# Patient Record
Sex: Male | Born: 1945 | Race: White | Hispanic: No | Marital: Married | Smoking: Never smoker
Health system: Southern US, Community
[De-identification: ages and names within clinical notes are randomized; demographics above are authoritative.]

## PROBLEM LIST (undated history)

## (undated) DIAGNOSIS — R339 Retention of urine, unspecified: Secondary | ICD-10-CM

## (undated) DIAGNOSIS — Z960 Presence of urogenital implants: Secondary | ICD-10-CM

## (undated) DIAGNOSIS — Z978 Presence of other specified devices: Secondary | ICD-10-CM

## (undated) DIAGNOSIS — G20A1 Parkinson's disease without dyskinesia, without mention of fluctuations: Secondary | ICD-10-CM

## (undated) DIAGNOSIS — Z973 Presence of spectacles and contact lenses: Secondary | ICD-10-CM

## (undated) DIAGNOSIS — I639 Cerebral infarction, unspecified: Secondary | ICD-10-CM

## (undated) DIAGNOSIS — N400 Enlarged prostate without lower urinary tract symptoms: Secondary | ICD-10-CM

## (undated) DIAGNOSIS — K219 Gastro-esophageal reflux disease without esophagitis: Secondary | ICD-10-CM

## (undated) DIAGNOSIS — G4762 Sleep related leg cramps: Secondary | ICD-10-CM

## (undated) HISTORY — PX: CATARACT EXTRACTION W/ INTRAOCULAR LENS  IMPLANT, BILATERAL: SHX1307

## (undated) HISTORY — PX: INGUINAL HERNIA REPAIR: SUR1180

## (undated) HISTORY — DX: Parkinson's disease without dyskinesia, without mention of fluctuations: G20.A1

---

## 1999-08-16 HISTORY — PX: OTHER SURGICAL HISTORY: SHX169

## 2000-07-17 ENCOUNTER — Ambulatory Visit (HOSPITAL_COMMUNITY): Admission: RE | Admit: 2000-07-17 | Discharge: 2000-07-17 | Payer: Self-pay | Admitting: General Surgery

## 2003-06-27 ENCOUNTER — Ambulatory Visit (HOSPITAL_COMMUNITY): Admission: RE | Admit: 2003-06-27 | Discharge: 2003-06-27 | Payer: Self-pay | Admitting: Gastroenterology

## 2013-08-29 HISTORY — PX: OTHER SURGICAL HISTORY: SHX169

## 2013-09-17 ENCOUNTER — Other Ambulatory Visit: Payer: Self-pay | Admitting: Urology

## 2013-09-17 ENCOUNTER — Encounter (HOSPITAL_BASED_OUTPATIENT_CLINIC_OR_DEPARTMENT_OTHER): Payer: Self-pay | Admitting: *Deleted

## 2013-09-17 NOTE — Progress Notes (Signed)
NPO AFTER MN. ARRIVE AT 0600. NEEDS HG. PT WITH RECENT LEFT EYE SURGERY WILL WEAR EYE PATCH DURING SURGERY. REVIEWED [facility] GUIDELINES, WILL BRING MEDS.

## 2013-09-22 ENCOUNTER — Encounter (HOSPITAL_BASED_OUTPATIENT_CLINIC_OR_DEPARTMENT_OTHER): Payer: Self-pay | Admitting: Anesthesiology

## 2013-09-22 NOTE — Anesthesia Preprocedure Evaluation (Signed)
Anesthesia Evaluation  Patient identified by MRN, date of birth, ID band Patient awake    Reviewed: Allergy & Precautions, H&P , NPO status , Patient's Chart, lab work & pertinent test results  Airway Mallampati: II TM Distance: >3 FB Neck ROM: Full    Dental no notable dental hx.    Pulmonary neg pulmonary ROS,  breath sounds clear to auscultation  Pulmonary exam normal       Cardiovascular Exercise Tolerance: Good negative cardio ROS  Rhythm:Regular Rate:Normal     Neuro/Psych negative neurological ROS  negative psych ROS   GI/Hepatic negative GI ROS, Neg liver ROS,   Endo/Other  negative endocrine ROS  Renal/GU negative Renal ROS  negative genitourinary   Musculoskeletal negative musculoskeletal ROS (+)   Abdominal   Peds negative pediatric ROS (+)  Hematology negative hematology ROS (+)   Anesthesia Other Findings   Reproductive/Obstetrics negative OB ROS                           Anesthesia Physical Anesthesia Plan  ASA: II  Anesthesia Plan: General   Post-op Pain Management:    Induction: Intravenous  Airway Management Planned: LMA  Additional Equipment:   Intra-op Plan:   Post-operative Plan: Extubation in OR  Informed Consent: I have reviewed the patients History and Physical, chart, labs and discussed the procedure including the risks, benefits and alternatives for the proposed anesthesia with the patient or authorized representative who has indicated his/her understanding and acceptance.   Dental advisory given  Plan Discussed with: CRNA  Anesthesia Plan Comments:         Anesthesia Quick Evaluation

## 2013-09-23 ENCOUNTER — Ambulatory Visit (HOSPITAL_BASED_OUTPATIENT_CLINIC_OR_DEPARTMENT_OTHER): Payer: Medicare Other | Admitting: Anesthesiology

## 2013-09-23 ENCOUNTER — Encounter (HOSPITAL_BASED_OUTPATIENT_CLINIC_OR_DEPARTMENT_OTHER): Payer: Self-pay | Admitting: *Deleted

## 2013-09-23 ENCOUNTER — Ambulatory Visit (HOSPITAL_BASED_OUTPATIENT_CLINIC_OR_DEPARTMENT_OTHER)
Admission: RE | Admit: 2013-09-23 | Discharge: 2013-09-24 | Disposition: A | Discharge status: Home / Self Care | Payer: Medicare Other | Source: Ambulatory Visit | Attending: Urology | Admitting: Urology

## 2013-09-23 ENCOUNTER — Encounter (HOSPITAL_BASED_OUTPATIENT_CLINIC_OR_DEPARTMENT_OTHER): Payer: Medicare Other | Admitting: Anesthesiology

## 2013-09-23 ENCOUNTER — Encounter (HOSPITAL_BASED_OUTPATIENT_CLINIC_OR_DEPARTMENT_OTHER)
Admission: RE | Disposition: A | Discharge status: Home / Self Care | Payer: Self-pay | Source: Ambulatory Visit | Attending: Urology

## 2013-09-23 DIAGNOSIS — Z79899 Other long term (current) drug therapy: Secondary | ICD-10-CM | POA: Insufficient documentation

## 2013-09-23 DIAGNOSIS — R338 Other retention of urine: Secondary | ICD-10-CM

## 2013-09-23 DIAGNOSIS — N138 Other obstructive and reflux uropathy: Secondary | ICD-10-CM | POA: Insufficient documentation

## 2013-09-23 DIAGNOSIS — R339 Retention of urine, unspecified: Secondary | ICD-10-CM | POA: Insufficient documentation

## 2013-09-23 DIAGNOSIS — N401 Enlarged prostate with lower urinary tract symptoms: Secondary | ICD-10-CM | POA: Diagnosis present

## 2013-09-23 DIAGNOSIS — K219 Gastro-esophageal reflux disease without esophagitis: Secondary | ICD-10-CM | POA: Insufficient documentation

## 2013-09-23 DIAGNOSIS — R972 Elevated prostate specific antigen [PSA]: Secondary | ICD-10-CM | POA: Insufficient documentation

## 2013-09-23 DIAGNOSIS — R351 Nocturia: Secondary | ICD-10-CM | POA: Insufficient documentation

## 2013-09-23 HISTORY — DX: Retention of urine, unspecified: R33.9

## 2013-09-23 HISTORY — DX: Presence of other specified devices: Z97.8

## 2013-09-23 HISTORY — DX: Presence of urogenital implants: Z96.0

## 2013-09-23 HISTORY — DX: Benign prostatic hyperplasia without lower urinary tract symptoms: N40.0

## 2013-09-23 HISTORY — DX: Presence of spectacles and contact lenses: Z97.3

## 2013-09-23 HISTORY — DX: Sleep related leg cramps: G47.62

## 2013-09-23 HISTORY — PX: TRANSURETHRAL RESECTION OF PROSTATE: SHX73

## 2013-09-23 LAB — POCT HEMOGLOBIN-HEMACUE: Hemoglobin: 15.1 g/dL (ref 13.0–17.0)

## 2013-09-23 SURGERY — TURP (TRANSURETHRAL RESECTION OF PROSTATE)
Anesthesia: General | Site: Prostate

## 2013-09-23 MED ORDER — FENTANYL CITRATE 0.05 MG/ML IJ SOLN
INTRAMUSCULAR | Status: AC
  Filled 2013-09-23: qty 2

## 2013-09-23 MED ORDER — BELLADONNA ALKALOIDS-OPIUM 16.2-60 MG RE SUPP
1.0000 | Freq: Four times a day (QID) | RECTAL | Status: DC | PRN
  Filled 2013-09-23: qty 1

## 2013-09-23 MED ORDER — SODIUM CHLORIDE 0.9 % IR SOLN
Freq: Once | Status: DC
  Filled 2013-09-23 (×2): qty 3000

## 2013-09-23 MED ORDER — BELLADONNA ALKALOIDS-OPIUM 16.2-60 MG RE SUPP
RECTAL | Status: DC | PRN
  Administered 2013-09-23: 1 via RECTAL

## 2013-09-23 MED ORDER — PROMETHAZINE HCL 25 MG/ML IJ SOLN
6.2500 mg | INTRAMUSCULAR | Status: DC | PRN
  Filled 2013-09-23: qty 1

## 2013-09-23 MED ORDER — ONDANSETRON HCL 4 MG/2ML IJ SOLN
INTRAMUSCULAR | Status: DC | PRN
  Administered 2013-09-23: 4 mg via INTRAVENOUS

## 2013-09-23 MED ORDER — SODIUM CHLORIDE 0.9 % IR SOLN
Status: DC | PRN
  Administered 2013-09-23: 6000 mL

## 2013-09-23 MED ORDER — SODIUM CHLORIDE 0.9 % IR SOLN
Status: DC | PRN
  Administered 2013-09-23: 12000 mL

## 2013-09-23 MED ORDER — SODIUM CHLORIDE 0.9 % IV SOLN
INTRAVENOUS | Status: DC
  Administered 2013-09-23 – 2013-09-24 (×2): via INTRAVENOUS
  Filled 2013-09-23: qty 1000

## 2013-09-23 MED ORDER — STERILE WATER FOR IRRIGATION IR SOLN
Status: DC | PRN
  Administered 2013-09-23: 30 mL

## 2013-09-23 MED ORDER — BELLADONNA ALKALOIDS-OPIUM 16.2-60 MG RE SUPP
RECTAL | Status: AC
  Filled 2013-09-23: qty 1

## 2013-09-23 MED ORDER — PROPOFOL 10 MG/ML IV BOLUS
INTRAVENOUS | Status: DC | PRN
  Administered 2013-09-23: 200 mg via INTRAVENOUS

## 2013-09-23 MED ORDER — HYDROCODONE-ACETAMINOPHEN 5-325 MG PO TABS
1.0000 | ORAL_TABLET | ORAL | Status: DC | PRN
  Filled 2013-09-23: qty 2

## 2013-09-23 MED ORDER — CIPROFLOXACIN IN D5W 400 MG/200ML IV SOLN
400.0000 mg | Freq: Two times a day (BID) | INTRAVENOUS | Status: AC
  Filled 2013-09-23: qty 200

## 2013-09-23 MED ORDER — BACITRACIN-NEOMYCIN-POLYMYXIN 400-5-5000 EX OINT
1.0000 "application " | TOPICAL_OINTMENT | Freq: Three times a day (TID) | CUTANEOUS | Status: DC | PRN
  Filled 2013-09-23: qty 1

## 2013-09-23 MED ORDER — CIPROFLOXACIN IN D5W 400 MG/200ML IV SOLN
400.0000 mg | INTRAVENOUS | Status: AC
Start: 2013-09-23 — End: 2013-09-23
  Administered 2013-09-23: 400 mg via INTRAVENOUS
  Filled 2013-09-23: qty 200

## 2013-09-23 MED ORDER — FENTANYL CITRATE 0.05 MG/ML IJ SOLN
25.0000 ug | INTRAMUSCULAR | Status: DC | PRN
  Filled 2013-09-23: qty 1

## 2013-09-23 MED ORDER — ACETAMINOPHEN 325 MG PO TABS
650.0000 mg | ORAL_TABLET | ORAL | Status: DC | PRN
  Filled 2013-09-23: qty 2

## 2013-09-23 MED ORDER — LIDOCAINE HCL (CARDIAC) 20 MG/ML IV SOLN
INTRAVENOUS | Status: DC | PRN
  Administered 2013-09-23: 80 mg via INTRAVENOUS

## 2013-09-23 MED ORDER — 0.9 % SODIUM CHLORIDE (POUR BTL) OPTIME
TOPICAL | Status: DC | PRN
  Administered 2013-09-23: 2000 mL

## 2013-09-23 MED ORDER — LACTATED RINGERS IV SOLN
INTRAVENOUS | Status: DC
  Administered 2013-09-23 (×2): via INTRAVENOUS
  Filled 2013-09-23: qty 1000

## 2013-09-23 MED ORDER — ACETAMINOPHEN 10 MG/ML IV SOLN
INTRAVENOUS | Status: DC | PRN
  Administered 2013-09-23: 1000 mg via INTRAVENOUS

## 2013-09-23 MED ORDER — DEXAMETHASONE SODIUM PHOSPHATE 4 MG/ML IJ SOLN
INTRAMUSCULAR | Status: DC | PRN
  Administered 2013-09-23: 10 mg via INTRAVENOUS

## 2013-09-23 MED ORDER — ONDANSETRON HCL 4 MG/2ML IJ SOLN
4.0000 mg | INTRAMUSCULAR | Status: DC | PRN
  Filled 2013-09-23: qty 2

## 2013-09-23 MED ORDER — FENTANYL CITRATE 0.05 MG/ML IJ SOLN
INTRAMUSCULAR | Status: DC | PRN
  Administered 2013-09-23: 50 ug via INTRAVENOUS
  Administered 2013-09-23 (×2): 25 ug via INTRAVENOUS

## 2013-09-23 MED ORDER — ZOLPIDEM TARTRATE 5 MG PO TABS
5.0000 mg | ORAL_TABLET | Freq: Every evening | ORAL | Status: DC | PRN
  Filled 2013-09-23: qty 1

## 2013-09-23 SURGICAL SUPPLY — 37 items
BAG DRAIN URO-CYSTO SKYTR STRL (DRAIN) ×2 IMPLANT
BAG URINE DRAINAGE (UROLOGICAL SUPPLIES) IMPLANT
BAG URINE LEG 19OZ MD ST LTX (BAG) IMPLANT
CANISTER SUCT LVC 12 LTR MEDI- (MISCELLANEOUS) ×4 IMPLANT
CATH FOLEY 3WAY 30CC 24FR (CATHETERS) ×1
CATH HEMA 3WAY 30CC 24FR COUDE (CATHETERS) IMPLANT
CATH HEMA 3WAY 30CC 24FR RND (CATHETERS) IMPLANT
CATH URTH STD 24FR FL 3W 2 (CATHETERS) ×1 IMPLANT
CLOTH BEACON ORANGE TIMEOUT ST (SAFETY) ×2 IMPLANT
DRAPE CAMERA CLOSED 9X96 (DRAPES) ×2 IMPLANT
ELECT BUTTON BIOP 24F 90D PLAS (MISCELLANEOUS) IMPLANT
ELECT BUTTON HF 24-28F 2 30DE (ELECTRODE) IMPLANT
ELECT LOOP HF 26F 30D .35MM (CUTTING LOOP) IMPLANT
ELECT LOOP MED HF 24F 12D (CUTTING LOOP) IMPLANT
ELECT LOOP MED HF 24F 12D CBL (CLIP) ×2 IMPLANT
ELECT REM PT RETURN 9FT ADLT (ELECTROSURGICAL) ×2
ELECTRODE REM PT RTRN 9FT ADLT (ELECTROSURGICAL) ×1 IMPLANT
EVACUATOR MICROVAS BLADDER (UROLOGICAL SUPPLIES) ×2 IMPLANT
GLOVE BIO SURGEON STRL SZ8 (GLOVE) ×2 IMPLANT
GLOVE BIOGEL M STER SZ 6 (GLOVE) ×2 IMPLANT
GLOVE BIOGEL PI IND STRL 6.5 (GLOVE) ×1 IMPLANT
GLOVE BIOGEL PI INDICATOR 6.5 (GLOVE) ×1
GOWN PREVENTION PLUS LG XLONG (DISPOSABLE) ×4 IMPLANT
GOWN STRL REIN XL XLG (GOWN DISPOSABLE) IMPLANT
GOWN STRL REUS W/TWL LRG LVL3 (GOWN DISPOSABLE) ×2 IMPLANT
GOWN STRL REUS W/TWL XL LVL3 (GOWN DISPOSABLE) ×2 IMPLANT
HOLDER FOLEY CATH W/STRAP (MISCELLANEOUS) ×2 IMPLANT
IV NS 1000ML (IV SOLUTION) ×4
IV NS 1000ML BAXH (IV SOLUTION) ×4 IMPLANT
IV NS IRRIG 3000ML ARTHROMATIC (IV SOLUTION) ×2 IMPLANT
LOOP CUTTING 24FR OLYMPUS (CUTTING LOOP) IMPLANT
PACK CYSTOSCOPY (CUSTOM PROCEDURE TRAY) ×2 IMPLANT
PLUG CATH AND CAP STER (CATHETERS) IMPLANT
SET ASPIRATION TUBING (TUBING) ×2 IMPLANT
SYR 30ML LL (SYRINGE) ×2 IMPLANT
SYRINGE IRR TOOMEY STRL 70CC (SYRINGE) ×2 IMPLANT
WATER STERILE IRR 3000ML UROMA (IV SOLUTION) ×2 IMPLANT

## 2013-09-23 NOTE — Transfer of Care (Signed)
Immediate Anesthesia Transfer of Care Note  Patient: Joshua Browning  Procedure(s) Performed: Procedure(s): TRANSURETHRAL RESECTION OF THE PROSTATE (TURP) WITH GYRUS (N/A)  Patient Location: PACU  Anesthesia Type:General  Level of Consciousness: sedated and responds to stimulation  Airway & Oxygen Therapy: Patient Spontanous Breathing and Patient connected to nasal cannula oxygen  Post-op Assessment: Report given to PACU RN  Post vital signs: Reviewed and stable  Complications: No apparent anesthesia complications

## 2013-09-23 NOTE — H&P (Signed)
Joshua Browning is a 68 year old male with urinary retention.   History of Present Illness      Gross hematuria: He noted blood spotting in his underwear. He describes the bloody spot as almost a bloody, mucoid type material that is about the size of a nickel. He indicates that he has not had an ejaculation so does not know if he has any blood in his semen. He said it only occurs at night. He's never seen any grossly bloody urine and denies any flank pain, kidney stones or history of UTIs or prostatitis.    History of BPH with outlet obstruction: He started Cardura for this in 1996. He then switched to Flomax and eventually to Rapaflo in 2011. He was voiding without difficulty until approximately 1/13 when he began to have nocturia 2-4 times. When he started the Rapaflo he said he was only getting up once at night and now that has increased.   Cystoscopy 6/13 - No abnormality of the urethra. The prostatic urethra was elongated with trilobar hypertrophy and a generous median lobe component as well. The bladder was highly trabeculated.  Current treatment: Rapaflo 8 mg    LUTS: He began experiencing frequency and urgency in 12/13. At time he was on oxybutynin 5 mg b.i.d. I switched him to Vesicare 10 mg.    Elevated PSA: He had a transient elevation of his PSA which increased from 2.93 in 12/10 to 3.66 in 1/12. He was rechecked in 7/12 and had fallen to 2.24.    Interval history: He has had a Foley catheter in and has undergone urodynamics to evaluate his bladder further.     He was seen on 08/20/13 for routine yearly visit and reported the Vesicare seemed to have lost its effectiveness over the past few months. He had daytime frequency every 1.5-2 hours with nocturia 5-6 times and despite feeling as if he emptied completely his PVR was found to be 952 cc. An ultrasound was obtained which revealed right hydronephrosis. The catheter was placed and 1230 cc drained from the bladder. His Vesicare was  stopped and he was maintained on Rapaflo. A PSA drawn on 08/13/13 was normal at 1.62.       Past Medical History Problems  1. History of Elevated prostate specific antigen (PSA) (790.93) 2. History of esophageal reflux (V12.79)  Surgical History Problems  1. History of Cataract Surgery 2. History of Eye Surgery 3. History of Inguinal Hernia Repair 4. History of Inguinal Hernia Repair  Current Meds 1. Magnesium Oxide 400 MG Oral Tablet;  Therapy: (P2138233) to Recorded 2. Multi-Day TABS;  Therapy: (Recorded:04Jun2013) to Recorded 3. PrednisoLONE Acetate 1 % Ophthalmic Suspension;  Therapy: (P2138233) to Recorded 4. PriLOSEC 20 MG Oral Capsule Delayed Release;  Therapy: (Recorded:04Jun2013) to Recorded 5. Rapaflo 8 MG Oral Capsule;  Therapy: (Recorded:04Jun2013) to Recorded 6. Vitamin D TABS;  Therapy: (Recorded:04Jun2013) to Recorded  Allergies Medication  1. No Known Drug Allergies  Family History Problems  1. Family history of Diabetes Mellitus (V18.0) 2. Family history of Heart Disease (V17.49) 3. No pertinent family history : Mother  Social History Problems  1. Denied: History of Alcohol Use 2. Caffeine Use 3. Marital History - Currently Married 4. Never A Smoker 5. Occupation: Retired  Electronics engineer, constitutional, skin, eye, otolaryngeal, hematologic/lymphatic, cardiovascular, pulmonary, endocrine, musculoskeletal, gastrointestinal, neurological and psychiatric system(s) were reviewed and pertinent findings if present are noted.  Genitourinary: urinary frequency, feelings of urinary urgency, nocturia, difficulty starting the urinary stream, weak urinary  stream and erectile dysfunction.  Gastrointestinal: heartburn.  Musculoskeletal: joint pain.    Vitals Vital Signs Height: 5 ft 11 in Weight: 172 lb  BMI Calculated: 23.99 BSA Calculated: 1.98 Blood Pressure: 133 / 87 Heart Rate: 77  Physical  Exam Constitutional: Well nourished and well developed . No acute distress.  ENT:. The ears and nose are normal in appearance.  Neck: The appearance of the neck is normal and no neck mass is present.  Pulmonary: No respiratory distress and normal respiratory rhythm and effort.  Cardiovascular: Heart rate and rhythm are normal . No peripheral edema.  Abdomen: The abdomen is soft and nontender. No masses are palpated. No CVA tenderness. No hernias are palpable. No hepatosplenomegaly noted.  Rectal: Rectal exam demonstrates normal sphincter tone, no tenderness and no masses. Estimated prostate size is 3+. The prostate has a palpable nodule involving the left of the prostate and is not tender. The left seminal vesicle is nonpalpable. The right seminal vesicle is nonpalpable. The perineum is normal on inspection.  Genitourinary: Examination of the penis demonstrates no discharge with foley catheter indwelling, no masses, no lesions. The penis is circumcised. The scrotum is without lesions. The right epididymis is palpably normal and non-tender. The left epididymis is palpably normal and non-tender. The right testis is non-tender and without masses. The left testis is non-tender and without masses.  Lymphatics: The femoral and inguinal nodes are not enlarged or tender.  Skin: Normal skin turgor, no visible rash and no visible skin lesions.  Neuro/Psych:. Mood and affect are appropriate.   Results/Data Urodynamics 09/10/13: Study was performed to evaluate urinary retention and an elevated bladder capacity.  His full urodynamic study was reviewed in its entirety as noted in the chart.    CMG: In the maximum cystometric capacity of 667 cc with some slight loss of compliance. He had an end filling pressure of 18 cm H2O, a normal first sensation at 291 cc and detrusor instability initially noted at 388 cc with an unstable contraction pressure of 28 cm H2O. No leakage occurred.  Pressure-flow: He was able  to generate a voluntary contraction but was unable to void with a maximum detrusor pressure of 62 cm H2O. His voluntary contractions were not well sustained.  EMG: He did have some increase in EMG activity during his attempt to void.  Fluoroscopy: He had fairly significant trabeculation with cellule formation.    Impression: He has an elevated capacity with some instability and loss of compliance and a poorly sustained detrusor contraction that was up to 62 cm H2O and appeared he was voiding against a closed bladder neck. He has evidence of long-standing outlet obstruction. He very well may respond to outlet resistance surgery although he is at an increased risk for developing urge incontinence.     Assessment Assessed  1. Incomplete bladder emptying (788.21)  We went over the results of the urodynamics study today. I discussed each of the findings of the study and the normal/anticipated results as well as the significance of these results in relation to the subjective symptoms.  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 He did have uninhibited contractions which have been managed with anticholinergic therapy in the past. We discussed the treatment options and he does not want to maintain catheter in the bladder either through the penis or suprapubic. He also does  not want to perform intermittent self-catheterization. I therefore discussed transurethral resection of the prostate with him. I have gone over the procedure with detail and drew him pictures. I have discussed with he and his wife the fact that he has a detrusor contraction and very likely will void spontaneously. There is a small risk that he may not void and required a catheter for some time after the surgery. The greater risk is that of urge incontinence developing after I reduce his outlet  resistance. We discussed using medication to manage this if it occurs although I think the likelihood of that is low as well. I went over the need to stay in the hospital overnight, the probability of success and the anticipated postoperative hospital course. All of his and his wife's questions were answered to their satisfaction and he has elected to proceed with surgery.   Plan 1. A urine specimen was obtained from his catheter on 09/12/13 for culture and was negative.  2. He'll be scheduled for transurethral resection of his prostate.  3. He just underwent left eye surgery and will need a protective eye shield in place at the time of surgery.

## 2013-09-23 NOTE — Anesthesia Procedure Notes (Signed)
Procedure Name: LMA Insertion Date/Time: 09/23/2013 7:28 AM Performed by: Bethena Roys T Pre-anesthesia Checklist: Patient identified, Emergency Drugs available, Suction available and Patient being monitored Patient Re-evaluated:Patient Re-evaluated prior to inductionOxygen Delivery Method: Circle System Utilized Preoxygenation: Pre-oxygenation with 100% oxygen Intubation Type: IV induction Ventilation: Mask ventilation without difficulty LMA: LMA inserted LMA Size: 5.0 Number of attempts: 1 Airway Equipment and Method: bite block Placement Confirmation: positive ETCO2 Dental Injury: Teeth and Oropharynx as per pre-operative assessment

## 2013-09-23 NOTE — Op Note (Signed)
PATIENT:  Joshua Browning  PRE-OPERATIVE DIAGNOSIS: BPH with urinary retention  POST-OPERATIVE DIAGNOSIS: Same  PROCEDURE: TURP  SURGEON:  Claybon Jabs  INDICATION: Joshua Browning is a 68 year old male who developed urinary retention and was found to have 1230 cc in the bladder. A Foley catheter was placed and he was maintained on alpha blockade therapy but continued to be unable to void. He underwent urodynamic evaluation which revealed some detrusor instability but also significant outlet obstruction. We discussed the treatment options and he would like to be catheter free. We therefore discussed transurethral resection of the prostate with the understanding that he may have some bladder overactivity that may require anticholinergic therapy postoperatively.  ANESTHESIA:  General  EBL:  Minimal  DRAINS: 79 [place], three-way Foley catheter  LOCAL MEDICATIONS USED:  None  SPECIMEN:  Prostate chips to pathology  Description of procedure: After informed consent the patient was taken to the operating room and placed on the table in a supine position. General anesthesia was then administered. Once fully anesthetized the patient was moved to the dorsal lithotomy position and the genitalia were sterilely prepped and draped in standard fashion. An official timeout was then performed.  The 100 French resectoscope with visual obturator and 12 lens was then passed under direct vision down the urethra which was noted be normal. The prostatic urethra revealed trilobar hypertrophy. The bladder was entered and fully inspected and noted to be free of any tumors, stones or inflammatory lesions. It did have significant trabeculation with cellule formation. The ureteral orifices were noted to be well away from the bladder neck.  The resectoscope element was then inserted and I began resecting the prostate from the level of the bladder neck back to the veru first at the 6:00 position. I then resected the  right and left lobes inferiorly. The remainder of the left lobe was then resected down to the surgical capsule with electrocautery used to control bleeding points. He had a highly vascular prostate. The right lobe was then resected in a similar fashion and I resected anterior tissue as well as apical tissue with care being taken to remain proximal to the purulent all times. The Microvasive evacuator was then used to remove all prostatic chips from the bladder and the bladder reinspected. All chips had been removed, the bladder was completely intact and ureteral orifices were identified and remained well away from the area of resection. I therefore removed the resectoscope and inserted a 20 French Foley catheter. This was irrigated with mild traction and the irrigant returned light pink. It was placed on traction and maintained on continuous bladder irrigation and the patient received a B&O suppository. He was then awakened and taken to the recovery room in stable and satisfactory condition. He tolerated the procedure well no intraoperative complications.  PLAN OF CARE: Observation overnight with anticipated discharge in the morning.  PATIENT DISPOSITION:  PACU - hemodynamically stable.

## 2013-09-23 NOTE — Anesthesia Postprocedure Evaluation (Signed)
  Anesthesia Post-op Note  Patient: Joshua Browning  Procedure(s) Performed: Procedure(s) (LRB): TRANSURETHRAL RESECTION OF THE PROSTATE (TURP) WITH GYRUS (N/A)  Patient Location: PACU  Anesthesia Type: General  Level of Consciousness: awake and alert   Airway and Oxygen Therapy: Patient Spontanous Breathing  Post-op Pain: mild  Post-op Assessment: Post-op Vital signs reviewed, Patient's Cardiovascular Status Stable, Respiratory Function Stable, Patent Airway and No signs of Nausea or vomiting  Last Vitals:  Filed Vitals:   09/23/13 0951  BP: 112/71  Pulse: 61  Temp: 36.1 C  Resp: 16    Post-op Vital Signs: stable   Complications: No apparent anesthesia complications

## 2013-09-23 NOTE — Progress Notes (Signed)
Received into RCC#1 via stretcher.  Transferred to bed w/ assistance.  Pt a&o. Vss.  Oriented to room and unit. Guidelines reviewed.  Call bell w/in reach.  Orders reviewed w pt.

## 2013-09-23 NOTE — Progress Notes (Signed)
Night of surgery note  Subjective: The patient is doing well.  No complaints. In fact he was quite surprised that he was not having any pain from the surgery.  Objective: Vital signs in last 24 hours: Temp:  [96.8 F (36 C)-97 F (36.1 C)] 97 F (36.1 C) (02/09 1157) Pulse Rate:  [61-74] 74 (02/09 1157) Resp:  [9-21] 14 (02/09 1157) BP: (96-134)/(58-84) 96/58 mmHg (02/09 1157) SpO2:  [96 %-100 %] 97 % (02/09 1157) Weight:  [78.019 kg (172 lb)] 78.019 kg (172 lb) (02/09 0617)    Intake/Output this shift: Total I/O In: 1610 [P.O.:720; I.V.:1300; Other:1400] Out: 2050 [Urine:2050]  Physical Exam:  His abdomen is soft, flat and nontender.  There are no masses. His catheter is on mild traction and draining pink urine without clots.  Lab Results:  Recent Labs  09/23/13 0637  HGB 15.1    Assessment/Plan: 1) Continue to monitor 2) Per orders   Carlean Crowl C. Karsten Ro, MD  Claybon Jabs 09/23/2013, 2:03 PM

## 2013-09-23 NOTE — Discharge Instructions (Signed)

## 2013-09-24 ENCOUNTER — Encounter (HOSPITAL_BASED_OUTPATIENT_CLINIC_OR_DEPARTMENT_OTHER): Payer: Self-pay | Admitting: Urology

## 2013-09-24 LAB — HEMOGLOBIN AND HEMATOCRIT, BLOOD
HCT: 38.4 % — ABNORMAL LOW (ref 39.0–52.0)
Hemoglobin: 13.1 g/dL (ref 13.0–17.0)

## 2013-09-24 MED ORDER — PHENAZOPYRIDINE HCL 200 MG PO TABS: 200.0000 mg | ORAL_TABLET | Freq: Three times a day (TID) | ORAL | Status: DC | PRN

## 2013-09-24 NOTE — Discharge Summary (Signed)
Physician Discharge Summary  Patient ID: Joshua Browning MRN: [id number] DOB/AGE: 68-Oct-1947 68 y.o.  Admit date: 09/23/2013 Discharge date: 09/24/2013  Admission Diagnoses: BPH WITH URINARY RETENTION  Discharge Diagnoses:  Principal Problem:   BPH (benign prostatic hypertrophy) with urinary retention   Discharged Condition: good  Hospital Course: Developing urinary retention and not be able to void despite alpha blockade therapy he underwent urodynamics. This revealed he did have an adequate detrusor contraction with outlet obstruction and therefore he was admitted for elective transurethral resection of his prostate. This was undertaken and tolerated well with no complications. He was maintained on continuous bladder irrigation overnight and his urine cleared. His catheter has been removed. His hemoglobin has decreased just slightly however there is no active bleeding and this is most likely secondary to some intraoperative blood loss as well as dilution with hydration. At this point he is felt to be ready for discharge. He is having no pain.  Consults: None  Significant Diagnostic Studies: No results found.   Discharge Exam: Blood pressure 107/65, pulse 72, temperature 97.3 F (36.3 C), temperature source Oral, resp. rate 18, height 5' 10.25" (1.784 m), weight 78.019 kg (172 lb), SpO2 96.00%. General appearance: alert, cooperative and no distress GI: normal findings: no masses palpable Male genitalia: normal  Disposition: Final discharge disposition not confirmed  Discharge Orders   Future Orders Complete By Expires   Discharge patient  As directed        Medication List    STOP taking these medications       silodosin 4 MG Caps capsule  Commonly known as:  RAPAFLO      TAKE these medications       Magnesium 100 MG Caps  Take 1 capsule by mouth every evening.     omeprazole 20 MG capsule  Commonly known as:  PRILOSEC  Take 20 mg by mouth every evening.     phenazopyridine 200 MG tablet  Commonly known as:  PYRIDIUM  Take 1 tablet (200 mg total) by mouth 3 (three) times daily as needed for pain.     VITABASIC SENIOR PO  Take 1 tablet by mouth.     vitamin B-12 1000 MCG tablet  Commonly known as:  CYANOCOBALAMIN  Take 1,000 mcg by mouth every evening.     Vitamin D3 1000 UNITS Caps  Take 1 capsule by mouth every evening.           Follow-up Information   Follow up with Claybon Jabs, MD On 10/01/2013. (at 9:30)    Specialty:  Urology   Contact information:   [facility]  [place] [address] [phone]       Signed: Claybon Jabs 09/24/2013, 7:14 AM

## 2013-09-24 NOTE — Progress Notes (Signed)
Traction for foley removed at 0500

## 2013-09-24 NOTE — Progress Notes (Signed)
Patient very anxious about removing catheter.  Attempted to help ease that worry.

## 2014-07-16 ENCOUNTER — Encounter (HOSPITAL_BASED_OUTPATIENT_CLINIC_OR_DEPARTMENT_OTHER): Payer: Self-pay | Admitting: Urology

## 2015-11-30 ENCOUNTER — Ambulatory Visit (INDEPENDENT_AMBULATORY_CARE_PROVIDER_SITE_OTHER): Payer: Medicare Other | Admitting: Neurology

## 2015-11-30 ENCOUNTER — Other Ambulatory Visit: Payer: Medicare Other

## 2015-11-30 ENCOUNTER — Encounter: Payer: Self-pay | Admitting: Neurology

## 2015-11-30 VITALS — BP 100/70 | HR 66 | Ht 70.0 in | Wt 176.0 lb

## 2015-11-30 DIAGNOSIS — R251 Tremor, unspecified: Secondary | ICD-10-CM

## 2015-11-30 DIAGNOSIS — R270 Ataxia, unspecified: Secondary | ICD-10-CM

## 2015-11-30 DIAGNOSIS — G609 Hereditary and idiopathic neuropathy, unspecified: Secondary | ICD-10-CM | POA: Diagnosis not present

## 2015-11-30 DIAGNOSIS — R413 Other amnesia: Secondary | ICD-10-CM | POA: Diagnosis not present

## 2015-11-30 LAB — COMPREHENSIVE METABOLIC PANEL
ALK PHOS: 50 U/L (ref 40–115)
ALT: 22 U/L (ref 9–46)
AST: 21 U/L (ref 10–35)
Albumin: 4.5 g/dL (ref 3.6–5.1)
BUN: 18 mg/dL (ref 7–25)
CO2: 25 mmol/L (ref 20–31)
CREATININE: 0.88 mg/dL (ref 0.70–1.25)
Calcium: 9.2 mg/dL (ref 8.6–10.3)
Chloride: 106 mmol/L (ref 98–110)
Glucose, Bld: 97 mg/dL (ref 65–99)
Potassium: 5 mmol/L (ref 3.5–5.3)
SODIUM: 141 mmol/L (ref 135–146)
TOTAL PROTEIN: 6.9 g/dL (ref 6.1–8.1)
Total Bilirubin: 0.8 mg/dL (ref 0.2–1.2)

## 2015-11-30 LAB — TSH: TSH: 1.11 mIU/L (ref 0.40–4.50)

## 2015-11-30 LAB — VITAMIN B12: VITAMIN B 12: 466 pg/mL (ref 200–1100)

## 2015-11-30 LAB — FOLATE: Folate: 18.8 ng/mL (ref >5.4)

## 2015-11-30 NOTE — Patient Instructions (Addendum)
1. Your provider has requested that you have labwork completed today. Please go to [facility] ([id number]) on the second floor of this building before leaving the office today. You do not need to check in. If you are not called within 15 minutes please check with the front desk.  2. We have scheduled you at [place] for your MRI on 12/08/2015 at 12:40 pm. Please arrive 20 minutes prior and go to [place]. If you need to change this appt please call [phone].

## 2015-11-30 NOTE — Progress Notes (Signed)
Joshua Browning was seen today in the movement disorders clinic for neurologic consultation at the request of  Joshua Crutch, MD.  The consultation is for the evaluation of tremor and to r/o PD.  This patient is accompanied in the office by his spouse who supplements the history.  Tremor began about 5 years ago and involves both hands.  Tremor is most evident when he is picking up a fork or pencil or trying to hold a book.  He is R hand dominant but both hands seem to shake equally.  He thinks that it has changed/increased a little over the years.  Tremor: Yes.     Affected by caffeine:  No. (drinks 3 cups coffee/day)  Affected by alcohol:  Doesn't drink  Affected by stress:  No.  Affected by fatigue:  Yes.    Spills soup if on spoon:  No.  Spills glass of liquid if full:  No.  Affects ADL's (tying shoes, brushing teeth, etc):  No.  Family hx of similar:  Yes.    (oldest son, father)  Other Specific Symptoms:  Voice: no change Sleep: sleeps well  Vivid Dreams:  No.  Acting out dreams:  No. Wet Pillows: Yes.   Postural symptoms:  Yes.   (trouble walking in straight line; trouble squatting as falls back if does that)  Falls?  No. Bradykinesia symptoms: difficulty getting out of a chair and bends knees with walking Loss of smell:  No. Loss of taste:  No. Urinary Incontinence:  No. (had issues but had TURP and better) Difficulty Swallowing:  No. Handwriting, micrographia: No. Depression:  No. (but wife states that a little irritable) Memory changes:  Yes.  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 Lightheaded:  No.  Syncope: No. Diplopia:  No. Dyskinesia:  No.  Neuroimaging has previously been performed.  It is not available for my review today.  States that it was done in 2003 for c/o memory change.  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  Saw psychiatry for years  but pt isn't convinced that depression an issue.    ALLERGIES:  No Known Allergies  CURRENT MEDICATIONS:  Outpatient Encounter Prescriptions as of 11/30/2015  Medication Sig  . Cholecalciferol (VITAMIN D3) 1000 UNITS CAPS Take 1 capsule by mouth every evening.  Marland Kitchen FLUoxetine (PROZAC) 20 MG capsule Take 20 mg by mouth daily.  . Glucosamine Sulfate 1000 MG CAPS Take by mouth 2 (two) times daily.  . Magnesium 100 MG CAPS Take 1 capsule by mouth every evening.  . Multiple Vitamins-Minerals (VITABASIC SENIOR PO) Take 1 tablet by mouth.  Marland Kitchen omeprazole (PRILOSEC) 20 MG capsule Take 20 mg by mouth every evening.  . ranitidine (ZANTAC) 150 MG tablet Take 150 mg by mouth 2 (two) times daily.  . vitamin B-12 (CYANOCOBALAMIN) 1000 MCG tablet Take 1,000 mcg by mouth every evening.  . [DISCONTINUED] phenazopyridine (PYRIDIUM) 200 MG tablet Take 1 tablet (200 mg total) by mouth 3 (three) times daily as needed for pain.   No facility-administered encounter medications on file as of 11/30/2015.    PAST MEDICAL HISTORY:   Past Medical History  Diagnosis Date  . BPH (benign prostatic hyperplasia)   . Urinary retention   . Foley catheter in place   . Wears glasses   . Nocturnal leg cramps     PAST SURGICAL HISTORY:  Past Surgical History  Procedure Laterality Date  . Cataract extraction w/ intraocular lens  implant, bilateral Bilateral   . Inguinal hernia repair Bilateral RIGHT 1984/  LEFT 2005  . Removal left eye intraocular lens and replacement   08-29-2013  . Re-do right inguinal hernia repair and umbilical hernia repair  2001  . Transurethral resection of prostate N/A 09/23/2013    Procedure: TRANSURETHRAL RESECTION OF THE PROSTATE (TURP) WITH GYRUS;  Surgeon: Claybon Jabs, MD;  Location: [facility];  Service: Urology;  Laterality: N/A;    SOCIAL HISTORY:   Social History   Social History  . Marital Status: Married    Spouse Name: N/A  . Number of Children: N/A  . Years  of Education: N/A   Occupational History  . Not on file.   Social History Main Topics  . Smoking status: Never Smoker   . Smokeless tobacco: Never Used  . Alcohol Use: No  . Drug Use: No  . Sexual Activity: Not on file   Other Topics Concern  . Not on file   Social History Narrative    FAMILY HISTORY:   Family Status  Relation Status Death Age  . Mother Deceased     CHF  . Father Deceased     DM, heart attack  . Son Alive     healthy  . Son Alive     healthy  . Son Alive     healthy    ROS:  A complete 10 system review of systems was obtained and was unremarkable apart from what is mentioned above.  PHYSICAL EXAMINATION:    VITALS:   Filed Vitals:   11/30/15 0852  BP: 100/70  Pulse: 66  Height: 5\' 10"  (1.778 m)  Weight: 176 lb (79.833 kg)    GEN:  The patient appears stated age and is in NAD. HEENT:  Normocephalic, atraumatic.  The mucous membranes are moist. The superficial temporal arteries are without ropiness or tenderness. CV:  RRR Lungs:  CTAB Neck/HEME:  There are no carotid bruits bilaterally.  Neurological examination:  Orientation: The patient is alert and oriented x3. Fund of knowledge is appropriate.  Recent and remote memory are intact.  Attention and concentration are normal.    Able to name objects and repeat phrases.He does look to his wife for the finer details of the history. Cranial nerves: There is good facial symmetry. There is mild facial hypomimia.  Pupil on the L is slightly irregular from prior surgery.  . Fundoscopic exam reveals clear margins bilaterally. Extraocular muscles are intact. The visual fields are full to confrontational testing. The speech is fluent and clear.  He does have some occasional word finding trouble.  He is mildly hypophonic.   Soft palate rises symmetrically and there is no tongue deviation. Hearing is intact to conversational tone. Sensation: Sensation is intact to light and pinprick throughout (facial, trunk,  extremities). Vibration is decreased at the bilateral big toe. There is no extinction with double simultaneous stimulation. There is no sensory dermatomal level identified. Motor: Strength is 5/5 in the bilateral upper and lower extremities.   Shoulder shrug is equal and symmetric.  There is no pronator drift. Deep tendon reflexes: Deep tendon reflexes are 2+/4 at the bilateral biceps, triceps, brachioradialis, 3- at the bilateral patella and 2/4 at the bilateral achilles. Plantar responses are downgoing bilaterally.  Movement examination: Tone: There is normal tone in the bilateral upper extremities.  The tone in the lower extremities is normal.  Abnormal movements: There is a mild resting tremor of the R>LUE.  He has minimal tremor of the outstretched hands that does increase with intention.  Doesn't spill water when asked to pour it from one glass to another.  Has mild head tremor in "yes" direction. Coordination:  There is no decremation with RAM's, with any form of RAMS, including alternating supination and pronation of the forearm, hand opening and closing, finger taps, heel taps and toe taps. Gait and Station: The patient has minimal difficulty arising out of a deep-seated chair without the use of the hands. The patient's stride length is normal but he has stooped posture and has increase in RUE resting tremor with ambulation.  ASSESSMENT/PLAN:  1.  Tremor  -I had a long discussion with the patient and his wife.  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  He does not yet, however, meet the [place] brain bank criteria for the diagnosis of idiopathic Parkinson's disease.  He may be in the preclinical phases.  He does not have significant bradykinesia.  I discussed this with the patient and his wife.  I discussed the need to follow him closely on a clinical basis.  I would like to see him at least every 6 months, but wanted to see him earlier if things  should change.  He and his wife were agreeable.  -We will proceed with an MRI of the brain.  He has had neuroimaging in the past showing atrophy, and I told him that he likely has more atrophy now and likely has some small vessel disease.  I just want to make sure where not missing anything else. 2.  Gait instability  -I think that this is multifactorial.  He has some evidence of parkinsonism.  He also has evidence of peripheral neuropathy on examination.  We will do some lab work.  He will have TSH, B12, folate, RPR, SPEP/UPEP with immunofixation and a chemistry.  He does take a B12 supplement, so his B12 will likely be on the high end.  We talked about safety. 3.  Memory change  -He has complained about memory loss for a long time and his wife thinks perhaps he had neuropsych testing 10-15 years ago and was told that he had depression (although they did not buy this diagnosis).  I am concerned that perhaps he has some neurodegenerative dementia now.  We will schedule him for neuropsych testing. 4.  I will plan on seeing him in 6 months, sooner should new neurologic issues arise.  Much greater than 50% of this 60 minute visit was spent in counseling and coordinating care.

## 2015-12-01 LAB — RPR

## 2015-12-02 LAB — PROTEIN ELECTROPHORESIS,RANDOM URN
Creatinine, Urine: 80 mg/dL (ref 20–370)
PROTEIN CREATININE RATIO: 75 mg/g{creat} (ref 22–128)
TOTAL PROTEIN, URINE: 6 mg/dL (ref 5–25)

## 2015-12-02 LAB — PROTEIN ELECTROPHORESIS, SERUM
ALBUMIN ELP: 4.4 g/dL (ref 3.8–4.8)
ALPHA-1-GLOBULIN: 0.2 g/dL (ref 0.2–0.3)
Alpha-2-Globulin: 0.7 g/dL (ref 0.5–0.9)
BETA 2: 0.3 g/dL (ref 0.2–0.5)
BETA GLOBULIN: 0.4 g/dL (ref 0.4–0.6)
Gamma Globulin: 0.8 g/dL (ref 0.8–1.7)
Total Protein, Serum Electrophoresis: 6.9 g/dL (ref 6.1–8.1)

## 2015-12-02 LAB — IMMUNOFIXATION ELECTROPHORESIS
IGG (IMMUNOGLOBIN G), SERUM: 1060 mg/dL (ref 650–1600)
IgA: 141 mg/dL (ref 68–379)
IgM, Serum: 57 mg/dL (ref 41–251)

## 2015-12-02 LAB — IMMUNOFIXATION INTE

## 2015-12-03 ENCOUNTER — Telehealth: Payer: Self-pay | Admitting: Neurology

## 2015-12-03 NOTE — Telephone Encounter (Signed)
-----   Message from [place], DO sent at 12/03/2015  7:56 AM EDT ----- Let pt know that labs are good

## 2015-12-03 NOTE — Telephone Encounter (Signed)
Patient made aware.

## 2015-12-08 ENCOUNTER — Ambulatory Visit
Admission: RE | Admit: 2015-12-08 | Discharge: 2015-12-08 | Disposition: A | Discharge status: Home / Self Care | Payer: Medicare Other | Source: Ambulatory Visit | Attending: Neurology | Admitting: Neurology

## 2015-12-08 ENCOUNTER — Telehealth: Payer: Self-pay | Admitting: Neurology

## 2015-12-08 DIAGNOSIS — R251 Tremor, unspecified: Secondary | ICD-10-CM

## 2015-12-08 DIAGNOSIS — R270 Ataxia, unspecified: Secondary | ICD-10-CM

## 2015-12-08 DIAGNOSIS — R413 Other amnesia: Secondary | ICD-10-CM

## 2015-12-08 NOTE — Telephone Encounter (Signed)
-----   Message from [place], DO sent at 12/08/2015  3:22 PM EDT ----- At least moderate small vessel disease.  Luvenia Starch, you can let pt know that nothing acute on the MRI brain and that there is some "white matter disease" (hardening of arteries in brain) but that is all chronic

## 2015-12-08 NOTE — Telephone Encounter (Signed)
Patient made aware of results.  

## 2015-12-17 ENCOUNTER — Telehealth: Payer: Self-pay | Admitting: Neurology

## 2015-12-17 NOTE — Telephone Encounter (Signed)
Pt. Returned call to schedule with Dr. Si Raider.  He said he wanted to wait on testing until he saw Dr. Carles Collet for his follow up.

## 2015-12-22 ENCOUNTER — Other Ambulatory Visit: Payer: Self-pay | Admitting: Internal Medicine

## 2016-05-27 NOTE — Progress Notes (Signed)
Joshua Browning was seen today in the movement disorders clinic for neurologic consultation at the request of  Melinda Crutch, MD.  The consultation is for the evaluation of tremor and to r/o PD.  This patient is accompanied in the office by his spouse who supplements the history.  Tremor began about 5 years ago and involves both hands.  Tremor is most evident when he is picking up a fork or pencil or trying to hold a book.  He is R hand dominant but both hands seem to shake equally.  He thinks that it has changed/increased a little over the years.  05/30/16 update:  Pt f/u today, accompanied by his wife who supplements the history.  He had an MRI of the brain since last visit that I did review. This was done on 12/08/15.  There was at least moderate small vessel disease.  No falls since our last visit.  His balance has been worse.  He feels that he wants to fall backwards, especially on the stairs.  Notes some tremor, worse with anxiety.  Both at rest and with holding something.  Was supposed to have neuropsych testing since last visit but when called to schedule it, he declined it.  States that he was confused as to why we wanted to schedule it.    ALLERGIES:  No Known Allergies  CURRENT MEDICATIONS:  Outpatient Encounter Prescriptions as of 05/31/2016  Medication Sig  . Cholecalciferol (VITAMIN D3) 1000 UNITS CAPS Take 1 capsule by mouth every evening.  Marland Kitchen FLUoxetine (PROZAC) 20 MG capsule Take 20 mg by mouth daily.  . Glucosamine Sulfate 1000 MG CAPS Take by mouth 2 (two) times daily.  . Magnesium 100 MG CAPS Take 1 capsule by mouth every evening.  . Multiple Vitamins-Minerals (VITABASIC SENIOR PO) Take 1 tablet by mouth.  Marland Kitchen omeprazole (PRILOSEC) 20 MG capsule Take 20 mg by mouth every evening.  . ranitidine (ZANTAC) 150 MG tablet Take 150 mg by mouth 2 (two) times daily.  . vitamin B-12 (CYANOCOBALAMIN) 1000 MCG tablet Take 1,000 mcg by mouth every evening.   No facility-administered encounter  medications on file as of 05/31/2016.     PAST MEDICAL HISTORY:   Past Medical History:  Diagnosis Date  . BPH (benign prostatic hyperplasia)   . Foley catheter in place   . Nocturnal leg cramps   . Urinary retention   . Wears glasses     PAST SURGICAL HISTORY:   Past Surgical History:  Procedure Laterality Date  . CATARACT EXTRACTION W/ INTRAOCULAR LENS  IMPLANT, BILATERAL Bilateral   . INGUINAL HERNIA REPAIR Bilateral RIGHT 1984/  LEFT 2005  . RE-DO RIGHT INGUINAL HERNIA REPAIR AND UMBILICAL HERNIA REPAIR  2001  . REMOVAL LEFT EYE INTRAOCULAR LENS AND REPLACEMENT   08-29-2013  . TRANSURETHRAL RESECTION OF PROSTATE N/A 09/23/2013   Procedure: TRANSURETHRAL RESECTION OF THE PROSTATE (TURP) WITH GYRUS;  Surgeon: Claybon Jabs, MD;  Location: [facility];  Service: Urology;  Laterality: N/A;    SOCIAL HISTORY:   Social History   Social History  . Marital status: Married    Spouse name: N/A  . Number of children: N/A  . Years of education: N/A   Occupational History  . 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 Smoking status: Never Smoker  . Smokeless tobacco: Never Used  . Alcohol use No  . Drug use:  No  . Sexual activity: Not on file   Other Topics Concern  . Not on file   Social History Narrative  . No narrative on file    FAMILY HISTORY:   Family Status  Relation Status  . Mother Deceased   CHF  . Father Deceased   DM, heart attack  . Son Alive   healthy  . Son Alive   healthy  . Son Alive   healthy    ROS:  A complete 10 system review of systems was obtained and was unremarkable apart from what is mentioned above.  PHYSICAL EXAMINATION:    VITALS:   There were no vitals filed for this visit.  GEN:  The patient appears stated age and is in NAD. HEENT:  Normocephalic, atraumatic.  The mucous membranes are moist. The superficial temporal arteries are without ropiness or  tenderness. CV:  RRR Lungs:  CTAB Neck/HEME:  There are no carotid bruits bilaterally.  Neurological examination:  Orientation: The patient is alert and oriented x3. Fund of knowledge is appropriate.  Recent and remote memory are intact.  Attention and concentration are normal.    Able to name objects and repeat phrases.He does look to his wife for the finer details of the history. Cranial nerves: There is good facial symmetry. There is mild facial hypomimia.  Pupil on the L is slightly irregular from prior surgery.  . Fundoscopic exam reveals clear margins bilaterally. Extraocular muscles are intact. The visual fields are full to confrontational testing. The speech is fluent and clear.  He does have some occasional word finding trouble.  He is mildly hypophonic.   Soft palate rises symmetrically and there is no tongue deviation. Hearing is intact to conversational tone. Sensation: Sensation is intact to light and pinprick throughout (facial, trunk, extremities). Vibration is decreased at the bilateral big toe. There is no extinction with double simultaneous stimulation. There is no sensory dermatomal level identified. Motor: Strength is 5/5 in the bilateral upper and lower extremities.   Shoulder shrug is equal and symmetric.  There is no pronator drift. Deep tendon reflexes: Deep tendon reflexes are 2+/4 at the bilateral biceps, triceps, brachioradialis, 3- at the bilateral patella and 2/4 at the bilateral achilles. Plantar responses are downgoing bilaterally.  Movement examination: Tone: There is normal tone in the bilateral upper extremities.  The tone in the lower extremities is normal.  Abnormal movements: There is a mild resting tremor of the R>LUE.  He has minimal tremor of the outstretched hands that does increase with intention.  Doesn't spill water when asked to pour it from one glass to another.  Has mild head tremor in "yes" direction. Coordination:  There is no decremation with RAM's,  with any form of RAMS, including alternating supination and pronation of the forearm, hand opening and closing, finger taps, heel taps and toe taps. Gait and Station: The patient has minimal difficulty arising out of a deep-seated chair without the use of the hands. The patient's stride length is normal but he has stooped posture and has increase in RUE resting tremor with ambulation.  Labs:   Lab Results  Component Value Date   TSH 1.11 11/30/2015   Lab Results  Component Value Date   [id number] 11/30/2015   Lab Results  Component Value Date   FOLATE 18.8 11/30/2015     ASSESSMENT/PLAN:  1.  Tremor  -I had a long discussion with the patient and his wife.  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  He really didn't look parkinsonian today until he walked so it may be vascular in nature, and even was mild when he walked.   2.  Gait instability  -I think that this is multifactorial.  He has some evidence of parkinsonism but I think that, if anything, this is vascular (less worried for idiopathic than last visit).  He also has evidence of peripheral neuropathy on examination.  Labs were unremarkable in this regard.  -will send for balance therapy 3.  Cerebral small vessel disease  -if able and no hx of GI bleed, recommend ASA, 81 mg EC daily 4.  Memory change  -He has complained about memory loss for a long time and his wife thinks perhaps he had neuropsych testing 10-15 years ago and was told that he had depression (although they did not buy this diagnosis).  I am concerned that perhaps he has some neurodegenerative dementia now.  We will schedule him for neuropsych testing as they had cx last time but are ready to schedule now. 5.  I will plan on seeing him in 6 months, sooner should new neurologic issues arise.  Much greater than 50% of this 30 minute visit was spent in counseling and coordinating care.

## 2016-05-31 ENCOUNTER — Encounter: Payer: Self-pay | Admitting: Neurology

## 2016-05-31 ENCOUNTER — Ambulatory Visit (INDEPENDENT_AMBULATORY_CARE_PROVIDER_SITE_OTHER): Payer: Medicare Other | Admitting: Neurology

## 2016-05-31 VITALS — BP 140/78 | HR 70 | Ht 70.0 in | Wt 175.0 lb

## 2016-05-31 DIAGNOSIS — G250 Essential tremor: Secondary | ICD-10-CM | POA: Diagnosis not present

## 2016-05-31 DIAGNOSIS — G214 Vascular parkinsonism: Secondary | ICD-10-CM

## 2016-05-31 DIAGNOSIS — G609 Hereditary and idiopathic neuropathy, unspecified: Secondary | ICD-10-CM

## 2016-05-31 NOTE — Patient Instructions (Signed)
1. We will reschedule neurocognitive testing with Dr. Si Raider.   2. You have been referred [facility] for [facility] therapy. They will call you directly to schedule an appointment.  Please call [phone] if you do not hear from them.

## 2016-05-31 NOTE — Addendum Note (Signed)
Addended byAnnamaria Helling on: 05/31/2016 08:56 AM   Modules accepted: Orders

## 2016-06-14 ENCOUNTER — Other Ambulatory Visit: Payer: Self-pay | Admitting: *Deleted

## 2016-06-14 DIAGNOSIS — R251 Tremor, unspecified: Secondary | ICD-10-CM

## 2016-06-14 DIAGNOSIS — R270 Ataxia, unspecified: Secondary | ICD-10-CM

## 2016-06-14 DIAGNOSIS — G214 Vascular parkinsonism: Secondary | ICD-10-CM

## 2016-07-05 ENCOUNTER — Ambulatory Visit (INDEPENDENT_AMBULATORY_CARE_PROVIDER_SITE_OTHER): Payer: Medicare Other | Admitting: Psychology

## 2016-07-05 ENCOUNTER — Encounter: Payer: Self-pay | Admitting: Psychology

## 2016-07-05 ENCOUNTER — Telehealth: Payer: Self-pay | Admitting: Neurology

## 2016-07-05 DIAGNOSIS — R413 Other amnesia: Secondary | ICD-10-CM | POA: Diagnosis not present

## 2016-07-05 DIAGNOSIS — G214 Vascular parkinsonism: Secondary | ICD-10-CM

## 2016-07-05 NOTE — Progress Notes (Signed)
NEUROPSYCHOLOGICAL INTERVIEW (CPT: [id number])  Name: Joshua Browning Date of Birth: 25-Sep-1945 Date of Interview: 07/05/2016  Reason for Referral:  Joshua Browning is a 70 y.o., right-handed male who is referred for neuropsychological evaluation by Dr. Wells Guiles Tat of [facility] due to concerns about cognitive decline. This patient is accompanied in the office by his wife, [name], who supplements the history.  History of Presenting Problem:  [name] saw Dr. Carles Collet for initial neurologic consultation for tremor, balance and gait changes on 11/30/2015. The patient has had tremor in both hands for about five years. Per Dr. 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 He also has peripheral neuropathy. 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   He and his wife also reported memory decline over the last 15 years. He reportedly saw Dr. Leverne Humbles around 2002 for neuropsychological evaluation but it was thought that his cognitive complaints were secondary to depression at that time. The patient does endorse depression around that time as he was in between jobs and having difficulty finding employment. He saw Dr. Leverne Humbles for therapy for a while, which was helpful. He tried Effexor but he had side effects so he did not keep taking it. The patient and his wife think his memory abilities have declined over time, since his last evaluation. The patient reported that he is particularly concerned by trouble retrieving names of people.  Upon direct questioning, the patient and his wife also reported:   Forgetting recent conversations/events: Per wife: sometimes. Per patient: only occasionally at work.   Repeating statements/questions: Yes, per wife Misplacing/losing items: Yes.  Forgetting appointments or other obligations: No, uses a calendar Forgetting to take medications: Doesn't take any  Rx medications, just vitamins  Difficulty concentrating: Sometimes Starting but not finishing tasks: Sometimes because he gets distracted and doesn't remember what he was doing Distracted easily: Sometimes Processing information more slowly: No  Word-finding difficulty: Yes, and he gets very frustrated by it, per his wife. He will start telling a story or news piece but he can't finish because he can't recall or express the information. Word substitutions: Rarely Writing difficulty: No  Spelling difficulty: No Comprehension difficulty: Yes  Getting lost when driving: No Making wrong turns when driving: Yes, per wife  Uncertain about directions when driving or passenger: Denies   Family history is reportedly significant for dementia in his mother (in her 48s it was particularly bad) and tremor in his father and his oldest son. No one in his family has been diagnosed with PD.   Current Functioning: [name] lives with his wife in their own home. He independently manages all instrumental ADLs including driving, medications, finances, and appointments. He has missed a bill payment occasionally but it is not a regular problem. He works four days a week for [employer]. He leads a relatively active lifestyle. His work is somewhat physical, and he walks his pet dog 2-3 times per day. He also enjoys kayaking. He reported increased fatigue lately. He generally sleeps well. Physically, he reports difficulty with walking and balance. He has not had any recent falls. He was supposed to be scheduled for PT but he never received a call.   With regard to mood, he does endorse stress related to his work. He and his wife report that he is more irritable and more easily frustrated. Two members of his extended  family have passed away recently, which was difficult for him.   He has not had any hallucinations. He denied suicidal ideation or intention. No imminent risk of self-harm was  identified.   Social History: Born/Raised: [place]. He was an only child. 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  Marital history: Married x42 years. 3 children (all sons), 2 grandchildren. Alcohol/Tobacco/Substances: No alcohol, never a smoker. No substance abuse.   Medical History: Past Medical History:  Diagnosis Date  . BPH (benign prostatic hyperplasia)   . Foley catheter in place   . Nocturnal leg cramps   . Urinary retention   . Wears glasses     Current Medications:  Outpatient Encounter Prescriptions as of 07/05/2016  Medication Sig  . Cholecalciferol (VITAMIN D3) 1000 UNITS CAPS Take 1 capsule by mouth every evening.  . Glucosamine Sulfate 1000 MG CAPS Take by mouth 2 (two) times daily.  . Magnesium 100 MG CAPS Take 1 capsule by mouth every evening.  . Multiple Vitamins-Minerals (VITABASIC SENIOR PO) Take 1 tablet by mouth.  . ranitidine (ZANTAC) 150 MG tablet Take 150 mg by mouth 2 (two) times daily.  . vitamin B-12 (CYANOCOBALAMIN) 1000 MCG tablet Take 1,000 mcg by mouth every evening.   No facility-administered encounter medications on file as of 07/05/2016.      Behavioral Observations:   Appearance: Neatly and appropriately dressed, appearing somewhat younger than his chronological age. Bilateral hand tremor as well as head tremor are observed. Gait: Ambulated independently, mild gait abnormality Speech: Fluent; slow speech; mild word finding difficulty. Increased response latencies. Somewhat verbose, providing more detail than is needed. Thought process: Generally linear, somewhat circumlocutory at times Affect: Full,  euthymic Interpersonal: Pleasant, appropriate   TESTING: There is medical necessity to proceed with neuropsychological assessment as the results will be used to aid in differential diagnosis and clinical decision-making and to inform specific treatment recommendations. Per the patient, his wife and medical records reviewed, there has been a change in cognitive functioning and a reasonable suspicion of neurocognitive disorder.   PLAN: The patient will return for a full battery of neuropsychological testing with a psychometrician under my supervision. Education regarding testing procedures was provided. Subsequently, the patient will see this provider for a follow-up session at which time his test performances and my impressions and treatment recommendations will be reviewed in detail.   Full neuropsychological evaluation report to follow.

## 2016-07-05 NOTE — Telephone Encounter (Signed)
Gave patient the number to neuro rehab to contact them to schedule.

## 2016-07-05 NOTE — Telephone Encounter (Signed)
Patient needs to talk to some about PT please call [phone]

## 2016-07-15 ENCOUNTER — Ambulatory Visit: Payer: Medicare Other | Attending: Neurology | Admitting: Physical Therapy

## 2016-07-15 DIAGNOSIS — R293 Abnormal posture: Secondary | ICD-10-CM | POA: Diagnosis present

## 2016-07-15 DIAGNOSIS — R2681 Unsteadiness on feet: Secondary | ICD-10-CM | POA: Diagnosis present

## 2016-07-15 DIAGNOSIS — R2689 Other abnormalities of gait and mobility: Secondary | ICD-10-CM | POA: Insufficient documentation

## 2016-07-15 NOTE — Therapy (Signed)
[facility] [address] [place], [place], [address] Phone: [phone]   Fax:  [phone]  Physical Therapy Evaluation  Patient Details  Name: Joshua Browning MRN: [id number] Date of Birth: 09-Jul-1946 Referring Provider: Alonza Bogus, DO  Encounter Date: 07/15/2016      PT End of Session - 07/15/16 1139    Visit Number 1   Number of Visits 17   Date for PT Re-Evaluation 09/14/16   Authorization Type [employer] Medicare   PT Start Time 0803   PT Stop Time 0846   PT Time Calculation (min) 43 min   Activity Tolerance Patient tolerated treatment well   Behavior During Therapy [facility] for tasks assessed/performed      Past Medical History:  Diagnosis Date  . BPH (benign prostatic hyperplasia)   . Foley catheter in place   . Nocturnal leg cramps   . Urinary retention   . Wears glasses     Past Surgical History:  Procedure Laterality Date  . CATARACT EXTRACTION W/ INTRAOCULAR LENS  IMPLANT, BILATERAL Bilateral   . INGUINAL HERNIA REPAIR Bilateral RIGHT 1984/  LEFT 2005  . RE-DO RIGHT INGUINAL HERNIA REPAIR AND UMBILICAL HERNIA REPAIR  2001  . REMOVAL LEFT EYE INTRAOCULAR LENS AND REPLACEMENT   08-29-2013  . TRANSURETHRAL RESECTION OF PROSTATE N/A 09/23/2013   Procedure: TRANSURETHRAL RESECTION OF THE PROSTATE (TURP) WITH GYRUS;  Surgeon: Claybon Jabs, MD;  Location: [facility];  Service: Urology;  Laterality: N/A;    There were no vitals filed for this visit.       Subjective Assessment - 07/15/16 0804    Subjective Pt is a 70 year old male who presents to OP PT with some changes in balance in the past 1-2 years.  "I walk alot with my knees bent."  Reports occasional tremors in RUE>LUE.  No falls reported.  Reports having difficulty going up stairs with feeling like he may lose balance backwards.   Patient Stated Goals Pt's goal for therapy is to improve balance and stair negotiation.   Currently in  Pain? No/denies            [facility] PT Assessment - 07/15/16 [id number]      Assessment   Medical Diagnosis Parkinsonism   Referring Provider Alonza Bogus, DO   Onset Date/Surgical Date --  MD visit October 2017     Precautions   Precautions Fall     Balance Screen   Has the patient fallen in the past 6 months No   Has the patient had a decrease in activity level because of a fear of falling?  No   Is the patient reluctant to leave their home because of a fear of falling?  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[place], lifting battery packs   Leisure  Enjoys walking in neighborhood     Posture/Postural Control   Posture/Postural Control Postural limitations   Postural Limitations Forward head;Rounded Shoulders;Increased thoracic kyphosis     ROM / Strength   AROM / PROM / Strength Strength;AROM     AROM   Overall AROM  --  tightness noted in bilateral hamstrings     Strength   Overall Strength Comments Grossly tested at least 4/5 bilateral lower extremities     Transfers   Transfers Sit to Stand;Stand to Sit   Sit to Stand 5: Supervision;Without upper extremity assist;From chair/3-in-1   Five time sit to stand comments  14.55  slowed on last 3 reps   Stand to Sit 5: Supervision;Without upper extremity assist;To chair/3-in-1   Comments Reports difficulty  with getting up from low transfers/squats     Ambulation/Gait   Ambulation/Gait Yes   Ambulation/Gait Assistance 5: Supervision   Ambulation/Gait Assistance Details Occasional veering R and L with gait activities   Ambulation Distance (Feet) 250 Feet   Assistive device None   Gait Pattern Step-through pattern;Decreased arm swing - right;Decreased step length - right;Narrow base of support  knees flexed with gait   Ambulation Surface Level;Indoor   Gait velocity 10.71 sec = 3.06 ft/sec   Gait Comments With FGA, pt has incidences of veering to R and L with additional tasks with gait     Standardized Balance Assessment   Standardized Balance Assessment Timed Up and Go Test     Timed Up and Go Test   Normal TUG (seconds) 12.08   Manual TUG (seconds) 12.58  increased tremors noted in RUE with carrying task   Cognitive TUG (seconds) 14.29   TUG Comments Scores >13.5-15 seconds indicate increased fall risk >10% difference between TUG and TUG cognitive indicate difficulty with dual tasking     High Level Balance   High Level Balance Comments Posterior push and release:  >3 steps posterior direction, needing therapist assist to regain balance; forward push and release 2 steps forward and independently regains balance; lateral push and release:  pt takes 1-2 steps and independently regains balance     Functional Gait  Assessment   Gait assessed  Yes   Gait Level Surface Walks 20 ft in less than 7 sec but greater than 5.5 sec, uses assistive device, slower speed, mild gait deviations, or deviates 6-10 in outside of the 12 in walkway width.  6.54 sec   Change in Gait Speed Able to change speed, demonstrates mild gait deviations, deviates 6-10 in outside of the 12 in walkway width, or no gait deviations, unable to achieve a major change in velocity, or uses a change in velocity, or uses an assistive device.   Gait with Horizontal Head Turns Performs head turns with moderate changes in gait  velocity, slows down, deviates 10-15 in outside 12 in walkway width but recovers, can continue to walk.   Gait with Vertical Head Turns Performs task with moderate change in gait velocity, slows down, deviates 10-15 in outside 12 in walkway width but recovers, can continue to walk.   Gait and Pivot Turn Pivot turns safely in greater than 3 sec and stops with no loss of balance, or pivot turns safely within 3 sec and stops with mild imbalance, requires small steps to catch balance.   Step Over Obstacle Is able to step over one shoe box (4.5 in total height) but must slow down and adjust steps to clear box safely. May require verbal cueing.  Gait with Narrow Base of Support Ambulates less than 4 steps heel to toe or cannot perform without assistance.   Gait with Eyes Closed Walks 20 ft, slow speed, abnormal gait pattern, evidence for imbalance, deviates 10-15 in outside 12 in walkway width. Requires more than 9 sec to ambulate 20 ft.  10.36 sec   Ambulating Backwards Walks 20 ft, uses assistive device, slower speed, mild gait deviations, deviates 6-10 in outside 12 in walkway width.  18.72 sec   Steps Alternating feet, no rail.   Total Score 15   FGA comment: Scores <22/30 are indicative of increased fall risk                             PT Short Term Goals - 07/15/16 1257      PT SHORT TERM GOAL #1   Title Pt will be independent with HEP for improved balance, posture, functional mobility.  TARGET 08/14/16   Time 4   Period Weeks   Status New     PT SHORT TERM GOAL #2   Title Pt will improve 5x sit<>stand to less than or equal to 12.5 seconds for improved transfer efficiency and safety.   Time 4   Period Weeks   Status New     PT SHORT TERM GOAL #3   Title Pt will improve posterior step and release test to 2 steps or less with independent balance recovery, to demonstrate improved balance recovery in posterior direction.   Time 4   Period Weeks   Status New      PT SHORT TERM GOAL #4   Title Pt will negotiate at least 4 steps with intermittent UE support, while carrying object in one hand; 5 of 5 trials no LOB, for improved stair negotiation.   Time 4   Period Weeks   Status New     PT SHORT TERM GOAL #5   Title Pt will verbalize understanding of local Parkinson's disease related resources.   Time 4   Period Weeks   Status New           PT Long Term Goals - 07/15/16 1300      PT LONG TERM GOAL #1   Title Pt will verbalize understanding of fall prevention in home environment.  TARGET 09/14/16   Time 8   Period Weeks   Status New     PT LONG TERM GOAL #2   Title Pt will perform at least 8 of 10 reps of sit<>stand transfers, from less than 18 inch height, for improved transfer effciency and safety.   Time 4   Period Weeks   Status New     PT LONG TERM GOAL #3   Title Pt will improve Functional Gait Assessment score to at least 19/30 for decreased fall risk.   Time 8   Period Weeks   Status New     PT LONG TERM GOAL #4   Title Pt will verbalize plans for continued community fitness upon D/C from PT.   Time 8   Period Weeks   Status New     PT LONG TERM GOAL #5   Title Pt will report at least 50% improvement in performance of squatting to low surfaces and getting onto/up from floor.   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  Pt presents with decreased functional strength with transfers and low surface squats, postural abnormality, decreased balance, decreased timing and coordination with gait, postural instability.  Pt has not had any falls, but he is at fall risk Functional Gait Assessment score.  Pt will benefit from skilled physical therapy to address the above stated deficits for improved functional  mobility and decreased fall risk.   Rehab Potential Good   PT Frequency 2x / week   PT Duration 8 weeks   PT Treatment/Interventions ADLs/Self Care Home Management;Functional mobility training;Stair training;Gait training;Therapeutic activities;Therapeutic exercise;Neuromuscular re-education;Balance training;Patient/family education   PT Next Visit Plan Transfer training from chair>progressing to lower surfaces, squats; stair negotiation carrying objects; PWR! Moves sitting and standing; step strategies in varied directions   Consulted and Agree with Plan of Care Patient      Patient will benefit from skilled therapeutic intervention in order to improve the following deficits and impairments:  Abnormal gait, Decreased balance, Decreased mobility, Decreased strength, Difficulty walking, Impaired flexibility, Postural dysfunction  Visit Diagnosis: Other abnormalities of gait and mobility  Unsteadiness on feet  Abnormal posture      G-Codes - 07-23-2016 1303    Functional Assessment Tool Used 5x sit<>stand 14.55 sec, TUG 12.08 sec, TUG cog 14.29 sec, Functional Gait Assessment 15/30   Functional Limitation Mobility: Walking and moving around   Mobility: Walking and Moving Around Current Status [phone]) At least 20 percent but less than 40 percent impaired, limited or restricted   Mobility: Walking and Moving Around Goal Status [phone]) At least 1 percent but less than 20 percent impaired, limited or restricted       Problem List Patient Active Problem List   Diagnosis Date Noted  . BPH (benign prostatic hypertrophy) with urinary retention 09/23/2013    [place]o Passage W. 07-23-2016, 1:04 PM  Frazier Butt., PT  [facility] [address] [place], [place], [address] Phone: [phone]   Fax:  [phone]  Name: KYJUAN STOLLEY MRN: [id number] Date of Birth: 1946/04/22

## 2016-07-19 ENCOUNTER — Ambulatory Visit (INDEPENDENT_AMBULATORY_CARE_PROVIDER_SITE_OTHER): Payer: Medicare Other | Admitting: Psychology

## 2016-07-19 DIAGNOSIS — G214 Vascular parkinsonism: Secondary | ICD-10-CM

## 2016-07-19 DIAGNOSIS — R413 Other amnesia: Secondary | ICD-10-CM

## 2016-07-19 NOTE — Progress Notes (Signed)
   Neuropsychology Note  Joshua Browning returned today for 3 hours of neuropsychological testing with technician, Milana Kidney, BS, under the supervision of Dr. Macarthur Critchley. The patient did not appear overtly distressed by the testing session, per behavioral observation or via self-report to the technician. Rest breaks were offered. Joshua Browning will return within 2 weeks for a feedback session with Dr. Si Raider at which time his test performances, clinical impressions and treatment recommendations will be reviewed in detail. The patient understands he can contact our office should he require our assistance before this time.  Full report to follow.

## 2016-07-24 NOTE — Progress Notes (Signed)
NEUROPSYCHOLOGICAL EVALUATION   Name:    Joshua Browning  Date of Birth:   May 02, 1946 Date of Interview:  07/05/2016 Date of Testing:  07/19/2016   Date of Feedback:  07/26/2016       Background Information:  Reason for Referral:  Joshua Browning is a 70 y.o., right-handed male referred by Dr. Wells Guiles Browning to assess his current level of cognitive functioning and assist in differential diagnosis. The current evaluation consisted of a review of available medical records, an interview with the patient and his wife, [employer], and the completion of a neuropsychological testing battery. Informed consent was obtained.  History of Presenting Problem:  Joshua Browning saw Dr. Carles Browning for initial neurologic consultation for tremor, balance and gait changes on 11/30/2015. The patient has had tremor in both hands for about five years. Per Dr. 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 He also has peripheral neuropathy. 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   He and his wife also reported memory decline over the last 15 years. He reportedly saw Dr. Leverne Browning around 2002 for neuropsychological evaluation but it was thought that his cognitive complaints were secondary to depression at that time. The patient does endorse depression around that time as he was in between jobs and having difficulty finding employment. He saw Dr. Leverne Browning for therapy for a while, which was helpful. He tried Effexor but he had side effects so he did not keep taking it. The patient and his wife think his memory abilities have declined over time, since his last evaluation. The patient reported that he is particularly concerned by trouble retrieving names of people.  Upon direct questioning, the patient and his wife also reported:   Forgetting recent conversations/events: Per wife: sometimes. Per patient: only  occasionally at work.   Repeating statements/questions: Yes, per wife Misplacing/losing items: Yes.  Forgetting appointments or other obligations: No, uses a calendar Forgetting to take medications: Doesn't take any Rx medications, just vitamins  Difficulty concentrating: Sometimes Starting but not finishing tasks: Sometimes because he gets distracted and doesn't remember what he was doing Distracted easily: Sometimes Processing information more slowly: No  Word-finding difficulty: Yes, and he gets very frustrated by it, per his wife. He will start telling a story or news piece but he can't finish because he can't recall or express the information. Word substitutions: Rarely Writing difficulty: No  Spelling difficulty: No Comprehension difficulty: Yes  Getting lost when driving: No Making wrong turns when driving: Yes, per wife  Uncertain about directions when driving or passenger: Denies   Family history is reportedly significant for dementia in his mother (in her 63s it was particularly bad) and tremor in his father and his oldest son. No one in his family has been diagnosed with PD.   Current Functioning: Joshua Browning lives with his wife in their own home. He independently manages all instrumental ADLs including driving, medications, finances, and appointments. He has missed a bill payment occasionally but it is not a regular problem. He works four days a week for [employer]. He leads a relatively active lifestyle. His work is somewhat physical, and he walks his pet dog 2-3 times per day. He also enjoys kayaking. He reported increased fatigue lately. He generally sleeps well. Physically, he reports difficulty with walking and balance. He has not had any recent falls. He was supposed to  be scheduled for PT but he never received a call.   With regard to mood, he does endorse stress related to his work. He and his wife report that he is more irritable and more easily  frustrated. Two members of his extended family have passed away recently, which was difficult for him.   He has not had any hallucinations. He denied suicidal ideation or intention. No imminent risk of self-harm was identified.   Social History: Born/Raised: [place]. He was an only child. 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  Marital history: Married x42 years. 3 children (all sons), 2 grandchildren. Alcohol/Tobacco/Substances: No alcohol, never a smoker. No substance abuse.   Medical History:  Past Medical History:  Diagnosis Date  . BPH (benign prostatic hyperplasia)   . Foley catheter in place   . Nocturnal leg cramps   . Urinary retention   . Wears glasses     Current medications:  Outpatient Encounter Prescriptions as of 07/26/2016  Medication Sig  . Cholecalciferol (VITAMIN D3) 1000 UNITS CAPS Take 1 capsule by mouth every evening.  . Glucosamine Sulfate 1000 MG CAPS Take by mouth 2 (two) times daily.  . Magnesium 100 MG CAPS Take 1 capsule by mouth every evening.  . Multiple Vitamins-Minerals (VITABASIC SENIOR PO) Take 1 tablet by mouth.  . ranitidine (ZANTAC) 150 MG tablet Take 150 mg by mouth 2 (two) times daily.  . vitamin B-12 (CYANOCOBALAMIN) 1000 MCG tablet Take 1,000 mcg by mouth every evening.   No facility-administered encounter medications on file as of 07/26/2016.      Current Examination:  Behavioral Observations:   Appearance: Neatly and appropriately dressed, appearing somewhat younger than his chronological age. Bilateral hand tremor as well as head tremor are observed. Gait: Ambulated independently, mild gait  abnormality Speech: Fluent; slow speech; mild word finding difficulty. Increased response latencies. Somewhat verbose, providing more detail than is needed. Thought process: Generally linear, somewhat circumlocutory at times Affect: Full, euthymic Interpersonal: Pleasant, appropriate Orientation: Oriented to person, place and most aspects of time (one day off on the date); accurately named current President and his predecessor.  Tests Administered: . Test of Premorbid Functioning (TOPF) . Wechsler Adult Intelligence Scale-Fourth Edition (WAIS-IV): Similarities, Block Design, Matrix Reasoning, Symbol Search, and Digit Span subtests . Wechsler Memory Scale-Fourth Edition (WMS-IV) Older Adult Version (ages 58-90): Logical Memory I, II and Recognition subtests  . Engelhard Browning Verbal Learning Test - 2nd Edition (CVLT-2) Short Form . Repeatable Battery for the Assessment of Neuropsychological Status (RBANS) Form A:  Figure Copy and Recall subtests and Semantic Fluency subtest . Neuropsychological Assessment Battery (NAB) Language Module, Form 1: Auditory Comprehension and Naming Subtests . Symbol Digit Modalities Test (SDMT) . Controlled Oral Word Association Test (COWAT) . Trail Making Test A and B . Clock drawing test . [employer] [facility]) . Generalized Anxiety Disorder - 7 item screener (GAD-7) . Beck Depression Inventory - Second edition (BDI-II) . Parkinson's Disease Questionnaire (PDQ-39)  Test Results: Note: Standardized scores are presented only for use by appropriately trained professionals and to allow for any future test-retest comparison. These scores should not be interpreted without consideration of all the information  that is contained in the rest of the report. The most recent standardization samples from the test publisher or other sources were used whenever possible to derive standard scores; scores were corrected for age, gender, ethnicity and education when  available.   Test Scores:  Test Name Raw Score Standardized Score Descriptor  TOPF 51/70 SS= 108 Average  WAIS-IV Subtests     Similarities 22/36 ss= 9 Average  Block Design 32/66 ss= 11 Average  Matrix Reasoning 7/26 ss= 7 Low average  Symbol Search 24/60 ss= 10 Average  Digit Span Forward 11/16 ss= 12 High average  Digit Span Backward 8/16 ss= 10 Average  WMS-IV Subtests     LM I 27/53 ss= 8 Low end of average  LM II 9/39 ss= 6 Low average  LM II Recognition 21/23 Cum %: >75   RBANS Subtests     Figure Copy 20/20 Z= 1.2 High average  Figure Recall 14/20 Z= 0.4 Average  Semantic Fluency 13 Z= -1.3 Low average  CVLT-II Scores     Trial 1 5/9 Z= 0 Average  Trial 4 8/9 Z= 0.5 Average  Trials 1-4 total 26/36 T= 56 Average  SD Free Recall 6/9 Z= -0.5 Average  LD Free Recall 7/9 Z= 1 High average  LD Cued Recall 6/9 Z= 0 Average  Recognition Discriminability 7/9 hits, 0 false positives Z= 0.5 Average  Forced Choice Recognition 9/9  WNL  NAB Language Subtests     Auditory Comprehension 88/89 T= 56 Average  Naming 31/31 T= 57 High average  SDMT     Written 34/110 Z= -0.8 Low average  Oral 46/110 Z= -0.5 Average  COWAT-FAS 25 T= 36 Borderline  COWAT-Animals 13 T= 38 Low average  Trail Making Test A 45" 0 errors T= 48 Average  Trail Making Test B  84" 0 errors T= 54 Average  Clock Drawing   WNL   WCST     Total Errors 8 T= 80 Very superior  Perseverative Responses 6 T= >80 Very superior  Perseverative Errors 6 T= 80 Very superior  Conceptual Level Responses 56 T= >80 Very superior  Categories Completed 5 >16%   Trials to Complete 1st Category 11 >16%   Failure to Maintain Set 0    GAD-7 5/21  Mild   BDI-II 7/63  WNL  PDQ-39     Mobility 10%    Activities of Daily Living 4.16%    Emotional Well Being 25%    Stigma 12.5%    Social Support 0    Cognitive Impairment 18.75%    Communication 8.33%    Bodily Discomfort 16.66%        Description of Test  Results:  Premorbid verbal intellectual abilities were estimated to have been within the average range based on a test of word reading. Psychomotor processing speed was low average; when the motor component was removed, he performed in the average range. Auditory attention and working memory were high average to average, respectively. Visual-spatial construction was average to high average. Language abilities were within normal limits. Specifically, confrontation naming was high average, and semantic verbal fluency was low average. Auditory comprehension was average. With regard to verbal memory, encoding and acquisition of non-contextual information (i.e., word list) was average. After a brief distracter task, free recall was average. After a delay, free recall was high average. Performance on a yes/no recognition task was average. On another verbal memory test, encoding and acquisition of contextual auditory information (i.e., short story) was average. After a  delay, free recall was low average. Performance on a yes/no recognition task was above average. With regard to non-verbal memory, delayed free recall of visual information was average. Executive functioning was intact overall. Mental flexibility and set-shifting were average on Trails B. Verbal fluency with phonemic search restrictions was borderline impaired. Verbal abstract reasoning was average. Non-verbal abstract reasoning was low average. Deductive reasoning and problem-solving were very superior. Performance on a clock drawing task was within normal limits. On self-report questionnaires, the patient's responses were not indicative of clinically significant depression, but he did endorse mild anxiety at the present time, characterized by nervousness, inability to control worrying, worrying too much about different things, difficulty relaxing, and irritability. He reported that these symptoms are not chronic but instead related to recent stressors  (e.g., death of first cousin, loss of pet dog). On a self-report measure assessing the impact of tremor/parkinsonism on quality of life, the patient did not endorse any areas of significant concern. He again reported mildly reduced emotional well-being (related to current stressors). He reported mild cognitive impairment and mild bodily discomfort. He reported strong social support.    Clinical Impressions: Mild cognitive impairment, likely due to small vessel disease.  Results of cognitive testing were largely within normal limits. He did demonstrate mildly impaired verbal fluency, but confrontation naming was intact, suggesting disruption of frontal-subcortical networks rather than cortical involvement. Similarly, he demonstrated mildly reduced retrieval of some verbal information, but recognition was intact, and he demonstrated intact retrieval of other verbal information that had been repeated across multiple learning trials. This pattern on memory testing is more reflective of frontal-subcortical dysfunction than hippocampal consolidation dysfunction.  Overall, these test results do not reflect cognitive deficits at the level of dementia at this time. Instead, a diagnosis of mild cognitive impairment is warranted. 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 At the present time, he does NOT demonstrate the executive dysfunction, poor working memory or slowed processing speed that usually accompany cerebral vascular disease.  The patient does have a history of depression, and he recently has been experiencing some increased anxiety/depression related to situational stressors. This also could be contributing to subjective cognitive complaints.    Recommendations/Plan: Based on the findings of the present evaluation, the following recommendations are offered:  1. Strategies to enhance cognitive functioning in daily life were reviewed with the patient, and  written information was provided.  2. The patient should continue to participate in activities that provide mental stimulation, social interaction and safe cardiovascular exercise. 3. Stress management principles were reviewed with the patient. He may also benefit from CBT around this topic. 4. Neuropsychological re-evaluation in one year is recommended in order to monitor cognitive status, track any progression of symptoms and further assist with treatment planning. 5. The patient's wife is curious to know if Adderall could be helpful in enhancing the patient's attention and cognitive functioning in daily life. I encouraged them to address this question with their primary care physician.   Feedback to Patient: Joshua Browning and his wife returned for a feedback appointment on 07/26/2016 to review the results of his neuropsychological evaluation with this provider. 30 minutes face-to-face time was spent reviewing his test results, my impressions and my recommendations as detailed above.    Total time spent on this patient's case: [id number] unit for interview with psychologist; [phone] units of testing by psychometrician under psychologist's supervision; [phone] units for medical record review, scoring of neuropsychological tests, interpretation of test results,  preparation of this report, and review of results to the patient by psychologist.      Thank you for your referral of Arabella Merles. Please feel free to contact me if you have any questions or concerns regarding this report.

## 2016-07-26 ENCOUNTER — Ambulatory Visit (INDEPENDENT_AMBULATORY_CARE_PROVIDER_SITE_OTHER): Payer: Medicare Other | Admitting: Psychology

## 2016-07-26 ENCOUNTER — Ambulatory Visit: Payer: Medicare Other | Admitting: Physical Therapy

## 2016-07-26 ENCOUNTER — Encounter: Payer: Self-pay | Admitting: Psychology

## 2016-07-26 DIAGNOSIS — R2689 Other abnormalities of gait and mobility: Secondary | ICD-10-CM

## 2016-07-26 DIAGNOSIS — G214 Vascular parkinsonism: Secondary | ICD-10-CM

## 2016-07-26 DIAGNOSIS — R2681 Unsteadiness on feet: Secondary | ICD-10-CM

## 2016-07-26 DIAGNOSIS — R293 Abnormal posture: Secondary | ICD-10-CM

## 2016-07-26 DIAGNOSIS — G3184 Mild cognitive impairment, so stated: Secondary | ICD-10-CM

## 2016-07-26 NOTE — Patient Instructions (Addendum)
HIP: Hamstrings - Short Sitting    Rest leg on raised surface. Keep knee straight. The stretch should be gentle.  Lift chest. Hold 30 seconds. Repeat 3 times on each side several times a day.  Copyright  VHI. All rights reserved.    Functional Quadriceps: Sit to Stand    Sit on edge of chair, feet flat on floor. Stand upright, extending knees fully. Repeat 10 times per set. Do 3 sessions per day.  http://orth.exer.us/734   Copyright  VHI. All rights reserved.     Alternating Step Up    Hold railing for support.  Stand facing step. Step up leading with right leg. Step back down leading with same leg. Perform 10 reps.  Repeat 10 times with left leg leading.  Do 2 times on each leg.  Do twice a day.  Copyright  VHI. All rights reserved.

## 2016-07-26 NOTE — Patient Instructions (Signed)
How STRESS and ANXIETY affect your ability to pay attention  . In times of stress, it is common to have difficulty concentrating, paying attention and making decisions . Various anxiety disorders also interfere with attention and concentration . Problems with attention and concentration can disrupt the process of learning and making new memories, which can make it seem like there is a problem with your memory. In your daily life, you may experience this disruption as forgetting names and appointments, misplacing items, and needing to make lists for shopping and errands  . Regardless of the test scores on neuropsychological evaluation, psychosocial stress can interfere with the ability to make use of your cognitive resources and function optimally across settings such as work or school, maintaining the home and responsibilities, and personal relationships  . Assessment and monitoring of symptoms o A thorough psychological evaluation is important in order to assess current symptoms and determine the best treatment o The assessment provides a starting point to monitor symptom improvement  . Treatment o Psychotherapy: Individual and group psychotherapy have been shown to be effective in improving the symptoms of depression and anxiety disorders, and typically cognitive difficulties improve with symptom reduction o Mindfulness based interventions have been shown to be effective in improving stress management o Behavioral strategies for improving attention and concentration in your daily life may be helpful. (See attached information.)  . Pleasurable activities, exercise, daily structure o People who have high stress levels often stop engaging in activities that they enjoy, which can have an additional negative effect on their mood. It is recommended that all patients set aside time in their schedule to engage in pleasurable activities because this has been shown to help alleviate many types of symptoms.  Examples of pleasurable activities include taking a walk/spending time outdoors, reading or seeing a movie, spending time with friends and/or family, listening to music, and volunteering o In addition to the numerous physical health benefits of exercise, there are many psychological and cognitive benefits as well.  o Maintaining a regular schedule is also recommended, such as setting regular sleep/wake times and scheduling activities such as social outings      Tension reduction techniques     . Tension is related to anxiety, which can lead to cognitive difficulties. Use of tension reduction techniques can break this cycle by decreasing tension and anxiety  Abdominal Breathing = breathing from the depths of your abdomen  . Inhale through your nose, slowly, until the breath reaches your abdomen, to a count of 5 . Release, exhaling from your nose or mouth to a count of 10 . You can place your hand on your stomach, and if you're doing this correctly, your hand will actually rise and fall with each breath . Try to concentrate on your breathing, and clear your mind of anything else if you can . Keep this up for 3-5 minutes, and do it as often as you can throughout the day, and in bed, before you go to sleep  Grounding Techniques = directing attention to something in your environment  . Find a quiet room where you can be alone . Observe and describe objects in the room, by their physical properties  . Do not attach your opinions to the objects . You can also incorporate abdominal breathing  Visualization = mental relaxation that generates a peaceful state of being  . Imagine that you are in a different, peaceful place . Enrich the visualization by imagining accompanying sensations (sights, smells, sounds, textures, temperatures)  .  The visualization process consists of 3 steps:  o Preliminary relaxation (abdominal breathing for about 5 minutes) o The visualization exercise (two examples  of scripts are provided below) o Returning to an alert state of mind       Strategies to enhance cognitive functioning Attention, concentration, memory encoding and consolidation    . Make a plan and be prepared o If you find that you are more attentive at certain times of the day (i.e., the morning), plan important activities and discussions at that time o Determine which activities take the most time and which are most important, then prioritize your "to do list" based on this information o Break tasks into simpler parts, understand the steps involved before starting o Rehearse the steps mentally or write them down. If you write them down, you can use this as a checklist to check off as you complete them. o Visualize completing the task  . Use external aids  o Write everything down that you do not need to know or work with right now. Don't store extra information in your brain that you don't need right now.  o Use a calendar or planner to make checklists, due dates and "to do" lists. o Use ONLY ONE calendar or planner and look at it frequently o Set alarms for important deadlines or appointments  . Minimize interruptions and distractions  o Find a good work environment, e.g., quiet room with a desk, close curtains, use earplugs, mask sounds with a fan or white noise machine o Turn off cell phone and/or email alerts during important tasks. In fact, it is helpful to schedule a block of time each day where you limit phone and email interruptions and focus on just the more detailed work you have. o Try to minimize the amount of background noise (i.e., television, music) when engaged in important tasks or conversations with others (note that some individuals find soft background music helpful in minimize distraction, so you may need to experiment with optimal level of noise for specific situations)  . Use active effort = consciously attending to details, closely analyzing o Failures of  encoding may reflect failure to attend to one's own actions o Be prepared to work more slowly than you usually do  o When reading, allow time for re-reading sections  o Check your work for errors  . Avoid multitasking o Do not attempt to complete more than one task at one time. Focus on one task until it is completed and then move on to the next one. o Avoid other activities while engaged in important tasks, such as talking on the phone while balancing the checkbook, making a shopping list during a meeting.   . Use self-talk during tasks o Repeat the steps of the activity to yourself as you complete them o Talk to yourself about your progress o This helps you maintain focus on the task and makes it easier to remember completing the task (Similar to "active effort" above)  . Conserve energy o Conserve energy to avoid fatigue and its effects on cognition - Get enough sleep - Pace yourself  and make sure to take breaks - Be open to receiving help - Exercise for increased energy  . Conversational vigilance = paying attention during a conversation  o Listen actively: focus on the speaker and position yourself so that you can clearly hear the him/her, and have open/relaxed posture  o Eye contact: Maintaining eye contact with the person you are speaking with may increase  the likelihood that important information is properly received  o Ask questions: Ask questions for clarification (e.g., request that the speaker explain something in a different way) or ask for information to be repeated if you become distracted, or if you do not hear or understand something during a conversation  o Paraphrase: Summarize or repeat back important information from a conversation in your own words to facilitate communication and ensure that you have heard correctly and understand

## 2016-07-26 NOTE — Therapy (Signed)
[facility] [address] [place], [place], [address] Phone: [phone]   Fax:  [phone]  Physical Therapy Treatment  Patient Details  Name: Joshua Browning MRN: [id number] Date of Birth: 11/24/1945 Referring Provider: Alonza Bogus, DO  Encounter Date: 07/26/2016      PT End of Session - 07/26/16 1258    Visit Number 2   Number of Visits 17   Date for PT Re-Evaluation 09/14/16   Authorization Type [employer] Medicare   PT Start Time [phone]   PT Stop Time 0930   PT Time Calculation (min) 38 min   Activity Tolerance Patient tolerated treatment well   Behavior During Therapy [facility] for tasks assessed/performed      Past Medical History:  Diagnosis Date  . BPH (benign prostatic hyperplasia)   . Foley catheter in place   . Nocturnal leg cramps   . Urinary retention   . Wears glasses     Past Surgical History:  Procedure Laterality Date  . CATARACT EXTRACTION W/ INTRAOCULAR LENS  IMPLANT, BILATERAL Bilateral   . INGUINAL HERNIA REPAIR Bilateral RIGHT 1984/  LEFT 2005  . RE-DO RIGHT INGUINAL HERNIA REPAIR AND UMBILICAL HERNIA REPAIR  2001  . REMOVAL LEFT EYE INTRAOCULAR LENS AND REPLACEMENT   08-29-2013  . TRANSURETHRAL RESECTION OF PROSTATE N/A 09/23/2013   Procedure: TRANSURETHRAL RESECTION OF THE PROSTATE (TURP) WITH GYRUS;  Surgeon: Claybon Jabs, MD;  Location: [place];  Service: Urology;  Laterality: N/A;    There were no vitals filed for this visit.      Subjective Assessment - 07/26/16 1252    Subjective Denies falls or changes.  States he feels like his legs have gotten a lot weaker in past year.  "My wife says I walk with my knees bent."     Patient Stated Goals Pt's goal for therapy is to improve balance and stair negotiation.   Currently in Pain? No/denies                         [facility] Adult PT Treatment/Exercise - 07/26/16 0001      Transfers   Transfers Sit to  Stand;Stand to Sit   Sit to Stand 5: Supervision;Without upper extremity assist;From chair/3-in-1   Stand to Sit 5: Supervision;Without upper extremity assist;To chair/3-in-1   Number of Reps 10 reps   Comments provided as HEP-cues for forward lean and maintaining weight shift during transfer.  Tends to lean posteriorly against mat.     Ambulation/Gait   Ambulation/Gait Yes   Ambulation/Gait Assistance 5: Supervision   Ambulation Distance (Feet) 110 Feet  >5 times during session   Assistive device None   Gait Pattern Step-through pattern;Decreased arm swing - right;Decreased step length - right;Narrow base of support   Ambulation Surface Level;Indoor   Gait Comments cues for knee extension with gait     Posture/Postural Control   Posture/Postural Control Postural limitations   Postural Limitations Forward head;Rounded Shoulders;Increased thoracic kyphosis     Exercises   Exercises Knee/Hip     Knee/Hip Exercises: Stretches   Active Hamstring Stretch Both;3 reps;30 seconds   Active Hamstring Stretch Limitations cues to maintain hold and proper positioning-provided as HEP     Knee/Hip Exercises: Aerobic   Other Aerobic Scifit level 2.5 all 4 extremities x 7 minutes     Knee/Hip Exercises: Machines for Strengthening   Total Gym Leg Press bil LE's x 60# x 15 x 2;Single  LE's 45# x 15-needs constant cues for control through out movement as well as terminal knee extension     Knee/Hip Exercises: Standing   Forward Step Up Both;15 reps;Hand Hold: 1;Step Height: 6";Other (comment)  provided as HEP                PT Education - 07/26/16 1257    Education provided Yes   Education Details HEP, transfer training initiated as part of HEP   Person(s) Educated Patient   Methods Explanation;Demonstration;Verbal cues;Handout   Comprehension Verbalized understanding;Returned demonstration          PT Short Term Goals - 07/15/16 1257      PT SHORT TERM GOAL #1   Title Pt will  be independent with HEP for improved balance, posture, functional mobility.  TARGET 08/14/16   Time 4   Period Weeks   Status New     PT SHORT TERM GOAL #2   Title Pt will improve 5x sit<>stand to less than or equal to 12.5 seconds for improved transfer efficiency and safety.   Time 4   Period Weeks   Status New     PT SHORT TERM GOAL #3   Title Pt will improve posterior step and release test to 2 steps or less with independent balance recovery, to demonstrate improved balance recovery in posterior direction.   Time 4   Period Weeks   Status New     PT SHORT TERM GOAL #4   Title Pt will negotiate at least 4 steps with intermittent UE support, while carrying object in one hand; 5 of 5 trials no LOB, for improved stair negotiation.   Time 4   Period Weeks   Status New     PT SHORT TERM GOAL #5   Title Pt will verbalize understanding of local Parkinson's disease related resources.   Time 4   Period Weeks   Status New           PT Long Term Goals - 07/15/16 1300      PT LONG TERM GOAL #1   Title Pt will verbalize understanding of fall prevention in home environment.  TARGET 09/14/16   Time 8   Period Weeks   Status New     PT LONG TERM GOAL #2   Title Pt will perform at least 8 of 10 reps of sit<>stand transfers, from less than 18 inch height, for improved transfer effciency and safety.   Time 4   Period Weeks   Status New     PT LONG TERM GOAL #3   Title Pt will improve Functional Gait Assessment score to at least 19/30 for decreased fall risk.   Time 8   Period Weeks   Status New     PT LONG TERM GOAL #4   Title Pt will verbalize plans for continued community fitness upon D/C from PT.   Time 8   Period Weeks   Status New     PT LONG TERM GOAL #5   Title Pt will report at least 50% improvement in performance of squatting to low surfaces and getting onto/up from floor.   Time 8   Period Weeks   Status New               Plan - 07/26/16 1258     Clinical Impression Statement Pt session focused on initiating LE HEP.  Continue PT per POC.   Rehab Potential Good   PT Frequency 2x / week  PT Duration 8 weeks   PT Treatment/Interventions ADLs/Self Care Home Management;Functional mobility training;Stair training;Gait training;Therapeutic activities;Therapeutic exercise;Neuromuscular re-education;Balance training;Patient/family education   PT Next Visit Plan Review HEP.  Transfer training from chair>progressing to lower surfaces, squats; stair negotiation carrying objects; PWR! Moves sitting and standing; step strategies in varied directions   Consulted and Agree with Plan of Care Patient      Patient will benefit from skilled therapeutic intervention in order to improve the following deficits and impairments:  Abnormal gait, Decreased balance, Decreased mobility, Decreased strength, Difficulty walking, Impaired flexibility, Postural dysfunction  Visit Diagnosis: Other abnormalities of gait and mobility  Unsteadiness on feet  Abnormal posture     Problem List Patient Active Problem List   Diagnosis Date Noted  . BPH (benign prostatic hypertrophy) with urinary retention 09/23/2013    Narda Bonds 07/26/2016, 1:00 PM  [facility] [address] [place], [place], [address] Phone: [phone]   Fax:  [phone]  Name: Joshua Browning MRN: [id number] Date of Birth: 10/30/1945   Narda Bonds, [place] 07/26/16 1:00 PM Phone: [phone] Fax: [phone]

## 2016-07-28 ENCOUNTER — Ambulatory Visit: Payer: Medicare Other | Admitting: Physical Therapy

## 2016-07-28 DIAGNOSIS — R2689 Other abnormalities of gait and mobility: Secondary | ICD-10-CM | POA: Diagnosis not present

## 2016-07-28 DIAGNOSIS — R293 Abnormal posture: Secondary | ICD-10-CM

## 2016-07-28 DIAGNOSIS — R2681 Unsteadiness on feet: Secondary | ICD-10-CM

## 2016-07-28 NOTE — Therapy (Signed)
[facility] [address] [place], [place], [address] Phone: [phone]   Fax:  [phone]  Physical Therapy Treatment  Patient Details  Name: Joshua Browning MRN: [id number] Date of Birth: May 01, 1946 Referring Provider: Alonza Bogus, DO  Encounter Date: 07/28/2016      PT End of Session - 07/28/16 1541    Visit Number 3   Number of Visits 17   Date for PT Re-Evaluation 09/14/16   Authorization Type [employer] Medicare   PT Start Time 1448   PT Stop Time 1535   PT Time Calculation (min) 47 min   Activity Tolerance Patient tolerated treatment well   Behavior During Therapy [facility] for tasks assessed/performed      Past Medical History:  Diagnosis Date  . BPH (benign prostatic hyperplasia)   . Foley catheter in place   . Nocturnal leg cramps   . Urinary retention   . Wears glasses     Past Surgical History:  Procedure Laterality Date  . CATARACT EXTRACTION W/ INTRAOCULAR LENS  IMPLANT, BILATERAL Bilateral   . INGUINAL HERNIA REPAIR Bilateral RIGHT 1984/  LEFT 2005  . RE-DO RIGHT INGUINAL HERNIA REPAIR AND UMBILICAL HERNIA REPAIR  2001  . REMOVAL LEFT EYE INTRAOCULAR LENS AND REPLACEMENT   08-29-2013  . TRANSURETHRAL RESECTION OF PROSTATE N/A 09/23/2013   Procedure: TRANSURETHRAL RESECTION OF THE PROSTATE (TURP) WITH GYRUS;  Surgeon: Claybon Jabs, MD;  Location: [facility];  Service: Urology;  Laterality: N/A;    There were no vitals filed for this visit.      Subjective Assessment - 07/28/16 1505    Subjective Denies falls or changes.  Did try the exercises and feels like stretches are hard (hamstring).   Patient Stated Goals Pt's goal for therapy is to improve balance and stair negotiation.   Currently in Pain? No/denies      Supine bil hamstring stretch, contract/relax x 3 minutes total each side. SAQ bil LE x 10 reps x 2 with 3# weight. LAQ bil LE x 10 reps x 2 with 3#  weight.    Sit<>stand x 10 from 20" mat and x 10 from 18" chair for LE strengthening and functional training  PWR! Moves basic 4 in sitting x 20 reps with cues for technique and intensity.  Provided as HEP.       PT Education - 07/28/16 1540    Education provided Yes   Education Details Seated PWR! moves, You tube PWR! moves as reinforcement at home   Person(s) Educated Patient   Methods Explanation;Demonstration;Verbal cues;Handout   Comprehension Verbalized understanding;Need further instruction          PT Short Term Goals - 07/15/16 1257      PT SHORT TERM GOAL #1   Title Pt will be independent with HEP for improved balance, posture, functional mobility.  TARGET 08/14/16   Time 4   Period Weeks   Status New     PT SHORT TERM GOAL #2   Title Pt will improve 5x sit<>stand to less than or equal to 12.5 seconds for improved transfer efficiency and safety.   Time 4   Period Weeks   Status New     PT SHORT TERM GOAL #3   Title Pt will improve posterior step and release test to 2 steps or less with independent balance recovery, to demonstrate improved balance recovery in posterior direction.   Time 4   Period Weeks   Status New  PT SHORT TERM GOAL #4   Title Pt will negotiate at least 4 steps with intermittent UE support, while carrying object in one hand; 5 of 5 trials no LOB, for improved stair negotiation.   Time 4   Period Weeks   Status New     PT SHORT TERM GOAL #5   Title Pt will verbalize understanding of local Parkinson's disease related resources.   Time 4   Period Weeks   Status New           PT Long Term Goals - 07/15/16 1300      PT LONG TERM GOAL #1   Title Pt will verbalize understanding of fall prevention in home environment.  TARGET 09/14/16   Time 8   Period Weeks   Status New     PT LONG TERM GOAL #2   Title Pt will perform at least 8 of 10 reps of sit<>stand transfers, from less than 18 inch height, for improved transfer effciency  and safety.   Time 4   Period Weeks   Status New     PT LONG TERM GOAL #3   Title Pt will improve Functional Gait Assessment score to at least 19/30 for decreased fall risk.   Time 8   Period Weeks   Status New     PT LONG TERM GOAL #4   Title Pt will verbalize plans for continued community fitness upon D/C from PT.   Time 8   Period Weeks   Status New     PT LONG TERM GOAL #5   Title Pt will report at least 50% improvement in performance of squatting to low surfaces and getting onto/up from floor.   Time 8   Period Weeks   Status New               Plan - 07/28/16 1542    Clinical Impression Statement Continue to provide HEP and reinforcement for intensity.  Pt tends to get side tracked at times during session and needs redirection.  Continue PT per POC.   Rehab Potential Good   PT Frequency 2x / week   PT Duration 8 weeks   PT Treatment/Interventions ADLs/Self Care Home Management;Functional mobility training;Stair training;Gait training;Therapeutic activities;Therapeutic exercise;Neuromuscular re-education;Balance training;Patient/family education   PT Next Visit Plan Review seated PWR! Moves.  Initiate standing PWR! moves.  stair negotiation carrying objects; step strategies in varied directions   Consulted and Agree with Plan of Care Patient      Patient will benefit from skilled therapeutic intervention in order to improve the following deficits and impairments:  Abnormal gait, Decreased balance, Decreased mobility, Decreased strength, Difficulty walking, Impaired flexibility, Postural dysfunction  Visit Diagnosis: Other abnormalities of gait and mobility  Unsteadiness on feet  Abnormal posture     Problem List Patient Active Problem List   Diagnosis Date Noted  . BPH (benign prostatic hypertrophy) with urinary retention 09/23/2013    Joshua Browning 07/28/2016, 3:49 PM  [facility] [address] [place], [place], [address] Phone: [phone]   Fax:  [phone]  Name: Joshua Browning MRN: [id number] Date of Birth: Jun 20, 1946  [place], [place] 07/28/16 3:49 PM Phone: [phone] Fax: [phone]

## 2016-07-28 NOTE — Patient Instructions (Signed)
Provided PWR! Seated x 20 reps each basic 4 moves once a day

## 2016-08-03 ENCOUNTER — Ambulatory Visit: Payer: Medicare Other | Admitting: Physical Therapy

## 2016-08-03 ENCOUNTER — Encounter: Payer: Self-pay | Admitting: Physical Therapy

## 2016-08-03 DIAGNOSIS — R293 Abnormal posture: Secondary | ICD-10-CM

## 2016-08-03 DIAGNOSIS — R2681 Unsteadiness on feet: Secondary | ICD-10-CM

## 2016-08-03 DIAGNOSIS — R2689 Other abnormalities of gait and mobility: Secondary | ICD-10-CM | POA: Diagnosis not present

## 2016-08-03 NOTE — Therapy (Signed)
[facility] [address] [place], [place], [address] Phone: [phone]   Fax:  [phone]  Physical Therapy Treatment  Patient Details  Name: Joshua Browning MRN: [id number] Date of Birth: 08-18-1945 Referring Provider: Alonza Bogus, DO  Encounter Date: 08/03/2016      PT End of Session - 08/03/16 1545    Visit Number 4   Number of Visits 17   Date for PT Re-Evaluation 09/14/16   Authorization Type [employer] Medicare   PT Start Time 1450   PT Stop Time 1530   PT Time Calculation (min) 40 min   Activity Tolerance Patient tolerated treatment well   Behavior During Therapy [facility] for tasks assessed/performed      Past Medical History:  Diagnosis Date  . BPH (benign prostatic hyperplasia)   . Foley catheter in place   . Nocturnal leg cramps   . Urinary retention   . Wears glasses     Past Surgical History:  Procedure Laterality Date  . CATARACT EXTRACTION W/ INTRAOCULAR LENS  IMPLANT, BILATERAL Bilateral   . INGUINAL HERNIA REPAIR Bilateral RIGHT 1984/  LEFT 2005  . RE-DO RIGHT INGUINAL HERNIA REPAIR AND UMBILICAL HERNIA REPAIR  2001  . REMOVAL LEFT EYE INTRAOCULAR LENS AND REPLACEMENT   08-29-2013  . TRANSURETHRAL RESECTION OF PROSTATE N/A 09/23/2013   Procedure: TRANSURETHRAL RESECTION OF THE PROSTATE (TURP) WITH GYRUS;  Surgeon: Claybon Jabs, MD;  Location: [facility];  Service: Urology;  Laterality: N/A;    There were no vitals filed for this visit.      Subjective Assessment - 08/03/16 1452    Subjective Came from work at [employer].   Patient Stated Goals Pt's goal for therapy is to improve balance and stair negotiation.   Currently in Pain? No/denies                         [facility] Adult PT Treatment/Exercise - 08/03/16 0001      Ambulation/Gait   Stairs Yes   Stairs Assistance 5: Supervision   Stairs Assistance Details (indicate cue type and reason)  practised carrying #10 weight with both hands, x5 no LOB, cues to clear feet better when turning.   Stair Management Technique No rails   Number of Stairs 4  x5           PWR [facility]) - 08/03/16 1504    PWR! STEP in QUADRUPED 5x2   Comments cues for technquie and posture.     STANDING  PWR! Up   x20   PWR! [employer] Twist x20   PWR! Step x20   Comments cues for posture and technique             PT Education - 08/03/16 1544    Education provided Yes   Education Details Reviewed and performed PWR! Moves in sitting.  Enocuraged pt to use recumbent bike at home for aerobic activity.   Person(s) Educated Patient   Methods Explanation;Demonstration;Verbal cues;Handout   Comprehension Verbalized understanding;Returned demonstration;Verbal cues required          PT Short Term Goals - 07/15/16 1257      PT SHORT TERM GOAL #1   Title Pt will be independent with HEP for improved balance, posture, functional mobility.  TARGET 08/14/16   Time 4   Period Weeks   Status New     PT SHORT TERM GOAL #2   Title Pt  will improve 5x sit<>stand to less than or equal to 12.5 seconds for improved transfer efficiency and safety.   Time 4   Period Weeks   Status New     PT SHORT TERM GOAL #3   Title Pt will improve posterior step and release test to 2 steps or less with independent balance recovery, to demonstrate improved balance recovery in posterior direction.   Time 4   Period Weeks   Status New     PT SHORT TERM GOAL #4   Title Pt will negotiate at least 4 steps with intermittent UE support, while carrying object in one hand; 5 of 5 trials no LOB, for improved stair negotiation.   Time 4   Period Weeks   Status New     PT SHORT TERM GOAL #5   Title Pt will verbalize understanding of local Parkinson's disease related resources.   Time 4   Period Weeks   Status New           PT Long Term Goals - 07/15/16 1300      PT LONG TERM GOAL #1   Title Pt will  verbalize understanding of fall prevention in home environment.  TARGET 09/14/16   Time 8   Period Weeks   Status New     PT LONG TERM GOAL #2   Title Pt will perform at least 8 of 10 reps of sit<>stand transfers, from less than 18 inch height, for improved transfer effciency and safety.   Time 4   Period Weeks   Status New     PT LONG TERM GOAL #3   Title Pt will improve Functional Gait Assessment score to at least 19/30 for decreased fall risk.   Time 8   Period Weeks   Status New     PT LONG TERM GOAL #4   Title Pt will verbalize plans for continued community fitness upon D/C from PT.   Time 8   Period Weeks   Status New     PT LONG TERM GOAL #5   Title Pt will report at least 50% improvement in performance of squatting to low surfaces and getting onto/up from floor.   Time 8   Period Weeks   Status New               Plan - 08/03/16 1546    Clinical Impression Statement Pt hadn't performed PWR! Moves at home yet due to business.  Performed Seated PWR! Moves is sitting with Min-Mod cues.  Progressed with PWR! Step transition in quadruped, pt performed at supervision level but balance, endurance, and flexibility were challenged.                    Rehab Potential Good   PT Frequency 2x / week   PT Duration 8 weeks   PT Treatment/Interventions ADLs/Self Care Home Management;Functional mobility training;Stair training;Gait training;Therapeutic activities;Therapeutic exercise;Neuromuscular re-education;Balance training;Patient/family education   PT Next Visit Plan Review seated PWR! Moves.  Initiate standing PWR! moves.  stair negotiation carrying objects; step strategies in varied directions   Consulted and Agree with Plan of Care Patient      Patient will benefit from skilled therapeutic intervention in order to improve the following deficits and impairments:  Abnormal gait, Decreased balance, Decreased mobility, Decreased strength, Difficulty walking, Impaired  flexibility, Postural dysfunction  Visit Diagnosis: Other abnormalities of gait and mobility  Unsteadiness on feet  Abnormal posture     Problem List Patient Active Problem  List   Diagnosis Date Noted  . BPH (benign prostatic hypertrophy) with urinary retention 09/23/2013    Joshua Browning, PTA  08/03/16, 3:53 PM [facility] [address] [place], [place], [address] Phone: [phone]   Fax:  [phone]  Name: Joshua Browning MRN: [id number] Date of Birth: [birth year]

## 2016-08-04 ENCOUNTER — Ambulatory Visit: Payer: Medicare Other | Admitting: Physical Therapy

## 2016-08-04 ENCOUNTER — Encounter: Payer: Self-pay | Admitting: Physical Therapy

## 2016-08-04 DIAGNOSIS — R2689 Other abnormalities of gait and mobility: Secondary | ICD-10-CM

## 2016-08-04 DIAGNOSIS — R293 Abnormal posture: Secondary | ICD-10-CM

## 2016-08-04 DIAGNOSIS — R2681 Unsteadiness on feet: Secondary | ICD-10-CM

## 2016-08-04 NOTE — Patient Instructions (Addendum)
Heel, Standing    Put ball of foot on 2"block with other foot beside on the floor. Feel rear calf stretch. Hold _30__ seconds. Repeat _2__ times per session. Do _1-2__ sessions per day.  Copyright  VHI. All rights reserved.

## 2016-08-04 NOTE — Therapy (Signed)
[facility] [address] [place], [place], [address] Phone: [phone]   Fax:  [phone]  Physical Therapy Treatment  Patient Details  Name: Joshua Browning MRN: [id number] Date of Birth: [birth year] Referring Provider: Alonza Bogus, DO  Encounter Date: 08/04/2016      PT End of Session - 08/04/16 1539    Visit Number 5   Number of Visits 17   Date for PT Re-Evaluation 09/14/16   Authorization Type [employer] Medicare   PT Start Time 1450   PT Stop Time 1530   PT Time Calculation (min) 40 min   Activity Tolerance Patient tolerated treatment well   Behavior During Therapy [facility] for tasks assessed/performed      Past Medical History:  Diagnosis Date  . BPH (benign prostatic hyperplasia)   . Foley catheter in place   . Nocturnal leg cramps   . Urinary retention   . Wears glasses     Past Surgical History:  Procedure Laterality Date  . CATARACT EXTRACTION W/ INTRAOCULAR LENS  IMPLANT, BILATERAL Bilateral   . INGUINAL HERNIA REPAIR Bilateral RIGHT 1984/  LEFT 2005  . RE-DO RIGHT INGUINAL HERNIA REPAIR AND UMBILICAL HERNIA REPAIR  2001  . REMOVAL LEFT EYE INTRAOCULAR LENS AND REPLACEMENT   08-29-2013  . TRANSURETHRAL RESECTION OF PROSTATE N/A 09/23/2013   Procedure: TRANSURETHRAL RESECTION OF THE PROSTATE (TURP) WITH GYRUS;  Surgeon: Claybon Jabs, MD;  Location: [facility];  Service: Urology;  Laterality: N/A;    There were no vitals filed for this visit.      Subjective Assessment - 08/04/16 1452    Subjective Worked today. Reports memory problems that are frustrating to him at home and at work.   Patient Stated Goals Pt's goal for therapy is to improve balance and stair negotiation.   Currently in Pain? No/denies                         [facility] Adult PT Treatment/Exercise - 08/04/16 0001      Knee/Hip Exercises: Aerobic   Other Aerobic Scifit level 4.0 to 6.0 all 4 extremities x  12 minutes     Knee/Hip Exercises: Standing   Step Down Both;1 set;10 reps;Hand Hold: 0;Step Height: 6"  Min guard   Wall Squat 1 set;10 reps;5 seconds  with red therapy ball behind           PWR [facility]) - 08/04/16 1540 STANDING   PWR! Up x10   PWR! Rock x10   PWR! Twist x10   PWR Step x10   Comments cues for technique and posture, no UE support needed.            PT Short Term Goals - 07/15/16 1257      PT SHORT TERM GOAL #1   Title Pt will be independent with HEP for improved balance, posture, functional mobility.  TARGET 08/14/16   Time 4   Period Weeks   Status New     PT SHORT TERM GOAL #2   Title Pt will improve 5x sit<>stand to less than or equal to 12.5 seconds for improved transfer efficiency and safety.   Time 4   Period Weeks   Status New     PT SHORT TERM GOAL #3   Title Pt will improve posterior step and release test to 2 steps or less with independent balance recovery, to demonstrate improved balance recovery in posterior direction.   Time 4  Period Weeks   Status New     PT SHORT TERM GOAL #4   Title Pt will negotiate at least 4 steps with intermittent UE support, while carrying object in one hand; 5 of 5 trials no LOB, for improved stair negotiation.   Time 4   Period Weeks   Status New     PT SHORT TERM GOAL #5   Title Pt will verbalize understanding of local Parkinson's disease related resources.   Time 4   Period Weeks   Status New           PT Long Term Goals - 07/15/16 1300      PT LONG TERM GOAL #1   Title Pt will verbalize understanding of fall prevention in home environment.  TARGET 09/14/16   Time 8   Period Weeks   Status New     PT LONG TERM GOAL #2   Title Pt will perform at least 8 of 10 reps of sit<>stand transfers, from less than 18 inch height, for improved transfer effciency and safety.   Time 4   Period Weeks   Status New     PT LONG TERM GOAL #3   Title Pt will improve Functional Gait Assessment score to  at least 19/30 for decreased fall risk.   Time 8   Period Weeks   Status New     PT LONG TERM GOAL #4   Title Pt will verbalize plans for continued community fitness upon D/C from PT.   Time 8   Period Weeks   Status New     PT LONG TERM GOAL #5   Title Pt will report at least 50% improvement in performance of squatting to low surfaces and getting onto/up from floor.   Time 8   Period Weeks   Status New               Plan - 08/04/16 1541    Clinical Impression Statement Progressed with standing PWR! Moves; pt required min cues for technique and did not need UE support. Pt demonstrated good balance and strength with step downs from 6" block with no UE support.   Rehab Potential Good   PT Frequency 2x / week   PT Duration 8 weeks   PT Treatment/Interventions ADLs/Self Care Home Management;Functional mobility training;Stair training;Gait training;Therapeutic activities;Therapeutic exercise;Neuromuscular re-education;Balance training;Patient/family education   PT Next Visit Plan Review seated PWR! Moves.  Initiate standing PWR! moves.  stair negotiation carrying objects; step strategies in varied directions   Consulted and Agree with Plan of Care Patient      Patient will benefit from skilled therapeutic intervention in order to improve the following deficits and impairments:  Abnormal gait, Decreased balance, Decreased mobility, Decreased strength, Difficulty walking, Impaired flexibility, Postural dysfunction  Visit Diagnosis: Other abnormalities of gait and mobility  Unsteadiness on feet  Abnormal posture     Problem List Patient Active Problem List   Diagnosis Date Noted  . BPH (benign prostatic hypertrophy) with urinary retention 09/23/2013    Bjorn Loser, PTA  08/04/16, 3:47 PM [facility] [address] [place], [place], [address] Phone: [phone]   Fax:  [phone]  Name: Joshua Browning MRN: [id number] Date of Birth: 1946-05-30

## 2016-08-10 ENCOUNTER — Ambulatory Visit: Payer: Medicare Other | Admitting: Physical Therapy

## 2016-08-10 DIAGNOSIS — R293 Abnormal posture: Secondary | ICD-10-CM

## 2016-08-10 DIAGNOSIS — R2689 Other abnormalities of gait and mobility: Secondary | ICD-10-CM | POA: Diagnosis not present

## 2016-08-10 NOTE — Therapy (Signed)
[facility] [address] [place], [place], [address] Phone: [phone]   Fax:  [phone]  Physical Therapy Treatment  Patient Details  Name: Joshua Browning MRN: [id number] Date of Birth: November 07, 1945 Referring Provider: Alonza Bogus, DO  Encounter Date: 08/10/2016      PT End of Session - 08/10/16 1631    Visit Number 6   Number of Visits 17   Date for PT Re-Evaluation 09/14/16   Authorization Type [employer] Medicare   PT Start Time 1532   PT Stop Time 1615   PT Time Calculation (min) 43 min   Activity Tolerance Patient tolerated treatment well   Behavior During Therapy [facility] for tasks assessed/performed      Past Medical History:  Diagnosis Date  . BPH (benign prostatic hyperplasia)   . Foley catheter in place   . Nocturnal leg cramps   . Urinary retention   . Wears glasses     Past Surgical History:  Procedure Laterality Date  . CATARACT EXTRACTION W/ INTRAOCULAR LENS  IMPLANT, BILATERAL Bilateral   . INGUINAL HERNIA REPAIR Bilateral RIGHT 1984/  LEFT 2005  . RE-DO RIGHT INGUINAL HERNIA REPAIR AND UMBILICAL HERNIA REPAIR  2001  . REMOVAL LEFT EYE INTRAOCULAR LENS AND REPLACEMENT   08-29-2013  . TRANSURETHRAL RESECTION OF PROSTATE N/A 09/23/2013   Procedure: TRANSURETHRAL RESECTION OF THE PROSTATE (TURP) WITH GYRUS;  Surgeon: Claybon Jabs, MD;  Location: [facility];  Service: Urology;  Laterality: N/A;    There were no vitals filed for this visit.      Subjective Assessment - 08/10/16 1534    Subjective Have had a busy time with the holidays.  No falls.  Didn't get to fit in all my exercises due to the holidays and travel.   Patient Stated Goals Pt's goal for therapy is to improve balance and stair negotiation.   Currently in Pain? No/denies                         [facility] Adult PT Treatment/Exercise - 08/10/16 0001      Transfers   Transfers Sit to Stand;Stand to Sit   Sit to Stand 6: Modified independent (Device/Increase time)   Stand to Sit 6: Modified independent (Device/Increase time)   Number of Reps 10 reps     Ambulation/Gait   Ambulation/Gait Yes   Ambulation/Gait Assistance 5: Supervision   Ambulation/Gait Assistance Details Cues for upright posture, for arm swing and step length   Ambulation Distance (Feet) 700 Feet   Assistive device None   Gait Pattern Step-through pattern;Decreased arm swing - right;Decreased step length - right;Narrow base of support  step length, arm swing improves with cues   Ambulation Surface Level;Indoor     Exercises   Exercises Knee/Hip;Other Exercises   Other Exercises  Review of forward step ups and forward step downs x 10 reps with UE support-pt return demo understanding     Knee/Hip Exercises: Stretches   Active Hamstring Stretch Both;2 reps;30 seconds   Active Hamstring Stretch Limitations Reviewed hamstring stretch as part of HEP           PWR [facility]) - 08/10/16 1546    PWR! exercises Moves in sitting;Moves in standing   PWR! Up x 20   PWR! Rock x 10 reps each side   PWR! Twist x 10 reps each side   PWR Step x 10 reps each   Comments In standing:  Verbal  and visual cues for technique and intensity.   PWR! Up x 20 reps   PWR! Rock x 10 reps each side   PWR! Twist x 10 reps each side   PWR! Step x 10 reps each side   Comments In sitting:  Verbal cues for intensity of movement patterns and to incorporate head turns, visual targets     Spent time discussing how each PWR! Move relates to functional mobility and work activities, as well as how to incorporate modified PWR! Moves to offset sedentary, seated activities during work time.        PT Education - 08/10/16 1630    Education provided Yes   Education Details HEP for [employer]! Moves in standing-see instructions   Person(s) Educated Patient   Methods Explanation;Demonstration;Handout   Comprehension Verbalized understanding;Returned  demonstration;Verbal cues required;Need further instruction          PT Short Term Goals - 07/15/16 1257      PT SHORT TERM GOAL #1   Title Pt will be independent with HEP for improved balance, posture, functional mobility.  TARGET 08/14/16   Time 4   Period Weeks   Status New     PT SHORT TERM GOAL #2   Title Pt will improve 5x sit<>stand to less than or equal to 12.5 seconds for improved transfer efficiency and safety.   Time 4   Period Weeks   Status New     PT SHORT TERM GOAL #3   Title Pt will improve posterior step and release test to 2 steps or less with independent balance recovery, to demonstrate improved balance recovery in posterior direction.   Time 4   Period Weeks   Status New     PT SHORT TERM GOAL #4   Title Pt will negotiate at least 4 steps with intermittent UE support, while carrying object in one hand; 5 of 5 trials no LOB, for improved stair negotiation.   Time 4   Period Weeks   Status New     PT SHORT TERM GOAL #5   Title Pt will verbalize understanding of local Parkinson's disease related resources.   Time 4   Period Weeks   Status New           PT Long Term Goals - 07/15/16 1300      PT LONG TERM GOAL #1   Title Pt will verbalize understanding of fall prevention in home environment.  TARGET 09/14/16   Time 8   Period Weeks   Status New     PT LONG TERM GOAL #2   Title Pt will perform at least 8 of 10 reps of sit<>stand transfers, from less than 18 inch height, for improved transfer effciency and safety.   Time 4   Period Weeks   Status New     PT LONG TERM GOAL #3   Title Pt will improve Functional Gait Assessment score to at least 19/30 for decreased fall risk.   Time 8   Period Weeks   Status New     PT LONG TERM GOAL #4   Title Pt will verbalize plans for continued community fitness upon D/C from PT.   Time 8   Period Weeks   Status New     PT LONG TERM GOAL #5   Title Pt will report at least 50% improvement in  performance of squatting to low surfaces and getting onto/up from floor.   Time 8   Period Weeks   Status New  Plan - 08/10/16 1631    Clinical Impression Statement Added standing PWR! Moves to HEP.  Focused time during session today connecting specific exercises to functional activities and to how patient can utilize exercise in work activities.  Pt will continue to benefit from skilled PT to address balance and gait.   Rehab Potential Good   PT Frequency 2x / week   PT Duration 8 weeks   PT Treatment/Interventions ADLs/Self Care Home Management;Functional mobility training;Stair training;Gait training;Therapeutic activities;Therapeutic exercise;Neuromuscular re-education;Balance training;Patient/family education   PT Next Visit Plan Review standing PWR! Moves.  Check STGs (discuss additional appointments); work on step strategies in varied directions and gait with intensity   Consulted and Agree with Plan of Care Patient      Patient will benefit from skilled therapeutic intervention in order to improve the following deficits and impairments:  Abnormal gait, Decreased balance, Decreased mobility, Decreased strength, Difficulty walking, Impaired flexibility, Postural dysfunction  Visit Diagnosis: Other abnormalities of gait and mobility  Abnormal posture     Problem List Patient Active Problem List   Diagnosis Date Noted  . BPH (benign prostatic hypertrophy) with urinary retention 09/23/2013    Kaniya Trueheart W. 08/10/2016, 4:35 PM  Frazier Butt., PT  [facility] [address] [place], [place], [address] Phone: [phone]   Fax:  [phone]  Name: HANY GRANDE MRN: [id number] Date of Birth: 22-Oct-1945

## 2016-08-16 ENCOUNTER — Ambulatory Visit: Payer: Medicare Other | Admitting: Physical Therapy

## 2016-08-17 ENCOUNTER — Ambulatory Visit: Payer: Medicare Other | Attending: Neurology | Admitting: Physical Therapy

## 2016-08-17 DIAGNOSIS — R2681 Unsteadiness on feet: Secondary | ICD-10-CM | POA: Diagnosis present

## 2016-08-17 DIAGNOSIS — R293 Abnormal posture: Secondary | ICD-10-CM | POA: Insufficient documentation

## 2016-08-17 DIAGNOSIS — R2689 Other abnormalities of gait and mobility: Secondary | ICD-10-CM | POA: Diagnosis not present

## 2016-08-17 NOTE — Therapy (Signed)
[facility] [address] [place], [place], [address] Phone: [phone]   Fax:  [phone]  Physical Therapy Treatment  Patient Details  Name: Joshua Browning MRN: [id number] Date of Birth: 1946/02/10 Referring Provider: Alonza Bogus, DO  Encounter Date: 08/17/2016      PT End of Session - 08/17/16 1953    Visit Number 7   Number of Visits 17   Date for PT Re-Evaluation 09/14/16   Authorization Type [employer]HC Medicare   PT Start Time 1533   PT Stop Time 1618   PT Time Calculation (min) 45 min      Past Medical History:  Diagnosis Date  . BPH (benign prostatic hyperplasia)   . Foley catheter in place   . Nocturnal leg cramps   . Urinary retention   . Wears glasses     Past Surgical History:  Procedure Laterality Date  . CATARACT EXTRACTION W/ INTRAOCULAR LENS  IMPLANT, BILATERAL Bilateral   . INGUINAL HERNIA REPAIR Bilateral RIGHT 1984/  LEFT 2005  . RE-DO RIGHT INGUINAL HERNIA REPAIR AND UMBILICAL HERNIA REPAIR  2001  . REMOVAL LEFT EYE INTRAOCULAR LENS AND REPLACEMENT   08-29-2013  . TRANSURETHRAL RESECTION OF PROSTATE N/A 09/23/2013   Procedure: TRANSURETHRAL RESECTION OF THE PROSTATE (TURP) WITH GYRUS;  Surgeon: Claybon Jabs, MD;  Location: [facility];  Service: Urology;  Laterality: N/A;    There were no vitals filed for this visit.      Subjective Assessment - 08/17/16 1939    Subjective Pt reports he has been doing exercises at home during holiday period - has also been riding recumbent bike at home   Patient Stated Goals Pt's goal for therapy is to improve balance and stair negotiation.   Currently in Pain? No/denies                         [facility] Adult PT Treatment/Exercise - 08/17/16 1555      Transfers   Transfers Sit to Stand;Stand to Sit   Sit to Stand 6: Modified independent (Device/Increase time)   Five time sit to stand comments  10.06   Stand to Sit 6:  Modified independent (Device/Increase time)   Number of Reps Other reps (comment)  5 reps     Ambulation/Gait   Ambulation/Gait Yes   Ambulation/Gait Assistance 5: Supervision   Ambulation/Gait Assistance Details pt held blue ball overhead to fascilitate erect posture and to decr. trunk flexion with looking down during gait- 30' x 4 reps;  120' with cues for incr. step length and incr. arm swing   Ambulation Distance (Feet) 120 Feet   Assistive device None   Gait Pattern Step-through pattern   Ambulation Surface Level;Indoor   Stairs Yes   Stairs Assistance 5: Supervision   Stairs Assistance Details (indicate cue type and reason) carried 2 kg ball for object in assessing STG  minimal UE support used   Stair Management Technique Alternating pattern   Number of Stairs 4  5 reps   Height of Stairs 6           PWR [facility]) - 08/17/16 1949    PWR! Up x10   PWR! Twist x10 reps to each side   PWR Step x 10 reps to each side   Basic 4 Flow Pt performed posterior stepping with weight shift wiithout UE support - 10 reps each side     Pt performed Y stretch against wall  to fascilitate upright posture and for streching - 30 sec hold x 2 reps W stretch for pectoral stretching to decr. Rounded shoulders - 30 sec hold x 2 reps        PT Education - 08/17/16 1952    Education provided Yes   Education Details will add posterior step PWR! to HEP; also add Y and W stretch for posture   Person(s) Educated Patient   Methods Explanation;Demonstration   Comprehension Verbalized understanding          PT Short Term Goals - 08/17/16 1941      PT SHORT TERM GOAL #1   Title Pt will be independent with HEP for improved balance, posture, functional mobility.  TARGET 08/14/16   Baseline pt reports he has been doing exercises through the holidays   Time 4   Period Weeks   Status Achieved     PT SHORT TERM GOAL #2   Title Pt will improve 5x sit<>stand to less than or equal to 12.5 seconds  for improved transfer efficiency and safety.   Baseline 10.06 secs - 08-17-16   Time 4   Status Achieved     PT SHORT TERM GOAL #3   Title Pt will improve posterior step and release test to 2 steps or less with independent balance recovery, to demonstrate improved balance recovery in posterior direction.   Baseline approx. 3-4 steps taken for balance recovery - 08-17-16   Time 4   Period Weeks   Status Not Met     PT SHORT TERM GOAL #4   Title Pt will negotiate at least 4 steps with intermittent UE support, while carrying object in one hand; 5 of 5 trials no LOB, for improved stair negotiation.   Baseline carried 2 kg ball up/down steps - 08-17-16   Time 4   Period Weeks   Status Achieved     PT SHORT TERM GOAL #5   Title Pt will verbalize understanding of local Parkinson's disease related resources.   Baseline Pt unsure of available resources - 08-17-16   Time 4   Status On-going           PT Long Term Goals - 07/15/16 1300      PT LONG TERM GOAL #1   Title Pt will verbalize understanding of fall prevention in home environment.  TARGET 09/14/16   Time 8   Period Weeks   Status New     PT LONG TERM GOAL #2   Title Pt will perform at least 8 of 10 reps of sit<>stand transfers, from less than 18 inch height, for improved transfer effciency and safety.   Time 4   Period Weeks   Status New     PT LONG TERM GOAL #3   Title Pt will improve Functional Gait Assessment score to at least 19/30 for decreased fall risk.   Time 8   Period Weeks   Status New     PT LONG TERM GOAL #4   Title Pt will verbalize plans for continued community fitness upon D/C from PT.   Time 8   Period Weeks   Status New     PT LONG TERM GOAL #5   Title Pt will report at least 50% improvement in performance of squatting to low surfaces and getting onto/up from floor.   Time 8   Period Weeks   Status New               Plan - 08/17/16 1953  Clinical Impression Statement Pt met STG's #1,2  and 4:  #3 not met as pt required 3-4 steps posteriorly to recover balance in posterior step and release test; #5 not met as pt unaware of Parkinson's resources - this goal is ongoing   Rehab Potential Good   PT Frequency 2x / week   PT Duration 8 weeks   PT Treatment/Interventions ADLs/Self Care Home Management;Functional mobility training;Stair training;Gait training;Therapeutic activities;Therapeutic exercise;Neuromuscular re-education;Balance training;Patient/family education   PT Next Visit Plan Cont balance and standing PWR! moves; give pics for posterior PWR step and 2 wall stretches   Consulted and Agree with Plan of Care Patient      Patient will benefit from skilled therapeutic intervention in order to improve the following deficits and impairments:  Abnormal gait, Decreased balance, Decreased mobility, Decreased strength, Difficulty walking, Impaired flexibility, Postural dysfunction  Visit Diagnosis: Other abnormalities of gait and mobility     Problem List Patient Active Problem List   Diagnosis Date Noted  . BPH (benign prostatic hypertrophy) with urinary retention 09/23/2013    Alda Lea, PT 08/17/2016, 8:00 PM  [facility] [address] [place], [place], [address] Phone: [phone]   Fax:  [phone]  Name: Joshua Browning MRN: [id number] Date of Birth: [birth year]

## 2016-08-18 ENCOUNTER — Ambulatory Visit: Payer: Medicare Other | Admitting: Physical Therapy

## 2016-08-18 DIAGNOSIS — R293 Abnormal posture: Secondary | ICD-10-CM

## 2016-08-18 DIAGNOSIS — R2689 Other abnormalities of gait and mobility: Secondary | ICD-10-CM

## 2016-08-18 DIAGNOSIS — R2681 Unsteadiness on feet: Secondary | ICD-10-CM

## 2016-08-18 NOTE — Therapy (Signed)
[facility] [address] [place], [place], [address] Phone: [phone]   Fax:  [phone]  Physical Therapy Treatment  Patient Details  Name: Joshua Browning MRN: [id number] Date of Birth: 23-Oct-1945 Referring Provider: Alonza Bogus, DO  Encounter Date: 08/18/2016      PT End of Session - 08/18/16 1532    Visit Number 8   Number of Visits 17   Date for PT Re-Evaluation 09/14/16   Authorization Type [employer] Medicare   PT Start Time 1446   PT Stop Time 1527   PT Time Calculation (min) 41 min   Activity Tolerance Patient tolerated treatment well   Behavior During Therapy [facility] for tasks assessed/performed      Past Medical History:  Diagnosis Date  . BPH (benign prostatic hyperplasia)   . Foley catheter in place   . Nocturnal leg cramps   . Urinary retention   . Wears glasses     Past Surgical History:  Procedure Laterality Date  . CATARACT EXTRACTION W/ INTRAOCULAR LENS  IMPLANT, BILATERAL Bilateral   . INGUINAL HERNIA REPAIR Bilateral RIGHT 1984/  LEFT 2005  . RE-DO RIGHT INGUINAL HERNIA REPAIR AND UMBILICAL HERNIA REPAIR  2001  . REMOVAL LEFT EYE INTRAOCULAR LENS AND REPLACEMENT   08-29-2013  . TRANSURETHRAL RESECTION OF PROSTATE N/A 09/23/2013   Procedure: TRANSURETHRAL RESECTION OF THE PROSTATE (TURP) WITH GYRUS;  Surgeon: Claybon Jabs, MD;  Location: [place];  Service: Urology;  Laterality: N/A;    There were no vitals filed for this visit.      Subjective Assessment - 08/18/16 1450    Subjective Pt reports he continues to do HEP each evening   Patient Stated Goals Pt's goal for therapy is to improve balance and stair negotiation.   Currently in Pain? No/denies                         [facility] Adult PT Treatment/Exercise - 08/18/16 1449      Knee/Hip Exercises: Aerobic   Other Aerobic Scifit level 4.0 to 6.0 all 4 extremities x 8 minutes     Shoulder Exercises:  Stretch   Corner Stretch 3 reps;30 seconds   Wall Stretch - Flexion 3 reps;30 seconds           PWR [facility]) - 08/18/16 1451    PWR! exercises Moves in standing   PWR! Up x20   PWR! Rock x10 reps each   PWR! Twist x10 reps each side   PWR Step x10 reps each side          Balance Exercises - 08/18/16 1523      Balance Exercises: Standing   Stepping Strategy Anterior;Posterior;Lateral  Zoom Ball with stepping all directions x 10 each   Other Standing Exercises Posterior Lean with single large step and intermittent UE support with min A for post stepping strategy; post step weight shift alt x 10 bil with mod cues for technique           PT Education - 08/18/16 1532    Education provided Yes   Education Details reviewed and provided pt with handout of wall stretches   Person(s) Educated Patient   Methods Explanation;Demonstration;Handout   Comprehension Verbalized understanding;Returned demonstration          PT Short Term Goals - 08/17/16 1941      PT SHORT TERM GOAL #1   Title Pt will be independent with HEP for  improved balance, posture, functional mobility.  TARGET 08/14/16   Baseline pt reports he has been doing exercises through the holidays   Time 4   Period Weeks   Status Achieved     PT SHORT TERM GOAL #2   Title Pt will improve 5x sit<>stand to less than or equal to 12.5 seconds for improved transfer efficiency and safety.   Baseline 10.06 secs - 08-17-16   Time 4   Status Achieved     PT SHORT TERM GOAL #3   Title Pt will improve posterior step and release test to 2 steps or less with independent balance recovery, to demonstrate improved balance recovery in posterior direction.   Baseline approx. 3-4 steps taken for balance recovery - 08-17-16   Time 4   Period Weeks   Status Not Met     PT SHORT TERM GOAL #4   Title Pt will negotiate at least 4 steps with intermittent UE support, while carrying object in one hand; 5 of 5 trials no LOB, for improved  stair negotiation.   Baseline carried 2 kg ball up/down steps - 08-17-16   Time 4   Period Weeks   Status Achieved     PT SHORT TERM GOAL #5   Title Pt will verbalize understanding of local Parkinson's disease related resources.   Baseline Pt unsure of available resources - 08-17-16   Time 4   Status On-going           PT Long Term Goals - 07/15/16 1300      PT LONG TERM GOAL #1   Title Pt will verbalize understanding of fall prevention in home environment.  TARGET 09/14/16   Time 8   Period Weeks   Status New     PT LONG TERM GOAL #2   Title Pt will perform at least 8 of 10 reps of sit<>stand transfers, from less than 18 inch height, for improved transfer effciency and safety.   Time 4   Period Weeks   Status New     PT LONG TERM GOAL #3   Title Pt will improve Functional Gait Assessment score to at least 19/30 for decreased fall risk.   Time 8   Period Weeks   Status New     PT LONG TERM GOAL #4   Title Pt will verbalize plans for continued community fitness upon D/C from PT.   Time 8   Period Weeks   Status New     PT LONG TERM GOAL #5   Title Pt will report at least 50% improvement in performance of squatting to low surfaces and getting onto/up from floor.   Time 8   Period Weeks   Status New               Plan - 08/18/16 1534    Clinical Impression Statement Session focused on posterior stepping strategy with single large step to recover instead of multiple small steps; pt demonstrated improvement with repetition.  Continue to observe posterior LOB; would benefit from ongoing PT to address safety and falls prevention   PT Treatment/Interventions ADLs/Self Care Home Management;Functional mobility training;Stair training;Gait training;Therapeutic activities;Therapeutic exercise;Neuromuscular re-education;Balance training;Patient/family education   PT Next Visit Plan Continue posterior stepping strategy training and standing postural awareness   Consulted  and Agree with Plan of Care Patient      Patient will benefit from skilled therapeutic intervention in order to improve the following deficits and impairments:  Abnormal gait, Decreased balance, Decreased mobility, Decreased  strength, Difficulty walking, Impaired flexibility, Postural dysfunction  Visit Diagnosis: Other abnormalities of gait and mobility  Abnormal posture  Unsteadiness on feet     Problem List Patient Active Problem List   Diagnosis Date Noted  . BPH (benign prostatic hypertrophy) with urinary retention 09/23/2013    Laureen Abrahams, PT, DPT 08/18/16 3:42 PM  [facility] [address] [place], [place], [address] Phone: [phone]   Fax:  [phone]  Name: Joshua Browning MRN: [id number] Date of Birth: [birth year]

## 2016-08-18 NOTE — Patient Instructions (Signed)
Issued paper copy of wall stretches for shoulders

## 2016-08-23 ENCOUNTER — Ambulatory Visit: Payer: Medicare Other | Admitting: Physical Therapy

## 2016-08-23 DIAGNOSIS — R2689 Other abnormalities of gait and mobility: Secondary | ICD-10-CM

## 2016-08-23 DIAGNOSIS — R2681 Unsteadiness on feet: Secondary | ICD-10-CM

## 2016-08-23 NOTE — Therapy (Signed)
[facility] [address] [place], [place], [address] Phone: [phone]   Fax:  [phone]  Physical Therapy Treatment  Patient Details  Name: Joshua Browning MRN: [id number] Date of Birth: 11-14-1945 Referring Provider: Alonza Bogus, DO  Encounter Date: 08/23/2016      PT End of Session - 08/23/16 1425    Visit Number 9   Number of Visits 17   Date for PT Re-Evaluation 09/14/16   Authorization Type UHC Medicare   PT Start Time 1106   PT Stop Time 1150   PT Time Calculation (min) 44 min   Activity Tolerance Patient tolerated treatment well   Behavior During Therapy [facility] for tasks assessed/performed      Past Medical History:  Diagnosis Date  . BPH (benign prostatic hyperplasia)   . Foley catheter in place   . Nocturnal leg cramps   . Urinary retention   . Wears glasses     Past Surgical History:  Procedure Laterality Date  . CATARACT EXTRACTION W/ INTRAOCULAR LENS  IMPLANT, BILATERAL Bilateral   . INGUINAL HERNIA REPAIR Bilateral RIGHT 1984/  LEFT 2005  . RE-DO RIGHT INGUINAL HERNIA REPAIR AND UMBILICAL HERNIA REPAIR  2001  . REMOVAL LEFT EYE INTRAOCULAR LENS AND REPLACEMENT   08-29-2013  . TRANSURETHRAL RESECTION OF PROSTATE N/A 09/23/2013   Procedure: TRANSURETHRAL RESECTION OF THE PROSTATE (TURP) WITH GYRUS;  Surgeon: Claybon Jabs, MD;  Location: [place];  Service: Urology;  Laterality: N/A;    There were no vitals filed for this visit.      Subjective Assessment - 08/23/16 1113    Subjective Pt reports he notices more awareness in posture and walking activities.   Patient Stated Goals Pt's goal for therapy is to improve balance and stair negotiation.   Currently in Pain? No/denies            [facility] PT Assessment - 08/23/16 1126      Functional Gait  Assessment   Gait assessed  Yes                     [facility] Adult PT Treatment/Exercise - 08/23/16 1126      Transfers   Comments Practiced floor to stand transfers, low squats and half kneels x 10 reps, with minimal to no UE support, cues for widened BOS upon upright standing     High Level Balance   High Level Balance Activities Turns   High Level Balance Comments Turning practice with cues for correct foot placement and sequence, to avoid cross over steps     Therapeutic Activites    Therapeutic Activities Work Simulation   Work Chemical engineer work tasks, 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  Cues for wide BOS, for large step length, and correct turning sequence, multiple reps, with pt improving throughout sequence.             Balance Exercises - 08/23/16 1331      Balance Exercises: Standing   Stepping Strategy Posterior;UE support;10 reps  Solid surface, cues for deliberate step and recovery balance   Retro Gait Upper extremity support;5 reps  in parallel bars; postural cues/cues to incr. step length   Other Standing Exercises Anterior/posterior step and weigthshift without stopping in the middle,x 10 reps each leg with cues for large, deliberate weightshifting.  Balance perturbations in posterior direction, followed by posterior  push and release test.  Pt able to recover in posterior direction with 1-2 large steps.           PT Education - 08/23/16 1424    Education provided Yes   Education Details large movement patterns and PWR! exercises, weightshifting and stepping in functional household and work activities   Person(s) Educated Patient   Methods Explanation;Demonstration;Verbal cues   Comprehension Verbalized understanding;Returned demonstration;Need further instruction     Self Care:  Brief discussion of patient's goals and benefits of continued therapy to help patient carryover larger amplitude movements patterns into functional and work activities.     PT  Short Term Goals - 08/17/16 1941      PT SHORT TERM GOAL #1   Title Pt will be independent with HEP for improved balance, posture, functional mobility.  TARGET 08/14/16   Baseline pt reports he has been doing exercises through the holidays   Time 4   Period Weeks   Status Achieved     PT SHORT TERM GOAL #2   Title Pt will improve 5x sit<>stand to less than or equal to 12.5 seconds for improved transfer efficiency and safety.   Baseline 10.06 secs - 08-17-16   Time 4   Status Achieved     PT SHORT TERM GOAL #3   Title Pt will improve posterior step and release test to 2 steps or less with independent balance recovery, to demonstrate improved balance recovery in posterior direction.   Baseline approx. 3-4 steps taken for balance recovery - 08-17-16   Time 4   Period Weeks   Status Not Met     PT SHORT TERM GOAL #4   Title Pt will negotiate at least 4 steps with intermittent UE support, while carrying object in one hand; 5 of 5 trials no LOB, for improved stair negotiation.   Baseline carried 2 kg ball up/down steps - 08-17-16   Time 4   Period Weeks   Status Achieved     PT SHORT TERM GOAL #5   Title Pt will verbalize understanding of local Parkinson's disease related resources.   Baseline Pt unsure of available resources - 08-17-16   Time 4   Status On-going           PT Long Term Goals - 07/15/16 1300      PT LONG TERM GOAL #1   Title Pt will verbalize understanding of fall prevention in home environment.  TARGET 09/14/16   Time 8   Period Weeks   Status New     PT LONG TERM GOAL #2   Title Pt will perform at least 8 of 10 reps of sit<>stand transfers, from less than 18 inch height, for improved transfer effciency and safety.   Time 4   Period Weeks   Status New     PT LONG TERM GOAL #3   Title Pt will improve Functional Gait Assessment score to at least 19/30 for decreased fall risk.   Time 8   Period Weeks   Status New     PT LONG TERM GOAL #4   Title Pt will  verbalize plans for continued community fitness upon D/C from PT.   Time 8   Period Weeks   Status New     PT LONG TERM GOAL #5   Title Pt will report at least 50% improvement in performance of squatting to low surfaces and getting onto/up from floor.   Time 8   Period Weeks   Status  New               Plan - 08/23/16 1427    Clinical Impression Statement Focused today's session on posterior stepping strategy, retro gait, and simulation of work activities using large amplitude movement patterns, weightshifting and turning with verbal cues.  Pt demonstrates improved fluidity of movement patterns with activities during session.   Rehab Potential Good   PT Frequency 2x / week   PT Duration 8 weeks   PT Treatment/Interventions ADLs/Self Care Home Management;Functional mobility training;Stair training;Gait training;Therapeutic activities;Therapeutic exercise;Neuromuscular re-education;Balance training;Patient/family education   PT Next Visit Plan Continue posterior stepping strategy (add to HEP), retro gait, and functional/work based activities incorporating large movement patterns, turns, weightshifting  GCODE next visit   Consulted and Agree with Plan of Care Patient      Patient will benefit from skilled therapeutic intervention in order to improve the following deficits and impairments:  Abnormal gait, Decreased balance, Decreased mobility, Decreased strength, Difficulty walking, Impaired flexibility, Postural dysfunction  Visit Diagnosis: Other abnormalities of gait and mobility  Unsteadiness on feet     Problem List Patient Active Problem List   Diagnosis Date Noted  . BPH (benign prostatic hypertrophy) with urinary retention 09/23/2013    Jaxan Michel W. 08/23/2016, 2:35 PM  Frazier Butt., PT  [facility] [address] [place], [place], [address] Phone: [phone]   Fax:  [phone]  Name: SYAIR FRICKER MRN: [id number] Date of Birth: Mar 07, 1946

## 2016-08-25 ENCOUNTER — Ambulatory Visit: Payer: Medicare Other | Admitting: Physical Therapy

## 2016-08-25 DIAGNOSIS — R2681 Unsteadiness on feet: Secondary | ICD-10-CM

## 2016-08-25 DIAGNOSIS — R2689 Other abnormalities of gait and mobility: Secondary | ICD-10-CM | POA: Diagnosis not present

## 2016-08-25 DIAGNOSIS — R293 Abnormal posture: Secondary | ICD-10-CM

## 2016-08-25 NOTE — Therapy (Signed)
[facility] [address] [place], [place], [address] Phone: [phone]   Fax:  [phone]  Physical Therapy Treatment  Patient Details  Name: Joshua Browning MRN: [id number] Date of Birth: 11/29/1945 Referring Provider: Alonza Bogus, DO  Encounter Date: 08/25/2016      PT End of Session - 08/25/16 1537    Visit Number 10   Number of Visits 17   Date for PT Re-Evaluation 09/14/16   Authorization Type UHC Medicare   PT Start Time 2620   PT Stop Time 1528   PT Time Calculation (min) 41 min   Activity Tolerance Patient tolerated treatment well   Behavior During Therapy [facility] for tasks assessed/performed      Past Medical History:  Diagnosis Date  . BPH (benign prostatic hyperplasia)   . Foley catheter in place   . Nocturnal leg cramps   . Urinary retention   . Wears glasses     Past Surgical History:  Procedure Laterality Date  . CATARACT EXTRACTION W/ INTRAOCULAR LENS  IMPLANT, BILATERAL Bilateral   . INGUINAL HERNIA REPAIR Bilateral RIGHT 1984/  LEFT 2005  . RE-DO RIGHT INGUINAL HERNIA REPAIR AND UMBILICAL HERNIA REPAIR  2001  . REMOVAL LEFT EYE INTRAOCULAR LENS AND REPLACEMENT   08-29-2013  . TRANSURETHRAL RESECTION OF PROSTATE N/A 09/23/2013   Procedure: TRANSURETHRAL RESECTION OF THE PROSTATE (TURP) WITH GYRUS;  Surgeon: Claybon Jabs, MD;  Location: [place];  Service: Urology;  Laterality: N/A;    There were no vitals filed for this visit.      Subjective Assessment - 08/25/16 1448    Subjective had to leave work a little early to get here on time, but doing well.     Patient Stated Goals Pt's goal for therapy is to improve balance and stair negotiation.   Currently in Pain? No/denies            [facility] PT Assessment - 08/25/16 1449      Transfers   Five time sit to stand comments  12.56     Timed Up and Go Test   Normal TUG (seconds) 8.97   Manual TUG (seconds) 9.94     Functional Gait  Assessment   Gait assessed  Yes   Gait Level Surface Walks 20 ft in less than 5.5 sec, no assistive devices, good speed, no evidence for imbalance, normal gait pattern, deviates no more than 6 in outside of the 12 in walkway width.   Change in Gait Speed Able to smoothly change walking speed without loss of balance or gait deviation. Deviate no more than 6 in outside of the 12 in walkway width.   Gait with Horizontal Head Turns Performs head turns with moderate changes in gait velocity, slows down, deviates 10-15 in outside 12 in walkway width but recovers, can continue to walk.   Gait with Vertical Head Turns Performs task with slight change in gait velocity (eg, minor disruption to smooth gait path), deviates 6 - 10 in outside 12 in walkway width or uses assistive device   Gait and Pivot Turn Pivot turns safely within 3 sec and stops quickly with no loss of balance.   Step Over Obstacle Is able to step over one shoe box (4.5 in total height) without changing gait speed. No evidence of imbalance.   Gait with Narrow Base of Support Is able to ambulate for 10 steps heel to toe with no staggering.   Gait with Eyes Closed  Walks 20 ft, uses assistive device, slower speed, mild gait deviations, deviates 6-10 in outside 12 in walkway width. Ambulates 20 ft in less than 9 sec but greater than 7 sec.  9 sec   Ambulating Backwards Walks 20 ft, uses assistive device, slower speed, mild gait deviations, deviates 6-10 in outside 12 in walkway width.   Steps Alternating feet, no rail.   Total Score 24                     [facility] Adult PT Treatment/Exercise - 08/25/16 1449      High Level Balance   High Level Balance Activities Turns   High Level Balance Comments Turning practice with cues for correct foot placement and sequence, to avoid cross over steps; performed 90 degree turns carrying 15# to simulate work activities.  Utilized opposite foot step back followed by step turn with  other foot both directions     Knee/Hip Exercises: Aerobic   Other Aerobic SciFit L3.5 x 5 min with RPM goal > 50                  PT Short Term Goals - 08/17/16 1941      PT SHORT TERM GOAL #1   Title Pt will be independent with HEP for improved balance, posture, functional mobility.  TARGET 08/14/16   Baseline pt reports he has been doing exercises through the holidays   Time 4   Period Weeks   Status Achieved     PT SHORT TERM GOAL #2   Title Pt will improve 5x sit<>stand to less than or equal to 12.5 seconds for improved transfer efficiency and safety.   Baseline 10.06 secs - 08-17-16   Time 4   Status Achieved     PT SHORT TERM GOAL #3   Title Pt will improve posterior step and release test to 2 steps or less with independent balance recovery, to demonstrate improved balance recovery in posterior direction.   Baseline approx. 3-4 steps taken for balance recovery - 08-17-16   Time 4   Period Weeks   Status Not Met     PT SHORT TERM GOAL #4   Title Pt will negotiate at least 4 steps with intermittent UE support, while carrying object in one hand; 5 of 5 trials no LOB, for improved stair negotiation.   Baseline carried 2 kg ball up/down steps - 08-17-16   Time 4   Period Weeks   Status Achieved     PT SHORT TERM GOAL #5   Title Pt will verbalize understanding of local Parkinson's disease related resources.   Baseline Pt unsure of available resources - 08-17-16   Time 4   Status On-going           PT Long Term Goals - 08/25/16 1537      PT LONG TERM GOAL #1   Title Pt will verbalize understanding of fall prevention in home environment.  TARGET 09/14/16   Status On-going     PT LONG TERM GOAL #2   Title Pt will perform at least 8 of 10 reps of sit<>stand transfers, from less than 18 inch height, for improved transfer effciency and safety.   Status On-going     PT LONG TERM GOAL #3   Title Pt will improve Functional Gait Assessment score to at least 19/30 for  decreased fall risk.   Status Achieved     PT LONG TERM GOAL #4   Title Pt will  verbalize plans for continued community fitness upon D/C from PT.   Status On-going     PT LONG TERM GOAL #5   Title Pt will report at least 50% improvement in performance of squatting to low surfaces and getting onto/up from floor.   Status On-going               Plan - 07-Sep-2016 1537    Clinical Impression Statement Pt demonstrated significant improvement in FGA scoring 24/30 and improvements in timed up and go normal and cognitive as well.  Pt continues to have concerns about turning while at work holding up to 20# so rest of session focused on functional work tasks.  Will continue to benefit from PT to maximize function.  Anticipate potential d/c next couple weeks.   PT Treatment/Interventions ADLs/Self Care Home Management;Functional mobility training;Stair training;Gait training;Therapeutic activities;Therapeutic exercise;Neuromuscular re-education;Balance training;Patient/family education   PT Next Visit Plan Continue posterior stepping strategy (add to HEP), retro gait, and functional/work based activities incorporating large movement patterns, turns, weightshifting   Consulted and Agree with Plan of Care Patient      Patient will benefit from skilled therapeutic intervention in order to improve the following deficits and impairments:  Abnormal gait, Decreased balance, Decreased mobility, Decreased strength, Difficulty walking, Impaired flexibility, Postural dysfunction  Visit Diagnosis: Other abnormalities of gait and mobility  Unsteadiness on feet  Abnormal posture       G-Codes - 09-07-16 1544    Functional Assessment Tool Used 5x sit<>stand 12.56 sec, TUG 8.97 sec, TUG cog 9.94 sec, Functional Gait Assessment 24/30   Functional Limitation Mobility: Walking and moving around   Mobility: Walking and Moving Around Current Status [id number]) At least 1 percent but less than 20 percent  impaired, limited or restricted   Mobility: Walking and Moving Around Goal Status [phone]) At least 1 percent but less than 20 percent impaired, limited or restricted      Problem List Patient Active Problem List   Diagnosis Date Noted  . BPH (benign prostatic hypertrophy) with urinary retention 09/23/2013     Laureen Abrahams, PT, DPT 2016-09-07 3:51 PM    [facility] [address] [place], [place], [address] Phone: [phone]   Fax:  [phone]  Name: Joshua Browning MRN: [id number] Date of Birth: Jan 04, 1946     Physical Therapy Progress Note  Dates of Reporting Period: 07/15/16 to Sep 07, 2016  Objective Reports of Subjective Statement: see above  Objective Measurements: see above  Goal Update: see above  Plan: see above  Reason Skilled Services are Required: see above    Laureen Abrahams, PT, DPT 09-07-2016 3:52 PM  [facility] [address] [place], [place] [address]  [phone] (office) [phone] (fax)

## 2016-08-30 ENCOUNTER — Ambulatory Visit: Payer: Medicare Other | Admitting: Physical Therapy

## 2016-08-30 DIAGNOSIS — R2689 Other abnormalities of gait and mobility: Secondary | ICD-10-CM | POA: Diagnosis not present

## 2016-08-30 DIAGNOSIS — R2681 Unsteadiness on feet: Secondary | ICD-10-CM

## 2016-08-30 DIAGNOSIS — R293 Abnormal posture: Secondary | ICD-10-CM

## 2016-08-30 NOTE — Patient Instructions (Signed)

## 2016-08-30 NOTE — Therapy (Signed)
[facility] [address] [place], [place], [address] Phone: [phone]   Fax:  [phone]  Physical Therapy Treatment  Patient Details  Name: Joshua Browning MRN: [id number] Date of Birth: 08/28/1945 Referring Provider: Alonza Bogus, DO  Encounter Date: 08/30/2016      PT End of Session - 08/30/16 1159    Visit Number 11   Number of Visits 17   Date for PT Re-Evaluation 09/14/16   Authorization Type [employer] Medicare   PT Start Time 1105   PT Stop Time 1147   PT Time Calculation (min) 42 min   Activity Tolerance Patient tolerated treatment well   Behavior During Therapy [facility] for tasks assessed/performed      Past Medical History:  Diagnosis Date  . BPH (benign prostatic hyperplasia)   . Foley catheter in place   . Nocturnal leg cramps   . Urinary retention   . Wears glasses     Past Surgical History:  Procedure Laterality Date  . CATARACT EXTRACTION W/ INTRAOCULAR LENS  IMPLANT, BILATERAL Bilateral   . INGUINAL HERNIA REPAIR Bilateral RIGHT 1984/  LEFT 2005  . RE-DO RIGHT INGUINAL HERNIA REPAIR AND UMBILICAL HERNIA REPAIR  2001  . REMOVAL LEFT EYE INTRAOCULAR LENS AND REPLACEMENT   08-29-2013  . TRANSURETHRAL RESECTION OF PROSTATE N/A 09/23/2013   Procedure: TRANSURETHRAL RESECTION OF THE PROSTATE (TURP) WITH GYRUS;  Surgeon: Claybon Jabs, MD;  Location: [facility] [facility];  Service: Urology;  Laterality: N/A;    There were no vitals filed for this visit.      Subjective Assessment - 08/30/16 1156    Subjective Denies falls or changes since last visit.  Has been working on exercises but not able to get walking in due to weather.   Patient Stated Goals Pt's goal for therapy is to improve balance and stair negotiation.   Currently in Pain? No/denies       Treatment session focused on forward, backward, side and forward<>backward step weight shifting/stepping along with varied direction stepping  using random order reading from wall chart.  Pt initially having LOB when WB on LLE, especially with forward step.  Worked on increasing BOS and upright posture through out.    Self-care:Discussed optimal community fitness and POP and provided handouts for pt      PT Education - 08/30/16 1157    Education provided Yes   Education Details Optimal community fitness, POP   Person(s) Educated Patient   Methods Explanation;Verbal cues;Handout   Comprehension Verbalized understanding          PT Short Term Goals - 08/17/16 1941      PT SHORT TERM GOAL #1   Title Pt will be independent with HEP for improved balance, posture, functional mobility.  TARGET 08/14/16   Baseline pt reports he has been doing exercises through the holidays   Time 4   Period Weeks   Status Achieved     PT SHORT TERM GOAL #2   Title Pt will improve 5x sit<>stand to less than or equal to 12.5 seconds for improved transfer efficiency and safety.   Baseline 10.06 secs - 08-17-16   Time 4   Status Achieved     PT SHORT TERM GOAL #3   Title Pt will improve posterior step and release test to 2 steps or less with independent balance recovery, to demonstrate improved balance recovery in posterior direction.   Baseline approx. 3-4 steps taken for balance recovery - 08-17-16  Time 4   Period Weeks   Status Not Met     PT SHORT TERM GOAL #4   Title Pt will negotiate at least 4 steps with intermittent UE support, while carrying object in one hand; 5 of 5 trials no LOB, for improved stair negotiation.   Baseline carried 2 kg ball up/down steps - 08-17-16   Time 4   Period Weeks   Status Achieved     PT SHORT TERM GOAL #5   Title Pt will verbalize understanding of local Parkinson's disease related resources.   Baseline Pt unsure of available resources - 08-17-16   Time 4   Status On-going           PT Long Term Goals - 08/25/16 1537      PT LONG TERM GOAL #1   Title Pt will verbalize understanding of fall  prevention in home environment.  TARGET 09/14/16   Status On-going     PT LONG TERM GOAL #2   Title Pt will perform at least 8 of 10 reps of sit<>stand transfers, from less than 18 inch height, for improved transfer effciency and safety.   Status On-going     PT LONG TERM GOAL #3   Title Pt will improve Functional Gait Assessment score to at least 19/30 for decreased fall risk.   Status Achieved     PT LONG TERM GOAL #4   Title Pt will verbalize plans for continued community fitness upon D/C from PT.   Status On-going     PT LONG TERM GOAL #5   Title Pt will report at least 50% improvement in performance of squatting to low surfaces and getting onto/up from floor.   Status On-going               Plan - 08/30/16 1159    Clinical Impression Statement Pt initially having difficulty with forward step weight shifting stepping with RLE and weight bearing on L but improved with practice and cues.  Pt reports improved balance at work while turning and carrying objects.  Continue PT per POC.   Rehab Potential Good   PT Frequency 2x / week   PT Duration 8 weeks   PT Treatment/Interventions ADLs/Self Care Home Management;Functional mobility training;Stair training;Gait training;Therapeutic activities;Therapeutic exercise;Neuromuscular re-education;Balance training;Patient/family education   PT Next Visit Plan Continue working on varied direction stepping with cognitive component and try on compliant surface;posterior stepping strategy (add to HEP), retro gait, and functional/work based activities incorporating large movement patterns, turns   Consulted and Agree with Plan of Care Patient      Patient will benefit from skilled therapeutic intervention in order to improve the following deficits and impairments:  Abnormal gait, Decreased balance, Decreased mobility, Decreased strength, Difficulty walking, Impaired flexibility, Postural dysfunction  Visit Diagnosis: Other abnormalities of  gait and mobility  Unsteadiness on feet  Abnormal posture     Problem List Patient Active Problem List   Diagnosis Date Noted  . BPH (benign prostatic hypertrophy) with urinary retention 09/23/2013    Narda Bonds 08/30/2016, 12:13 PM  [facility] [address] [place], [place], [address] Phone: [phone]   Fax:  [phone]  Name: MEHTAB DOLBERRY MRN: [id number] Date of Birth: [birth year]  [place], [place] 08/30/16 12:13 PM Phone: [phone] Fax: [phone]

## 2016-09-01 ENCOUNTER — Ambulatory Visit: Payer: Medicare Other | Admitting: Physical Therapy

## 2016-09-06 ENCOUNTER — Ambulatory Visit: Payer: Medicare Other | Admitting: Physical Therapy

## 2016-09-06 DIAGNOSIS — R2689 Other abnormalities of gait and mobility: Secondary | ICD-10-CM | POA: Diagnosis not present

## 2016-09-06 DIAGNOSIS — R2681 Unsteadiness on feet: Secondary | ICD-10-CM

## 2016-09-06 DIAGNOSIS — R293 Abnormal posture: Secondary | ICD-10-CM

## 2016-09-06 NOTE — Patient Instructions (Addendum)
Fall Prevention in the Home Falls can cause injuries and can affect people from all age groups. There are many simple things that you can do to make your home safe and to help prevent falls. What can I do on the outside of my home?  Regularly repair the edges of walkways and driveways and fix any cracks.  Remove high doorway thresholds.  Trim any shrubbery on the main path into your home.  Use bright outdoor lighting.  Clear walkways of debris and clutter, including tools and rocks.  Regularly check that handrails are securely fastened and in good repair. Both sides of any steps should have handrails.  Install guardrails along the edges of any raised decks or porches.  Have leaves, snow, and ice cleared regularly.  Use sand or salt on walkways during winter months.  In the garage, clean up any spills right away, including grease or oil spills. What can I do in the bathroom?  Use night lights.  Install grab bars by the toilet and in the tub and shower. Do not use towel bars as grab bars.  Use non-skid mats or decals on the floor of the tub or shower.  If you need to sit down while you are in the shower, use a plastic, non-slip stool.  Keep the floor dry. Immediately clean up any water that spills on the floor.  Remove soap buildup in the tub or shower on a regular basis.  Attach bath mats securely with double-sided non-slip rug tape.  Remove throw rugs and other tripping hazards from the floor. What can I do in the bedroom?  Use night lights.  Make sure that a bedside light is easy to reach.  Do not use oversized bedding that drapes onto the floor.  Have a firm chair that has side arms to use for getting dressed.  Remove throw rugs and other tripping hazards from the floor. What can I do in the kitchen?  Clean up any spills right away.  Avoid walking on wet floors.  Place frequently used items in easy-to-reach places.  If you need to reach for something above  you, use a sturdy step stool that has a grab bar.  Keep electrical cables out of the way.  Do not use floor polish or wax that makes floors slippery. If you have to use wax, make sure that it is non-skid floor wax.  Remove throw rugs and other tripping hazards from the floor. What can I do in the stairways?  Do not leave any items on the stairs.  Make sure that there are handrails on both sides of the stairs. Fix handrails that are broken or loose. Make sure that handrails are as long as the stairways.  Check any carpeting to make sure that it is firmly attached to the stairs. Fix any carpet that is loose or worn.  Avoid having throw rugs at the top or bottom of stairways, or secure the rugs with carpet tape to prevent them from moving.  Make sure that you have a light switch at the top of the stairs and the bottom of the stairs. If you do not have them, have them installed. What are some other fall prevention tips?  Wear closed-toe shoes that fit well and support your feet. Wear shoes that have rubber soles or low heels.  When you use a stepladder, make sure that it is completely opened and that the sides are firmly locked. Have someone hold the ladder while you are using   it. Do not climb a closed stepladder.  Add color or contrast paint or tape to grab bars and handrails in your home. Place contrasting color strips on the first and last steps.  Use mobility aids as needed, such as canes, walkers, scooters, and crutches.  Turn on lights if it is dark. Replace any light bulbs that burn out.  Set up furniture so that there are clear paths. Keep the furniture in the same spot.  Fix any uneven floor surfaces.  Choose a carpet design that does not hide the edge of steps of a stairway.  Be aware of any and all pets.  Review your medicines with your healthcare provider. Some medicines can cause dizziness or changes in blood pressure, which increase your risk of falling. Talk with  your health care provider about other ways that you can decrease your risk of falls. This may include working with a physical therapist or trainer to improve your strength, balance, and endurance. This information is not intended to replace advice given to you by your health care provider. Make sure you discuss any questions you have with your health care provider. Document Released: 07/22/2002 Document Revised: 12/29/2015 Document Reviewed: 09/05/2014 Elsevier Interactive Patient Education  [employer].     It is important to avoid accidents which may result in broken bones.  Here are a few ideas on how to make your home safer so you will be less likely to trip or fall.  1. Use nonskid mats or non slip strips in your shower or tub, on your bathroom floor and around sinks.  If you know that you have spilled water, wipe it up! 2. In the bathroom, it is important to have properly installed grab bars on the walls or on the edge of the tub.  Towel racks are NOT strong enough for you to hold onto or to pull on for support. 3. Stairs and hallways should have enough light.  Add lamps or night lights if you need ore light. 4. It is good to have handrails on both sides of the stairs if possible.  Always fix broken handrails right away. 5. It is important to see the edges of steps.  Paint the edges of outdoor steps white so you can see them better.  Put colored tape on the edge of inside steps. 6. Throw-rugs are dangerous because they can slide.  Removing the rugs is the best idea, but if they must stay, add adhesive carpet tape to prevent slipping. 7. Do not keep things on stairs or in the halls.  Remove small furniture that blocks the halls as it may cause you to trip.  Keep telephone and electrical cords out of the way where you walk. 8. Always were sturdy, rubber-soled shoes for good support.  Never wear just socks, especially on the stairs.  Socks may cause you to slip or fall.  Do not wear  full-length housecoats as you can easily trip on the bottom.  9. Place the things you use the most on the shelves that are the easiest to reach.  If you use a stepstool, make sure it is in good condition.  If you feel unsteady, DO NOT climb, ask for help. 10. If a health professional advises you to use a cane or walker, do not be ashamed.  These items can keep you from falling and breaking your bones.    Gastroc / Heel Cord Stretch - On Step    Stand with heels over edge  of stair. Holding rail, lower heels until stretch is felt in calf of legs.  Hold for 30 seconds. Repeat 2 times. Do 2 times per day.  Can do both legs at same time or one at a time keeping other leg fully on step.  Copyright  VHI. All rights reserved.

## 2016-09-06 NOTE — Therapy (Signed)
[facility] [address] [place], [place], [address] Phone: [phone]   Fax:  [phone]  Physical Therapy Treatment  Patient Details  Name: Joshua Browning MRN: [id number] Date of Birth: 27-Jan-1946 Referring Provider: Alonza Bogus, DO  Encounter Date: 09/06/2016      PT End of Session - 09/06/16 1211    Visit Number 12   Number of Visits 17   Date for PT Re-Evaluation 09/14/16   Authorization Type [employer] Medicare   PT Start Time 1104   PT Stop Time 1147   PT Time Calculation (min) 43 min   Activity Tolerance Patient tolerated treatment well   Behavior During Therapy [facility] for tasks assessed/performed      Past Medical History:  Diagnosis Date  . BPH (benign prostatic hyperplasia)   . Foley catheter in place   . Nocturnal leg cramps   . Urinary retention   . Wears glasses     Past Surgical History:  Procedure Laterality Date  . CATARACT EXTRACTION W/ INTRAOCULAR LENS  IMPLANT, BILATERAL Bilateral   . INGUINAL HERNIA REPAIR Bilateral RIGHT 1984/  LEFT 2005  . RE-DO RIGHT INGUINAL HERNIA REPAIR AND UMBILICAL HERNIA REPAIR  2001  . REMOVAL LEFT EYE INTRAOCULAR LENS AND REPLACEMENT   08-29-2013  . TRANSURETHRAL RESECTION OF PROSTATE N/A 09/23/2013   Procedure: TRANSURETHRAL RESECTION OF THE PROSTATE (TURP) WITH GYRUS;  Surgeon: Claybon Jabs, MD;  Location: [facility];  Service: Urology;  Laterality: N/A;    There were no vitals filed for this visit.      Subjective Assessment - 09/06/16 1107    Subjective Pt reports walking yesterday.  Other than that has not been walking but has been doing PWR! exercises and recumbent bike.   Patient Stated Goals Pt's goal for therapy is to improve balance and stair negotiation.   Currently in Pain? No/denies             [facility] Adult PT Treatment/Exercise - 09/06/16 0001      Transfers   Transfers Sit to Stand;Stand to Sit   Sit to Stand 7:  Independent   Five time sit to stand comments  12.38   Stand to Sit 7: Independent     Ambulation/Gait   Gait velocity 7.85 sec=4.18 ft/sec     Posture/Postural Control   Posture/Postural Control Postural limitations   Postural Limitations Forward head;Rounded Shoulders;Increased thoracic kyphosis     Standardized Balance Assessment   Standardized Balance Assessment Timed Up and Go Test     Timed Up and Go Test   TUG Normal TUG;Manual TUG;Cognitive TUG   Normal TUG (seconds) 7.94   Manual TUG (seconds) 8.75   Cognitive TUG (seconds) 10.28     Self-Care   Self-Care Other Self-Care Comments   Other Self-Care Comments  Educated on fall prevention strategies.  Verbally reviewed HEP of Seated and Standing PWR! moves, seated hamstring stretch, wall and corner stretch for posture, stepping onto/off of step and sit<>stand.  Revised heel cord stretch off edge of step (instead of on block as previously instructed) as pt able to get increased stretch off step.  Educated on purpose of PT,OT,ST screen in 6 months.  Educated on progress toward goals and goals met.  Reviewed optimal community fitness and provided handout on PD related exercise/community and webbased resources.                PT Education - 09/06/16 1209    Education provided Yes  Education Details HEP, optimal community fitness, community and web based resources including exercise programs, fall prevention strategies, screen in 6 months, progress toward goals/goals met,.   Person(s) Educated Patient   Methods Explanation;Demonstration;Verbal cues;Handout   Comprehension Verbalized understanding;Returned demonstration          PT Short Term Goals - 08/17/16 1941      PT SHORT TERM GOAL #1   Title Pt will be independent with HEP for improved balance, posture, functional mobility.  TARGET 08/14/16   Baseline pt reports he has been doing exercises through the holidays   Time 4   Period Weeks   Status Achieved      PT SHORT TERM GOAL #2   Title Pt will improve 5x sit<>stand to less than or equal to 12.5 seconds for improved transfer efficiency and safety.   Baseline 10.06 secs - 08-17-16   Time 4   Status Achieved     PT SHORT TERM GOAL #3   Title Pt will improve posterior step and release test to 2 steps or less with independent balance recovery, to demonstrate improved balance recovery in posterior direction.   Baseline approx. 3-4 steps taken for balance recovery - 08-17-16   Time 4   Period Weeks   Status Not Met     PT SHORT TERM GOAL #4   Title Pt will negotiate at least 4 steps with intermittent UE support, while carrying object in one hand; 5 of 5 trials no LOB, for improved stair negotiation.   Baseline carried 2 kg ball up/down steps - 08-17-16   Time 4   Period Weeks   Status Achieved     PT SHORT TERM GOAL #5   Title Pt will verbalize understanding of local Parkinson's disease related resources.   Baseline Pt unsure of available resources - 08-17-16   Time 4   Status On-going           PT Long Term Goals - 09/06/16 1210      PT LONG TERM GOAL #1   Title Pt will verbalize understanding of fall prevention in home environment.  TARGET 09/14/16   Status Achieved     PT LONG TERM GOAL #2   Title Pt will perform at least 8 of 10 reps of sit<>stand transfers, from less than 18 inch height, for improved transfer effciency and safety.   Status Achieved     PT LONG TERM GOAL #3   Title Pt will improve Functional Gait Assessment score to at least 19/30 for decreased fall risk.   Status Achieved     PT LONG TERM GOAL #4   Title Pt will verbalize plans for continued community fitness upon D/C from PT.   Status Achieved     PT LONG TERM GOAL #5   Title Pt will report at least 50% improvement in performance of squatting to low surfaces and getting onto/up from floor.   Baseline per pt report   Status Achieved               Plan - 09/06/16 1211    Clinical Impression Statement  Pt has met all LTG's.  Education complete and pt states no further questions/concerns at this time.  Feels like he has seen significant improvement in his functional mobility especially related to work.  Discharge from PT and screen in 6 months for all 3 disciplines.     PT Next Visit Plan Discharge from PT and screen in 6 months.   Consulted and Agree  with Plan of Care Patient      Patient will benefit from skilled therapeutic intervention in order to improve the following deficits and impairments:     Visit Diagnosis: Other abnormalities of gait and mobility  Unsteadiness on feet  Abnormal posture       G-Codes - Sep 25, 2016 1214    Functional Assessment Tool Used 5x sit<>stand 12.38 sec, TUG 7.94 sec, TUG cog 10.28 sec, TUG man 8.75 sec, Functional Gait Assessment 24/30      Problem List Patient Active Problem List   Diagnosis Date Noted  . BPH (benign prostatic hypertrophy) with urinary retention 09/23/2013     [facility] [address] [place], [place], [address] Phone: [phone]   Fax:  [phone]  Name: Joshua Browning MRN: [id number] Date of Birth: August 02, 1946  Einar Grad Dassel 25-Sep-2016 12:16 PM Phone: [phone] Fax: [phone]  Addendum: G Codes Functional Assessment Tool: see above Functional Limitation: Mobility: Walking and Moving Around  Goal Status  [id number]): CI Discharge Status:  CI  PHYSICAL THERAPY DISCHARGE SUMMARY  Visits from Start of Care: 12  Current functional level related to goals / functional outcomes: See LTGs   Remaining deficits: Posture, balance, improving   Education / Equipment: Educated in [employer], fall prevention, community fitness  Plan: Patient agrees to discharge.  Patient goals were met. Patient is being discharged due to meeting the stated rehab goals.  ?????   REcommend PT screen in 6 months.   Mady Haagensen, PT 09/07/16 8:01 AM Phone: [phone] Fax: [phone]

## 2016-09-30 NOTE — Progress Notes (Signed)
Joshua Browning was seen today in the movement disorders clinic for neurologic consultation at the request of Melinda Crutch, MD.  The consultation is for the evaluation of tremor and to r/o PD.  This patient is accompanied in the office by his spouse who supplements the history.  Tremor began about 5 years ago and involves both hands.  Tremor is most evident when he is picking up a fork or pencil or trying to hold a book.  He is R hand dominant but both hands seem to shake equally.  He thinks that it has changed/increased a little over the years.  05/30/16 update:  Pt f/u today, accompanied by his wife who supplements the history.  He had an MRI of the brain since last visit that I did review. This was done on 12/08/15.  There was at least moderate small vessel disease.  No falls since our last visit.  His balance has been worse.  He feels that he wants to fall backwards, especially on the stairs.  Notes some tremor, worse with anxiety.  Both at rest and with holding something.  Was supposed to have neuropsych testing since last visit but when called to schedule it, he declined it.  States that he was confused as to why we wanted to schedule it.    10/04/16 update:  Pt f/u today, accompanied by his wife who supplements the history.  He had neuropsych testing with Dr. Si Raider on 07/19/2016.  He has already followed up with her for the results.  Results were largely normal, with some evidence of mild cognitive impairment.  There was not evidence of a neurodegenerative dementia.  Dr. Si Raider stated that cognitive behavioral therapy could be of value.  He has been attending PT lately and I have reviewed those records.   Pt states that it really helped and he learned a lot of techniques for getting up out of cars, etc easily.  He has completed PT now.  He has had no falls.  He has had a few times where he was trying to get up off the couch and he would fall back into it.  His tremor is worse in the R hand than the L.   Not spilling anything.  Morning is worse for tremor than afternoon  ALLERGIES:  No Known Allergies  CURRENT MEDICATIONS:  Outpatient Encounter Prescriptions as of 10/04/2016  Medication Sig  . Cholecalciferol (VITAMIN D3) 1000 UNITS CAPS Take 1 capsule by mouth every evening.  . Glucosamine Sulfate 1000 MG CAPS Take by mouth 2 (two) times daily.  . Magnesium 100 MG CAPS Take 1 capsule by mouth every evening.  . Multiple Vitamins-Minerals (VITABASIC SENIOR PO) Take 1 tablet by mouth.  . ranitidine (ZANTAC) 150 MG tablet Take 150 mg by mouth 2 (two) times daily.  . vitamin B-12 (CYANOCOBALAMIN) 1000 MCG tablet Take 1,000 mcg by mouth every evening.   No facility-administered encounter medications on file as of 10/04/2016.     PAST MEDICAL HISTORY:   Past Medical History:  Diagnosis Date  . BPH (benign prostatic hyperplasia)   . Foley catheter in place   . Nocturnal leg cramps   . Urinary retention   . Wears glasses     PAST SURGICAL HISTORY:   Past Surgical History:  Procedure Laterality Date  . CATARACT EXTRACTION W/ INTRAOCULAR LENS  IMPLANT, BILATERAL Bilateral   . INGUINAL HERNIA REPAIR Bilateral RIGHT 1984/  LEFT 2005  . RE-DO RIGHT INGUINAL HERNIA REPAIR AND UMBILICAL  HERNIA REPAIR  2001  . REMOVAL LEFT EYE INTRAOCULAR LENS AND REPLACEMENT   08-29-2013  . TRANSURETHRAL RESECTION OF PROSTATE N/A 09/23/2013   Procedure: TRANSURETHRAL RESECTION OF THE PROSTATE (TURP) WITH GYRUS;  Surgeon: Claybon Jabs, MD;  Location: [facility];  Service: Urology;  Laterality: N/A;    SOCIAL HISTORY:   Social History   Social History  . Marital status: Married    Spouse name: N/A  . Number of children: N/A  . Years of education: N/A   Occupational History  . 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 Smoking status: Never Smoker  . Smokeless tobacco: Never Used  . Alcohol use No  . Drug use: No  . Sexual  activity: Not on file   Other Topics Concern  . Not on file   Social History Narrative  . No narrative on file    FAMILY HISTORY:   Family Status  Relation Status  . Mother Deceased   CHF  . Father Deceased   DM, heart attack  . Son Alive   healthy  . Son Alive   healthy  . Son Alive   healthy    ROS:  A complete 10 system review of systems was obtained and was unremarkable apart from what is mentioned above.  PHYSICAL EXAMINATION:    VITALS:   Vitals:   10/04/16 0747  BP: 128/80  Pulse: 70  Weight: 180 lb (81.6 kg)  Height: 5\' 10"  (1.778 m)    GEN:  The patient appears stated age and is in NAD. HEENT:  Normocephalic, atraumatic.  The mucous membranes are moist. The superficial temporal arteries are without ropiness or tenderness. CV:  RRR Lungs:  CTAB Neck/HEME:  There are no carotid bruits bilaterally.  Neurological examination:  Orientation: The patient is alert and oriented x3. Fund of knowledge is appropriate.  Recent and remote memory are intact.  Attention and concentration are normal.    Able to name objects and repeat phrases.He does look to his wife for the finer details of the history. Cranial nerves: There is good facial symmetry. There is mild facial hypomimia.  Pupil on the L is slightly irregular from prior surgery.  . Fundoscopic exam reveals clear margins bilaterally. Extraocular muscles are intact. The visual fields are full to confrontational testing. The speech is fluent and clear.  He does have some occasional word finding trouble.  He is mildly hypophonic.   Soft palate rises symmetrically and there is no tongue deviation. Hearing is intact to conversational tone. Sensation: Sensation is intact to light and pinprick throughout (facial, trunk, extremities). Vibration is decreased at the bilateral big toe. There is no extinction with double simultaneous stimulation. There is no sensory dermatomal level identified. Motor: Strength is 5/5 in the  bilateral upper and lower extremities.   Shoulder shrug is equal and symmetric.  There is no pronator drift. Deep tendon reflexes: Deep tendon reflexes are 2+/4 at the bilateral biceps, triceps, brachioradialis, 3- at the bilateral patella and 2/4 at the bilateral achilles. Plantar responses are downgoing bilaterally.  Movement examination: Tone: There is normal tone in the bilateral upper extremities.  The tone in the lower extremities is normal.  Abnormal movements: There is a mild resting tremor of the RUE but mostly with ambulation and minimally with distraction.  He has minimal tremor of the outstretched hands that does increase with intention.  Doesn't  spill water when asked to pour it from one glass to another.  Has mild head tremor in "yes" direction.  His tremor does get worse when given a weight.   Coordination:  There is no decremation with RAM's, with any form of RAMS, including alternating supination and pronation of the forearm, hand opening and closing, finger taps, heel taps and toe taps. Gait and Station: The patient has minimal difficulty arising out of a deep-seated chair without the use of the hands. The patient's stride length is normal but he has stooped posture and has increase in RUE resting tremor with ambulation.  Labs:   Lab Results  Component Value Date   TSH 1.11 11/30/2015   Lab Results  Component Value Date   [id number] 11/30/2015   Lab Results  Component Value Date   FOLATE 18.8 11/30/2015     ASSESSMENT/PLAN:  1.  Tremor  -I have been watching him for development of parkinsonism but it really hasn't progressed much over the years.  I will continue to monitor.  Doesn't want med for tremor 2.  Gait instability  -I think that this is multifactorial.  He has some evidence of parkinsonism but I think that, if anything, this is vascular (less worried for idiopathic than last visit).  He also has evidence of peripheral neuropathy on examination.  He found PT  to be very helpful.  -encouraged exercise safely  3.  Cerebral small vessel disease  -if able and no hx of GI bleed, recommend ASA, 81 mg EC daily 4.  Memory change  -He had neuropsych testing with Dr. Si Raider on 07/19/2016.  Results were largely normal, with some evidence of mild cognitive impairment.  5.  I will plan on seeing him in 6-8 months, sooner should new neurologic issues arise.  Much greater than 50% of this 25 minute visit was spent in counseling and coordinating care.

## 2016-10-04 ENCOUNTER — Encounter: Payer: Self-pay | Admitting: Neurology

## 2016-10-04 ENCOUNTER — Ambulatory Visit (INDEPENDENT_AMBULATORY_CARE_PROVIDER_SITE_OTHER): Payer: Medicare Other | Admitting: Neurology

## 2016-10-04 VITALS — BP 128/80 | HR 70 | Ht 70.0 in | Wt 180.0 lb

## 2016-10-04 DIAGNOSIS — G250 Essential tremor: Secondary | ICD-10-CM | POA: Diagnosis not present

## 2016-10-04 DIAGNOSIS — G3184 Mild cognitive impairment, so stated: Secondary | ICD-10-CM

## 2017-03-07 ENCOUNTER — Ambulatory Visit: Payer: Medicare Other | Admitting: Occupational Therapy

## 2017-03-07 ENCOUNTER — Ambulatory Visit: Payer: Medicare Other | Attending: Neurology | Admitting: Physical Therapy

## 2017-03-07 ENCOUNTER — Ambulatory Visit: Payer: Medicare Other | Admitting: Speech Pathology

## 2017-03-07 DIAGNOSIS — R278 Other lack of coordination: Secondary | ICD-10-CM | POA: Insufficient documentation

## 2017-03-07 DIAGNOSIS — G20 Parkinson's disease: Secondary | ICD-10-CM

## 2017-03-07 DIAGNOSIS — R2689 Other abnormalities of gait and mobility: Secondary | ICD-10-CM | POA: Insufficient documentation

## 2017-03-07 NOTE — Therapy (Signed)
[facility] [address] [place], [place], [address] Phone: [phone]   Fax:  [phone]  Patient Details  Name: Joshua Browning MRN: [id number] Date of Birth: 30-Dec-1945 Referring Provider:  Lawerance Cruel, MD  Encounter Date: 03/07/2017   Physical Therapy Parkinson's Disease Screen   Timed Up and Go test: 10.03 sec (compared from 7.94 sec at d/c)  10 meter walk test:8.96 sec (3.66 ft/sec)-compared to 4.18 ft/sec  5 time sit to stand test:12.03 sec (compared to 12.38 sec)  Patient would benefit from Physical Therapy evaluation due to slightly slowed mobility measures and reports of increased losing balance in posterior direction.  Pt also notes balance changes, particularly in work activities.      MARRIOTT,AMY W. 03/07/2017, 1:18 PM Frazier Butt., PT  [facility] [address] [employer] [place] [employer] [place], [place], [address] Phone: [phone]   Fax:  [phone]

## 2017-03-07 NOTE — Therapy (Signed)
[facility] [address] [place], [place], [address] Phone: [phone]   Fax:  [phone]  Patient Details  Name: Joshua Browning MRN: [id number] Date of Birth: [birth year] Referring Provider:  Lawerance Cruel, MD  Encounter Date: 03/07/2017   Occupational Therapy Parkinson's Disease Screen  Hand dominance:  right   Physical Performance Test item #2 (simulated eating):   15.13 sec (holds spoon at the end).  Physical Performance Test item #4 (donning/doffing jacket): 15.09sec  Fastening/unfastening 3 buttons in:  36.13sec  9-hole peg test:    RUE  33.43 sec        LUE  31.16 sec  Change in ability to perform ADLs/IADLs:  Writing with good legibility and size (difficulty generating writing/appears to demo slower processing with 1 misspelling).  Pt reports that he doesn't write much   Other Comments:  Tremors with movement  Pt would benefit from occupational therapy evaluation due to decreased coordination, would benefit from strategies for ADLs/IADLs.      [facility] 03/07/2017, 1:53 PM  [facility] [address] [place], [place], [address] Phone: [phone]   Fax:  [place], OTR/L [facility] [address] [place], [place]  [address] [phone] phone [phone] 03/07/17 3:47 PM

## 2017-03-07 NOTE — Therapy (Signed)
[facility] [address] [place], [place], [address] Phone: [phone]   Fax:  [phone]  Patient Details  Name: ILAN KAHRS MRN: [id number] Date of Birth: 20-Apr-1946 Referring Provider:  Lawerance Cruel, MD  Encounter Date: 03/07/2017   Speech Therapy Parkinson's Disease Screen   Decibel Level today: 67dB  (WNL=70-72 dB) with sound level meter 30cm away from pt's mouth. Pt's conversational volume has decreased past few months per pt report.He affirms he is having increased difficulty communicating at work and home.  Pt has not experienced difficulty in swallowing warranting objective evaluation  Pt would benefit from speech-language eval for dysarthria eval - please order via EPIC or call [phone] to schedule  Pt also reporting increased difficulty with divided attention at work and home    [place], [place], [place] 03/07/2017, 1:47 PM  [facility] [address] [place], [place], [address] Phone: [phone]   Fax:  [phone]

## 2017-03-13 ENCOUNTER — Telehealth: Payer: Self-pay | Admitting: Physical Therapy

## 2017-03-13 DIAGNOSIS — G214 Vascular parkinsonism: Secondary | ICD-10-CM

## 2017-03-13 NOTE — Telephone Encounter (Signed)
Referral entered  

## 2017-03-13 NOTE — Telephone Encounter (Signed)
Mr. Boorman was seen for our PT, OT, and speech screens on 03/07/17, and he was recommended for PT eval due to slowed mobility from previous bout of therapy (and pt's reports of LOB posteriorly), OT eval due to dificulty with coordination and ADLs, and speech eval for dysarthria.  If you agree, could you please send PT, OT, speech evals via EPIC?  Once received, our office will call patient to schedule.  Thank you.  Mady Haagensen, PT

## 2017-04-23 NOTE — Progress Notes (Signed)
Joshua Browning was seen today in the movement disorders clinic for neurologic consultation at the request of Lawerance Cruel, MD.  The consultation is for the evaluation of tremor and to r/o PD.  This patient is accompanied in the office by his spouse who supplements the history.  Tremor began about 5 years ago and involves both hands.  Tremor is most evident when he is picking up a fork or pencil or trying to hold a book.  He is R hand dominant but both hands seem to shake equally.  He thinks that it has changed/increased a little over the years.  05/30/16 update:  Pt f/u today, accompanied by his wife who supplements the history.  He had an MRI of the brain since last visit that I did review. This was done on 12/08/15.  There was at least moderate small vessel disease.  No falls since our last visit.  His balance has been worse.  He feels that he wants to fall backwards, especially on the stairs.  Notes some tremor, worse with anxiety.  Both at rest and with holding something.  Was supposed to have neuropsych testing since last visit but when called to schedule it, he declined it.  States that he was confused as to why we wanted to schedule it.    10/04/16 update:  Pt f/u today, accompanied by his wife who supplements the history.  He had neuropsych testing with Dr. Si Raider on 07/19/2016.  He has already followed up with her for the results.  Results were largely normal, with some evidence of mild cognitive impairment.  There was not evidence of a neurodegenerative dementia.  Dr. Si Raider stated that cognitive behavioral therapy could be of value.  He has been attending PT lately and I have reviewed those records.   Pt states that it really helped and he learned a lot of techniques for getting up out of cars, etc easily.  He has completed PT now.  He has had no falls.  He has had a few times where he was trying to get up off the couch and he would fall back into it.  His tremor is worse in the R hand than  the L.  Not spilling anything.  Morning is worse for tremor than afternoon  04/25/17 update:  Pt seen in f/u for tremor.  Reports that tremor has been a little worse.  Noting a little occasional jerking/twising of the right arm.  Happens when doing things like holding steering wheel.   Pt denies falls but balance is a little worse and has near falls.  When gets up, he will sometimes fall back into the couch.  Legs feel a little weak.  Pt denies lightheadedness, near syncope.  No hallucinations.  Mood has been good.  Reviewed his ophth notes since last visit.  He will return there for f/u in one year.  Also c/o "dull pain" on L side of neck.  States that he was told by PCP to talk ibuprofen and he has but it is not getting better.  He is now having more sharp, shooting pains on top of that dull pain.  Not worse to move the neck.  Also c/o terrible cramping in the hands and is so bad that has to stop eating and put down the fork.  ALLERGIES:  No Known Allergies  CURRENT MEDICATIONS:  Outpatient Encounter Prescriptions as of 04/25/2017  Medication Sig  . Cholecalciferol (VITAMIN D3) 1000 UNITS CAPS Take 1  capsule by mouth every evening.  . Glucosamine Sulfate 1000 MG CAPS Take by mouth 2 (two) times daily.  . Magnesium 100 MG CAPS Take 1 capsule by mouth every evening.  . Multiple Vitamins-Minerals (VITABASIC SENIOR PO) Take 1 tablet by mouth.  . ranitidine (ZANTAC) 150 MG tablet Take 150 mg by mouth 2 (two) times daily.  . vitamin B-12 (CYANOCOBALAMIN) 1000 MCG tablet Take 1,000 mcg by mouth every evening.   No facility-administered encounter medications on file as of 04/25/2017.     PAST MEDICAL HISTORY:   Past Medical History:  Diagnosis Date  . BPH (benign prostatic hyperplasia)   . Foley catheter in place   . Nocturnal leg cramps   . Urinary retention   . Wears glasses     PAST SURGICAL HISTORY:   Past Surgical History:  Procedure Laterality Date  . CATARACT EXTRACTION W/ INTRAOCULAR  LENS  IMPLANT, BILATERAL Bilateral   . INGUINAL HERNIA REPAIR Bilateral RIGHT 1984/  LEFT 2005  . RE-DO RIGHT INGUINAL HERNIA REPAIR AND UMBILICAL HERNIA REPAIR  2001  . REMOVAL LEFT EYE INTRAOCULAR LENS AND REPLACEMENT   08-29-2013  . TRANSURETHRAL RESECTION OF PROSTATE N/A 09/23/2013   Procedure: TRANSURETHRAL RESECTION OF THE PROSTATE (TURP) WITH GYRUS;  Surgeon: Claybon Jabs, MD;  Location: [facility];  Service: Urology;  Laterality: N/A;    SOCIAL HISTORY:   Social History   Social History  . Marital status: Married    Spouse name: N/A  . Number of children: N/A  . Years of education: N/A   Occupational History  . 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 Smoking status: Never Smoker  . Smokeless tobacco: Never Used  . Alcohol use No  . Drug use: No  . Sexual activity: Not on file   Other Topics Concern  . Not on file   Social History Narrative  . No narrative on file    FAMILY HISTORY:   Family Status  Relation Status  . Mother Deceased       CHF  . Father Deceased       DM, heart attack  . Son Alive       healthy  . Son Alive       healthy  . Son Alive       healthy    ROS:  A complete 10 system review of systems was obtained and was unremarkable apart from what is mentioned above.  PHYSICAL EXAMINATION:    VITALS:   Vitals:   04/25/17 0814  BP: 130/70  Pulse: 78  Weight: 182 lb (82.6 kg)  Height: 5\' 10"  (1.778 m)    GEN:  The patient appears stated age and is in NAD. HEENT:  Normocephalic, atraumatic.  The mucous membranes are moist. The superficial temporal arteries are without ropiness or tenderness. CV:  RRR Lungs:  CTAB Neck/HEME:  There are no carotid bruits bilaterally.  Neurological examination:  Orientation: The patient is A&O x 3 Cranial nerves: There is good facial symmetry. There is mild facial hypomimia.  Pupil on the L is slightly irregular from prior  surgery.  .The speech is fluent and clear.  He does have some occasional word finding trouble.  He is mildly hypophonic.   Soft palate rises symmetrically and there is no tongue deviation. Hearing is intact to conversational tone. Sensation: Sensation is intact to light touch throughout. Motor: Strength  is 5/5 in the bilateral upper and lower extremities.   Shoulder shrug is equal and symmetric.  There is no pronator drift.  Movement examination: Tone: There is mild increased tone in the LUE Abnormal movements: There is a mild resting tremor of the bilateral UE.   Coordination:  There is decremation only with hand opening and closing on the L.   Gait and Station: The patient has some difficulty arising out of a deep-seated chair without the use of the hands. The patient's stride length is normal but he has stooped posture and has R>LUE resting tremor.  He has a positive pull test  Labs:   Lab Results  Component Value Date   TSH 1.11 11/30/2015   Lab Results  Component Value Date   [id number] 177 11/30/2015   Lab Results  Component Value Date   FOLATE 18.8 11/30/2015     ASSESSMENT/PLAN:  1.  Parkinsonism  -He is meeting criteria now for PD, although just barely.  I think that this explains the cramping, tremor, gait change/balance.    -he is getting ready to start PT  -we will start carbidopa/levodopa 25/100 tid.  Risks, benefits, side effects and alternative therapies were discussed.  The opportunity to ask questions was given and they were answered to the best of my ability.  The patient expressed understanding and willingness to follow the outlined treatment protocols. 2.  Gait instability  -I think that this is multifactorial.    -encouraged exercise safely  3.  Cerebral small vessel disease  -if able and no hx of GI bleed, recommend ASA, 81 mg EC daily 4.  Neck pain  -will order MRI cervical spine 4.  Memory change  -He had neuropsych testing with Dr. Si Raider on 07/19/2016.   Results were largely normal, with some evidence of mild cognitive impairment.  5. Much greater than 50% of this visit was spent in counseling and coordinating care.  Total face to face time:  30 min

## 2017-04-25 ENCOUNTER — Other Ambulatory Visit: Payer: Self-pay

## 2017-04-25 ENCOUNTER — Encounter: Payer: Self-pay | Admitting: Neurology

## 2017-04-25 ENCOUNTER — Ambulatory Visit (INDEPENDENT_AMBULATORY_CARE_PROVIDER_SITE_OTHER): Payer: Medicare Other | Admitting: Neurology

## 2017-04-25 VITALS — BP 130/70 | HR 78 | Ht 70.0 in | Wt 182.0 lb

## 2017-04-25 DIAGNOSIS — G20 Parkinson's disease: Secondary | ICD-10-CM | POA: Diagnosis not present

## 2017-04-25 DIAGNOSIS — M542 Cervicalgia: Secondary | ICD-10-CM

## 2017-04-25 MED ORDER — CARBIDOPA-LEVODOPA 25-100 MG PO TABS: 1.0000 | ORAL_TABLET | Freq: Three times a day (TID) | ORAL | 1 refills | Status: DC

## 2017-04-25 NOTE — Patient Instructions (Signed)
Start carbidopa/levodopa 25/100:   1/2 tab three times a day before meals x 1 wk, then 1/2 in am & noon & 1 in evening for a week, then 1/2 in am &1 at noon &one in evening for a week, then 1 tablet three times a day before meals

## 2017-05-12 ENCOUNTER — Other Ambulatory Visit: Payer: Medicare Other

## 2017-05-13 ENCOUNTER — Other Ambulatory Visit: Payer: Self-pay | Admitting: Neurology

## 2017-05-13 ENCOUNTER — Ambulatory Visit
Admission: RE | Admit: 2017-05-13 | Discharge: 2017-05-13 | Disposition: A | Discharge status: Home / Self Care | Payer: Medicare Other | Source: Ambulatory Visit | Attending: Neurosurgery | Admitting: Neurosurgery

## 2017-05-13 DIAGNOSIS — M542 Cervicalgia: Secondary | ICD-10-CM

## 2017-05-15 ENCOUNTER — Telehealth: Payer: Self-pay | Admitting: Neurology

## 2017-05-15 DIAGNOSIS — G589 Mononeuropathy, unspecified: Secondary | ICD-10-CM

## 2017-05-15 DIAGNOSIS — S149XXA Injury of unspecified nerves of neck, initial encounter: Principal | ICD-10-CM

## 2017-05-15 NOTE — Telephone Encounter (Signed)
Left message on machine for patient to call back.

## 2017-05-15 NOTE — Telephone Encounter (Signed)
-----   Message from [place], DO sent at 05/15/2017  7:28 AM EDT ----- Reviewed.  Agree.  Let patient know that there are several areas in neck where nerve roots could be pinched going down toward arm.  Could start with PT if he would like and if not helpful, neurosurgery opinion.  See if patient agreeable and if so, could send to [facility] so doesn't have to wait so long

## 2017-05-16 NOTE — Telephone Encounter (Signed)
Spoke with patient and he is still working so having a hard time fitting in all of these appts.   He will call me back and let me know if he wants me to send referral for PT.

## 2017-05-16 NOTE — Addendum Note (Signed)
Addended byAnnamaria Helling on: 05/16/2017 02:36 PM   Modules accepted: Orders

## 2017-05-16 NOTE — Telephone Encounter (Signed)
Patient called back and requested referral to [facility] outpatient rehab since he has been there in the past. Order sent. He will call to set up.

## 2017-05-23 ENCOUNTER — Ambulatory Visit: Payer: Medicare Other | Attending: Neurology | Admitting: Physical Therapy

## 2017-05-23 ENCOUNTER — Encounter: Payer: Self-pay | Admitting: Physical Therapy

## 2017-05-23 DIAGNOSIS — R278 Other lack of coordination: Secondary | ICD-10-CM | POA: Insufficient documentation

## 2017-05-23 DIAGNOSIS — R2681 Unsteadiness on feet: Secondary | ICD-10-CM | POA: Insufficient documentation

## 2017-05-23 DIAGNOSIS — M542 Cervicalgia: Secondary | ICD-10-CM | POA: Diagnosis not present

## 2017-05-23 DIAGNOSIS — M6249 Contracture of muscle, multiple sites: Secondary | ICD-10-CM | POA: Diagnosis present

## 2017-05-23 DIAGNOSIS — R293 Abnormal posture: Secondary | ICD-10-CM | POA: Diagnosis present

## 2017-05-23 NOTE — Therapy (Signed)
[facility] [address] [place], [place], [address] Phone: [phone]   Fax:  [phone]  Physical Therapy Evaluation  Patient Details  Name: Joshua Browning MRN: [id number] Date of Birth: 06-06-1946 Referring Provider: Alonza Bogus, MD  Encounter Date: 05/23/2017      PT End of Session - 05/23/17 1441    Visit Number 1   Number of Visits 9   Date for PT Re-Evaluation 07/04/17   Authorization Type UHC Medicare G-code & progress note every 10 visits.    PT Start Time 0800   PT Stop Time 0840   PT Time Calculation (min) 40 min   Activity Tolerance Patient tolerated treatment well;Patient limited by pain   Behavior During Therapy [facility] for tasks assessed/performed      Past Medical History:  Diagnosis Date  . BPH (benign prostatic hyperplasia)   . Foley catheter in place   . Nocturnal leg cramps   . Urinary retention   . Wears glasses     Past Surgical History:  Procedure Laterality Date  . CATARACT EXTRACTION W/ INTRAOCULAR LENS  IMPLANT, BILATERAL Bilateral   . INGUINAL HERNIA REPAIR Bilateral RIGHT 1984/  LEFT 2005  . RE-DO RIGHT INGUINAL HERNIA REPAIR AND UMBILICAL HERNIA REPAIR  2001  . REMOVAL LEFT EYE INTRAOCULAR LENS AND REPLACEMENT   08-29-2013  . TRANSURETHRAL RESECTION OF PROSTATE N/A 09/23/2013   Procedure: TRANSURETHRAL RESECTION OF THE PROSTATE (TURP) WITH GYRUS;  Surgeon: Claybon Jabs, MD;  Location: [facility];  Service: Urology;  Laterality: N/A;    There were no vitals filed for this visit.       Subjective Assessment - 05/23/17 0802    Subjective Dr. Wells Guiles Tat 05/16/17 referred to PT for pinched nerve in neck. 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 He reports ~6 months ago as headache with increasing pain. Parkinsonism with hand tremors & balance issues.    Pertinent History Parkinsonism   Patient Stated Goals To  get rid of neck pain or improve level of neck pain.    Currently in Pain? Yes   Pain Score 2   In last week, worst 7/10, best 0/10   Pain Location Neck   Pain Orientation Left   Pain Descriptors / Indicators Sharp   Pain Type Chronic pain   Pain Onset More than a month ago   Pain Frequency Intermittent   Aggravating Factors  Sitting or walking without head support   Pain Relieving Factors time, it just goes away   Effect of Pain on Daily Activities limits activity until it goes away   Multiple Pain Sites No            [facility] PT Assessment - 05/23/17 0800      Assessment   Medical Diagnosis Neck pain, pinched nerve   Referring Provider Alonza Bogus, MD   Onset Date/Surgical Date 05/16/17  MD appt   Hand Dominance Right   Prior Therapy Parkinsonism PT, OT, speech     Precautions   Precautions Fall     Balance Screen   Has the patient fallen in the past 6 months No   Has the patient had a decrease in activity level because of a fear of falling?  No  cautious   Is the patient reluctant to leave their home because of a fear of falling?  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[employer], lifting battery packs   Leisure Enjoys walking in neighborhood     Observation/Other Assessments   Focus on Therapeutic Outcomes (FOTO)  54.32 Functional Status   Neck Disability Index  6.0%     Posture/Postural Control   Posture/Postural Control Postural  limitations   Postural Limitations Rounded Shoulders;Forward head;Increased thoracic kyphosis     ROM / Strength   AROM / PROM / Strength AROM;Strength     AROM   Overall AROM  Deficits   AROM Assessment Site Cervical   Cervical Flexion 41   Cervical Extension 42   Cervical - Right Side Bend 31   Cervical - Left Side Bend 23   Cervical - Right Rotation 31   Cervical - Left Rotation 41     Strength   Overall Strength Deficits   Strength Assessment Site Cervical   Cervical Flexion 2/5  flexion endurance test 2 sec of 38.5sec normal   Cervical Extension 2/5     Flexibility   Soft Tissue Assessment /Muscle Length --     Palpation   Palpation comment Left cervical & upper thoracic to T6 level tightness paraspinals, upper trapezuis, scalenes, sternocleidomastus, splenuis capitus, longissimus capitus and semispinalis capitus. ,      Special Tests    Special Tests Cervical   Cervical Tests Spurling's;Dictraction;Vertebral Artery Test     Spurling's   Findings Negative   Side Left     Distraction Test   Findngs Negative   side Left     Vertebral Artery Test    Findings Negative   Side Left     Standardized Balance Assessment   Standardized Balance Assessment Timed Up and Go Test;10 meter walk test;Five Times Sit to Stand   Five times sit to stand comments  14.67 sec  7/24 was 12.03 sec   10 Meter Walk 9.26sec for 3.54 ft/sec  7/24 was 8.96 sec for 3.66 ft/sec.      Timed Up and Go Test   Normal TUG (seconds) 9.07  7/24 was 10.03            Objective measurements completed on examination: See above findings.                    PT Short Term Goals - 08/17/16 1941      PT SHORT TERM GOAL #1   Title Pt will be independent with HEP for improved balance, posture, functional mobility.  TARGET 08/14/16   Baseline pt reports he has been doing exercises through the holidays   Time 4   Period Weeks   Status Achieved     PT SHORT TERM GOAL #2    Title Pt will improve 5x sit<>stand to less than or equal to 12.5 seconds for improved transfer efficiency and safety.   Baseline 10.06 secs - 08-17-16   Time 4   Status Achieved     PT SHORT TERM GOAL #3   Title  Pt will improve posterior step and release test to 2 steps or less with independent balance recovery, to demonstrate improved balance recovery in posterior direction.   Baseline approx. 3-4 steps taken for balance recovery - 08-17-16   Time 4   Period Weeks   Status Not Met     PT SHORT TERM GOAL #4   Title Pt will negotiate at least 4 steps with intermittent UE support, while carrying object in one hand; 5 of 5 trials no LOB, for improved stair negotiation.   Baseline carried 2 kg ball up/down steps - 08-17-16   Time 4   Period Weeks   Status Achieved     PT SHORT TERM GOAL #5   Title Pt will verbalize understanding of local Parkinson's disease related resources.   Baseline Pt unsure of available resources - 08-17-16   Time 4   Status On-going           PT Long Term Goals - 2017-06-18 1739      PT LONG TERM GOAL #1   Title Patient verbalizes and demonstrates understanding of HEP to address cervical & posture issues. (All LTGs Target Date: 07/04/17)   Time 4   Period Weeks   Status New   Target Date 07/04/17     PT LONG TERM GOAL #2   Title Patient reports cervical pain </= 2/10.    Time 4   Period Weeks   Status New   Target Date 07/04/17     PT LONG TERM GOAL #3   Title Patient's cervical range improves to flexion 50*, extension 60*, lateral flexion 40* & rotation 60*   Time 4   Period Weeks   Status New   Target Date 07/04/17     PT LONG TERM GOAL #4   Title Patient performs 5 times sit to/from stand <12 seconds.   Time 4   Period Weeks   Status New   Target Date 07/04/17                Plan - 06-18-17 1442    Clinical Impression Statement Patient presents to PT with chief compliant of intermittent cervical pain with intermittent "catching" He  has impaired posture increased cervical strain along with impaired range, strength & muscle endurance. Patient has history of Parkinsonism which complicates movement patterns.    History and Personal Factors relevant to plan of care: Parkinsonism, Prostate Hypertrophey with TURP   Clinical Presentation Stable   Clinical Decision Making Low   Rehab Potential Good   PT Frequency 2x / week   PT Duration 4 weeks   PT Treatment/Interventions Electrical Stimulation;Cryotherapy;Moist Heat;Traction;Ultrasound;DME Instruction;Gait training;Stair training;Functional mobility training;Therapeutic exercise;Therapeutic activities;Neuromuscular re-education;Balance training;Patient/family education   Consulted and Agree with Plan of Care Patient      Patient will benefit from skilled therapeutic intervention in order to improve the following deficits and impairments:  Decreased activity tolerance, Decreased balance, Decreased endurance, Decreased range of motion, Decreased strength, Postural dysfunction, Pain  Visit Diagnosis: Cervicalgia  Contracture of muscle, multiple sites  Unsteadiness on feet  Other lack of coordination  Abnormal posture      G-Codes - 06-18-2017 1748    Functional Assessment Tool Used (Outpatient Only) Neck pain 0-7/10 with limited activities. Abnormal posture with head forward & rounded shoulders.    Functional Limitation Other PT primary   Mobility: Walking and Moving Around Current Status (E0100) At least 40 percent but less than 60 percent impaired, limited or restricted   Mobility: Walking and  Moving Around Goal Status [phone]) At least 1 percent but less than 20 percent impaired, limited or restricted       Problem List Patient Active Problem List   Diagnosis Date Noted  . BPH (benign prostatic hypertrophy) with urinary retention 09/23/2013    Jamey Reas PT, DPT 05/23/2017, 5:50 PM  [facility] [address] [place], [place], [address] Phone: [phone]   Fax:  [phone]  Name: Joshua Browning MRN: [id number] Date of Birth: 1945/08/24

## 2017-05-30 ENCOUNTER — Ambulatory Visit: Payer: Medicare Other | Admitting: Physical Therapy

## 2017-05-30 ENCOUNTER — Encounter: Payer: Self-pay | Admitting: Physical Therapy

## 2017-05-30 DIAGNOSIS — R293 Abnormal posture: Secondary | ICD-10-CM

## 2017-05-30 DIAGNOSIS — M6249 Contracture of muscle, multiple sites: Secondary | ICD-10-CM

## 2017-05-30 DIAGNOSIS — M542 Cervicalgia: Secondary | ICD-10-CM

## 2017-05-30 NOTE — Patient Instructions (Addendum)
Posture Awareness    Stand and check posture: Jut chin, pull back to comfortable position. Tilt pelvis forward, back; be sure back is not swayed. Roll from heels to balls of feet, then distribute your weight evenly. Picture a line through spine pulling you erect. Focus on breathing. Good Posture = Better Breathing. Check several times per day.  http://gt2.exer.us/873   Copyright  VHI. All rights reserved.  Axial Extension (Chin Tuck)    Pull chin in and lengthen back of neck. Hold __3-5__ seconds while counting out loud. Repeat _5-10___ times. Do _3-5___ sessions per day.  http://gt2.exer.us/449   Copyright  VHI. All rights reserved.  Chin Tuck and Chest Lift Against [place] with back against wall. Pull shoulders back and toward wall while pulling head straight back. Keep jaw and eyes level. Hold _3-5__ seconds. Perform _5-10__ reps.  Copyright  VHI. All rights reserved.  SCAPULA: Retraction    Pinch shoulder blades together. Do not shrug shoulders.Push elbows down.  Hold __3-5_ seconds.  _5-10 reps per set, __3-5_ sets per day, _7__ days per week  Copyright  VHI. All rights reserved.  Shoulder Pinch     Depression (Active)    Start with erect posture. Lower shoulders. Hold __3-5__ seconds. Repeat __5-10__ times. Do __3-5__ sessions per day.  Copyright  VHI. All rights reserved.

## 2017-05-30 NOTE — Therapy (Signed)
[facility] [address] [place], [place], [address] Phone: [phone]   Fax:  [phone]  Physical Therapy Treatment  Patient Details  Name: Joshua Browning MRN: [id number] Date of Birth: Mar 19, 1946 Referring Provider: Alonza Bogus, MD  Encounter Date: 05/30/2017      PT End of Session - 05/30/17 1636    Visit Number 2   Number of Visits 9   Date for PT Re-Evaluation 07/04/17   Authorization Type U[employer] Medicare G-code & progress note every 10 visits.    PT Start Time 1530   PT Stop Time 1615   PT Time Calculation (min) 45 min   Activity Tolerance Patient tolerated treatment well   Behavior During Therapy WFL for tasks assessed/performed      Past Medical History:  Diagnosis Date  . BPH (benign prostatic hyperplasia)   . Foley catheter in place   . Nocturnal leg cramps   . Urinary retention   . Wears glasses     Past Surgical History:  Procedure Laterality Date  . CATARACT EXTRACTION W/ INTRAOCULAR LENS  IMPLANT, BILATERAL Bilateral   . INGUINAL HERNIA REPAIR Bilateral RIGHT 1984/  LEFT 2005  . RE-DO RIGHT INGUINAL HERNIA REPAIR AND UMBILICAL HERNIA REPAIR  2001  . REMOVAL LEFT EYE INTRAOCULAR LENS AND REPLACEMENT   08-29-2013  . TRANSURETHRAL RESECTION OF PROSTATE N/A 09/23/2013   Procedure: TRANSURETHRAL RESECTION OF THE PROSTATE (TURP) WITH GYRUS;  Surgeon: Claybon Jabs, MD;  Location: [facility];  Service: Urology;  Laterality: N/A;    There were no vitals filed for this visit.      Subjective Assessment - 05/30/17 1535    Subjective During church he had that spasm in left neck. He was looking up slightly to pulpit. So a little amount of cervical extension.    Pertinent History Parkinsonism   Patient Stated Goals To get rid of neck pain or improve level of neck pain.    Currently in Pain? Yes   Pain Score 2   in last few days, worst 7/10, best 0/10   Pain Location Neck   Pain  Orientation Left   Pain Descriptors / Indicators Sharp   Pain Type Chronic pain   Pain Onset More than a month ago   Pain Frequency Intermittent   Aggravating Factors  sitting or walking without head support   Pain Relieving Factors time, it just goes away                         [facility] Adult PT Treatment/Exercise - 05/30/17 1530      Self-Care   Self-Care ADL's   ADL's positioning in bed with sidelying: neutral cervical spine without lateral bend form pillows, using pillow between UEs & LEs to minimize rotation of spine with upper limbs seeking support. Pt verbalized understanding.      Exercises   Exercises Neck     Neck Exercises: Seated   Neck Retraction 10 reps;5 secs  closed chain with pillow behind   Neck Retraction Limitations mirror & demo, verbal for cervical retraction without extension   Other Seated Exercise scapular retraction + shoulder depression: 5 sec hold 10 reps.      Neck Exercises: Supine   Neck Retraction 10 reps;5 secs   Neck Retraction Limitations verbal cues on retraction without extension     Manual Therapy   Manual Therapy Soft tissue mobilization;Manual Traction   Manual therapy comments Manual therapy  to open left cervical area.    Soft tissue mobilization massage & manual stretch to Sternocliedomastoid and upper Trapezuis   Manual Traction manual traction with left shoulder depression, progressed to right side bend to stretch left upper trapezuis and lateral neck muscles.                 PT Education - 05/30/17 1624    Education provided Yes   Education Details posture, HEP for cervical retraction, scapular retraction, shoulder depression;  positioning with sidelying using pillows.    Person(s) Educated Patient   Methods Explanation;Demonstration;Tactile cues;Verbal cues;Handout   Comprehension Verbalized understanding;Returned demonstration;Verbal cues required;Tactile cues required;Need further instruction           PT Short Term Goals - 05/30/17 1637      Additional Short Term Goals   Additional Short Term Goals Yes           PT Long Term Goals - 05/30/17 1637      PT LONG TERM GOAL #1   Title Patient verbalizes and demonstrates understanding of HEP to address cervical & posture issues. (All LTGs Target Date: 07/04/17)   Time 4   Period Weeks   Status On-going   Target Date 07/04/17     PT LONG TERM GOAL #2   Title Patient reports cervical pain </= 2/10.    Time 4   Period Weeks   Status On-going   Target Date 07/04/17     PT LONG TERM GOAL #3   Title Patient's cervical range improves to flexion 50*, extension 60*, lateral flexion 40* & rotation 60*   Time 4   Period Weeks   Status On-going   Target Date 07/04/17     PT LONG TERM GOAL #4   Title Patient performs 5 times sit to/from stand <12 seconds.   Time 4   Period Weeks   Status On-going   Target Date 07/04/17               Plan - 05/30/17 1638    Clinical Impression Statement Patient appears to have basic understanding of initial exercises to work on cervical & shoulder posture. Patient verbalizes "catch" mainly with cervical extension and shoulder elevation.    Rehab Potential Good   PT Frequency 2x / week   PT Duration 4 weeks   PT Treatment/Interventions Electrical Stimulation;Cryotherapy;Moist Heat;Traction;Ultrasound;DME Instruction;Gait training;Stair training;Functional mobility training;Therapeutic exercise;Therapeutic activities;Neuromuscular re-education;Balance training;Patient/family education   PT Next Visit Plan review initial HEP & add rotation & sidebending, manual therapy & modalities for cervical muscles.    Consulted and Agree with Plan of Care Patient      Patient will benefit from skilled therapeutic intervention in order to improve the following deficits and impairments:  Decreased activity tolerance, Decreased balance, Decreased endurance, Decreased range of motion, Decreased strength,  Postural dysfunction, Pain  Visit Diagnosis: Cervicalgia  Contracture of muscle, multiple sites  Abnormal posture     Problem List Patient Active Problem List   Diagnosis Date Noted  . BPH (benign prostatic hypertrophy) with urinary retention 09/23/2013    Macklyn Glandon PT, DPT 05/30/2017, 4:41 PM  [facility] [address] [place], [place], [address] Phone: [phone]   Fax:  [phone]  Name: Joshua Browning MRN: [id number] Date of Birth: 1946-06-12

## 2017-06-01 ENCOUNTER — Encounter: Payer: Self-pay | Admitting: Physical Therapy

## 2017-06-01 ENCOUNTER — Ambulatory Visit: Payer: Medicare Other | Admitting: Physical Therapy

## 2017-06-01 DIAGNOSIS — M542 Cervicalgia: Secondary | ICD-10-CM

## 2017-06-01 DIAGNOSIS — R293 Abnormal posture: Secondary | ICD-10-CM

## 2017-06-01 DIAGNOSIS — M6249 Contracture of muscle, multiple sites: Secondary | ICD-10-CM

## 2017-06-01 NOTE — Patient Instructions (Addendum)
Pectoralis Stretch: Supine (Towel Roll)    Lie with rolled towel under spine, letting shoulders relax toward floor with arms out in a "T" formation. Lie __1-2_ minutes. Do __1-2_ times per day.  http://ss.exer.us/354   Copyright  VHI. All rights reserved.  Pectoralis Stretch With Scapula Adduction: Supine (Towel Roll)    Lie with rolled towel under spine vertically. Gently squeeze shoulder blades together. Hold for 5 seconds. Do _10_ times, 1-2 times per day.  http://ss.exer.us/356   Copyright  VHI. All rights reserved.   Roll    With tall posture, roll shoulders in backward direction. Perform this through out the day, in [place] while standing, to promote good posture and stretching out neck muscles. Repeat 10_ times.  Copyright  VHI. All rights reserved.    Upper Cervical With Lower Cervical Flexion Lock, Sitting    Sit and place one hand under hip (this stabilizes your shoulder).  Tilt head toward other shoulder while tucking chin into neck (looking toward arm-pit). Hold __30_ seconds. Repeat to other side. Repeat _3_ times per session. Do _1-2_ sessions per day.  Copyright  VHI. All rights reserved.

## 2017-06-04 NOTE — Therapy (Signed)
[facility] [address] [place], [place], [address] Phone: [phone]   Fax:  [phone]  Physical Therapy Treatment  Patient Details  Name: Joshua Browning MRN: [id number] Date of Birth: 08/20/1945 Referring Provider: Alonza Bogus, MD  Encounter Date: 06/01/2017     06/01/17 1541  PT Visits / Re-Eval  Visit Number 3  Number of Visits 9  Date for PT Re-Evaluation 07/04/17  Authorization  Authorization Type [employer] Medicare G-code & progress note every 10 visits.   PT Time Calculation  PT Start Time 1535  PT Stop Time 1615  PT Time Calculation (min) 40 min  PT - End of Session  Activity Tolerance Patient tolerated treatment well  Behavior During Therapy WFL for tasks assessed/performed    Past Medical History:  Diagnosis Date  . BPH (benign prostatic hyperplasia)   . Foley catheter in place   . Nocturnal leg cramps   . Urinary retention   . Wears glasses     Past Surgical History:  Procedure Laterality Date  . CATARACT EXTRACTION W/ INTRAOCULAR LENS  IMPLANT, BILATERAL Bilateral   . INGUINAL HERNIA REPAIR Bilateral RIGHT 1984/  LEFT 2005  . RE-DO RIGHT INGUINAL HERNIA REPAIR AND UMBILICAL HERNIA REPAIR  2001  . REMOVAL LEFT EYE INTRAOCULAR LENS AND REPLACEMENT   08-29-2013  . TRANSURETHRAL RESECTION OF PROSTATE N/A 09/23/2013   Procedure: TRANSURETHRAL RESECTION OF THE PROSTATE (TURP) WITH GYRUS;  Surgeon: Claybon Jabs, MD;  Location: [facility];  Service: Urology;  Laterality: N/A;    There were no vitals filed for this visit.     06/01/17 1537  Symptoms/Limitations  Subjective Having issues with finding a chair or wall to do the HEP at. Working on getting pillows for between knees and for arm positioning. Had some pain today, took ibuprofen and feels better.   Pertinent History Parkinsonism  Patient Stated Goals To get rid of neck pain or improve level of neck pain.   Pain Assessment    Currently in Pain? No/denies  Pain Score 0 (5/10 at worst in past 24 hours)  Pain Location Neck  Pain Descriptors / Indicators Headache;Sharp  Pain Type Chronic pain  Pain Onset More than a month ago  Pain Frequency Intermittent  Aggravating Factors  poor posturing, increased activity  Pain Relieving Factors ibuprofen, rest, time "it just goes away"      06/01/17 1844  Manual Therapy  Manual Therapy Soft tissue mobilization;Manual Traction;Myofascial release;Muscle Energy Technique  Manual therapy comments all manual therapy performed for decreased muscular tightness, postural correction and decreased pain   Soft tissue mobilization to upper traps and scalenes  Myofascial Release upper traps, scalenes and STM  Manual Traction gentle cervical distraction for decreased muscle tension. had pt lie with towel roll along spine with arms at sides: gentle cervical distraction, progressing to gentle cervical traction concurrent with scapular depression, then with scapular depression/retraction.                     Muscle Energy Technique see above with traction  Neck Exercises: Stretches  Upper Trapezius Stretch 3 reps;30 seconds;Limitations  Other Neck Stretches seated at edge of mat: posterior shoulder rolls x 10 reps,     issued the following to HEP today:   Pectoralis Stretch: Supine (Towel Roll)    Lie with rolled towel under spine, letting shoulders relax toward floor with arms out in a "T" formation. Lie __1-2_ minutes. Do __1-2_ times per day.  http://ss.exer.us/354   Copyright  VHI. All rights reserved.  Pectoralis Stretch With Scapula Adduction: Supine (Towel Roll)    Lie with rolled towel under spine vertically. Gently squeeze shoulder blades together. Hold for 5 seconds. Do _10_ times, 1-2 times per day.  http://ss.exer.us/356   Copyright  VHI. All rights reserved.   Roll    With tall posture, roll shoulders in backward direction. Perform this through out the  day, in [place] while standing, to promote good posture and stretching out neck muscles. Repeat 10_ times.  Copyright  VHI. All rights reserved.    Upper Cervical With Lower Cervical Flexion Lock, Sitting    Sit and place one hand under hip (this stabilizes your shoulder).  Tilt head toward other shoulder while tucking chin into neck (looking toward arm-pit). Hold __30_ seconds. Repeat to other side. Repeat _3_ times per session. Do _1-2_ sessions per day.  Copyright  VHI. All rights reserved.       06/01/17 1611  PT Education  Education provided Yes  Education Details added to HEP for stretching  Person(s) Educated Patient  Methods Explanation;Demonstration;Verbal cues;Handout  Comprehension Verbalized understanding;Returned demonstration;Verbal cues required;Need further instruction         PT Short Term Goals - 05/30/17 1637      Additional Short Term Goals   Additional Short Term Goals Yes           PT Long Term Goals - 05/30/17 1637      PT LONG TERM GOAL #1   Title Patient verbalizes and demonstrates understanding of HEP to address cervical & posture issues. (All LTGs Target Date: 07/04/17)   Time 4   Period Weeks   Status On-going   Target Date 07/04/17     PT LONG TERM GOAL #2   Title Patient reports cervical pain </= 2/10.    Time 4   Period Weeks   Status On-going   Target Date 07/04/17     PT LONG TERM GOAL #3   Title Patient's cervical range improves to flexion 50*, extension 60*, lateral flexion 40* & rotation 60*   Time 4   Period Weeks   Status On-going   Target Date 07/04/17     PT LONG TERM GOAL #4   Title Patient performs 5 times sit to/from stand <12 seconds.   Time 4   Period Weeks   Status On-going   Target Date 07/04/17        06/01/17 1542  Plan  Clinical Impression Statement Today's skilled session focused on decreased pain, postural re-education and muscular stretching.  Pt reported decreased tightness after session  today. Pt is progressing toward goals and should benefit from continued PT to progress toward unmet goals.   Pt will benefit from skilled therapeutic intervention in order to improve on the following deficits Decreased activity tolerance;Decreased balance;Decreased endurance;Decreased range of motion;Decreased strength;Postural dysfunction;Pain  Rehab Potential Good  PT Frequency 2x / week  PT Duration 4 weeks  PT Treatment/Interventions Electrical Stimulation;Cryotherapy;Moist Heat;Traction;Ultrasound;DME Instruction;Gait training;Stair training;Functional mobility training;Therapeutic exercise;Therapeutic activities;Neuromuscular re-education;Balance training;Patient/family education  PT Next Visit Plan add rotation & sidebending to HEP, manual therapy & modalities for cervical muscles as needed, continued to work on cervical/postural strengthening   Consulted and Agree with Plan of Care Patient       Patient will benefit from skilled therapeutic intervention in order to improve the following deficits and impairments:  Decreased activity tolerance, Decreased balance, Decreased endurance, Decreased range of motion, Decreased strength, Postural dysfunction, Pain  Visit Diagnosis: Cervicalgia  Contracture of muscle, multiple sites  Abnormal posture     Problem List Patient Active Problem List   Diagnosis Date Noted  . BPH (benign prostatic hypertrophy) with urinary retention 09/23/2013    Willow Ora, PTA, [facility] [address], [place], [place] [address] [phone] 06/04/17, 6:43 PM   Name: HILLMAN ATTIG MRN: [id number] Date of Birth: March 09, 1946

## 2017-06-06 ENCOUNTER — Encounter: Payer: Self-pay | Admitting: Physical Therapy

## 2017-06-06 ENCOUNTER — Ambulatory Visit: Payer: Medicare Other | Admitting: Physical Therapy

## 2017-06-06 DIAGNOSIS — M542 Cervicalgia: Secondary | ICD-10-CM

## 2017-06-06 DIAGNOSIS — R293 Abnormal posture: Secondary | ICD-10-CM

## 2017-06-06 DIAGNOSIS — M6249 Contracture of muscle, multiple sites: Secondary | ICD-10-CM

## 2017-06-06 NOTE — Therapy (Signed)
[facility] [address] [place], [place], [address] Phone: [phone]   Fax:  [phone]  Physical Therapy Treatment  Patient Details  Name: EARNIE BECHARD MRN: [id number] Date of Birth: 1945-11-23 Referring Provider: Alonza Bogus, MD  Encounter Date: 06/06/2017      PT End of Session - 06/06/17 1536    Visit Number 4   Number of Visits 9   Date for PT Re-Evaluation 07/04/17   Authorization Type UHC Medicare G-code & progress note every 10 visits.    PT Start Time 1533   PT Stop Time 1615   PT Time Calculation (min) 42 min   Activity Tolerance Patient tolerated treatment well   Behavior During Therapy WFL for tasks assessed/performed      Past Medical History:  Diagnosis Date  . BPH (benign prostatic hyperplasia)   . Foley catheter in place   . Nocturnal leg cramps   . Urinary retention   . Wears glasses     Past Surgical History:  Procedure Laterality Date  . CATARACT EXTRACTION W/ INTRAOCULAR LENS  IMPLANT, BILATERAL Bilateral   . INGUINAL HERNIA REPAIR Bilateral RIGHT 1984/  LEFT 2005  . RE-DO RIGHT INGUINAL HERNIA REPAIR AND UMBILICAL HERNIA REPAIR  2001  . REMOVAL LEFT EYE INTRAOCULAR LENS AND REPLACEMENT   08-29-2013  . TRANSURETHRAL RESECTION OF PROSTATE N/A 09/23/2013   Procedure: TRANSURETHRAL RESECTION OF THE PROSTATE (TURP) WITH GYRUS;  Surgeon: Claybon Jabs, MD;  Location: [facility];  Service: Urology;  Laterality: N/A;    There were no vitals filed for this visit.      Subjective Assessment - 06/06/17 1535    Subjective No new complaints. No pain currently, some muscle soreness from the exercises. Worst pain in last 24 hours 4/10.     Pertinent History Parkinsonism   Patient Stated Goals To get rid of neck pain or improve level of neck pain.    Currently in Pain? No/denies   Pain Score 0-No pain                         [facility] Adult PT  Treatment/Exercise - 06/06/17 1538      Neck Exercises: Standing   Upper Extremity Flexion with Stabilization 10 reps;Limitations   UE Flexion with Stabilization Limitations rolling ball up wall with flexion stretch, 5 second holds x 10 reps       Neck Exercises: Seated   Other Seated Exercise seated at edge of mat with red band: shoulder horizontal abduction and alternating diagonals x 10 reps with cues on posture and ex form                               Neck Exercises: Supine   Neck Retraction 10 reps;5 secs;Limitations   Neck Retraction Limitations with towel roll along spine and arms at sides, cues on form and technique     Manual Therapy   Manual Therapy Soft tissue mobilization;Muscle Energy Technique;Manual Traction   Manual therapy comments all manual therapy performed for decreased muscular tightness, postural correction and decreased pain    Soft tissue mobilization to upper traps and scalenes   Myofascial Release upper traps, scalenes and STM   Manual Traction gentle cervical distraction for decreased muscle tension. had pt lie with towel roll along spine with arms at sides: gentle cervical distraction, progressing to gentle cervical traction concurrent with  scapular depression, then with scapular depression/retraction.                      Muscle Energy Technique see above with traction     Neck Exercises: Stretches   Upper Trapezius Stretch 2 reps;30 seconds;Limitations   Other Neck Stretches seated at edge of mat: posterior shoulder rolls x 10 reps,                   PT Short Term Goals - 05/30/17 1637      Additional Short Term Goals   Additional Short Term Goals Yes           PT Long Term Goals - 05/30/17 1637      PT LONG TERM GOAL #1   Title Patient verbalizes and demonstrates understanding of HEP to address cervical & posture issues. (All LTGs Target Date: 07/04/17)   Time 4   Period Weeks   Status On-going   Target Date 07/04/17     PT LONG  TERM GOAL #2   Title Patient reports cervical pain </= 2/10.    Time 4   Period Weeks   Status On-going   Target Date 07/04/17     PT LONG TERM GOAL #3   Title Patient's cervical range improves to flexion 50*, extension 60*, lateral flexion 40* & rotation 60*   Time 4   Period Weeks   Status On-going   Target Date 07/04/17     PT LONG TERM GOAL #4   Title Patient performs 5 times sit to/from stand <12 seconds.   Time 4   Period Weeks   Status On-going   Target Date 07/04/17               Plan - 06/06/17 1537    Clinical Impression Statement Today's skilled session continued to focus on cervical strenthening and decreased pain. No issues reported with session today. Pt is progressing toward goals and should benefit from continued PT to progress toward unmet goals.                           Rehab Potential Good   PT Frequency 2x / week   PT Duration 4 weeks   PT Treatment/Interventions Electrical Stimulation;Cryotherapy;Moist Heat;Traction;Ultrasound;DME Instruction;Gait training;Stair training;Functional mobility training;Therapeutic exercise;Therapeutic activities;Neuromuscular re-education;Balance training;Patient/family education   PT Next Visit Plan add rotation & sidebending to HEP, manual therapy & modalities for cervical muscles as needed, continued to work on cervical/postural strengthening    Consulted and Agree with Plan of Care Patient      Patient will benefit from skilled therapeutic intervention in order to improve the following deficits and impairments:  Decreased activity tolerance, Decreased balance, Decreased endurance, Decreased range of motion, Decreased strength, Postural dysfunction, Pain  Visit Diagnosis: Cervicalgia  Abnormal posture  Contracture of muscle, multiple sites     Problem List Patient Active Problem List   Diagnosis Date Noted  . BPH (benign prostatic hypertrophy) with urinary retention 09/23/2013    Willow Ora 06/06/2017, 11:13 PM  [facility] [address] [place], [place], [address] Phone: [phone]   Fax:  [phone]  Name: MORELL MEARS MRN: [id number] Date of Birth: 1946/06/05

## 2017-06-07 ENCOUNTER — Encounter: Payer: Self-pay | Admitting: Physical Therapy

## 2017-06-07 ENCOUNTER — Ambulatory Visit: Payer: Medicare Other | Admitting: Physical Therapy

## 2017-06-07 DIAGNOSIS — M542 Cervicalgia: Secondary | ICD-10-CM

## 2017-06-07 DIAGNOSIS — R293 Abnormal posture: Secondary | ICD-10-CM

## 2017-06-07 NOTE — Therapy (Signed)
[facility] [address] [place], [place], [address] Phone: [phone]   Fax:  [phone]  Physical Therapy Treatment  Patient Details  Name: Joshua Browning MRN: [id number] Date of Birth: [birth year] Referring Provider: Alonza Bogus, MD  Encounter Date: 06/07/2017      PT End of Session - 06/07/17 1626    Visit Number 5   Number of Visits 9   Date for PT Re-Evaluation 07/04/17   Authorization Type UHC Medicare G-code & progress note every 10 visits.    PT Start Time 1619   PT Stop Time 1659   PT Time Calculation (min) 40 min   Activity Tolerance Patient tolerated treatment well   Behavior During Therapy WFL for tasks assessed/performed      Past Medical History:  Diagnosis Date  . BPH (benign prostatic hyperplasia)   . Foley catheter in place   . Nocturnal leg cramps   . Urinary retention   . Wears glasses     Past Surgical History:  Procedure Laterality Date  . CATARACT EXTRACTION W/ INTRAOCULAR LENS  IMPLANT, BILATERAL Bilateral   . INGUINAL HERNIA REPAIR Bilateral RIGHT 1984/  LEFT 2005  . RE-DO RIGHT INGUINAL HERNIA REPAIR AND UMBILICAL HERNIA REPAIR  2001  . REMOVAL LEFT EYE INTRAOCULAR LENS AND REPLACEMENT   08-29-2013  . TRANSURETHRAL RESECTION OF PROSTATE N/A 09/23/2013   Procedure: TRANSURETHRAL RESECTION OF THE PROSTATE (TURP) WITH GYRUS;  Surgeon: Claybon Jabs, MD;  Location: [facility];  Service: Urology;  Laterality: N/A;    There were no vitals filed for this visit.      Subjective Assessment - 06/07/17 1620    Subjective No new complaints. No falls. Some soreness in right neck from yesterday's exercises, Has not done any exercises today due to just here yesterday and worked all day today. Did do some of the stretches while driving around today.    Pertinent History Parkinsonism   Patient Stated Goals To get rid of neck pain or improve level of neck pain.    Currently in  Pain? Yes   Pain Score 2    Pain Location Neck   Pain Orientation Left   Pain Descriptors / Indicators Aching;Sore   Pain Type Chronic pain   Pain Onset More than a month ago   Pain Frequency Intermittent   Aggravating Factors  poor posture, increased activity   Pain Relieving Factors ibuprofen, rest, and time "it just goes away"             [facility] Adult PT Treatment/Exercise - 06/07/17 1624      Neck Exercises: Theraband   Shoulder Extension 10 reps;Red   Shoulder Extension Limitations cues on posture and ex form needed   Other Theraband Exercises bil ER with elbows at sides using red band x 10 reps with emphasis on posture and equal UE movments.      Neck Exercises: Standing   Upper Extremity Flexion with Stabilization 10 reps;Limitations   UE Flexion with Stabilization Limitations rolling ball up wall with flexion stretch, 5 second holds x 10 reps     Other Standing Exercises with red pball: small circles both ways with emphasis on scapular stability, then using smaller red ball had pt perform single UE ball circles with emphasis on scapular stability.     Neck Exercises: Seated   Other Seated Exercise seated at edge of mat with red band: alternating diagonals x 10 reps,  Manual Therapy   Manual Therapy Muscle Energy Technique;Manual Traction   Manual therapy comments all manual therapy performed for decreased muscular tightness, postural correction and decreased pain    Manual Traction gentle cervical distraction for decreased muscle tension. had pt lie with towel roll along spine with arms at sides: gentle cervical distraction, progressing to gentle cervical traction concurrent with scapular depression, then with scapular depression/retraction.                      Muscle Energy Technique see above with traction     Neck Exercises: Stretches   Chest Stretch 3 reps;30 seconds;Limitations   Chest Stretch Limitations single arm at a time: on wall  in partial "W" with elbow flexion to 90 and arm held up at shoulder level. pt stepped forward and rotated partially for stretch. cues needed on correct ex form/technique.    Other Neck Stretches lateral flexion stretch- see pt instructions for details            PT Education - 06/07/17 1653    Education provided Yes   Education Details added lateral flexion stretch to HEP today   Person(s) Educated Patient   Methods Explanation;Demonstration;Verbal cues;Handout   Comprehension Verbalized understanding;Returned demonstration;Verbal cues required          PT Short Term Goals - 05/30/17 1637      Additional Short Term Goals   Additional Short Term Goals Yes           PT Long Term Goals - 05/30/17 1637      PT LONG TERM GOAL #1   Title Patient verbalizes and demonstrates understanding of HEP to address cervical & posture issues. (All LTGs Target Date: 07/04/17)   Time 4   Period Weeks   Status On-going   Target Date 07/04/17     PT LONG TERM GOAL #2   Title Patient reports cervical pain </= 2/10.    Time 4   Period Weeks   Status On-going   Target Date 07/04/17     PT LONG TERM GOAL #3   Title Patient's cervical range improves to flexion 50*, extension 60*, lateral flexion 40* & rotation 60*   Time 4   Period Weeks   Status On-going   Target Date 07/04/17     PT LONG TERM GOAL #4   Title Patient performs 5 times sit to/from stand <12 seconds.   Time 4   Period Weeks   Status On-going   Target Date 07/04/17               Plan - 06/07/17 1626    Clinical Impression Statement Today's skilled session focused on decreasing pain, increasing strength and promoting improved posture. Added new cervical stretch for lateral flexion today with no issues reported in session. Pt is making steady progress toward goals and should benefit from continued PT to progress toward unmet goals.    Rehab Potential Good   PT Frequency 2x / week   PT Duration 4 weeks   PT  Treatment/Interventions Electrical Stimulation;Cryotherapy;Moist Heat;Traction;Ultrasound;DME Instruction;Gait training;Stair training;Functional mobility training;Therapeutic exercise;Therapeutic activities;Neuromuscular re-education;Balance training;Patient/family education   PT Next Visit Plan continue to work on cervical/postural strengthening, manual therapy and modalities as needed for pain   Consulted and Agree with Plan of Care Patient      Patient will benefit from skilled therapeutic intervention in order to improve the following deficits and impairments:  Decreased activity tolerance, Decreased balance, Decreased endurance, Decreased range of  motion, Decreased strength, Postural dysfunction, Pain  Visit Diagnosis: Cervicalgia  Abnormal posture     Problem List Patient Active Problem List   Diagnosis Date Noted  . BPH (benign prostatic hypertrophy) with urinary retention 09/23/2013    Willow Ora, PTA, [facility] [address] [place], [place] [address] [phone] 06/07/17, 5:03 PM   Name: HARDIN HARDENBROOK MRN: [id number] Date of Birth: May 23, 1946

## 2017-06-07 NOTE — Patient Instructions (Addendum)
Flexibility: Upper Trapezius Stretch    Gently grasp right side of head while reaching behind back with other hand or sit on that hand palm up. Tilt head away until a gentle stretch is felt. Hold _30_ seconds. Repeat _3_ times per set. Do _1_ sets per session. Do _1-2_ sessions per day.  http://orth.exer.us/340   Copyright  VHI. All rights reserved.

## 2017-06-13 ENCOUNTER — Encounter: Payer: Self-pay | Admitting: Physical Therapy

## 2017-06-13 ENCOUNTER — Ambulatory Visit: Payer: Medicare Other | Admitting: Physical Therapy

## 2017-06-13 DIAGNOSIS — M542 Cervicalgia: Secondary | ICD-10-CM | POA: Diagnosis not present

## 2017-06-13 DIAGNOSIS — R293 Abnormal posture: Secondary | ICD-10-CM

## 2017-06-13 NOTE — Therapy (Signed)
[facility] [address] [place], [place], [address] Phone: [phone]   Fax:  [phone]  Physical Therapy Treatment  Patient Details  Name: Joshua Browning MRN: [id number] Date of Birth: 03-13-1946 Referring Provider: Alonza Bogus, MD  Encounter Date: 06/13/2017      PT End of Session - 06/13/17 1538    Visit Number 6   Number of Visits 9   Date for PT Re-Evaluation 07/04/17   Authorization Type [employer] Medicare G-code & progress note every 10 visits.    PT Start Time 1532   PT Stop Time 1612   PT Time Calculation (min) 40 min   Activity Tolerance Patient tolerated treatment well   Behavior During Therapy WFL for tasks assessed/performed      Past Medical History:  Diagnosis Date  . BPH (benign prostatic hyperplasia)   . Foley catheter in place   . Nocturnal leg cramps   . Urinary retention   . Wears glasses     Past Surgical History:  Procedure Laterality Date  . CATARACT EXTRACTION W/ INTRAOCULAR LENS  IMPLANT, BILATERAL Bilateral   . INGUINAL HERNIA REPAIR Bilateral RIGHT 1984/  LEFT 2005  . RE-DO RIGHT INGUINAL HERNIA REPAIR AND UMBILICAL HERNIA REPAIR  2001  . REMOVAL LEFT EYE INTRAOCULAR LENS AND REPLACEMENT   08-29-2013  . TRANSURETHRAL RESECTION OF PROSTATE N/A 09/23/2013   Procedure: TRANSURETHRAL RESECTION OF THE PROSTATE (TURP) WITH GYRUS;  Surgeon: Claybon Jabs, MD;  Location: [facility];  Service: Urology;  Laterality: N/A;    There were no vitals filed for this visit.      Subjective Assessment - 06/13/17 1534    Subjective No new complaints. Having a some pain the past few days, "like a dull headache in the neck". 'Okay" at the moment. Reports he had been trying to monitor his posture more, especially with driving.    Pertinent History Parkinsonism   Patient Stated Goals To get rid of neck pain or improve level of neck pain.    Currently in Pain? Yes   Pain Score 1    Pain Location Neck   Pain Descriptors / Indicators Sore;Dull;Aching   Pain Type Chronic pain   Pain Onset More than a month ago   Pain Frequency Intermittent   Aggravating Factors  poor posture, increased activity   Pain Relieving Factors ibuprofen, rest            [facility] Adult PT Treatment/Exercise - 06/13/17 1539      Posture/Postural Control   Posture/Postural Control Postural limitations   Postural Limitations Rounded Shoulders;Forward head;Increased thoracic kyphosis     Neck Exercises: Machines for Strengthening   UBE (Upper Arm Bike) level 1.5 x 2 minutes each fwd/bwd with cues/emphasis on tall posture with scapular retraction     Neck Exercises: Theraband   Shoulder Extension 10 reps;Red   Shoulder Extension Limitations in standing cues on posture and ex form needed   Rows 10 reps;Red;Limitations   Rows Limitations in standing, cues for hold time, posture and ex form/technique   Horizontal ABduction 10 reps;Red;Limitations   Horizontal ABduction Limitations with back to door frame to ensure good posture, cues on hold time and ex form/technique   Other Theraband Exercises with back to door frame to ensure good posture: alternating diagonal pulls x 10 each way with cues on form and technique      Neck Exercises: Standing   Upper Extremity Flexion with Stabilization 10 reps;Limitations   UE  Flexion with Stabilization Limitations rolling ball up wall with flexion stretch, 5 second holds x 10 reps     Other Standing Exercises with red pball: small circles both ways with emphasis on scapular stability, then using smaller red ball had pt perform single UE ball circles with emphasis on scapular stability.   Other Standing Exercises with red pball at wall: push up with emphasis on posture adn scapular movements while maintaining scapular depression      Neck Exercises: Seated   X to V 10 reps;Limitations   X to V Weights (lbs) seated at edge of mat with cues on ex form and  technique,cues to maintain tall posture    Shoulder Rolls Backwards;10 reps;Limitations   Shoulder Rolls Limitations cues for backward rolls and to slow down for improved muscular activation/movements     Manual Therapy   Manual Therapy Manual Traction;Soft tissue mobilization;Other (comment)   Manual therapy comments all manual therapy performed for decreased muscular tightness, postural correction and decreased pain    Soft tissue mobilization to upper traps and scalenes   Manual Traction for 4-50 sec holds x 5 reps for decreased muscular tightness   Other Manual Therapy passive stretching on bil upper traps, scalenes and STCM muscles for 30 sec's x 3-4 reps each              PT Short Term Goals - 05/30/17 1637      Additional Short Term Goals   Additional Short Term Goals Yes           PT Long Term Goals - 05/30/17 1637      PT LONG TERM GOAL #1   Title Patient verbalizes and demonstrates understanding of HEP to address cervical & posture issues. (All LTGs Target Date: 07/04/17)   Time 4   Period Weeks   Status On-going   Target Date 07/04/17     PT LONG TERM GOAL #2   Title Patient reports cervical pain </= 2/10.    Time 4   Period Weeks   Status On-going   Target Date 07/04/17     PT LONG TERM GOAL #3   Title Patient's cervical range improves to flexion 50*, extension 60*, lateral flexion 40* & rotation 60*   Time 4   Period Weeks   Status On-going   Target Date 07/04/17     PT LONG TERM GOAL #4   Title Patient performs 5 times sit to/from stand <12 seconds.   Time 4   Period Weeks   Status On-going   Target Date 07/04/17            Plan - 06/13/17 1538    Clinical Impression Statement Today's skilled session focused on decreased pain/tightness, strengthening and postural re-education. No issues reported with session today. Pt is making steady progress toward goals and should benefit from continued PT to progress toward unmet goals.    Rehab  Potential Good   PT Frequency 2x / week   PT Duration 4 weeks   PT Treatment/Interventions Electrical Stimulation;Cryotherapy;Moist Heat;Traction;Ultrasound;DME Instruction;Gait training;Stair training;Functional mobility training;Therapeutic exercise;Therapeutic activities;Neuromuscular re-education;Balance training;Patient/family education   PT Next Visit Plan continue to work on cervical/postural strengthening, manual therapy and modalities as needed for pain   Consulted and Agree with Plan of Care Patient      Patient will benefit from skilled therapeutic intervention in order to improve the following deficits and impairments:  Decreased activity tolerance, Decreased balance, Decreased endurance, Decreased range of motion, Decreased strength, Postural dysfunction, Pain  Visit Diagnosis: Cervicalgia  Abnormal posture     Problem List Patient Active Problem List   Diagnosis Date Noted  . BPH (benign prostatic hypertrophy) with urinary retention 09/23/2013    Willow Ora, PTA, [facility] [address] [place], [place] [address] [phone] 06/13/17, 4:18 PM   Name: Joshua Browning MRN: [id number] Date of Birth: [birth year]

## 2017-06-16 ENCOUNTER — Encounter: Payer: Self-pay | Admitting: Physical Therapy

## 2017-06-16 ENCOUNTER — Ambulatory Visit: Payer: Medicare Other | Attending: Neurology | Admitting: Physical Therapy

## 2017-06-16 DIAGNOSIS — M542 Cervicalgia: Secondary | ICD-10-CM | POA: Diagnosis not present

## 2017-06-16 DIAGNOSIS — R293 Abnormal posture: Secondary | ICD-10-CM | POA: Insufficient documentation

## 2017-06-16 NOTE — Therapy (Signed)
[facility] [address] [place], [place], [address] Phone: [phone]   Fax:  [phone]  Physical Therapy Treatment  Patient Details  Name: Joshua Browning MRN: [id number] Date of Birth: March 20, 1946 Referring Provider: Alonza Bogus, MD  Encounter Date: 06/16/2017      PT End of Session - 06/16/17 1539    Visit Number 7   Number of Visits 9   Date for PT Re-Evaluation 07/04/17   Authorization Type [employer] Medicare G-code & progress note every 10 visits.    PT Start Time 1533   PT Stop Time 1618   PT Time Calculation (min) 45 min   Activity Tolerance Patient tolerated treatment well   Behavior During Therapy WFL for tasks assessed/performed      Past Medical History:  Diagnosis Date  . BPH (benign prostatic hyperplasia)   . Foley catheter in place   . Nocturnal leg cramps   . Urinary retention   . Wears glasses     Past Surgical History:  Procedure Laterality Date  . CATARACT EXTRACTION W/ INTRAOCULAR LENS  IMPLANT, BILATERAL Bilateral   . INGUINAL HERNIA REPAIR Bilateral RIGHT 1984/  LEFT 2005  . RE-DO RIGHT INGUINAL HERNIA REPAIR AND UMBILICAL HERNIA REPAIR  2001  . REMOVAL LEFT EYE INTRAOCULAR LENS AND REPLACEMENT   08-29-2013  . TRANSURETHRAL RESECTION OF PROSTATE N/A 09/23/2013   Procedure: TRANSURETHRAL RESECTION OF THE PROSTATE (TURP) WITH GYRUS;  Surgeon: Claybon Jabs, MD;  Location: [facility];  Service: Urology;  Laterality: N/A;    There were no vitals filed for this visit.      Subjective Assessment - 06/16/17 1537    Subjective No new complaints, no falls, no pain.    Pertinent History Parkinsonism   Patient Stated Goals To get rid of neck pain or improve level of neck pain.    Currently in Pain? No/denies            [facility] Adult PT Treatment/Exercise - 06/16/17 0001      Neuro Re-ed    Neuro Re-ed Details  Manual cervical traction with pt in supine with towel roll  under spine and head rested on pillow, therapist performing while pt retracting and depressing scapulas, x 10 reps/2 sets. Pt tolerated this well with cues for technique and to breathe.    Pt seated on mat with therapist behind pulling shoulders into scapular retraction to stretch pectoral muscles while pt relaxes and depresses the shoulders. Pt tolerated this well with cues for technique and to breathe.      Exercises   Exercises Neck;Shoulder     Neck Exercises: Machines for Strengthening   UBE (Upper Arm Bike) Level 1.5x 2 mins fwds/2 mins bkwds with cues on posture     Neck Exercises: Theraband   Horizontal ABduction Red;10 reps;Limitations   Horizontal ABduction Limitations Pt grabbing RTB by both ends and pulling against resistance with elbows straight/arms at 90, pt performed 10 reps/2 sets. cues on posture and shoulder retraction     Shoulder Exercises: Seated   Other Seated Exercises Pt performing shoulder circles in bkwds direction with arms by side, x10 reps/ 2 sets, pt required cues for posture.      Shoulder Exercises: Prone   Other Prone Exercises Prone on mat with forehead resting on towel roll, pt lifting arms up and in to perform scapular retraction, x10 reps/10 sec hold. Pt tolerated this well with only initial cue for instruction and technique.  Shoulder Exercises: Standing   Extension AROM;Strengthening;Both;10 reps;Theraband;Limitations;Other (comment)  2 sets, 3 sec hold   Theraband Level (Shoulder Extension) Level 2 (Red)   Extension Limitations RTB tied and put in door frame at chest level, pt pulling straight down into extension with elbows straight, against resistance x10 reps/2 sets, pt tolerated this well with cue for posture   Retraction AROM;Strengthening;Both;10 reps;Theraband;Limitations;Other (comment)  2 sets, 3 sec hold   Theraband Level (Shoulder Retraction) Level 2 (Red)   Retraction Limitations RTB tied and placed in door frame at chest level pt  with arms by side and elbows at 90 degs, pt pulling straight back keeping elbows bent for scapular retraction against resistance. Pt tolerated with well with cue for posture.      Shoulder Exercises: Therapy Ball   Flexion 10 reps;Other (comment);Limitations  3 sets   Flexion Limitations Arms at 180 deg with elbows straight holding yellow physioball, then beinding at the elbows slowly and then straightening elbows again. Pt tolerated this task well with only cues on initial instruction and to bring arms up higher into flexion.              PT Long Term Goals - 06/16/17 1539      PT LONG TERM GOAL #1   Title Patient verbalizes and demonstrates understanding of HEP to address cervical & posture issues. (All LTGs Target Date: 07/04/17)   Time 4   Period Weeks   Status On-going     PT LONG TERM GOAL #2   Title Patient reports cervical pain </= 2/10.    Time 4   Period Weeks   Status On-going     PT LONG TERM GOAL #3   Title Patient's cervical range improves to flexion 50*, extension 60*, lateral flexion 40* & rotation 60*   Time 4   Period Weeks   Status On-going     PT LONG TERM GOAL #4   Title Patient performs 5 times sit to/from stand <12 seconds.   Time 4   Period Weeks   Status On-going               Plan - 06/16/17 1656    Clinical Impression Statement Pt tolerated treatment well with no limitations due to pain or fatigue. Todays session focused on decreasing tightness in chest, strengthening scapular muscles and focusing on posture. Pt is progressing and should benefit from continued PT.    Rehab Potential Good   PT Frequency 2x / week   PT Duration 4 weeks   PT Treatment/Interventions Electrical Stimulation;Cryotherapy;Moist Heat;Traction;Ultrasound;DME Instruction;Gait training;Stair training;Functional mobility training;Therapeutic exercise;Therapeutic activities;Neuromuscular re-education;Balance training;Patient/family education   PT Next Visit Plan  continue to work on cervical/postural strengthening, manual therapy and modalities as needed for pain   Consulted and Agree with Plan of Care Patient      Patient will benefit from skilled therapeutic intervention in order to improve the following deficits and impairments:  Decreased activity tolerance, Decreased balance, Decreased endurance, Decreased range of motion, Decreased strength, Postural dysfunction, Pain  Visit Diagnosis: Cervicalgia  Abnormal posture     Problem List Patient Active Problem List   Diagnosis Date Noted  . BPH (benign prostatic hypertrophy) with urinary retention 09/23/2013   Thatcher Doberstein, SPTA  [place] Con[place] 06/16/2017, 5:00 PM  [facility] [address] [place], [place], [address] Phone: [phone]   Fax:  [phone]  Name: Joshua Browning MRN: [id number] Date of Birth: [birth year]

## 2017-06-20 ENCOUNTER — Encounter: Payer: Self-pay | Admitting: Physical Therapy

## 2017-06-20 ENCOUNTER — Ambulatory Visit: Payer: Medicare Other | Admitting: Physical Therapy

## 2017-06-20 ENCOUNTER — Telehealth: Payer: Self-pay | Admitting: Neurology

## 2017-06-20 DIAGNOSIS — M542 Cervicalgia: Secondary | ICD-10-CM | POA: Diagnosis not present

## 2017-06-20 DIAGNOSIS — R293 Abnormal posture: Secondary | ICD-10-CM

## 2017-06-20 NOTE — Telephone Encounter (Signed)
Would like tot alk to someone about his medication carbidopa levopda making his feel sick on his stomach in the morning please call

## 2017-06-20 NOTE — Telephone Encounter (Signed)
Patient states he is doing well since starting Carbidopa Levodopa 25/100 IR - one three times daily. It is helping a lot and tremors are much better. His only complaint is morning nausea. He doesn't get nausea with other two doses. He had experimented with different ways to take it. He will get up and eat some crackers and take the medication, then eat something light like a banana 30 minutes later. This was helping for a few weeks, but now he has a lot of nausea the last two weeks. He had one episode of vomiting this morning. The last two weeks he is only eating crackers in the morning. I advised he may just not have enough on his stomach and suggested he try eating more prior to taking medication. He works and doesn't have a lot of time to eat breakfast, but will try to add a granola bar or oatmeal.   Dr. Carles Collet any other suggestions?

## 2017-06-20 NOTE — Telephone Encounter (Signed)
Can give him zofran to try for the next few few weeks to just try with the AM dose if he would like.  I would only use the 4 mg dosage.  No more than 2 weeks.

## 2017-06-20 NOTE — Telephone Encounter (Signed)
Left message on machine for patient to call back.

## 2017-06-20 NOTE — Therapy (Signed)
[facility] [address] [place], [place], [address] Phone: [phone]   Fax:  [phone]  Physical Therapy Treatment  Patient Details  Name: Joshua Browning MRN: [id number] Date of Birth: July 01, 1946 Referring Provider: Alonza Bogus, MD   Encounter Date: 06/20/2017  PT End of Session - 06/20/17 1534    Visit Number  8    Number of Visits  9    Date for PT Re-Evaluation  07/04/17    Authorization Type  [employer] Medicare G-code & progress note every 10 visits.     PT Start Time  1532    PT Stop Time  1617    PT Time Calculation (min)  45 min    Activity Tolerance  Patient tolerated treatment well    Behavior During Therapy  WFL for tasks assessed/performed       Past Medical History:  Diagnosis Date  . BPH (benign prostatic hyperplasia)   . Foley catheter in place   . Nocturnal leg cramps   . Urinary retention   . Wears glasses     Past Surgical History:  Procedure Laterality Date  . CATARACT EXTRACTION W/ INTRAOCULAR LENS  IMPLANT, BILATERAL Bilateral   . INGUINAL HERNIA REPAIR Bilateral RIGHT 1984/  LEFT 2005  . RE-DO RIGHT INGUINAL HERNIA REPAIR AND UMBILICAL HERNIA REPAIR  2001  . REMOVAL LEFT EYE INTRAOCULAR LENS AND REPLACEMENT   08-29-2013    There were no vitals filed for this visit.  Subjective Assessment - 06/20/17 1535    Subjective  No new complaints, no falls, some pain possibly related to stress.      Pertinent History  Parkinsonism    Patient Stated Goals  To get rid of neck pain or improve level of neck pain.     Currently in Pain?  Yes    Pain Score  1  yesterday a 2/10    yesterday a 2/10    Pain Location  Neck    Pain Orientation  Upper    Pain Descriptors / Indicators  Dull;Aching;Sore    Pain Type  Chronic pain    Pain Onset  More than a month ago        [facility] Adult PT Treatment/Exercise - 06/20/17 0001      Neck Exercises: Theraband   Other Theraband Exercises  Pt lying supine  on mat while therapist applies manual traction with one hand and providing upper trap area stretch with other hand. x5 mins. Pt required cues to breathe and relax.       Neck Exercises: Seated   W Back  10 reps;Limitations;Other (comment);Weight 2 sets   2 sets   W Back Weights (lbs)  2    W Back Limitations  Pt standing with back to wall with 2# weights x10 reps/2 sets       Shoulder Exercises: Standing   Other Standing Exercises  Standing with back against wall using RTB to perform horizontal abduction/diagonals both ways and flexion/extension holding both ends of band x10 reps each/2 sets. Cues for posture       Shoulder Exercises: ROM/Strengthening   UBE (Upper Arm Bike)  x3 mins each way resistance set at 3, cues for posture           PT Long Term Goals - 06/20/17 1534      PT LONG TERM GOAL #1   Title  Patient verbalizes and demonstrates understanding of HEP to address cervical & posture issues. (All LTGs Target Date:  07/04/17)    Time  4    Period  Weeks    Status  On-going      PT LONG TERM GOAL #2   Title  Patient reports cervical pain </= 2/10.     Time  4    Period  Weeks    Status  On-going      PT LONG TERM GOAL #3   Title  Patient's cervical range improves to flexion 50*, extension 60*, lateral flexion 40* & rotation 60*    Time  4    Period  Weeks    Status  On-going      PT LONG TERM GOAL #4   Title  Patient performs 5 times sit to/from stand <12 seconds.    Time  4    Period  Weeks    Status  On-going        Plan - 06/20/17 1621    Clinical Impression Statement  Pt tolerated treatment well with no limitations due to pain or fatigue. Todays session focused on upper body strengthening/stretching with emphasis on posture. Pt is progressing towards goals and should benefit from continued PT.     Rehab Potential  Good    PT Frequency  2x / week    PT Duration  4 weeks    PT Treatment/Interventions  Electrical Stimulation;Cryotherapy;Moist  Heat;Traction;Ultrasound;DME Instruction;Gait training;Stair training;Functional mobility training;Therapeutic exercise;Therapeutic activities;Neuromuscular re-education;Balance training;Patient/family education    PT Next Visit Plan  continue to work on cervical/postural strengthening, manual therapy and modalities as needed for pain    Consulted and Agree with Plan of Care  Patient       Patient will benefit from skilled therapeutic intervention in order to improve the following deficits and impairments:  Decreased activity tolerance, Decreased balance, Decreased endurance, Decreased range of motion, Decreased strength, Postural dysfunction, Pain  Visit Diagnosis: Cervicalgia  Abnormal posture     Problem List Patient Active Problem List   Diagnosis Date Noted  . BPH (benign prostatic hypertrophy) with urinary retention 09/23/2013   Joshua Browning, SPTA  Joshua Browning 06/20/2017, 4:22 PM  [facility] [address] [place], [place], [address] Phone: [phone]   Fax:  [phone]  Name: Joshua Browning MRN: [id number] Date of Birth: July 16, 1946

## 2017-06-21 NOTE — Telephone Encounter (Signed)
Pt left a voicemail message saying he missed your call

## 2017-06-21 NOTE — Telephone Encounter (Signed)
Left message on machine for patient to call back.

## 2017-06-21 NOTE — Telephone Encounter (Signed)
Spoke with patient. He ate more this morning and did well with medication. He denied nausea meds at this time.

## 2017-06-22 ENCOUNTER — Encounter: Payer: Self-pay | Admitting: Physical Therapy

## 2017-06-22 ENCOUNTER — Ambulatory Visit: Payer: Medicare Other | Admitting: Physical Therapy

## 2017-06-22 DIAGNOSIS — M542 Cervicalgia: Secondary | ICD-10-CM

## 2017-06-22 DIAGNOSIS — R293 Abnormal posture: Secondary | ICD-10-CM

## 2017-06-22 NOTE — Therapy (Addendum)
[facility] [address] [place], [place], [address] Phone: [phone]   Fax:  [phone]  Physical Therapy Treatment  Patient Details  Name: Joshua Browning MRN: [id number] Date of Birth: 1945-08-21 Referring Provider: Alonza Bogus, MD   Encounter Date: 06/22/2017  PT End of Session - 06/22/17 1535    Visit Number  9    Number of Visits  9    Date for PT Re-Evaluation  07/04/17    Authorization Type  [employer] Medicare G-code & progress note every 10 visits.     PT Start Time  1530    PT Stop Time  1620    PT Time Calculation (min)  50 min    Activity Tolerance  Patient tolerated treatment well    Behavior During Therapy  WFL for tasks assessed/performed       Past Medical History:  Diagnosis Date  . BPH (benign prostatic hyperplasia)   . Foley catheter in place   . Nocturnal leg cramps   . Urinary retention   . Wears glasses     Past Surgical History:  Procedure Laterality Date  . CATARACT EXTRACTION W/ INTRAOCULAR LENS  IMPLANT, BILATERAL Bilateral   . INGUINAL HERNIA REPAIR Bilateral RIGHT 1984/  LEFT 2005  . RE-DO RIGHT INGUINAL HERNIA REPAIR AND UMBILICAL HERNIA REPAIR  2001  . REMOVAL LEFT EYE INTRAOCULAR LENS AND REPLACEMENT   08-29-2013    There were no vitals filed for this visit.  Subjective Assessment - 06/22/17 1540    Subjective  No new complaints, no falls or pain to report. Pt reports no pain now but pain this morning 2/10 (every thursday) due to more strenuous activity on Thursdays at work.     Pertinent History  Parkinsonism    Patient Stated Goals  To get rid of neck pain or improve level of neck pain.     Currently in Pain?  No/denies         [facility] PT Assessment - 06/22/17 0001      AROM   AROM Assessment Site  Cervical    Cervical Flexion  40    Cervical Extension  60    Cervical - Right Side Bend  40    Cervical - Left Side Bend  35    Cervical - Right Rotation  65    Cervical  - Left Rotation  50      [facility] Adult PT Treatment/Exercise - 06/22/17 1700      Self-Care   Other Self-Care Comments   reviewed all current HEP issued to date for cervical pain and strengthening. Pt reported no issues with current HEP. Refer to pt edcuation for further self care.       PT Education - 06/22/17 1539    Education provided  Yes    Education Details  Pt educated on stretching and if needed, allowed use of tylenol on Thursdays when is pain in worse due to increased work activity on these days. Discussed possible ways to modify activity/work load to decrease tension/strain on upper body/neck and pt verbalized understanding. Pt inquired about attending "[place]" boxing exericise class. Adivsed against this as it most likely will aggrivate his neck/arms. Discussed the referrals he has for OT/PT to address Parkinson's related issues. Pt was unsure about seeing both due to work schedule and wanting to try the exercise class first. Advised against this due to potential of this class to increase neck and shoulder pain. Discussed that PT/OT could  address safe exercise options for him in regards to Parkinsons that will not further irriate his neck pain. Pt wished to discuss all of this in person with his MD (Dr. Carles Collet) before proceeding with anything. Pt advised that his current referrals for OT/PT/ST to address Parkinson's are still current. Kathrine Rine, OT was over to discuss OT screen results with pt during this time as well.                                           Person(s) Educated  Patient    Methods  Explanation    Comprehension  Verbalized understanding;Need further instruction       PT Short Term Goals - 06/22/17 1535      PT SHORT TERM GOAL #1   Title  Pt will be independent with HEP for improved balance, posture, functional mobility.  TARGET 08/14/16    Baseline  pt reports he has been doing exercises through the holidays    Time  4    Period  Weeks    Status  Achieved       PT SHORT TERM GOAL #2   Title  Pt will improve 5x sit<>stand to less than or equal to 12.5 seconds for improved transfer efficiency and safety.    Baseline  10.06 secs - 08-17-16    Time  4    Status  Achieved      PT SHORT TERM GOAL #3   Title  Pt will improve posterior step and release test to 2 steps or less with independent balance recovery, to demonstrate improved balance recovery in posterior direction.    Baseline  approx. 3-4 steps taken for balance recovery - 08-17-16    Time  4    Period  Weeks    Status  Not Met      PT SHORT TERM GOAL #4   Title  Pt will negotiate at least 4 steps with intermittent UE support, while carrying object in one hand; 5 of 5 trials no LOB, for improved stair negotiation.    Baseline  carried 2 kg ball up/down steps - 08-17-16    Time  4    Period  Weeks    Status  Achieved      PT SHORT TERM GOAL #5   Title  Pt will verbalize understanding of local Parkinson's disease related resources.    Baseline  Pt unsure of available resources - 08-17-16    Time  4    Status  On-going        PT Long Term Goals - 06/22/17 1535      PT LONG TERM GOAL #1   Title  Patient verbalizes and demonstrates understanding of HEP to address cervical & posture issues. (All LTGs Target Date: 07/04/17)    Baseline  11/8: Pt has no issues performing HEP but doesnt perform everyday.     Time  4    Period  Weeks    Status  Achieved      PT LONG TERM GOAL #2   Title  Patient reports cervical pain </= 2/10.     Baseline  11/8: Pt reports no pain at the time of this session.     Time  4    Period  Weeks    Status  Achieved      PT LONG TERM GOAL #3   Title  Patient's cervical range improves to flexion 50*, extension 60*, lateral flexion 40* & rotation 60*    Baseline  2023/07/01: Ext: 65, Flexion: 40, L Lateral flex: 35,  R Lateral flex: 40, L Rotation:50 R lateral rotation: 65    Time  4    Period  Weeks    Status  Achieved      PT LONG TERM GOAL #4   Title  Patient  performs 5 times sit to/from stand <12 seconds.    Baseline  07-01-23: 11.59 secs/no UE support    Time  4    Period  Weeks    Status  Achieved            Plan - 30-Jun-2017 1643    Clinical Impression Statement  Todays session focused on long term goals. Pt achieved all LTG's and is set to discharge. Pt has a referral from his PCP for PT/OT regarding his Parkinsons diagnosis. Pt requests to speak with his doctor prior to scheduling an eval. Pt wanting to attend "rock steady" but was advised that because of his cervical issues the boxing would not be the best idea.     Rehab Potential  Good    PT Frequency  2x / week    PT Duration  4 weeks    PT Treatment/Interventions  Electrical Stimulation;Cryotherapy;Moist Heat;Traction;Ultrasound;DME Instruction;Gait training;Stair training;Functional mobility training;Therapeutic exercise;Therapeutic activities;Neuromuscular re-education;Balance training;Patient/family education    PT Next Visit Plan  Pt set to discharge this visit.     Consulted and Agree with Plan of Care  Patient      G-Codes - June 30, 2017 1700    Functional Assessment Tool Used (Outpatient Only)  Neck pain 0/10 currently, 2/10 at worst every Thursday morning. continues with postural deficits, however can verbalize ex's/stretches for home to address this.    Functional Limitation  Other PT primary      Patient will benefit from skilled therapeutic intervention in order to improve the following deficits and impairments:  Decreased activity tolerance, Decreased balance, Decreased endurance, Decreased range of motion, Decreased strength, Postural dysfunction, Pain  Visit Diagnosis: Cervicalgia  Abnormal posture     Problem List Patient Active Problem List   Diagnosis Date Noted  . BPH (benign prostatic hypertrophy) with urinary retention 09/23/2013   Alohilani Levenhagen, SPTA  Sharonna Vinje 2017/06/30, 4:48 PM  [facility] [address] [place], [place], [address]   Phone: [phone]   Fax:  [phone]  Name: Joshua Browning MRN: [id number] Date of Birth: 03/21/1946  This note has been reviewed and edited by supervising CI. Added additional details on treatment section, education provided and added in G-code section for PT to complete with discharge.  Willow Ora, PTA, [facility] [address], [place], [place] [address] [phone] 06/23/17, 8:55 AM     2017-06-30 1700  PT G-Codes  Functional Assessment Tool Used (Outpatient Only) Neck pain 0/10 currently, 2/10 at worst every Thursday morning. continues with postural deficits, however can verbalize ex's/stretches for home to address this.  Functional Limitation Other PT primary  Mobility: Walking and Moving Around Goal Status [phone]) CI  Mobility: Walking and Moving Around Discharge Status [phone]) CI    PHYSICAL THERAPY DISCHARGE SUMMARY  Visits from Start of Care: 9  Current functional level related to goals / functional outcomes: See above   Remaining deficits: See above   Education / Equipment: HEP  Plan: Patient agrees to discharge.  Patient goals were met.  Patient is being discharged due to meeting the stated rehab goals.  ?????          Jamey Reas, PT, DPT PT Specializing in Prosthetics & Orthotics 07/13/17 8:12 PM Phone:  [phone]  Fax:  [phone] [facility] [address] [place], [place] [address]

## 2017-07-03 ENCOUNTER — Ambulatory Visit: Payer: Medicare Other | Admitting: Physical Therapy

## 2017-07-04 ENCOUNTER — Ambulatory Visit: Payer: Medicare Other | Admitting: Physical Therapy

## 2017-07-25 ENCOUNTER — Ambulatory Visit: Payer: Medicare Other | Admitting: Neurology

## 2017-08-21 NOTE — Progress Notes (Signed)
Joshua Browning was seen today in the movement disorders clinic for neurologic consultation at the request of Lawerance Cruel, MD.  The consultation is for the evaluation of tremor and to r/o PD.  This patient is accompanied in the office by his spouse who supplements the history.  Tremor began about 5 years ago and involves both hands.  Tremor is most evident when he is picking up a fork or pencil or trying to hold a book.  He is R hand dominant but both hands seem to shake equally.  He thinks that it has changed/increased a little over the years.  05/30/16 update:  Pt f/u today, accompanied by his wife who supplements the history.  He had an MRI of the brain since last visit that I did review. This was done on 12/08/15.  There was at least moderate small vessel disease.  No falls since our last visit.  His balance has been worse.  He feels that he wants to fall backwards, especially on the stairs.  Notes some tremor, worse with anxiety.  Both at rest and with holding something.  Was supposed to have neuropsych testing since last visit but when called to schedule it, he declined it.  States that he was confused as to why we wanted to schedule it.    10/04/16 update:  Pt f/u today, accompanied by his wife who supplements the history.  He had neuropsych testing with Dr. Si Raider on 07/19/2016.  He has already followed up with her for the results.  Results were largely normal, with some evidence of mild cognitive impairment.  There was not evidence of a neurodegenerative dementia.  Dr. Si Raider stated that cognitive behavioral therapy could be of value.  He has been attending PT lately and I have reviewed those records.   Pt states that it really helped and he learned a lot of techniques for getting up out of cars, etc easily.  He has completed PT now.  He has had no falls.  He has had a few times where he was trying to get up off the couch and he would fall back into it.  His tremor is worse in the R hand than  the L.  Not spilling anything.  Morning is worse for tremor than afternoon  04/25/17 update:  Pt seen in f/u for tremor.  Reports that tremor has been a little worse.  Noting a little occasional jerking/twising of the right arm.  Happens when doing things like holding steering wheel.   Pt denies falls but balance is a little worse and has near falls.  When gets up, he will sometimes fall back into the couch.  Legs feel a little weak.  Pt denies lightheadedness, near syncope.  No hallucinations.  Mood has been good.  Reviewed his ophth notes since last visit.  He will return there for f/u in one year.  Also c/o "dull pain" on L side of neck.  States that he was told by PCP to talk ibuprofen and he has but it is not getting better.  He is now having more sharp, shooting pains on top of that dull pain.  Not worse to move the neck.  Also c/o terrible cramping in the hands and is so bad that has to stop eating and put down the fork.  08/22/17 update: Patient is seen today in follow-up for early Parkinson's disease.  He was started on levodopa last visit.  He was having some nausea with medication  despite eating with it and we gave him a small prescription for Zofran.  He states today that he never got that but he is taking it with bread and a slice of jelly and he is doing better.  He definitely notices that levodopa helps with the tremor.  He notices that when it wears off, he will start to feel wiggly, which is independent of tremor.  He notices that when he takes the levodopa, that will go away.  He can tell when the medication is wearing off.  He is still having hand cramping but those are mostly related to starting cars and squeezing the breaks.  He doesn't have those when he doesn't work on the weekends.  He does have nighttime leg cramps. He was complaining about neck pain last visit.  An MRI of the cervical spine was completed.  This demonstrated C5-C6 degenerative changes with facet edema.  There was also  multilevel neuroforaminal stenosis and moderate to severe left C3-C4, moderate left C4-C5 and moderate left C5-C6 neural foraminal stenosis.  He is finding that he is a bit wiggly in the mid afternoon.   We offered physical therapy and if that was not helpful to see a neurosurgeon.  He initially did not want anything, but ultimately called back for physical therapy.  He states that his neck is doing better.  All of the therapy is a little overwhelming with his job but he tries to do his job at home.  Wife states that he is depressed about this but patient denies this.  Wife states that falling on sleep a lot.  However, used to walk the dog a lot and enjoyed that but dog died.  Found that he is more deconditioned now that he is no longer exercising.  He is going to [facility].     ALLERGIES:  No Known Allergies  CURRENT MEDICATIONS:  Outpatient Encounter Medications as of 08/22/2017  Medication Sig  . b complex vitamins tablet Take 1 tablet by mouth daily.  . carbidopa-levodopa (SINEMET IR) 25-100 MG tablet Take 1 tablet by mouth 3 (three) times daily.  . Cholecalciferol (VITAMIN D3) 1000 UNITS CAPS Take 1 capsule by mouth every evening.  . Glucosamine Sulfate 1000 MG CAPS Take by mouth 2 (two) times daily.  . Magnesium 100 MG CAPS Take 1 capsule by mouth every evening.  . Multiple Vitamins-Minerals (VITABASIC SENIOR PO) Take 1 tablet by mouth.  . ranitidine (ZANTAC) 150 MG tablet Take 150 mg by mouth 2 (two) times daily.  . [DISCONTINUED] fluorouracil (EFUDEX) 5 % cream Apply topically 2 (two) times daily.  . [DISCONTINUED] vitamin B-12 (CYANOCOBALAMIN) 1000 MCG tablet Take 1,000 mcg by mouth every evening.   No facility-administered encounter medications on file as of 08/22/2017.     PAST MEDICAL HISTORY:   Past Medical History:  Diagnosis Date  . BPH (benign prostatic hyperplasia)   . Foley catheter in place   . Nocturnal leg cramps   . Urinary retention   . Wears glasses     PAST SURGICAL  HISTORY:   Past Surgical History:  Procedure Laterality Date  . CATARACT EXTRACTION W/ INTRAOCULAR LENS  IMPLANT, BILATERAL Bilateral   . INGUINAL HERNIA REPAIR Bilateral RIGHT 1984/  LEFT 2005  . RE-DO RIGHT INGUINAL HERNIA REPAIR AND UMBILICAL HERNIA REPAIR  2001  . REMOVAL LEFT EYE INTRAOCULAR LENS AND REPLACEMENT   08-29-2013  . TRANSURETHRAL RESECTION OF PROSTATE N/A 09/23/2013   Procedure: TRANSURETHRAL RESECTION OF THE PROSTATE (TURP) WITH GYRUS;  Surgeon: Claybon Jabs, MD;  Location: [facility];  Service: Urology;  Laterality: N/A;    SOCIAL HISTORY:   Social History   Socioeconomic History  . Marital status: Married    Spouse name: Not on file  . Number of children: Not on file  . Years of education: Not on file  . Highest education level: Not on file  Social Needs  . Financial resource strain: Not on file  . Food insecurity - worry: Not on file  . Food insecurity - inability: Not on file  . Transportation needs - medical: Not on file  . Transportation needs - non-medical: Not on file  Occupational History  . Occupation: Chief Financial Officer    Comment: did Chief Financial Officer and IT work; now works at [employer]  . Smoking status: Never Smoker  . Smokeless tobacco: Never Used  Substance and Sexual Activity  . Alcohol use: No    Alcohol/week: 0.0 oz  . Drug use: No  . Sexual activity: Not on file  Other Topics Concern  . Not on file  Social History Narrative  . Not on file    FAMILY HISTORY:   Family Status  Relation Name Status  . Mother  Deceased       CHF  . Father  Deceased       DM, heart attack  . Son  Alive       healthy  . Son  Alive       healthy  . Son  Alive       healthy    ROS:  A complete 10 system review of systems was obtained and was unremarkable apart from what is mentioned above.  PHYSICAL EXAMINATION:    VITALS:   Vitals:   08/22/17 1357  BP: 128/72  Pulse: 88  SpO2: 98%  Weight: 183 lb (83 kg)  Height: _0   (1.778 m)    GEN:  The patient appears stated age and is in NAD. HEENT:  Normocephalic, atraumatic.  The mucous membranes are moist. The superficial temporal arteries are without ropiness or tenderness. CV:  RRR Lungs:  CTAB Neck/HEME:  There are no carotid bruits bilaterally.  Neurological examination:  Orientation: The patient is A&O x 3 Cranial nerves: There is good facial symmetry. There is mild facial hypomimia.  Pupil on the L is slightly irregular from prior surgery.  .The speech is fluent and clear.  He does have some occasional word finding trouble.  He is mildly hypophonic.   Soft palate rises symmetrically and there is no tongue deviation. Hearing is intact to conversational tone. Sensation: Sensation is intact to light touch throughout. Motor: Strength is 5/5 in the bilateral upper and lower extremities.   Shoulder shrug is equal and symmetric.  There is no pronator drift.  Movement examination: Tone: There is normal tone in the upper and lower extremities. Abnormal movements: There is no resting tremor noted today.  I could feel a slight resting tremor, but cannot see it, in the right upper extremity. Coordination:  There is decremation only with hand opening and closing on the L.   Gait and Station: The patient has no difficulty arising out of the chair today.  He walks well down the hall.  Labs:   Lab Results  Component Value Date   TSH 1.11 11/30/2015   Lab Results  Component Value Date   [id number] 944 11/30/2015   Lab Results  Component Value Date   FOLATE 18.8  11/30/2015     ASSESSMENT/PLAN:  1.  Idiopathic Parkinson's disease  -He will continue carbidopa/levodopa 25/100, 1 tablet 3 times per day.  -discussed addition of carbidopa/levodopa 50/200 to help cramping at night.  He would like to hold on that.  -It sounds like he is having off dyskinesia.  He feels wiggly when medication wearing off and better once he takes the medication.  I told him he could  take an extra half to 1 tablet as needed throughout the day. 2.  Gait instability  -I think that this is multifactorial.    -encouraged exercise safely.  He has gotten out of the habit of exercise and really needs to return.  He states that he is planning on going to rock steady boxing. 3.  Cerebral small vessel disease  -if able and no hx of GI bleed, recommend ASA, 81 mg EC daily 4.  Neck pain  -Cervical spine MRI demonstrated evidence of neural foraminal stenosis.  Physical therapy has helped. 5.  Memory change  -He had neuropsych testing with Dr. Si Raider on 07/19/2016.  Results were largely normal, with some evidence of mild cognitive impairment.  6. Mild depression  -I think that this may be adjustment to the dx of PD and the fact that he really is not exercising, due to work and due to the fact that his dog died and he is no longer having to walk it.  Pt is not excited to take medication for mood but was agreeable at his wifes suggestion.    -will try remeron, 15 mg.  Risks, benefits, side effects and alternative therapies were discussed.  The opportunity to ask questions was given and they were answered to the best of my ability.  The patient expressed understanding and willingness to follow the outlined treatment protocols.  -met with our PD social worker today 7.  Follow up is anticipated in the next few months, sooner should new neurologic issues arise.  Much greater than 50% of this visit was spent in counseling and coordinating care.  Total face to face time:  40 min

## 2017-08-22 ENCOUNTER — Encounter: Payer: Self-pay | Admitting: Neurology

## 2017-08-22 ENCOUNTER — Ambulatory Visit: Payer: Medicare Other | Admitting: Neurology

## 2017-08-22 VITALS — BP 128/72 | HR 88 | Ht 70.0 in | Wt 183.0 lb

## 2017-08-22 DIAGNOSIS — G20 Parkinson's disease: Secondary | ICD-10-CM

## 2017-08-22 DIAGNOSIS — F329 Major depressive disorder, single episode, unspecified: Secondary | ICD-10-CM | POA: Diagnosis not present

## 2017-08-22 DIAGNOSIS — G249 Dystonia, unspecified: Secondary | ICD-10-CM

## 2017-08-22 MED ORDER — MIRTAZAPINE 15 MG PO TABS: 15.0000 mg | ORAL_TABLET | Freq: Every day | ORAL | 1 refills | Status: DC

## 2017-10-20 ENCOUNTER — Other Ambulatory Visit: Payer: Self-pay | Admitting: Neurology

## 2017-10-20 MED ORDER — CARBIDOPA-LEVODOPA 25-100 MG PO TABS: 1.0000 | ORAL_TABLET | Freq: Three times a day (TID) | ORAL | 1 refills | Status: DC

## 2017-11-06 ENCOUNTER — Other Ambulatory Visit (HOSPITAL_BASED_OUTPATIENT_CLINIC_OR_DEPARTMENT_OTHER): Payer: Self-pay | Admitting: Family Medicine

## 2017-11-06 ENCOUNTER — Ambulatory Visit (HOSPITAL_BASED_OUTPATIENT_CLINIC_OR_DEPARTMENT_OTHER)
Admission: RE | Admit: 2017-11-06 | Discharge: 2017-11-06 | Disposition: A | Discharge status: Home / Self Care | Payer: Medicare Other | Source: Ambulatory Visit | Attending: Family Medicine | Admitting: Family Medicine

## 2017-11-06 DIAGNOSIS — M7989 Other specified soft tissue disorders: Principal | ICD-10-CM

## 2017-11-06 DIAGNOSIS — R590 Localized enlarged lymph nodes: Secondary | ICD-10-CM | POA: Insufficient documentation

## 2017-11-06 DIAGNOSIS — M79601 Pain in right arm: Secondary | ICD-10-CM

## 2017-11-24 NOTE — Progress Notes (Signed)
Joshua Browning was seen today in the movement disorders clinic for neurologic consultation at the request of Lawerance Cruel, MD.  The consultation is for the evaluation of tremor and to r/o PD.  This patient is accompanied in the office by his spouse who supplements the history.  Tremor began about 5 years ago and involves both hands.  Tremor is most evident when he is picking up a fork or pencil or trying to hold a book.  He is R hand dominant but both hands seem to shake equally.  He thinks that it has changed/increased a little over the years.  05/30/16 update:  Pt f/u today, accompanied by his wife who supplements the history.  He had an MRI of the brain since last visit that I did review. This was done on 12/08/15.  There was at least moderate small vessel disease.  No falls since our last visit.  His balance has been worse.  He feels that he wants to fall backwards, especially on the stairs.  Notes some tremor, worse with anxiety.  Both at rest and with holding something.  Was supposed to have neuropsych testing since last visit but when called to schedule it, he declined it.  States that he was confused as to why we wanted to schedule it.    10/04/16 update:  Pt f/u today, accompanied by his wife who supplements the history.  He had neuropsych testing with Dr. Si Raider on 07/19/2016.  He has already followed up with her for the results.  Results were largely normal, with some evidence of mild cognitive impairment.  There was not evidence of a neurodegenerative dementia.  Dr. Si Raider stated that cognitive behavioral therapy could be of value.  He has been attending PT lately and I have reviewed those records.   Pt states that it really helped and he learned a lot of techniques for getting up out of cars, etc easily.  He has completed PT now.  He has had no falls.  He has had a few times where he was trying to get up off the couch and he would fall back into it.  His tremor is worse in the R hand than  the L.  Not spilling anything.  Morning is worse for tremor than afternoon  04/25/17 update:  Pt seen in f/u for tremor.  Reports that tremor has been a little worse.  Noting a little occasional jerking/twising of the right arm.  Happens when doing things like holding steering wheel.   Pt denies falls but balance is a little worse and has near falls.  When gets up, he will sometimes fall back into the couch.  Legs feel a little weak.  Pt denies lightheadedness, near syncope.  No hallucinations.  Mood has been good.  Reviewed his ophth notes since last visit.  He will return there for f/u in one year.  Also c/o "dull pain" on L side of neck.  States that he was told by PCP to talk ibuprofen and he has but it is not getting better.  He is now having more sharp, shooting pains on top of that dull pain.  Not worse to move the neck.  Also c/o terrible cramping in the hands and is so bad that has to stop eating and put down the fork.  08/22/17 update: Patient is seen today in follow-up for early Parkinson's disease.  He was started on levodopa last visit.  He was having some nausea with medication  despite eating with it and we gave him a small prescription for Zofran.  He states today that he never got that but he is taking it with bread and a slice of jelly and he is doing better.  He definitely notices that levodopa helps with the tremor.  He notices that when it wears off, he will start to feel wiggly, which is independent of tremor.  He notices that when he takes the levodopa, that will go away.  He can tell when the medication is wearing off.  He is still having hand cramping but those are mostly related to starting cars and squeezing the breaks.  He doesn't have those when he doesn't work on the weekends.  He does have nighttime leg cramps. He was complaining about neck pain last visit.  An MRI of the cervical spine was completed.  This demonstrated C5-C6 degenerative changes with facet edema.  There was also  multilevel neuroforaminal stenosis and moderate to severe left C3-C4, moderate left C4-C5 and moderate left C5-C6 neural foraminal stenosis.  He is finding that he is a bit wiggly in the mid afternoon.   We offered physical therapy and if that was not helpful to see a neurosurgeon.  He initially did not want anything, but ultimately called back for physical therapy.  He states that his neck is doing better.  All of the therapy is a little overwhelming with his job but he tries to do his job at home.  Wife states that he is depressed about this but patient denies this.  Wife states that falling on sleep a lot.  However, used to walk the dog a lot and enjoyed that but dog died.  Found that he is more deconditioned now that he is no longer exercising.  He is going to [employer].    11/28/17 update: Patient is seen today in follow-up for Parkinson's disease. This patient is accompanied in the office by his spouse who supplements the history. He is on carbidopa/levodopa 25/100, 1 tablet 3 times per day.  Mirtazapine was started last visit for mild depression.  Reports today that he is sleeping better.  Mood has been "some better" but wife isn't convinced.  Mood depends mostly on the job.  Pt denies falls.  Pt denies lightheadedness, near syncope.  No hallucinations.   Did go to PT for neck pain.  He is doing one on one training at [employer].  He just started that and has only been there 2 times.  He would eventually like to get back to [facility].     ALLERGIES:  No Known Allergies  CURRENT MEDICATIONS:  Outpatient Encounter Medications as of 11/28/2017  Medication Sig  . b complex vitamins tablet Take 1 tablet by mouth daily.  . carbidopa-levodopa (SINEMET IR) 25-100 MG tablet Take 1 tablet by mouth 3 (three) times daily.  . Cholecalciferol (VITAMIN D3) 1000 UNITS CAPS Take 1 capsule by mouth every evening.  . Glucosamine Sulfate 1000 MG CAPS Take by mouth 2 (two) times daily.  . Magnesium 100 MG CAPS Take 1 capsule  by mouth every evening.  . mirtazapine (REMERON) 15 MG tablet Take 1 tablet (15 mg total) by mouth at bedtime.  . Multiple Vitamins-Minerals (VITABASIC SENIOR PO) Take 1 tablet by mouth.  . ranitidine (ZANTAC) 150 MG tablet Take 150 mg by mouth 2 (two) times daily.   No facility-administered encounter medications on file as of 11/28/2017.     PAST MEDICAL HISTORY:   Past Medical History:  Diagnosis Date  . BPH (benign prostatic hyperplasia)   . Foley catheter in place   . Nocturnal leg cramps   . Urinary retention   . Wears glasses     PAST SURGICAL HISTORY:   Past Surgical History:  Procedure Laterality Date  . CATARACT EXTRACTION W/ INTRAOCULAR LENS  IMPLANT, BILATERAL Bilateral   . INGUINAL HERNIA REPAIR Bilateral RIGHT 1984/  LEFT 2005  . RE-DO RIGHT INGUINAL HERNIA REPAIR AND UMBILICAL HERNIA REPAIR  2001  . REMOVAL LEFT EYE INTRAOCULAR LENS AND REPLACEMENT   08-29-2013  . TRANSURETHRAL RESECTION OF PROSTATE N/A 09/23/2013   Procedure: TRANSURETHRAL RESECTION OF THE PROSTATE (TURP) WITH GYRUS;  Surgeon: Claybon Jabs, MD;  Location: [facility];  Service: Urology;  Laterality: N/A;    SOCIAL HISTORY:   Social History   Socioeconomic History  . Marital status: Married    Spouse name: Not on file  . Number of children: Not on file  . Years of education: Not on file  . Highest education level: Not on file  Occupational History  . Occupation: Chief Financial Officer    Comment: did Chief Financial Officer and IT work; now works at [employer]  . Financial resource strain: Not on file  . Food insecurity:    Worry: Not on file    Inability: Not on file  . Transportation needs:    Medical: Not on file    Non-medical: Not on file  Tobacco Use  . Smoking status: Never Smoker  . Smokeless tobacco: Never Used  Substance and Sexual Activity  . Alcohol use: No    Alcohol/week: 0.0 oz  . Drug use: No  . Sexual activity: Not on file  Lifestyle  . Physical activity:     Days per week: Not on file    Minutes per session: Not on file  . Stress: Not on file  Relationships  . Social connections:    Talks on phone: Not on file    Gets together: Not on file    Attends religious service: Not on file    Active member of club or organization: Not on file    Attends meetings of clubs or organizations: Not on file    Relationship status: Not on file  . Intimate partner violence:    Fear of current or ex partner: Not on file    Emotionally abused: Not on file    Physically abused: Not on file    Forced sexual activity: Not on file  Other Topics Concern  . Not on file  Social History Narrative  . Not on file    FAMILY HISTORY:   Family Status  Relation Name Status  . Mother  Deceased       CHF  . Father  Deceased       DM, heart attack  . Son  Alive       healthy  . Son  Alive       healthy  . Son  Alive       healthy    ROS:  Review of Systems  Constitutional: Negative.   HENT: Negative.   Eyes: Negative.   Respiratory: Negative.   Cardiovascular: Negative.   Genitourinary: Negative.   Musculoskeletal: Negative.   Skin: Negative.   Neurological: Negative.   Endo/Heme/Allergies: Negative.   Psychiatric/Behavioral: Negative.     PHYSICAL EXAMINATION:    VITALS:   Vitals:   11/28/17 0800  BP: 130/88  Pulse: 64  SpO2: 95%  Weight: 183 lb (83 kg)  Height: 5\' 10"  (1.778 m)    GEN:  The patient appears stated age and is in NAD. HEENT:  Normocephalic, atraumatic.  The mucous membranes are moist. The superficial temporal arteries are without ropiness or tenderness. CV:  RRR Lungs:  CTAB Neck/HEME:  There are no bruits  Neurological examination:  Orientation: The patient is A&O x 3 Cranial nerves: There is good facial symmetry. There is mild facial hypomimia.  Pupil on the L is slightly irregular from prior surgery.  .The speech is fluent and clear.  He does have some occasional word finding trouble.  He is mildly hypophonic.   Soft  palate rises symmetrically and there is no tongue deviation. Hearing is intact to conversational tone. Sensation: Sensation is intact to light touch throughout Motor: Strength is 5/5 in the UE/LE  Movement examination: Tone: There is normal tone in the upper and lower extremities. Abnormal movements: There is mild RUE resting tremor that is slightly worse with ambulation. Coordination:  There is no decremation with any form of RAMS, including alternating supination and pronation of the forearm, hand opening and closing, finger taps, heel taps and toe taps.  Gait and Station: The patient has no difficulty arising out of the chair today.  He walks well down the hall.  He is stooped at the waist.  He has a positive pull test.  Labs:   Lab Results  Component Value Date   TSH 1.11 11/30/2015   Lab Results  Component Value Date   VITAMINB12 466 11/30/2015   Lab Results  Component Value Date   FOLATE 18.8 11/30/2015     ASSESSMENT/PLAN:  1.  Idiopathic Parkinson's disease  -He will continue carbidopa/levodopa 25/100 three times per day  -discussed addition of carbidopa/levodopa 50/200 to help cramping at night.  He states doing better in that regard since sleeping better  -he is going to see dermatology at [facility] dermatology.  He is going for full body scan.  Discussed relationship of PD to melanoma 2.  Gait instability  -I think that this is multifactorial.    -encouraged exercise safely.  He has gotten out of the habit of exercise and really needs to return.  He states that he is planning on going to rock steady boxing. 3.  Cerebral small vessel disease  -if able and no hx of GI bleed, recommend ASA, 81 mg EC daily 4.  Neck pain  -Cervical spine MRI demonstrated evidence of neural foraminal stenosis.  Physical therapy has helped. 5.  Memory change  -He had neuropsych testing with Dr. Si Raider on 07/19/2016.  Results were largely normal, with some evidence of mild cognitive impairment.    6. Mild depression  -I think that this may be adjustment to the dx of PD and the fact that he really is not exercising, due to work and due to the fact that his dog died and he is no longer having to walk it.  Pt is not excited to take medication for mood but was agreeable at his wifes suggestion.    -he is on remeron, 15 mg.  He thinks that he is doing better but wife isn't sure.  Most seems work related.   7.  Follow up is anticipated in the next few months, sooner should new neurologic issues arise.  Much greater than 50% of this visit was spent in counseling and coordinating care.  Total face to face time:  25 min

## 2017-11-28 ENCOUNTER — Encounter: Payer: Self-pay | Admitting: Neurology

## 2017-11-28 ENCOUNTER — Ambulatory Visit: Payer: Medicare Other | Admitting: Neurology

## 2017-11-28 VITALS — BP 130/88 | HR 64 | Ht 70.0 in | Wt 183.0 lb

## 2017-11-28 DIAGNOSIS — G20 Parkinson's disease: Secondary | ICD-10-CM

## 2017-11-28 DIAGNOSIS — F330 Major depressive disorder, recurrent, mild: Secondary | ICD-10-CM | POA: Diagnosis not present

## 2017-11-28 NOTE — Patient Instructions (Signed)
  [facility]  The [facility] team know that living well with a movement disorder extends far beyond our clinic walls. We are together with you. Our team is passionate about providing resources to you and your loved ones who are living with Parkinson's disease and movement disorders. Participate in these programs and join our community. These resources are free or low cost!   [facility] is adding:   Innovative educational programs for patients and caregivers.   Support groups for patients and caregivers living with Parkinson's disease.   Parkinson's specific exercise programs.   Custom tailored therapeutic programs that will benefit patient's living with Parkinson's disease.   We are in this together. You can help and contribute to grow these programs and resources in our community. 100% of the funds donated to the Movement Disorders Fund stays right here in our community to support patients and their caregivers.  To make a tax deductible contribution:  -ask for a [facility]'s envelope in the office today.  - call the Office of Institutional Advancement at [phone].         

## 2018-02-12 ENCOUNTER — Other Ambulatory Visit: Payer: Self-pay | Admitting: Neurology

## 2018-04-06 NOTE — Progress Notes (Signed)
Joshua Browning was seen today in the movement disorders clinic for neurologic consultation at the request of Lawerance Cruel, MD.  The consultation is for the evaluation of tremor and to r/o PD.  This patient is accompanied in the office by his spouse who supplements the history.  Tremor began about 5 years ago and involves both hands.  Tremor is most evident when he is picking up a fork or pencil or trying to hold a book.  He is R hand dominant but both hands seem to shake equally.  He thinks that it has changed/increased a little over the years.  05/30/16 update:  Pt f/u today, accompanied by his wife who supplements the history.  He had an MRI of the brain since last visit that I did review. This was done on 12/08/15.  There was at least moderate small vessel disease.  No falls since our last visit.  His balance has been worse.  He feels that he wants to fall backwards, especially on the stairs.  Notes some tremor, worse with anxiety.  Both at rest and with holding something.  Was supposed to have neuropsych testing since last visit but when called to schedule it, he declined it.  States that he was confused as to why we wanted to schedule it.    10/04/16 update:  Pt f/u today, accompanied by his wife who supplements the history.  He had neuropsych testing with Dr. Si Raider on 07/19/2016.  He has already followed up with her for the results.  Results were largely normal, with some evidence of mild cognitive impairment.  There was not evidence of a neurodegenerative dementia.  Dr. Si Raider stated that cognitive behavioral therapy could be of value.  He has been attending PT lately and I have reviewed those records.   Pt states that it really helped and he learned a lot of techniques for getting up out of cars, etc easily.  He has completed PT now.  He has had no falls.  He has had a few times where he was trying to get up off the couch and he would fall back into it.  His tremor is worse in the R hand than  the L.  Not spilling anything.  Morning is worse for tremor than afternoon  04/25/17 update:  Pt seen in f/u for tremor.  Reports that tremor has been a little worse.  Noting a little occasional jerking/twising of the right arm.  Happens when doing things like holding steering wheel.   Pt denies falls but balance is a little worse and has near falls.  When gets up, he will sometimes fall back into the couch.  Legs feel a little weak.  Pt denies lightheadedness, near syncope.  No hallucinations.  Mood has been good.  Reviewed his ophth notes since last visit.  He will return there for f/u in one year.  Also c/o "dull pain" on L side of neck.  States that he was told by PCP to talk ibuprofen and he has but it is not getting better.  He is now having more sharp, shooting pains on top of that dull pain.  Not worse to move the neck.  Also c/o terrible cramping in the hands and is so bad that has to stop eating and put down the fork.  08/22/17 update: Patient is seen today in follow-up for early Parkinson's disease.  He was started on levodopa last visit.  He was having some nausea with medication  despite eating with it and we gave him a small prescription for Zofran.  He states today that he never got that but he is taking it with bread and a slice of jelly and he is doing better.  He definitely notices that levodopa helps with the tremor.  He notices that when it wears off, he will start to feel wiggly, which is independent of tremor.  He notices that when he takes the levodopa, that will go away.  He can tell when the medication is wearing off.  He is still having hand cramping but those are mostly related to starting cars and squeezing the breaks.  He doesn't have those when he doesn't work on the weekends.  He does have nighttime leg cramps. He was complaining about neck pain last visit.  An MRI of the cervical spine was completed.  This demonstrated C5-C6 degenerative changes with facet edema.  There was also  multilevel neuroforaminal stenosis and moderate to severe left C3-C4, moderate left C4-C5 and moderate left C5-C6 neural foraminal stenosis.  He is finding that he is a bit wiggly in the mid afternoon.   We offered physical therapy and if that was not helpful to see a neurosurgeon.  He initially did not want anything, but ultimately called back for physical therapy.  He states that his neck is doing better.  All of the therapy is a little overwhelming with his job but he tries to do his job at home.  Wife states that he is depressed about this but patient denies this.  Wife states that falling on sleep a lot.  However, used to walk the dog a lot and enjoyed that but dog died.  Found that he is more deconditioned now that he is no longer exercising.  He is going to [employer].    11/28/17 update: Patient is seen today in follow-up for Parkinson's disease. This patient is accompanied in the office by his spouse who supplements the history. He is on carbidopa/levodopa 25/100, 1 tablet 3 times per day.  Mirtazapine was started last visit for mild depression.  Reports today that he is sleeping better.  Mood has been "some better" but wife isn't convinced.  Mood depends mostly on the job.  Pt denies falls.  Pt denies lightheadedness, near syncope.  No hallucinations.   Did go to PT for neck pain.  He is doing one on one training at [employer].  He just started that and has only been there 2 times.  He would eventually like to get back to [employer].    04/10/18 update: Patient is seen today in follow-up for Parkinson's disease.   Patient is on carbidopa/levodopa 25/100, 1 tablet 3 times per day.  His biggest issues he has is in the morning with tremulousness and stiffness.  Having cramping at night and RLS symptoms.  Overall, though, feels well.  He is still working at the [employer].   He enjoys that.  No problems with that.  No falls.  No lightheadedness or near syncope.  No hallucinations.  He is on mirtazapine for mood.  Saw  dermatology at [facility] dermatology since our last visit.  Had biopsy and was not melanoma.  Has another appt later this week.  Doing [employer] one day per week and doing personal trainer there one day a week.  Loves that.  ALLERGIES:  No Known Allergies  CURRENT MEDICATIONS:  Outpatient Encounter Medications as of 04/10/2018  Medication Sig  . b complex vitamins tablet Take  1 tablet by mouth daily.  . carbidopa-levodopa (SINEMET IR) 25-100 MG tablet Take 1 tablet by mouth 3 (three) times daily.  . Cholecalciferol (VITAMIN D3) 1000 UNITS CAPS Take 1 capsule by mouth every evening.  . Glucosamine Sulfate 1000 MG CAPS Take by mouth 2 (two) times daily.  . Magnesium 100 MG CAPS Take 1 capsule by mouth every evening.  . mirtazapine (REMERON) 15 MG tablet TAKE 1 TABLET BY MOUTH EVERYDAY AT BEDTIME  . Multiple Vitamins-Minerals (VITABASIC SENIOR PO) Take 1 tablet by mouth.  . ranitidine (ZANTAC) 150 MG tablet Take 150 mg by mouth 2 (two) times daily.  . [DISCONTINUED] carbidopa-levodopa (SINEMET IR) 25-100 MG tablet Take 1 tablet by mouth 3 (three) times daily.  . carbidopa-levodopa (SINEMET CR) 50-200 MG tablet Take 1 tablet by mouth at bedtime.   No facility-administered encounter medications on file as of 04/10/2018.     PAST MEDICAL HISTORY:   Past Medical History:  Diagnosis Date  . BPH (benign prostatic hyperplasia)   . Foley catheter in place   . Nocturnal leg cramps   . Urinary retention   . Wears glasses     PAST SURGICAL HISTORY:   Past Surgical History:  Procedure Laterality Date  . CATARACT EXTRACTION W/ INTRAOCULAR LENS  IMPLANT, BILATERAL Bilateral   . INGUINAL HERNIA REPAIR Bilateral RIGHT 1984/  LEFT 2005  . RE-DO RIGHT INGUINAL HERNIA REPAIR AND UMBILICAL HERNIA REPAIR  2001  . REMOVAL LEFT EYE INTRAOCULAR LENS AND REPLACEMENT   08-29-2013  . TRANSURETHRAL RESECTION OF PROSTATE N/A 09/23/2013   Procedure: TRANSURETHRAL RESECTION OF THE PROSTATE (TURP) WITH GYRUS;  Surgeon: Claybon Jabs, MD;  Location: [facility];  Service: Urology;  Laterality: N/A;    SOCIAL HISTORY:   Social History   Socioeconomic History  . Marital status: Married    Spouse name: Not on file  . Number of children: Not on file  . Years of education: Not on file  . Highest education level: Not on file  Occupational History  . Occupation: Chief Financial Officer    Comment: did Chief Financial Officer and IT work; now works at [employer]  . Financial resource strain: Not on file  . Food insecurity:    Worry: Not on file    Inability: Not on file  . Transportation needs:    Medical: Not on file    Non-medical: Not on file  Tobacco Use  . Smoking status: Never Smoker  . Smokeless tobacco: Never Used  Substance and Sexual Activity  . Alcohol use: No    Alcohol/week: 0.0 standard drinks  . Drug use: No  . Sexual activity: Not on file  Lifestyle  . Physical activity:    Days per week: Not on file    Minutes per session: Not on file  . Stress: Not on file  Relationships  . Social connections:    Talks on phone: Not on file    Gets together: Not on file    Attends religious service: Not on file    Active member of club or organization: Not on file    Attends meetings of clubs or organizations: Not on file    Relationship status: Not on file  . Intimate partner violence:    Fear of current or ex partner: Not on file    Emotionally abused: Not on file    Physically abused: Not on file    Forced sexual activity: Not on file  Other Topics Concern  .  Not on file  Social History Narrative  . Not on file    FAMILY HISTORY:   Family Status  Relation Name Status  . Mother  Deceased       CHF  . Father  Deceased       DM, heart attack  . Son  Alive       healthy  . Son  Alive       healthy  . Son  Alive       healthy    ROS:  Review of Systems  Constitutional: Negative.   HENT: Negative.   Eyes: Negative.   Cardiovascular: Negative.   Gastrointestinal: Negative.    Genitourinary: Negative.   Skin: Negative.   Neurological: Negative.   Endo/Heme/Allergies: Negative.     PHYSICAL EXAMINATION:    VITALS:   Vitals:   04/10/18 0801  BP: 138/74  Pulse: 78  SpO2: 98%  Weight: 180 lb 2 oz (81.7 kg)  Height: 5\' 10"  (1.778 m)    GEN:  The patient appears stated age and is in NAD. HEENT:  Normocephalic, atraumatic.  The mucous membranes are moist. The superficial temporal arteries are without ropiness or tenderness. CV:  RRR Lungs:  CTAB Neck/HEME:  There are no carotid bruits bilaterally.  Neurological examination:  Orientation: The patient is alert and oriented x3. Cranial nerves: There is good facial symmetry. The speech is fluent and clear. Soft palate rises symmetrically and there is no tongue deviation. Hearing is intact to conversational tone. Sensation: Sensation is intact to light touch throughout Motor: Strength is 5/5 in the bilateral upper and lower extremities.   Shoulder shrug is equal and symmetric.  There is no pronator drift.  Movement examination: Tone: There is normal tone in the upper and lower extremities. Abnormal movements: There is rare and mild RUE resting tremor Coordination:  There is mild decremation with finger taps on the right.  All other RAMs are normal Gait and Station: The patient arises out of the chair without hands on 2nd attempt.  He walks well down the hall.  He is stooped at the waist.  He has a positive pull test.  Labs:   Lab Results  Component Value Date   TSH 1.11 11/30/2015   Lab Results  Component Value Date   VITAMINB12 466 11/30/2015   Lab Results  Component Value Date   FOLATE 18.8 11/30/2015     ASSESSMENT/PLAN:  1.  Idiopathic Parkinson's disease  -He will continue carbidopa/levodopa 25/100 three times per day  -having some trouble with first morning on.  Having cramping in hands and feet at night as well as RLS.  Will try carbidopa/levodopa 50/200 at bedtime   -he is following  with dermatology  -invited to PARTS program 2.  Gait instability  -congratulated him on going back to [employer] 3.  Cerebral small vessel disease  -if able and no hx of GI bleed, recommend ASA, 81 mg EC daily 4.  Neck pain  -Cervical spine MRI demonstrated evidence of neural foraminal stenosis.  Physical therapy has helped. 5.  Memory change  -He had neuropsych testing with Dr. Si Raider on 07/19/2016.  Results were largely normal, with some evidence of mild cognitive impairment. Having more word finding issues and may need to repeat in the future.  Decided to hold off for now as wife not here today to help with hx 6. Mild depression  -better with remeron 15 mg q hs.  Risks, benefits, side effects and alternative therapies were  discussed.  The opportunity to ask questions was given and they were answered to the best of my ability.  The patient expressed understanding and willingness to follow the outlined treatment protocols. 7.  Follow up is anticipated in the next few months, sooner should new neurologic issues arise.   Much greater than 50% of this visit was spent in counseling and coordinating care.  Total face to face time:  25 min

## 2018-04-10 ENCOUNTER — Ambulatory Visit: Payer: Medicare Other | Admitting: Neurology

## 2018-04-10 ENCOUNTER — Encounter: Payer: Self-pay | Admitting: Neurology

## 2018-04-10 VITALS — BP 138/74 | HR 78 | Ht 70.0 in | Wt 180.1 lb

## 2018-04-10 DIAGNOSIS — G2581 Restless legs syndrome: Secondary | ICD-10-CM

## 2018-04-10 DIAGNOSIS — G20 Parkinson's disease: Secondary | ICD-10-CM

## 2018-04-10 DIAGNOSIS — F330 Major depressive disorder, recurrent, mild: Secondary | ICD-10-CM | POA: Diagnosis not present

## 2018-04-10 MED ORDER — CARBIDOPA-LEVODOPA 25-100 MG PO TABS: 1.0000 | ORAL_TABLET | Freq: Three times a day (TID) | ORAL | 1 refills | Status: DC

## 2018-04-10 MED ORDER — CARBIDOPA-LEVODOPA ER 50-200 MG PO TBCR: 1.0000 | EXTENDED_RELEASE_TABLET | Freq: Every day | ORAL | 1 refills | Status: DC

## 2018-04-10 NOTE — Patient Instructions (Addendum)
Continue carbidopa/levodopa 25/100, one tablet at 6am/10-11am/4pm  Start carbidopa/levodopa 50/200 at bedtime

## 2018-06-15 ENCOUNTER — Other Ambulatory Visit: Payer: Self-pay | Admitting: Urology

## 2018-06-15 DIAGNOSIS — I861 Scrotal varices: Secondary | ICD-10-CM

## 2018-06-26 ENCOUNTER — Other Ambulatory Visit: Payer: Self-pay

## 2018-06-26 ENCOUNTER — Encounter (HOSPITAL_BASED_OUTPATIENT_CLINIC_OR_DEPARTMENT_OTHER): Payer: Self-pay | Admitting: *Deleted

## 2018-06-26 NOTE — Progress Notes (Addendum)
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

## 2018-06-30 NOTE — H&P (Signed)
HPI: Joshua Browning is a 72 year-old male patient with a symptomatic Lt. variocele.  He first noticed his varicocele 8 weeks ago. His varicocele is on the left side.   His varicocele does resolve when he is lying down. He does have testicular pain on the side of his varicocele. He does have pain on the same side as his varicocele when he lifts, strains, or coughs.   He has not had a prior varicocele repair surgery on the same side as his current varicocele. His varicocele does bother him enough to consider surgical repair.   06/07/18: He was recently found on exam on 05/17/18 to have a possible left varicocele versus other scrotal swelling. He was sent for further evaluation.  He reports to me that for 6-8 weeks he has had pain in the left hemiscrotum. He works at the [employer] and also does callus that aches as well as box thing to keep him mobile because of his Parkinson's disease. He has had bilateral inguinal hernias in the past. He said he feels the swelling in his scrotum that he describes as a "bundle of wires"and says it seems to be worse in the daytime and then worsened when he is getting in and out of cars at his job. It goes away at night. Fertility is obviously not an issue.     ALLERGIES: No Allergies    MEDICATIONS: B Complex  Glucosamine Sulfate 1,000 mg capsule  Magnesium Oxide 400 mg tablet Oral  Multi-Day TABS Oral  PriLOSEC 20 MG Oral Capsule Delayed Release Oral  Remeron 15 mg tablet  Sinemet Cr 50 mg-200 mg tablet, extended release  Vitabascic Senior  Vitamin D TABS Oral  Zantac 150 mg tablet     GU PSH: Cystoscopy TURP - 2015      PSH Notes: Transurethral Resection Of Prostate (TURP), Eye Surgery, Cataract Surgery, Inguinal Hernia Repair, Inguinal Hernia Repair   NON-GU PSH: Hernia Repair, Bilateral Laparoscopy, Surgical; Repair Umbilical Hernia    GU PMH: Renal cyst, Parapelvic renal cyst - 2015, Renal cyst, left, - 2015 Elevated PSA, Elevated prostate  specific antigen (PSA) - 2015 Hydronephrosis Unspec, Hydronephrosis, right - 65 Male ED, unspecified, Erectile dysfunction - 2015 Prostate nodule w/o LUTS, Nodular prostate without lower urinary tract symptoms - 2014    NON-GU PMH: Encounter for general adult medical examination without abnormal findings, Encounter for preventive health examination - 2015 Personal history of other diseases of the digestive system, History of esophageal reflux - 2014    FAMILY HISTORY: 3 Son's - Son Diabetes - Runs In Family Heart Disease - Runs In Family No pertinent family history - Runs In Family   SOCIAL HISTORY: Marital Status: Married Preferred Language: English; Ethnicity: Not Hispanic Or Latino; Race: White Current Smoking Status: Patient has never smoked.   Tobacco Use Assessment Completed: Used Tobacco in last 30 days? Has never drank.  Drinks 3 caffeinated drinks per day.     Notes: Never A Smoker, Marital History - Currently Married, Caffeine Use, Alcohol Use, Occupation: Retired   REVIEW OF SYSTEMS:    GU Review Male:   Patient reports get up at night to urinate. Patient denies frequent urination, hard to postpone urination, burning/ pain with urination, leakage of urine, stream starts and stops, trouble starting your stream, have to strain to urinate , erection problems, and penile pain.  Gastrointestinal (Upper):   Patient denies nausea, vomiting, and indigestion/ heartburn.  Gastrointestinal (Lower):   Patient denies diarrhea and constipation.  Constitutional:  Patient denies fever, night sweats, weight loss, and fatigue.  Skin:   Patient denies skin rash/ lesion and itching.  Eyes:   Patient denies blurred vision and double vision.  Ears/ Nose/ Throat:   Patient denies sore throat and sinus problems.  Hematologic/Lymphatic:   Patient denies swollen glands and easy bruising.  Cardiovascular:   Patient denies leg swelling and chest pains.  Respiratory:   Patient denies cough and  shortness of breath.  Endocrine:   Patient denies excessive thirst.  Musculoskeletal:   Patient denies back pain and joint pain.  Neurological:   Patient denies headaches and dizziness.  Psychologic:   Patient denies depression and anxiety.   VITAL SIGNS:   Weight 175 lb / 79.38 kg  Height 70 in / 177.8 cm  BP 131/84 mmHg  Pulse 77 /min  BMI 25.1 kg/m   GU PHYSICAL EXAMINATION:    Anus and Perineum: No hemorrhoids. No anal stenosis. No rectal fissure, no anal fissure. No edema, no dimple, no perineal tenderness, no anal tenderness.  Scrotum: No lesions. No edema. No cysts. No warts.  Epididymides: Right: no spermatocele, no masses, no cysts, no tenderness, no induration, no enlargement. Left: no spermatocele, no masses, no cysts, no tenderness, no induration, no enlargement.  Testes: No tenderness, no swelling, no enlargement left testes. No tenderness, no swelling, no enlargement right testes. Normal location left testes. Normal location right testes. No mass, no cyst, no varicocele, no hydrocele left testes. No mass, no cyst, no varicocele, no hydrocele right testes.  Urethral Meatus: Normal size. No lesion, no wart, no discharge, no polyp. Normal location.  Penis: Circumcised, no warts, no cracks. No dorsal Peyronie's plaques, no left corporal Peyronie's plaques, no right corporal Peyronie's plaques, no scarring, no warts. No balanitis, no meatal stenosis.  Prostate: 40 gram or 2+ size. Left lobe normal consistency, right lobe normal consistency. Symmetrical lobes. No prostate nodule. Left lobe no tenderness, right lobe no tenderness.  Seminal Vesicles: Nonpalpable.  Sphincter Tone: Normal sphincter. No rectal tenderness. No rectal mass.    MULTI-SYSTEM PHYSICAL EXAMINATION:    Constitutional: Well-nourished. No physical deformities. Normally developed. Good grooming.  Neck: Neck symmetrical, not swollen. Normal tracheal position.  Respiratory: No labored breathing, no use of accessory  muscles.   Cardiovascular: Normal temperature, normal extremity pulses, no swelling, no varicosities.  Lymphatic: No enlargement of neck, axillae, groin.  Skin: No paleness, no jaundice, no cyanosis. No lesion, no ulcer, no rash.  Neurologic / Psychiatric: Oriented to time, oriented to place, oriented to person. No depression, no anxiety, no agitation.  Gastrointestinal: No mass, no tenderness, no rigidity, non obese abdomen.  Eyes: Normal conjunctivae. Normal eyelids.  Ears, Nose, Mouth, and Throat: Left ear no scars, no lesions, no masses. Right ear no scars, no lesions, no masses. Nose no scars, no lesions, no masses. Normal hearing. Normal lips.  Musculoskeletal: Normal gait and station of head and neck.     PAST DATA REVIEWED:  Source Of History:  Patient  Lab Test Review:   PSA, BUN/Creatinine  Records Review:   Previous Doctor Records   12/19/17 11/15/16 10/27/15 08/13/13 08/06/12 01/17/12  PSA  Total PSA 2.75 ng/dl 2.52 ng/dl 2.14 ng/dl 1.62  2.55  2.35    Notes:                     His creatinine was 0.94 in 7/18.   PROCEDURES: None   ASSESSMENT/PLAN:      ICD-10 Details  1  GU:   Varicocele - I86.1 Acute - I have given him information on the treatment options and he is going to consider this and discuss it with his wife and then contact me with his decision.          Notes:   He clearly has a large left varicocele that is symptomatic. He does not have any testicular atrophy. Because it is causing him a great deal of discomfort at work and is preventing him from engaging in his exercising activities he would like to consider having something done.  I discussed the 2 procedures that can be used to treat his varicocele. We 1st discussed an open surgical procedure going over the incision used, the risks and complications, the outpatient nature of the procedure as well as the probability of success and the anticipated postoperative course. The other option would be to have the he will  treated by an interventional radiologist with embolization. I did discuss this with him as well and he was not sure how he would like to proceed with treatment. He does want to get back to doing exercises as soon as possible.

## 2018-07-02 ENCOUNTER — Encounter (HOSPITAL_BASED_OUTPATIENT_CLINIC_OR_DEPARTMENT_OTHER)
Admission: RE | Disposition: A | Discharge status: Home / Self Care | Payer: Self-pay | Source: Ambulatory Visit | Attending: Urology

## 2018-07-02 ENCOUNTER — Encounter (HOSPITAL_BASED_OUTPATIENT_CLINIC_OR_DEPARTMENT_OTHER): Payer: Self-pay | Admitting: Anesthesiology

## 2018-07-02 ENCOUNTER — Ambulatory Visit (HOSPITAL_BASED_OUTPATIENT_CLINIC_OR_DEPARTMENT_OTHER): Payer: Medicare Other | Admitting: Anesthesiology

## 2018-07-02 ENCOUNTER — Other Ambulatory Visit: Payer: Self-pay

## 2018-07-02 ENCOUNTER — Ambulatory Visit (HOSPITAL_BASED_OUTPATIENT_CLINIC_OR_DEPARTMENT_OTHER)
Admission: RE | Admit: 2018-07-02 | Discharge: 2018-07-02 | Disposition: A | Discharge status: Home / Self Care | Payer: Medicare Other | Source: Ambulatory Visit | Attending: Urology | Admitting: Urology

## 2018-07-02 DIAGNOSIS — I861 Scrotal varices: Secondary | ICD-10-CM | POA: Diagnosis present

## 2018-07-02 DIAGNOSIS — Z79899 Other long term (current) drug therapy: Secondary | ICD-10-CM | POA: Diagnosis not present

## 2018-07-02 DIAGNOSIS — G20 Parkinson's disease: Secondary | ICD-10-CM | POA: Insufficient documentation

## 2018-07-02 HISTORY — PX: VARICOCELECTOMY: SHX1084

## 2018-07-02 HISTORY — DX: Gastro-esophageal reflux disease without esophagitis: K21.9

## 2018-07-02 SURGERY — EXCISION, VARICOCELE
Anesthesia: General | Laterality: Left

## 2018-07-02 MED ORDER — CEFAZOLIN SODIUM-DEXTROSE 2-3 GM-%(50ML) IV SOLR
INTRAVENOUS | Status: DC | PRN
  Administered 2018-07-02: 2 g via INTRAVENOUS

## 2018-07-02 MED ORDER — CEFAZOLIN SODIUM 1 G IJ SOLR
INTRAMUSCULAR | Status: AC
  Filled 2018-07-02: qty 20

## 2018-07-02 MED ORDER — EPHEDRINE SULFATE-NACL 50-0.9 MG/10ML-% IV SOSY
PREFILLED_SYRINGE | INTRAVENOUS | Status: DC | PRN
  Administered 2018-07-02 (×4): 10 mg via INTRAVENOUS

## 2018-07-02 MED ORDER — LACTATED RINGERS IV SOLN
INTRAVENOUS | Status: DC
  Administered 2018-07-02 (×2): via INTRAVENOUS
  Filled 2018-07-02: qty 1000

## 2018-07-02 MED ORDER — PROPOFOL 10 MG/ML IV BOLUS
INTRAVENOUS | Status: DC | PRN
  Administered 2018-07-02: 150 mg via INTRAVENOUS

## 2018-07-02 MED ORDER — BUPIVACAINE HCL (PF) 0.25 % IJ SOLN
INTRAMUSCULAR | Status: DC | PRN
  Administered 2018-07-02: 10 mL

## 2018-07-02 MED ORDER — FENTANYL CITRATE (PF) 100 MCG/2ML IJ SOLN
25.0000 ug | INTRAMUSCULAR | Status: DC | PRN
  Filled 2018-07-02: qty 1

## 2018-07-02 MED ORDER — FENTANYL CITRATE (PF) 100 MCG/2ML IJ SOLN
INTRAMUSCULAR | Status: AC
  Filled 2018-07-02: qty 2

## 2018-07-02 MED ORDER — LIDOCAINE 2% (20 MG/ML) 5 ML SYRINGE
INTRAMUSCULAR | Status: AC
  Filled 2018-07-02: qty 5

## 2018-07-02 MED ORDER — LIDOCAINE 2% (20 MG/ML) 5 ML SYRINGE
INTRAMUSCULAR | Status: DC | PRN
  Administered 2018-07-02: 60 mg via INTRAVENOUS

## 2018-07-02 MED ORDER — FENTANYL CITRATE (PF) 100 MCG/2ML IJ SOLN
INTRAMUSCULAR | Status: DC | PRN
  Administered 2018-07-02 (×2): 50 ug via INTRAVENOUS

## 2018-07-02 MED ORDER — TRAMADOL HCL 50 MG PO TABS: 16.0000 mg | ORAL_TABLET | Freq: Four times a day (QID) | ORAL | 0 refills | Status: DC | PRN

## 2018-07-02 MED ORDER — PAPAVERINE HCL 30 MG/ML IJ SOLN
60.0000 mg | Freq: Once | INTRAMUSCULAR | Status: DC
  Filled 2018-07-02 (×2): qty 2

## 2018-07-02 MED ORDER — EPHEDRINE 5 MG/ML INJ
INTRAVENOUS | Status: AC
  Filled 2018-07-02: qty 10

## 2018-07-02 MED ORDER — ONDANSETRON HCL 4 MG/2ML IJ SOLN
4.0000 mg | Freq: Once | INTRAMUSCULAR | Status: DC | PRN
  Filled 2018-07-02: qty 2

## 2018-07-02 SURGICAL SUPPLY — 54 items
4-0 SILK TIE 30IN ×2 IMPLANT
APPLICATOR COTTON TIP 6IN STRL (MISCELLANEOUS) IMPLANT
BENZOIN TINCTURE PRP APPL 2/3 (GAUZE/BANDAGES/DRESSINGS) IMPLANT
BLADE CLIPPER SURG (BLADE) ×2 IMPLANT
BLADE SURG 10 STRL SS (BLADE) ×2 IMPLANT
BLADE SURG 15 STRL LF DISP TIS (BLADE) ×1 IMPLANT
BLADE SURG 15 STRL SS (BLADE) ×1
CANISTER SUCT 3000ML PPV (MISCELLANEOUS) ×2 IMPLANT
CANISTER SUCTION 1200CC (MISCELLANEOUS) IMPLANT
CLOTH BEACON ORANGE TIMEOUT ST (SAFETY) ×2 IMPLANT
COVER BACK TABLE 60X90IN (DRAPES) ×2 IMPLANT
COVER MAYO STAND STRL (DRAPES) ×2 IMPLANT
COVER WAND RF STERILE (DRAPES) ×2 IMPLANT
DERMABOND ADVANCED (GAUZE/BANDAGES/DRESSINGS) ×1
DERMABOND ADVANCED .7 DNX12 (GAUZE/BANDAGES/DRESSINGS) ×1 IMPLANT
DRAIN PENROSE 18X1/2 LTX STRL (DRAIN) ×2 IMPLANT
DRAIN PENROSE 18X1/4 LTX STRL (WOUND CARE) IMPLANT
DRAPE LAPAROTOMY 100X72 PEDS (DRAPES) ×2 IMPLANT
DRSG TEGADERM 4X4.75 (GAUZE/BANDAGES/DRESSINGS) ×2 IMPLANT
ELECT REM PT RETURN 9FT ADLT (ELECTROSURGICAL) ×2
ELECTRODE REM PT RTRN 9FT ADLT (ELECTROSURGICAL) ×1 IMPLANT
GLOVE BIO SURGEON STRL SZ8 (GLOVE) ×2 IMPLANT
GOWN W/COTTON TOWEL STD LRG (GOWNS) ×4 IMPLANT
GOWN XL W/COTTON TOWEL STD (GOWNS) ×10 IMPLANT
IV CATH 18G SAFETY (IV SOLUTION) ×2 IMPLANT
IV NS IRRIG 3000ML ARTHROMATIC (IV SOLUTION) IMPLANT
KIT TURNOVER CYSTO (KITS) ×2 IMPLANT
LOOP VESSEL MAXI BLUE (MISCELLANEOUS) ×2 IMPLANT
MANIFOLD NEPTUNE II (INSTRUMENTS) IMPLANT
NEEDLE HYPO 25X1 1.5 SAFETY (NEEDLE) ×2 IMPLANT
NS IRRIG 500ML POUR BTL (IV SOLUTION) ×2 IMPLANT
PACK BASIN DAY SURGERY FS (CUSTOM PROCEDURE TRAY) ×2 IMPLANT
PENCIL BUTTON HOLSTER BLD 10FT (ELECTRODE) ×2 IMPLANT
SPONGE INTESTINAL PEANUT (DISPOSABLE) ×2 IMPLANT
STRIP CLOSURE SKIN 1/2X4 (GAUZE/BANDAGES/DRESSINGS) IMPLANT
STRIP CLOSURE SKIN 1/4X4 (GAUZE/BANDAGES/DRESSINGS) IMPLANT
SUPPORT SCROTAL LG STRP (MISCELLANEOUS) IMPLANT
SUT CHROMIC 3 0 SH 27 (SUTURE) ×2 IMPLANT
SUT MNCRL AB 4-0 PS2 18 (SUTURE) ×2 IMPLANT
SUT SILK 3 0 TIES 17X18 (SUTURE) ×1
SUT SILK 3-0 18XBRD TIE BLK (SUTURE) ×1 IMPLANT
SUT SILK 4 0 TIES 17X18 (SUTURE) ×2 IMPLANT
SUT VIC AB 3-0 SH 27 (SUTURE) ×1
SUT VIC AB 3-0 SH 27X BRD (SUTURE) ×1 IMPLANT
SUT VIC AB 4-0 RB1 27 (SUTURE)
SUT VIC AB 4-0 RB1 27X BRD (SUTURE) IMPLANT
SUT VICRYL 4-0 PS2 18IN ABS (SUTURE) IMPLANT
SUT VICRYL 6 0 RB 1 (SUTURE) IMPLANT
SYR CONTROL 10ML LL (SYRINGE) ×2 IMPLANT
TOWEL OR 17X24 6PK STRL BLUE (TOWEL DISPOSABLE) ×4 IMPLANT
TRAY DSU PREP LF (CUSTOM PROCEDURE TRAY) ×2 IMPLANT
TUBE CONNECTING 12X1/4 (SUCTIONS) ×2 IMPLANT
WATER STERILE IRR 3000ML UROMA (IV SOLUTION) IMPLANT
WATER STERILE IRR 500ML POUR (IV SOLUTION) ×2 IMPLANT

## 2018-07-02 NOTE — Anesthesia Postprocedure Evaluation (Signed)
Anesthesia Post Note  Patient: PRESTIN MUNCH  Procedure(s) Performed: VARICOCELECTOMY (Left )     Patient location during evaluation: PACU Anesthesia Type: General Level of consciousness: awake and alert Pain management: pain level controlled Vital Signs Assessment: post-procedure vital signs reviewed and stable Respiratory status: spontaneous breathing, nonlabored ventilation, respiratory function stable and patient connected to nasal cannula oxygen Cardiovascular status: blood pressure returned to baseline and stable Postop Assessment: no apparent nausea or vomiting Anesthetic complications: no    Last Vitals:  Vitals:   07/02/18 1315 07/02/18 1406  BP: 123/70 124/81  Pulse: 88 86  Resp: 15 16  Temp:  36.7 C  SpO2: 97% 97%    Last Pain:  Vitals:   07/02/18 1406  TempSrc: Oral  PainSc:                  Lilliann Rossetti P Kacyn Souder

## 2018-07-02 NOTE — Anesthesia Procedure Notes (Signed)
Procedure Name: LMA Insertion Date/Time: 07/02/2018 10:49 AM Performed by: Lieutenant Diego, CRNA Pre-anesthesia Checklist: Patient identified, Emergency Drugs available, Suction available and Patient being monitored Patient Re-evaluated:Patient Re-evaluated prior to induction Oxygen Delivery Method: Circle system utilized Preoxygenation: Pre-oxygenation with 100% oxygen Induction Type: IV induction Ventilation: Mask ventilation without difficulty LMA: LMA inserted LMA Size: 4.0 Number of attempts: 1 Placement Confirmation: positive ETCO2 and breath sounds checked- equal and bilateral Tube secured with: Tape Dental Injury: Teeth and Oropharynx as per pre-operative assessment

## 2018-07-02 NOTE — Op Note (Addendum)
Preoperative Diagnosis: Left symptomatic varicocele  Postoperative Diagnosis: Same  Procedure(s) Performed:  VARICOCELECTOMY, LEFT  Surgeon: Kathie Rhodes, MD  Resident Surgeon: Dorothey Baseman, MD  Assistant(s): None  Anesthesia:  General  Fluids:  See anesthesia record  Estimated blood loss:  Minimal  Specimens: None  Drains: None  Complications: None  Indications:  This is a 72 y.o. patient with a history of left varicocele with pain associated with the varicocele limiting activities. He was previously evaluated and management options discussed. After discussion of the risks & benefits and alternatives to surgical approach, the patient wishes to proceed with left varicocelectomy. Risks & benefits of the procedure discussed with the patient, who wishes to proceed.   Findings:  - Numerous tortuous veins identified within the spermatic cord and ligated.  - Testicular artery identified and preserved. Noted to be intact with doppler at the completion of the procedure.  - Vas deferens identified, preserved, and noted to be intact at the completion of the procedure.  Procedure:  Prior to procedure consent was obtained. Patient was brought to the operating room and a brief timeout was done to ensure correct patient, correct procedure, correct site.  General anesthesia was administered and patient was placed in supine position. Appropriate peri-operative antibiotics were administered. His genitalia and abdomen was then prepped and draped in usual sterile fashion.   A 3 cm incision was made in the subinguinal region. We dissected down through the subcutaneous tissue until we reached the spermatic cord. A vein within the subcutaneous tissue was ligated with 4-0 silk tie. The spermatic cord was identified and a Penrose drain was placed under the cord. We then opened the cord with sharp dissection. We proceeded to identify the testicular artery with the doppler and it was noted to be deep  within the cord. Superficial, tortuous veins were identified and ligated with 4-0 silk ties, confirming each time prior to ligation that the testicular artery remained deep to these veins using doppler. The vas deferens was identified and a loop was placed around it and it was excluded from the remainder of the spermatic cord. We then continued to ligate the veins in the cord with 4-0 silk ties, preserving lymphatics. Once the artery was identified and loop was placed around it and it was excluded from the remainder of the spermatic cord. Once all the veins were ligated we once again check for flow in the testicular artery and we noted good flow. We then inspected the operative field and no residual bleeding was noted. We then closed the subcutaneous tissues with 3-0 Vicryl in a running fashion.  We then closed the skin with 4-0 Monocryl in a running fashion.  Skin glue was then placed over the incision. This then concluded the procedure which was well tolerated by the patient.  Plan: Discharge home when meeting PACU discharge criteria and follow-up as previously scheduled.

## 2018-07-02 NOTE — Transfer of Care (Signed)
Immediate Anesthesia Transfer of Care Note  Patient: Joshua Browning  Procedure(s) Performed: VARICOCELECTOMY (Left )  Patient Location: PACU  Anesthesia Type:General  Level of Consciousness: awake  Airway & Oxygen Therapy: Patient Spontanous Breathing and Patient connected to nasal cannula oxygen  Post-op Assessment: Report given to RN and Post -op Vital signs reviewed and stable  Post vital signs: Reviewed and stable  Last Vitals:  Vitals Value Taken Time  BP 130/72 07/02/2018 12:23 PM  Temp    Pulse 85 07/02/2018 12:25 PM  Resp 17 07/02/2018 12:25 PM  SpO2 97 % 07/02/2018 12:25 PM  Vitals shown include unvalidated device data.  Last Pain:  Vitals:   07/02/18 0900  TempSrc:   PainSc: 0-No pain      Patients Stated Pain Goal: 5 (61/47/[id number])  Complications: No apparent anesthesia complications

## 2018-07-02 NOTE — Anesthesia Preprocedure Evaluation (Addendum)
Anesthesia Evaluation  Patient identified by MRN, date of birth, ID band Patient awake    Reviewed: Allergy & Precautions, NPO status , Patient's Chart, lab work & pertinent test results  Airway Mallampati: I  TM Distance: >3 FB Neck ROM: Full    Dental  (+) Missing,    Pulmonary neg pulmonary ROS,    Pulmonary exam normal breath sounds clear to auscultation       Cardiovascular negative cardio ROS Normal cardiovascular exam Rhythm:Regular Rate:Normal     Neuro/Psych Depression Idiopathic Parkinson's disease Memory change  Neuromuscular disease    GI/Hepatic Neg liver ROS, GERD  Medicated and Controlled,  Endo/Other  negative endocrine ROS  Renal/GU negative Renal ROS     Musculoskeletal negative musculoskeletal ROS (+)   Abdominal   Peds  Hematology negative hematology ROS (+)   Anesthesia Other Findings LEFT VARICOCELE  Reproductive/Obstetrics                            Anesthesia Physical Anesthesia Plan  ASA: II  Anesthesia Plan: General   Post-op Pain Management:    Induction: Intravenous  PONV Risk Score and Plan: 2 and Ondansetron, Dexamethasone and Treatment may vary due to age or medical condition  Airway Management Planned: LMA  Additional Equipment:   Intra-op Plan:   Post-operative Plan: Extubation in OR  Informed Consent: I have reviewed the patients History and Physical, chart, labs and discussed the procedure including the risks, benefits and alternatives for the proposed anesthesia with the patient or authorized representative who has indicated his/her understanding and acceptance.   Dental advisory given  Plan Discussed with: CRNA  Anesthesia Plan Comments:         Anesthesia Quick Evaluation

## 2018-07-02 NOTE — Discharge Instructions (Signed)
Scrotal surgery postoperative instructions ° °Wound: ° °In most cases your incision will have absorbable sutures that will dissolve within the first 10-20 days. Some will fall out even earlier. Expect some redness as the sutures dissolved but this should occur only around the sutures. If there is generalized redness, especially with increasing pain or swelling, let us know. The scrotum will very likely get "black and blue" as the blood in the tissues spread. Sometimes the whole scrotum will turn colors. The black and blue is followed by a yellow and brown color. In time, all the discoloration will go away. In some cases some firm swelling in the area of the testicle may persist for up to 4-6 weeks after the surgery and is considered normal in most cases. ° °Diet: ° °You may return to your normal diet within 24 hours following your surgery. You may note some mild nausea and possibly vomiting the first 6-8 hours following surgery. This is usually due to the side effects of anesthesia, and will disappear quite soon. I would suggest clear liquids and a very light meal the first evening following your surgery. ° °Activity: ° °Your physical activity should be restricted the first 48 hours. During that time you should remain relatively inactive, moving about only when necessary. During the first 7-10 days following surgery he should avoid lifting any heavy objects (anything greater than 15 pounds), and avoid strenuous exercise. If you work, ask us specifically about your restrictions, both for work and home. We will write a note to your employer if needed. ° °You should plan to wear a tight pair of jockey shorts or an athletic supporter for the first 4-5 days, even to sleep. This will keep the scrotum immobilized to some degree and keep the swelling down. ° °Ice packs should be placed on and off over the scrotum for the first 48 hours. Frozen peas or corn in a ZipLock bag can be frozen, used and re-frozen. Fifteen minutes  on and 15 minutes off is a reasonable schedule. The ice is a good pain reliever and keeps the swelling down. ° °Hygiene: ° °You may shower 48 hours after your surgery. Tub bathing should be restricted until the seventh day. ° °Medication: ° °You will be sent home with some type of pain medication. In many cases you will be sent home with a narcotic pain pill (hydrococone or oxycodone). If the pain is not too bad, you may take either Tylenol (acetaminophen) or Advil (ibuprofen) which contain no narcotic agents, and might be tolerated a little better, with fewer side effects. If the pain medication you are sent home with does not control the pain, you will have to let us know. Some narcotic pain medications cannot be given or refilled by a phone call to a pharmacy. ° °Problems you should report to us: ° °· Fever of 101.0 degrees Fahrenheit or greater. °· Moderate or severe swelling under the skin incision or involving the scrotum. °· Drug reaction such as hives, a rash, nausea or vomiting. ° ° °Post Anesthesia Home Care Instructions ° °Activity: °Get plenty of rest for the remainder of the day. A responsible individual must stay with you for 24 hours following the procedure.  °For the next 24 hours, DO NOT: °-Drive a car °-Operate machinery °-Drink alcoholic beverages °-Take any medication unless instructed by your physician °-Make any legal decisions or sign important papers. ° °Meals: °Start with liquid foods such as gelatin or soup. Progress to regular foods as tolerated. Avoid   greasy, spicy, heavy foods. If nausea and/or vomiting occur, drink only clear liquids until the nausea and/or vomiting subsides. Call your physician if vomiting continues. ° °Special Instructions/Symptoms: °Your throat may feel dry or sore from the anesthesia or the breathing tube placed in your throat during surgery. If this causes discomfort, gargle with warm salt water. The discomfort should disappear within 24 hours. ° °If you had a  scopolamine patch placed behind your ear for the management of post- operative nausea and/or vomiting: ° °1. The medication in the patch is effective for 72 hours, after which it should be removed.  Wrap patch in a tissue and discard in the trash. Wash hands thoroughly with soap and water. °2. You may remove the patch earlier than 72 hours if you experience unpleasant side effects which may include dry mouth, dizziness or visual disturbances. °3. Avoid touching the patch. Wash your hands with soap and water after contact with the patch. °  °

## 2018-07-03 ENCOUNTER — Encounter (HOSPITAL_BASED_OUTPATIENT_CLINIC_OR_DEPARTMENT_OTHER): Payer: Self-pay | Admitting: Urology

## 2018-07-24 ENCOUNTER — Other Ambulatory Visit: Payer: Self-pay | Admitting: Neurology

## 2018-08-17 NOTE — Progress Notes (Signed)
Joshua Browning was seen today in the movement disorders clinic for neurologic consultation at the request of Lawerance Cruel, MD.  The consultation is for the evaluation of tremor and to r/o PD.  This patient is accompanied in the office by his spouse who supplements the history.  Tremor began about 5 years ago and involves both hands.  Tremor is most evident when he is picking up a fork or pencil or trying to hold a book.  He is R hand dominant but both hands seem to shake equally.  He thinks that it has changed/increased a little over the years.  05/30/16 update:  Pt f/u today, accompanied by his wife who supplements the history.  He had an MRI of the brain since last visit that I did review. This was done on 12/08/15.  There was at least moderate small vessel disease.  No falls since our last visit.  His balance has been worse.  He feels that he wants to fall backwards, especially on the stairs.  Notes some tremor, worse with anxiety.  Both at rest and with holding something.  Was supposed to have neuropsych testing since last visit but when called to schedule it, he declined it.  States that he was confused as to why we wanted to schedule it.    10/04/16 update:  Pt f/u today, accompanied by his wife who supplements the history.  He had neuropsych testing with Dr. Si Raider on 07/19/2016.  He has already followed up with her for the results.  Results were largely normal, with some evidence of mild cognitive impairment.  There was not evidence of a neurodegenerative dementia.  Dr. Si Raider stated that cognitive behavioral therapy could be of value.  He has been attending PT lately and I have reviewed those records.   Pt states that it really helped and he learned a lot of techniques for getting up out of cars, etc easily.  He has completed PT now.  He has had no falls.  He has had a few times where he was trying to get up off the couch and he would fall back into it.  His tremor is worse in the R hand than  the L.  Not spilling anything.  Morning is worse for tremor than afternoon  04/25/17 update:  Pt seen in f/u for tremor.  Reports that tremor has been a little worse.  Noting a little occasional jerking/twising of the right arm.  Happens when doing things like holding steering wheel.   Pt denies falls but balance is a little worse and has near falls.  When gets up, he will sometimes fall back into the couch.  Legs feel a little weak.  Pt denies lightheadedness, near syncope.  No hallucinations.  Mood has been good.  Reviewed his ophth notes since last visit.  He will return there for f/u in one year.  Also c/o "dull pain" on L side of neck.  States that he was told by PCP to talk ibuprofen and he has but it is not getting better.  He is now having more sharp, shooting pains on top of that dull pain.  Not worse to move the neck.  Also c/o terrible cramping in the hands and is so bad that has to stop eating and put down the fork.  08/22/17 update: Patient is seen today in follow-up for early Parkinson's disease.  He was started on levodopa last visit.  He was having some nausea with medication  despite eating with it and we gave him a small prescription for Zofran.  He states today that he never got that but he is taking it with bread and a slice of jelly and he is doing better.  He definitely notices that levodopa helps with the tremor.  He notices that when it wears off, he will start to feel wiggly, which is independent of tremor.  He notices that when he takes the levodopa, that will go away.  He can tell when the medication is wearing off.  He is still having hand cramping but those are mostly related to starting cars and squeezing the breaks.  He doesn't have those when he doesn't work on the weekends.  He does have nighttime leg cramps. He was complaining about neck pain last visit.  An MRI of the cervical spine was completed.  This demonstrated C5-C6 degenerative changes with facet edema.  There was also  multilevel neuroforaminal stenosis and moderate to severe left C3-C4, moderate left C4-C5 and moderate left C5-C6 neural foraminal stenosis.  He is finding that he is a bit wiggly in the mid afternoon.   We offered physical therapy and if that was not helpful to see a neurosurgeon.  He initially did not want anything, but ultimately called back for physical therapy.  He states that his neck is doing better.  All of the therapy is a little overwhelming with his job but he tries to do his job at home.  Wife states that he is depressed about this but patient denies this.  Wife states that falling on sleep a lot.  However, used to walk the dog a lot and enjoyed that but dog died.  Found that he is more deconditioned now that he is no longer exercising.  He is going to [employer].    11/28/17 update: Patient is seen today in follow-up for Parkinson's disease. This patient is accompanied in the office by his spouse who supplements the history. He is on carbidopa/levodopa 25/100, 1 tablet 3 times per day.  Mirtazapine was started last visit for mild depression.  Reports today that he is sleeping better.  Mood has been "some better" but wife isn't convinced.  Mood depends mostly on the job.  Pt denies falls.  Pt denies lightheadedness, near syncope.  No hallucinations.   Did go to PT for neck pain.  He is doing one on one training at [employer].  He just started that and has only been there 2 times.  He would eventually like to get back to [employer].    04/10/18 update: Patient is seen today in follow-up for Parkinson's disease.   Patient is on carbidopa/levodopa 25/100, 1 tablet 3 times per day.  His biggest issues he has is in the morning with tremulousness and stiffness.  Having cramping at night and RLS symptoms.  Overall, though, feels well.  He is still working at the [employer].   He enjoys that.  No problems with that.  No falls.  No lightheadedness or near syncope.  No hallucinations.  He is on mirtazapine for mood.  Saw  dermatology at [facility] dermatology since our last visit.  Had biopsy and was not melanoma.  Has another appt later this week.  Doing [employer] one day per week and doing personal trainer there one day a week.  Loves that.  08/21/2018 update: Patient follows up today for Parkinson's disease.  He is on carbidopa/levodopa 25/100, 1 tablet 3 times per day.  I added carbidopa/levodopa  50/200 at bedtime last visit for cramping at night and to help with first morning on.  He states today that he is doing much better and feels that the medication is working.  He has had no falls.  No lightheadedness or near syncope.  No hallucinations.  On mirtazapine for sleep and states that he is sleeping well.  States that mood has been good - "I enjoyed the holidays."  In regards to memory, states that he is having trouble with peoples names.  He has no trouble with getting lost or remembering to take meds (usually).  He carries a pill box with him to work and takes medication there without problem.  He does not yet wish to repeat neurocognitive testing, last done in 2017.  Records are reviewed since last visit.  He was in the hospital in November for a surgery on a left symptomatic varicocele.  He does think that this set him back a bit physically because today is first day back to gym and only one day has been back to [employer].    ALLERGIES:  No Known Allergies  CURRENT MEDICATIONS:  Outpatient Encounter Medications as of 08/21/2018  Medication Sig  . b complex vitamins tablet Take 1 tablet by mouth daily.  . carbidopa-levodopa (SINEMET CR) 50-200 MG tablet Take 1 tablet by mouth at bedtime.  . carbidopa-levodopa (SINEMET IR) 25-100 MG tablet Take 1 tablet by mouth 3 (three) times daily.  . Cholecalciferol (VITAMIN D3) 1000 UNITS CAPS Take 1 capsule by mouth every evening.  . famotidine (PEPCID) 20 MG tablet Take 20 mg by mouth at bedtime. Takes pepcid ac  . Glucosamine Sulfate 1000 MG CAPS Take by mouth 2 (two) times daily.  . Magnesium  100 MG CAPS Take 1 capsule by mouth every evening.  . mirtazapine (REMERON) 15 MG tablet TAKE 1 TABLET BY MOUTH EVERYDAY AT BEDTIME  . Multiple Vitamins-Minerals (VITABASIC SENIOR PO) Take 1 tablet by mouth.  . [DISCONTINUED] traMADol (ULTRAM) 50 MG tablet Take 0.5 tablets (25 mg total) by mouth every 6 (six) hours as needed.   No facility-administered encounter medications on file as of 08/21/2018.     PAST MEDICAL HISTORY:   Past Medical History:  Diagnosis Date  . BPH (benign prostatic hyperplasia)   . Foley catheter in place    removed after turp  . GERD (gastroesophageal reflux disease)   . Nocturnal leg cramps    occ  . Urinary retention   . Wears glasses     PAST SURGICAL HISTORY:   Past Surgical History:  Procedure Laterality Date  . CATARACT EXTRACTION W/ INTRAOCULAR LENS  IMPLANT, BILATERAL Bilateral   . INGUINAL HERNIA REPAIR Bilateral RIGHT x 2 2229 umbilical done with last right   left last one done 10 yrs ago  . RE-DO RIGHT INGUINAL HERNIA REPAIR AND UMBILICAL HERNIA REPAIR  2001  . REMOVAL LEFT EYE INTRAOCULAR LENS AND REPLACEMENT   08-29-2013  . TRANSURETHRAL RESECTION OF PROSTATE N/A 09/23/2013   Procedure: TRANSURETHRAL RESECTION OF THE PROSTATE (TURP) WITH GYRUS;  Surgeon: Claybon Jabs, MD;  Location: [facility];  Service: Urology;  Laterality: N/A;  . VARICOCELECTOMY Left 07/02/2018   Procedure: VARICOCELECTOMY;  Surgeon: Kathie Rhodes, MD;  Location: [facility];  Service: Urology;  Laterality: Left;    SOCIAL HISTORY:   Social History   Socioeconomic History  . Marital status: Married    Spouse name: Not on file  . Number of children: Not on file  .  Years of education: Not on file  . Highest education level: Not on file  Occupational History  . Occupation: Chief Financial Officer    Comment: did Chief Financial Officer and IT work; now works at [employer]  . Financial resource strain: Not on file  . Food insecurity:    Worry: Not  on file    Inability: Not on file  . Transportation needs:    Medical: Not on file    Non-medical: Not on file  Tobacco Use  . Smoking status: Never Smoker  . Smokeless tobacco: Never Used  Substance and Sexual Activity  . Alcohol use: No    Alcohol/week: 0.0 standard drinks  . Drug use: No  . Sexual activity: Not on file  Lifestyle  . Physical activity:    Days per week: Not on file    Minutes per session: Not on file  . Stress: Not on file  Relationships  . Social connections:    Talks on phone: Not on file    Gets together: Not on file    Attends religious service: Not on file    Active member of club or organization: Not on file    Attends meetings of clubs or organizations: Not on file    Relationship status: Not on file  . Intimate partner violence:    Fear of current or ex partner: Not on file    Emotionally abused: Not on file    Physically abused: Not on file    Forced sexual activity: Not on file  Other Topics Concern  . Not on file  Social History Narrative  . Not on file    FAMILY HISTORY:   Family Status  Relation Name Status  . Mother  Deceased       CHF  . Father  Deceased       DM, heart attack  . Son  Alive       healthy  . Son  Alive       healthy  . Son  Alive       healthy    ROS:  Review of Systems  Constitutional: Negative.   HENT: Negative.   Eyes: Negative.   Cardiovascular: Negative.   Gastrointestinal: Negative.   Genitourinary: Negative.   Skin: Negative.   Endo/Heme/Allergies: Negative.     PHYSICAL EXAMINATION:    VITALS:   Vitals:   08/21/18 0817  BP: 136/76  Pulse: 88  SpO2: 96%  Weight: 180 lb (81.6 kg)  Height: 5\' 10"  (1.778 m)    GEN:  The patient appears stated age and is in NAD. HEENT:  Normocephalic, atraumatic.  The mucous membranes are moist. The superficial temporal arteries are without ropiness or tenderness. CV:  RRR Lungs:  CTAB Neck/HEME:  There are no carotid bruits bilaterally.  Neurological  examination:  Orientation: The patient is alert and oriented x3. Cranial nerves: There is good facial symmetry. The speech is fluent and clear. Soft palate rises symmetrically and there is no tongue deviation. Hearing is intact to conversational tone. Sensation: Sensation is intact to light touch throughout Motor: Strength is 5/5 in the bilateral upper and lower extremities.   Shoulder shrug is equal and symmetric.  There is no pronator drift.  Movement examination: Tone: There is mild increased tone in the RUE Abnormal movements: There is RUE tremor only with ambulation and very mild Coordination:  There is no decremation, with any form of RAMS, including alternating supination and pronation of the forearm, hand opening  and closing, finger taps, heel taps and toe taps bilaterally Gait and Station: The patient arises out of the chair without hands on 2nd attempt.  He walks well down the hall.  He is stooped at the waist with mild RUE tremor with ambulation.    Labs:   Lab Results  Component Value Date   TSH 1.11 11/30/2015   Lab Results  Component Value Date   [id number] 977 11/30/2015   Lab Results  Component Value Date   FOLATE 18.8 11/30/2015     ASSESSMENT/PLAN:  1.  Idiopathic Parkinson's disease  -He will continue carbidopa/levodopa 25/100 three times per day  -continue Carbidopa/levodopa 50/200 at bedtime.  This has helped with first morning on and nighttime cramps.  -he is following with dermatology.  He is not sure when next appt is but thinks that it is soon 2.  Gait instability  -Patient feels a little bit more unstable since his recent surgery, but is looking forward to getting back to exercise with his personal trainer and rock steady boxing.  He thinks once he does that, he will do better and I agree with that. 3.  Cerebral small vessel disease  -if able and no hx of GI bleed, recommend ASA, 81 mg EC daily.  States that he is taking this three times per week.   4.   Neck pain  -Cervical spine MRI demonstrated evidence of neural foraminal stenosis.  Physical therapy has helped. 5.  Memory change  -He had neuropsych testing with Dr. Si Raider on 07/19/2016.  Results were largely normal, with some evidence of mild cognitive impairment. Having more word finding issues and may need to repeat in the future.  We have been holding off on repeating as wife has not been here the last few visits to discuss her perception of memory.  We talked extensively today about repeating that testing.  He is going to talk to his wife about this.  I don't think that he has dementia.   6. Mild depression  -better with remeron 15 mg q hs.  Risks, benefits, side effects and alternative therapies were discussed.  The opportunity to ask questions was given and they were answered to the best of my ability.  The patient expressed understanding and willingness to follow the outlined treatment protocols. 7.  Follow up is anticipated in the next few months, sooner should new neurologic issues arise.  Much greater than 50% of this visit was spent in counseling and coordinating care.  Total face to face time:  25 min

## 2018-08-21 ENCOUNTER — Encounter: Payer: Self-pay | Admitting: Neurology

## 2018-08-21 ENCOUNTER — Ambulatory Visit: Payer: Medicare Other | Admitting: Neurology

## 2018-08-21 VITALS — BP 136/76 | HR 88 | Ht 70.0 in | Wt 180.0 lb

## 2018-08-21 DIAGNOSIS — G20 Parkinson's disease: Secondary | ICD-10-CM

## 2018-08-21 DIAGNOSIS — R413 Other amnesia: Secondary | ICD-10-CM

## 2018-08-21 NOTE — Patient Instructions (Signed)
Let us know if you want to re-do the neurocognitive testing.  At this point, you would need to do in in [place] but we are looking to hire someone in our office.  Let us know when you need refills of your medication.  Get back to boxing and your trainer.  Hpapy new year!

## 2018-10-02 ENCOUNTER — Other Ambulatory Visit: Payer: Self-pay | Admitting: Neurology

## 2018-10-19 ENCOUNTER — Other Ambulatory Visit: Payer: Self-pay | Admitting: Neurology

## 2019-01-01 ENCOUNTER — Ambulatory Visit: Payer: Medicare Other | Admitting: Neurology

## 2019-01-15 ENCOUNTER — Encounter: Payer: Self-pay | Admitting: Neurology

## 2019-01-15 NOTE — Progress Notes (Signed)
Joshua Browning was seen today in the movement disorders clinic for neurologic consultation at the request of Lawerance Cruel, MD.  The consultation is for the evaluation of tremor and to r/o PD.  This patient is accompanied in the office by his spouse who supplements the history.  Tremor began about 5 years ago and involves both hands.  Tremor is most evident when he is picking up a fork or pencil or trying to hold a book.  He is R hand dominant but both hands seem to shake equally.  He thinks that it has changed/increased a little over the years.  05/30/16 update:  Pt f/u today, accompanied by his wife who supplements the history.  He had an MRI of the brain since last visit that I did review. This was done on 12/08/15.  There was at least moderate small vessel disease.  No falls since our last visit.  His balance has been worse.  He feels that he wants to fall backwards, especially on the stairs.  Notes some tremor, worse with anxiety.  Both at rest and with holding something.  Was supposed to have neuropsych testing since last visit but when called to schedule it, he declined it.  States that he was confused as to why we wanted to schedule it.    10/04/16 update:  Pt f/u today, accompanied by his wife who supplements the history.  He had neuropsych testing with Dr. Si Raider on 07/19/2016.  He has already followed up with her for the results.  Results were largely normal, with some evidence of mild cognitive impairment.  There was not evidence of a neurodegenerative dementia.  Dr. Si Raider stated that cognitive behavioral therapy could be of value.  He has been attending PT lately and I have reviewed those records.   Pt states that it really helped and he learned a lot of techniques for getting up out of cars, etc easily.  He has completed PT now.  He has had no falls.  He has had a few times where he was trying to get up off the couch and he would fall back into it.  His tremor is worse in the R hand than  the L.  Not spilling anything.  Morning is worse for tremor than afternoon  04/25/17 update:  Pt seen in f/u for tremor.  Reports that tremor has been a little worse.  Noting a little occasional jerking/twising of the right arm.  Happens when doing things like holding steering wheel.   Pt denies falls but balance is a little worse and has near falls.  When gets up, he will sometimes fall back into the couch.  Legs feel a little weak.  Pt denies lightheadedness, near syncope.  No hallucinations.  Mood has been good.  Reviewed his ophth notes since last visit.  He will return there for f/u in one year.  Also c/o "dull pain" on L side of neck.  States that he was told by PCP to talk ibuprofen and he has but it is not getting better.  He is now having more sharp, shooting pains on top of that dull pain.  Not worse to move the neck.  Also c/o terrible cramping in the hands and is so bad that has to stop eating and put down the fork.  08/22/17 update: Patient is seen today in follow-up for early Parkinson's disease.  He was started on levodopa last visit.  He was having some nausea with medication  despite eating with it and we gave him a small prescription for Zofran.  He states today that he never got that but he is taking it with bread and a slice of jelly and he is doing better.  He definitely notices that levodopa helps with the tremor.  He notices that when it wears off, he will start to feel wiggly, which is independent of tremor.  He notices that when he takes the levodopa, that will go away.  He can tell when the medication is wearing off.  He is still having hand cramping but those are mostly related to starting cars and squeezing the breaks.  He doesn't have those when he doesn't work on the weekends.  He does have nighttime leg cramps. He was complaining about neck pain last visit.  An MRI of the cervical spine was completed.  This demonstrated C5-C6 degenerative changes with facet edema.  There was also  multilevel neuroforaminal stenosis and moderate to severe left C3-C4, moderate left C4-C5 and moderate left C5-C6 neural foraminal stenosis.  He is finding that he is a bit wiggly in the mid afternoon.   We offered physical therapy and if that was not helpful to see a neurosurgeon.  He initially did not want anything, but ultimately called back for physical therapy.  He states that his neck is doing better.  All of the therapy is a little overwhelming with his job but he tries to do his job at home.  Wife states that he is depressed about this but patient denies this.  Wife states that falling on sleep a lot.  However, used to walk the dog a lot and enjoyed that but dog died.  Found that he is more deconditioned now that he is no longer exercising.  He is going to [employer].    11/28/17 update: Patient is seen today in follow-up for Parkinson's disease. This patient is accompanied in the office by his spouse who supplements the history. He is on carbidopa/levodopa 25/100, 1 tablet 3 times per day.  Mirtazapine was started last visit for mild depression.  Reports today that he is sleeping better.  Mood has been "some better" but wife isn't convinced.  Mood depends mostly on the job.  Pt denies falls.  Pt denies lightheadedness, near syncope.  No hallucinations.   Did go to PT for neck pain.  He is doing one on one training at [employer].  He just started that and has only been there 2 times.  He would eventually like to get back to [employer].    04/10/18 update: Patient is seen today in follow-up for Parkinson's disease.   Patient is on carbidopa/levodopa 25/100, 1 tablet 3 times per day.  His biggest issues he has is in the morning with tremulousness and stiffness.  Having cramping at night and RLS symptoms.  Overall, though, feels well.  He is still working at the [employer].   He enjoys that.  No problems with that.  No falls.  No lightheadedness or near syncope.  No hallucinations.  He is on mirtazapine for mood.  Saw  dermatology at [facility] since our last visit.  Had biopsy and was not melanoma.  Has another appt later this week.  Doing [employer] one day per week and doing personal trainer there one day a week.  Loves that.  08/21/2018 update: Patient follows up today for Parkinson's disease.  He is on carbidopa/levodopa 25/100, 1 tablet 3 times per day.  I added carbidopa/levodopa  50/200 at bedtime last visit for cramping at night and to help with first morning on.  He states today that he is doing much better and feels that the medication is working.  He has had no falls.  No lightheadedness or near syncope.  No hallucinations.  On mirtazapine for sleep and states that he is sleeping well.  States that mood has been good - "I enjoyed the holidays."  In regards to memory, states that he is having trouble with peoples names.  He has no trouble with getting lost or remembering to take meds (usually).  He carries a pill box with him to work and takes medication there without problem.  He does not yet wish to repeat neurocognitive testing, last done in 2017.  Records are reviewed since last visit.  He was in the hospital in November for a surgery on a left symptomatic varicocele.  He does think that this set him back a bit physically because today is first day back to gym and only one day has been back to [employer].    01/17/19 update: Patient seen today in follow-up.  He is on carbidopa/levodopa 25/100, 1 tablet 3 times per day and carbidopa/levodopa 50/200 at bedtime.  Pt denies falls.  Pt denies lightheadedness, near syncope.  No hallucinations.  Mood has been good with mirtazapine, 15 mg at bedtime.  He just started back to work.  Riding stationary bike for exercise.  Has some weights at home as well.  Last derm appointment was since the pandemic - "I had to wait in the car."  Asks me about a sensation when he sits down in the evening and legs feel "heavy" and "tight" and gone if he got up and moved around.  Didn't notice it really  until he quit working during [place] and was sitting around more in the evening.  ALLERGIES:  No Known Allergies  CURRENT MEDICATIONS:  Outpatient Encounter Medications as of 01/17/2019  Medication Sig   b complex vitamins tablet Take 1 tablet by mouth daily.   carbidopa-levodopa (SINEMET CR) 50-200 MG tablet TAKE 1 TABLET BY MOUTH EVERYDAY AT BEDTIME   carbidopa-levodopa (SINEMET IR) 25-100 MG tablet TAKE 1 TABLET BY MOUTH THREE TIMES A DAY   Cholecalciferol (VITAMIN D3) 1000 UNITS CAPS Take 1 capsule by mouth every evening.   famotidine (PEPCID) 20 MG tablet Take 20 mg by mouth at bedtime. Takes pepcid ac   Glucosamine Sulfate 1000 MG CAPS Take by mouth 2 (two) times daily.   Magnesium 100 MG CAPS Take 1 capsule by mouth every evening.   mirtazapine (REMERON) 15 MG tablet Take 1 tablet (15 mg total) by mouth at bedtime.   Multiple Vitamins-Minerals (VITABASIC SENIOR PO) Take 1 tablet by mouth.   Potassium 99 MG TABS Take by mouth.   [DISCONTINUED] mirtazapine (REMERON) 15 MG tablet TAKE 1 TABLET BY MOUTH EVERYDAY AT BEDTIME   No facility-administered encounter medications on file as of 01/17/2019.     PAST MEDICAL HISTORY:   Past Medical History:  Diagnosis Date   BPH (benign prostatic hyperplasia)    Foley catheter in place    removed after turp   GERD (gastroesophageal reflux disease)    Nocturnal leg cramps    occ   Urinary retention    Wears glasses     PAST SURGICAL HISTORY:   Past Surgical History:  Procedure Laterality Date   CATARACT EXTRACTION W/ INTRAOCULAR LENS  IMPLANT, BILATERAL Bilateral    INGUINAL HERNIA REPAIR Bilateral RIGHT  x 2 5852 umbilical done with last right   left last one done 10 yrs ago   RE-DO RIGHT INGUINAL HERNIA REPAIR AND UMBILICAL HERNIA REPAIR  2001   REMOVAL LEFT EYE INTRAOCULAR LENS AND REPLACEMENT   08-29-2013   TRANSURETHRAL RESECTION OF PROSTATE N/A 09/23/2013   Procedure: TRANSURETHRAL RESECTION OF THE PROSTATE (TURP)  WITH GYRUS;  Surgeon: Claybon Jabs, MD;  Location: [place];  Service: Urology;  Laterality: N/A;   VARICOCELECTOMY Left 07/02/2018   Procedure: VARICOCELECTOMY;  Surgeon: Kathie Rhodes, MD;  Location: [facility];  Service: Urology;  Laterality: Left;    SOCIAL HISTORY:   Social History   Socioeconomic History   Marital status: Married    Spouse name: Not on file   Number of children: Not on file   Years of education: Not on file   Highest education level: Not on file  Occupational History   Occupation: Chief Financial Officer    Comment: did Chief Financial Officer and IT work; now works at [employer]: Not on file   Food insecurity:    Worry: Not on file    Inability: Not on Lexicographer needs:    Medical: Not on file    Non-medical: Not on file  Tobacco Use   Smoking status: Never Smoker   Smokeless tobacco: Never Used  Substance and Sexual Activity   Alcohol use: No    Alcohol/week: 0.0 standard drinks   Drug use: No   Sexual activity: Not on file  Lifestyle   Physical activity:    Days per week: Not on file    Minutes per session: Not on file   Stress: Not on file  Relationships   Social connections:    Talks on phone: Not on file    Gets together: Not on file    Attends religious service: Not on file    Active member of club or organization: Not on file    Attends meetings of clubs or organizations: Not on file    Relationship status: Not on file   Intimate partner violence:    Fear of current or ex partner: Not on file    Emotionally abused: Not on file    Physically abused: Not on file    Forced sexual activity: Not on file  Other Topics Concern   Not on file  Social History Narrative   Not on file    FAMILY HISTORY:   Family Status  Relation Name Status   Mother  Deceased       CHF   Father  Deceased       DM, heart attack   Son  Alive       healthy   Son  Alive        healthy   Son  Alive       healthy    ROS:  Review of Systems  Constitutional: Negative.   HENT: Negative.   Eyes: Negative.   Respiratory: Negative.   Cardiovascular: Negative.   Musculoskeletal: Negative.   Skin: Negative.     PHYSICAL EXAMINATION:    VITALS:   Vitals:   01/17/19 1046  BP: 132/83  Pulse: 77  Temp: 98.5 F (36.9 C)  SpO2: 97%  Weight: 170 lb (77.1 kg)  Height: 5\' 10"  (1.778 m)    GEN:  The patient appears stated age and is in NAD. HEENT:  Normocephalic, atraumatic.  The mucous membranes are  moist. The superficial temporal arteries are without ropiness or tenderness. CV:  RRR Lungs:  CTAB Neck/HEME:  There are no carotid bruits bilaterally.  Neurological examination:  Orientation: The patient is alert and oriented x3. Cranial nerves: There is good facial symmetry. The speech is fluent and clear. Soft palate rises symmetrically and there is no tongue deviation. Hearing is intact to conversational tone. Sensation: Sensation is intact to light touch throughout.  Vibration is decreased distally Motor: Strength is 5/5 in the bilateral upper and lower extremities.   Shoulder shrug is equal and symmetric.  There is no pronator drift.  Movement examination: Tone: There is normal tone in the upper and lower extremities. Abnormal movements: There is intermittent right upper extremity resting tremor Coordination:  There is no decremation with rapid alternating movements. Gait and Station: The patient easily arises out of the chair without the use of his hands.  He walks well in the hall.  He does have a positive pull test.  Labs:   Lab Results  Component Value Date   TSH 1.11 11/30/2015   Lab Results  Component Value Date   VITAMINB12 466 11/30/2015   Lab Results  Component Value Date   FOLATE 18.8 11/30/2015     ASSESSMENT/PLAN:  1.  Idiopathic Parkinson's disease  -He will continue carbidopa/levodopa 25/100 three times per  day  -continue Carbidopa/levodopa 50/200 at bedtime.  This has helped with first morning on and nighttime cramps.  -he is following with dermatology.  Last appt was march or April, 2020.  -sensation in legs may be PN and may be RLS.  Decided not to change his medication today, as he only noticed it when he stopped working when the pandemic hip.  He is back to work.  He will keep an eye on this and let me know if it becomes bothersome. 2.  Gait instability  -Patient feels a little bit more unstable since his recent surgery, but is looking forward to getting back to exercise with his personal trainer and rock steady boxing.  He thinks once he does that, he will do better and I agree with that. 3.  Cerebral small vessel disease  -if able and no hx of GI bleed, recommend ASA, 81 mg EC daily.  States that he is taking this three times per week.   4.  Neck pain  -Cervical spine MRI demonstrated evidence of neural foraminal stenosis.  Physical therapy has helped. 5.  Memory change  -He had neuropsych testing with Dr. Si Raider on 07/19/2016.  Results were largely normal, with some evidence of mild cognitive impairment. Having more word finding issues and may need to repeat in the future.  We have been holding off on repeating as wife has not been here the last few visits to discuss her perception of memory.  We talked extensively today about repeating that testing.  He is going to talk to his wife about this.  I don't think that he has dementia.   6. Mild depression  -Doing well with mirtazapine, 15 mg at night.  This was refilled today.   7.  Follow-up in 5 months, sooner should new neurologic issues arise.  Greater than 50% of the 25-minute visit was spent in counseling.

## 2019-01-17 ENCOUNTER — Other Ambulatory Visit: Payer: Self-pay

## 2019-01-17 ENCOUNTER — Ambulatory Visit: Payer: Medicare Other | Admitting: Neurology

## 2019-01-17 ENCOUNTER — Encounter: Payer: Self-pay | Admitting: Neurology

## 2019-01-17 VITALS — BP 132/83 | HR 77 | Temp 98.5°F | Ht 70.0 in | Wt 170.0 lb

## 2019-01-17 DIAGNOSIS — G2581 Restless legs syndrome: Secondary | ICD-10-CM

## 2019-01-17 DIAGNOSIS — G20 Parkinson's disease: Secondary | ICD-10-CM

## 2019-01-17 MED ORDER — MIRTAZAPINE 15 MG PO TABS: 15.0000 mg | ORAL_TABLET | Freq: Every day | ORAL | 1 refills | Status: DC

## 2019-01-17 NOTE — Patient Instructions (Signed)
Good to see you today!  Keep an eye on those legs and let me know if the sensation is still like that when you get back to work.  It is either a bit of restless leg or neuropathy.  The physicians and staff at [facility] are committed to providing excellent care. You may receive a survey requesting feedback about your experience at our office. We strive to receive "very good" responses to the survey questions. If you feel that your experience would prevent you from giving the office a "very good " response, please contact our office to try to remedy the situation. We may be reached at [phone]. Thank you for taking the time out of your busy day to complete the survey.

## 2019-03-29 ENCOUNTER — Other Ambulatory Visit: Payer: Self-pay | Admitting: Neurology

## 2019-03-29 NOTE — Telephone Encounter (Signed)
Requested Prescriptions   Pending Prescriptions Disp Refills  . carbidopa-levodopa (SINEMET CR) 50-200 MG tablet [Pharmacy Med Name: CARBIDOPA-LEVO ER 50-200 TAB] 90 tablet 1    Sig: TAKE 1 TABLET BY MOUTH EVERYDAY AT BEDTIME   Rx last filled: 10/02/18 #90 1 refills  Pt last seen: 01/17/19   Follow up appt scheduled: 07/02/19

## 2019-06-24 NOTE — Progress Notes (Signed)
Joshua Browning was seen today in follow up for Parkinsons disease.   Pt denies lightheadedness, near syncope.  No hallucinations.  Mood has been fair.  He is on mirtazapine for sleep and doing well with that.   He is noting that leg cramping is disturbing his sleep.  He fell out of the bed last night but was awake and was trying to get out of the bed because the leg was cramping.   His last dermatology appointment was yesterday.  He follows yearly.  He is back at work starting at the beginning of may.  They are wearing masks.  Having trouble with exercise because gyms were closed and personal trainer had stopped training at that gym.  Noting hoarseness in the voice at the end of the day and possibly SOB at the end of the work day.  He isn't sure if it is SOB.  Still with abnormal sensation in the lower legs where socks are - only when rests at night and not when at work in the day.  Concerned about weight loss.  Was during the pandemic but doesn't think ate differently.  Did work out a bit more.    Current prescribed movement disorder medications: Carbidopa/levodopa 25/100, 1 tablet 3 times per day Carbidopa/levodopa 50/200 at bedtime Mirtazapine, 15 mg at bedtime  ALLERGIES:  No Known Allergies  CURRENT MEDICATIONS:  Outpatient Encounter Medications as of 06/25/2019  Medication Sig   b complex vitamins tablet Take 1 tablet by mouth daily.   carbidopa-levodopa (SINEMET CR) 50-200 MG tablet TAKE 1 TABLET BY MOUTH EVERYDAY AT BEDTIME   carbidopa-levodopa (SINEMET IR) 25-100 MG tablet TAKE 1 TABLET BY MOUTH THREE TIMES A DAY   Cholecalciferol (VITAMIN D3) 1000 UNITS CAPS Take 1 capsule by mouth every evening.   famotidine (PEPCID) 20 MG tablet Take 20 mg by mouth at bedtime. Takes pepcid ac   Glucosamine Sulfate 1000 MG CAPS Take by mouth 2 (two) times daily.   Magnesium 100 MG CAPS Take 1 capsule by mouth every evening.   mirtazapine (REMERON) 15 MG tablet Take 1 tablet (15 mg total)  by mouth at bedtime.   Multiple Vitamins-Minerals (VITABASIC SENIOR PO) Take 1 tablet by mouth.   Potassium 99 MG TABS Take by mouth.   No facility-administered encounter medications on file as of 06/25/2019.     PAST MEDICAL HISTORY:   Past Medical History:  Diagnosis Date   BPH (benign prostatic hyperplasia)    Foley catheter in place    removed after turp   GERD (gastroesophageal reflux disease)    Nocturnal leg cramps    occ   Urinary retention    Wears glasses     PAST SURGICAL HISTORY:   Past Surgical History:  Procedure Laterality Date   CATARACT EXTRACTION W/ INTRAOCULAR LENS  IMPLANT, BILATERAL Bilateral    INGUINAL HERNIA REPAIR Bilateral RIGHT x 2 Q000111Q umbilical done with last right   left last one done 10 yrs ago   RE-DO RIGHT INGUINAL HERNIA REPAIR AND UMBILICAL HERNIA REPAIR  2001   REMOVAL LEFT EYE INTRAOCULAR LENS AND REPLACEMENT   08-29-2013   TRANSURETHRAL RESECTION OF PROSTATE N/A 09/23/2013   Procedure: TRANSURETHRAL RESECTION OF THE PROSTATE (TURP) WITH GYRUS;  Surgeon: Claybon Jabs, MD;  Location: [facility];  Service: Urology;  Laterality: N/A;   VARICOCELECTOMY Left 07/02/2018   Procedure: VARICOCELECTOMY;  Surgeon: Kathie Rhodes, MD;  Location: [facility];  Service: Urology;  Laterality:  Left;    SOCIAL HISTORY:   Social History   Socioeconomic History   Marital status: Married    Spouse name: Not on file   Number of children: Not on file   Years of education: Not on file   Highest education level: Not on file  Occupational History   Occupation: Chief Financial Officer    Comment: did Chief Financial Officer and IT work; now works at [employer]: Not on file   Food insecurity    Worry: Not on file    Inability: Not on Lexicographer needs    Medical: Not on file    Non-medical: Not on file  Tobacco Use   Smoking status: Never Smoker   Smokeless tobacco: Never  Used  Substance and Sexual Activity   Alcohol use: No    Alcohol/week: 0.0 standard drinks   Drug use: No   Sexual activity: Not on file  Lifestyle   Physical activity    Days per week: Not on file    Minutes per session: Not on file   Stress: Not on file  Relationships   Social connections    Talks on phone: Not on file    Gets together: Not on file    Attends religious service: Not on file    Active member of club or organization: Not on file    Attends meetings of clubs or organizations: Not on file    Relationship status: Not on file   Intimate partner violence    Fear of current or ex partner: Not on file    Emotionally abused: Not on file    Physically abused: Not on file    Forced sexual activity: Not on file  Other Topics Concern   Not on file  Social History Narrative   Not on file    FAMILY HISTORY:   Family Status  Relation Name Status   Mother  Deceased       CHF   Father  Deceased       DM, heart attack   Son  Alive       healthy   Son  Alive       healthy   Son  Alive       healthy    ROS:  Review of Systems  Constitutional: Positive for weight loss.  HENT: Negative.   Eyes: Negative.   Respiratory: Negative.   Cardiovascular: Negative.   Gastrointestinal: Negative.   Genitourinary: Negative.   Musculoskeletal: Negative.   Skin: Negative.   Neurological: Positive for tingling.    PHYSICAL EXAMINATION:    VITALS:   Vitals:   06/25/19 0959  BP: (!) 154/84  Pulse: 76  Resp: 14  Temp: 97.8 F (36.6 C)  SpO2: 99%  Weight: 168 lb 9.6 oz (76.5 kg)  Height: 5\' 10"  (1.778 m)   Wt Readings from Last 3 Encounters:  06/25/19 168 lb 9.6 oz (76.5 kg)  01/17/19 170 lb (77.1 kg)  08/21/18 180 lb (81.6 kg)     GEN:  The patient appears stated age and is in NAD. HEENT:  Normocephalic, atraumatic.  The mucous membranes are moist. The superficial temporal arteries are without ropiness or tenderness. CV:  RRR Lungs:   CTAB Neck/HEME:  There are no carotid bruits bilaterally.  Neurological examination:  Orientation: The patient is alert and oriented x3. Cranial nerves: There is good facial symmetry with facial hypomimia. The speech is fluent and clear. Soft palate  rises symmetrically and there is no tongue deviation. Hearing is intact to conversational tone. Sensation: Sensation is intact to light touch throughout Motor: Strength is at least antigravity x4.  Movement examination: Tone: There is normal tone in the UE/LE Abnormal movements: there is mild rest tremor in the RUE Coordination:  There is  decremation with RAM's, only with  alternation of supination/pronation of the forearm on the L Gait and Station: The patient has no difficulty arising out of a deep-seated chair without the use of the hands. The patient's stride length is good but he is dragging the L leg and he is a bit antalgic.    Labs: I got a copy of lab work from primary care.  Lab work was dated January 15, 2019.  Glucose was 93.  Last CBC was in 2018.  Last CMP was in 2018.   ASSESSMENT/PLAN:  1.  Idiopathic Parkinson's disease             -He will continue carbidopa/levodopa 25/100 three times per day             -continue Carbidopa/levodopa 50/200 at bedtime.  This initially helped with cramping but getting worse again.  Doing labs today.  -add klonopin - 0.5 mg - 1/2 tablet at bedtime for the cramping that levodopa is not fulling helping with.  Hope it will help his sleep as well.  Discussed risk, benefits, and side effects including potential addictive nature.  Patient expressed understanding.             -he is following with dermatology.  Last appt was yesterday (06/2019) at [facility] dermatology.             -sensation in legs may be PN and may be RLS.  Decided not to change his medication today, as he only noticed it when he is seated.  Will add labs including b12, folate, spep/upep  -TSH, CMP will be done since has not been  done in quite some time for medication monitoring  -he didn't want to do online RSB but is on the list for in person RSB  -information given on ACT as I think that he would do well with them  -recommend PT and referral sent today. 2.  Gait instability             -Patient feels a little bit more unstable since his recent surgery, but is looking forward to getting back to exercise with his personal trainer and rock steady boxing.  He thinks once he does that, he will do better and I agree with that. 3.  Cerebral small vessel disease             -if able and no hx of GI bleed, recommend ASA, 81 mg EC daily.  States that he is taking this three times per week.   4.  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  Physical therapy has helped. 5.  Memory change             -He had neuropsych testing with Dr. Si Raider on 07/19/2016.  Results were largely normal, with some evidence of mild cognitive impairment. Having more word finding issues and may need to repeat in the future.  We have been holding off on repeating as wife has not been here the last few visits to discuss her perception of memory.  We talked extensively today about repeating  that testing.  He is going to talk to his wife about this.  I don't think that he has dementia.   6. Mild depression             -Doing well with mirtazapine, 15 mg at night.   7.  SOB  -told him that hoarseness may be from PD but told him SOB wouldn't be.   8.  Weight loss  -told needed to f/u with PCP.  Not related to PD.  Pt feels that unrelated to covid and being home at during covid.  Back at work now. 9.  Follow up is anticipated in the next 4-6 months, sooner should new neurologic issues arise.  Much greater than 50% of this visit was spent in counseling and coordinating care.  Total face to face time:  40 min  Cc:  Lawerance Cruel, MD

## 2019-06-25 ENCOUNTER — Other Ambulatory Visit: Payer: Medicare Other

## 2019-06-25 ENCOUNTER — Ambulatory Visit: Payer: Medicare Other | Admitting: Neurology

## 2019-06-25 ENCOUNTER — Encounter: Payer: Self-pay | Admitting: Neurology

## 2019-06-25 ENCOUNTER — Other Ambulatory Visit: Payer: Self-pay

## 2019-06-25 VITALS — BP 154/84 | HR 76 | Temp 97.8°F | Resp 14 | Ht 70.0 in | Wt 168.6 lb

## 2019-06-25 DIAGNOSIS — G2581 Restless legs syndrome: Secondary | ICD-10-CM | POA: Diagnosis not present

## 2019-06-25 DIAGNOSIS — G609 Hereditary and idiopathic neuropathy, unspecified: Secondary | ICD-10-CM

## 2019-06-25 DIAGNOSIS — G20 Parkinson's disease: Secondary | ICD-10-CM | POA: Diagnosis not present

## 2019-06-25 DIAGNOSIS — R634 Abnormal weight loss: Secondary | ICD-10-CM

## 2019-06-25 DIAGNOSIS — Z5181 Encounter for therapeutic drug level monitoring: Secondary | ICD-10-CM

## 2019-06-25 DIAGNOSIS — R251 Tremor, unspecified: Secondary | ICD-10-CM | POA: Diagnosis not present

## 2019-06-25 DIAGNOSIS — R0602 Shortness of breath: Secondary | ICD-10-CM

## 2019-06-25 DIAGNOSIS — R6889 Other general symptoms and signs: Secondary | ICD-10-CM

## 2019-06-25 MED ORDER — CLONAZEPAM 0.5 MG PO TABS: 0.2500 mg | ORAL_TABLET | Freq: Every day | ORAL | 1 refills | Status: DC

## 2019-06-25 NOTE — Patient Instructions (Addendum)
You have been referred to [facility] for therapy. They will call you directly to schedule an appointment.  Please call [phone] if you do not hear from them.   Your provider has requested that you have labwork completed today. Please go to [facility] ([id number]) on the second floor of this building before leaving the office today. You do not need to check in. If you are not called within 15 minutes please check with the front desk.   Start clonazepam - 0.5 mg - 1/2 tablet at night for the cramping

## 2019-06-27 LAB — PROTEIN ELECTROPHORESIS, SERUM
Albumin ELP: 4.2 g/dL (ref 3.8–4.8)
Alpha 1: 0.2 g/dL (ref 0.2–0.3)
Alpha 2: 0.7 g/dL (ref 0.5–0.9)
Beta 2: 0.3 g/dL (ref 0.2–0.5)
Beta Globulin: 0.3 g/dL — ABNORMAL LOW (ref 0.4–0.6)
Gamma Globulin: 0.7 g/dL — ABNORMAL LOW (ref 0.8–1.7)
Total Protein: 6.4 g/dL (ref 6.1–8.1)

## 2019-06-27 LAB — B12 AND FOLATE PANEL
Folate: 23.3 ng/mL
Vitamin B-12: 269 pg/mL (ref 200–1100)

## 2019-06-27 LAB — COMPREHENSIVE METABOLIC PANEL
AG Ratio: 2.4 (calc) (ref 1.0–2.5)
ALT: 16 U/L (ref 9–46)
AST: 21 U/L (ref 10–35)
Albumin: 4.5 g/dL (ref 3.6–5.1)
Alkaline phosphatase (APISO): 44 U/L (ref 35–144)
BUN: 16 mg/dL (ref 7–25)
CO2: 27 mmol/L (ref 20–32)
Calcium: 9.3 mg/dL (ref 8.6–10.3)
Chloride: 109 mmol/L (ref 98–110)
Creat: 0.86 mg/dL (ref 0.70–1.18)
Globulin: 1.9 g/dL (calc) (ref 1.9–3.7)
Glucose, Bld: 87 mg/dL (ref 65–99)
Potassium: 5 mmol/L (ref 3.5–5.3)
Sodium: 145 mmol/L (ref 135–146)
Total Bilirubin: 0.5 mg/dL (ref 0.2–1.2)
Total Protein: 6.4 g/dL (ref 6.1–8.1)

## 2019-06-27 LAB — TSH: TSH: 1.49 mIU/L (ref 0.40–4.50)

## 2019-06-28 LAB — PROTEIN ELECTRO, RANDOM URINE
Albumin ELP, Urine: 100 %
Alpha-1-Globulin, U: 0 %
Alpha-2-Globulin, U: 0 %
Beta Globulin, U: 0 %
Gamma Globulin, U: 0 %
Protein, Ur: 14.2 mg/dL

## 2019-06-28 LAB — IMMUNOFIXATION, SERUM
IgA/Immunoglobulin A, Serum: 92 mg/dL (ref 61–437)
IgG (Immunoglobin G), Serum: 777 mg/dL (ref 603–1613)
IgM (Immunoglobulin M), Srm: 34 mg/dL (ref 15–143)

## 2019-06-28 LAB — IMMUNOFIXATION, URINE

## 2019-07-02 ENCOUNTER — Ambulatory Visit: Payer: Medicare Other | Admitting: Neurology

## 2019-08-14 ENCOUNTER — Ambulatory Visit: Payer: Medicare Other | Attending: Neurology | Admitting: Physical Therapy

## 2019-08-14 ENCOUNTER — Other Ambulatory Visit: Payer: Self-pay

## 2019-08-14 DIAGNOSIS — M6281 Muscle weakness (generalized): Secondary | ICD-10-CM

## 2019-08-14 DIAGNOSIS — R2681 Unsteadiness on feet: Secondary | ICD-10-CM | POA: Insufficient documentation

## 2019-08-14 DIAGNOSIS — R2689 Other abnormalities of gait and mobility: Secondary | ICD-10-CM | POA: Diagnosis present

## 2019-08-14 DIAGNOSIS — R293 Abnormal posture: Secondary | ICD-10-CM

## 2019-08-15 ENCOUNTER — Ambulatory Visit: Payer: Medicare Other | Admitting: Physical Therapy

## 2019-08-15 ENCOUNTER — Encounter: Payer: Self-pay | Admitting: Physical Therapy

## 2019-08-15 DIAGNOSIS — M6281 Muscle weakness (generalized): Secondary | ICD-10-CM

## 2019-08-15 DIAGNOSIS — R2681 Unsteadiness on feet: Secondary | ICD-10-CM

## 2019-08-15 DIAGNOSIS — R2689 Other abnormalities of gait and mobility: Secondary | ICD-10-CM | POA: Diagnosis not present

## 2019-08-15 DIAGNOSIS — R293 Abnormal posture: Secondary | ICD-10-CM

## 2019-08-15 NOTE — Therapy (Signed)
[facility] [address] [place], [place], [address] Phone: [phone]   Fax:  [phone]  Physical Therapy Treatment  Patient Details  Name: Joshua Browning MRN: [id number] Date of Birth: Aug 07, 1946 Referring Provider (PT): Alonza Bogus, DO   Encounter Date: 08/15/2019  PT End of Session - 08/15/19 1627    Visit Number  2    Number of Visits  17    Date for PT Re-Evaluation  11/13/19    Authorization Type  [facility] need 10th visit progress note    PT Start Time  1510    PT Stop Time  1610    PT Time Calculation (min)  60 min    Activity Tolerance  Patient tolerated treatment well    Behavior During Therapy  [facility] for tasks assessed/performed       Past Medical History:  Diagnosis Date  . BPH (benign prostatic hyperplasia)   . Foley catheter in place    removed after turp  . GERD (gastroesophageal reflux disease)   . Nocturnal leg cramps    occ  . Urinary retention   . Wears glasses     Past Surgical History:  Procedure Laterality Date  . CATARACT EXTRACTION W/ INTRAOCULAR LENS  IMPLANT, BILATERAL Bilateral   . INGUINAL HERNIA REPAIR Bilateral RIGHT x 2 Q000111Q umbilical done with last right   left last one done 10 yrs ago  . RE-DO RIGHT INGUINAL HERNIA REPAIR AND UMBILICAL HERNIA REPAIR  2001  . REMOVAL LEFT EYE INTRAOCULAR LENS AND REPLACEMENT   08-29-2013  . TRANSURETHRAL RESECTION OF PROSTATE N/A 09/23/2013   Procedure: TRANSURETHRAL RESECTION OF THE PROSTATE (TURP) WITH GYRUS;  Surgeon: Claybon Jabs, MD;  Location: [facility];  Service: Urology;  Laterality: N/A;  . VARICOCELECTOMY Left 07/02/2018   Procedure: VARICOCELECTOMY;  Surgeon: Kathie Rhodes, MD;  Location: [facility];  Service: Urology;  Laterality: Left;    There were no vitals filed for this visit.  Subjective Assessment - 08/15/19 1511    Subjective  No changes since yesterday.    Patient Stated Goals  Pt's goal for therapy is to improve balance and posture.    Currently in Pain?  No/denies                       [facility] Adult PT Treatment/Exercise - 08/15/19 1518      Transfers   Transfers  Sit to Stand;Stand to Sit    Sit to Stand  5: Supervision;Without upper extremity assist;From bed   hands positioned on knees   Sit to Stand Details  Verbal cues for sequencing;Verbal cues for technique    Sit to Stand Details (indicate cue type and reason)  Cues for foot position, forward lean, upright posture in standing    Stand to Sit  5: Supervision;Without upper extremity assist;To bed    Stand to Sit Details (indicate cue type and reason)  Verbal cues for technique;Verbal cues for sequencing    Stand to Sit Details  Cues for "hinge" at hips to squat to lower to sit    Number of Reps  2 sets;Other reps (comment)   5 reps     Exercises   Exercises  Knee/Hip;Ankle      Knee/Hip Exercises: Stretches   Active Hamstring Stretch  Right;Left;3 reps;30 seconds    Active Hamstring Stretch Limitations  Cues for correct technique        [facility]) -  08/15/19 1552    PWR! exercises  Moves in sitting;Moves in standing    PWR! Up  x 10 reps at counter    PWR! Rock  x 10 reps at [employer]! Moves in standing, cues for technique, posture    PWR! Up  x 10 reps, with visual and tactile cues    PWR! Rock  x 5 reps each side, visual and verbal cues    Comments  PWR! Moves in sitting, with cues for technique and posture       With PWR! Up in standing, PT provides tactile cues for upright posture through scapular squeezes, knee extension; cues to avoid posterior lean with shoulders posterior to hips.  Balance Exercises - 08/15/19 1536      Balance Exercises: Standing   Wall Bumps  Hip;Eyes opened;10 reps   2 sets   Stepping Strategy  Anterior;Lateral;Posterior;UE support;10 reps   Cues for technique, step length, foot clearance, posture   Heel  Raises  Both;10 reps   2 sets   Toe Raise  Both;10 reps   2 sets   Other Standing Exercises  Lateral weightshifting wide BOS x 10 reps with UE support at counter; stagger stance forward/back rocking x 12 reps, with cues for technique for anterior/posterior weighshifiting with UE support      Cues provided between exercises for return to upright posture.  PT Education - 08/15/19 1626    Education Details  Initiated HEP-se instructions    Person(s) Educated  Patient    Methods  Explanation;Demonstration;Tactile cues;Verbal cues;Handout    Comprehension  Verbalized understanding;Returned demonstration;Need further instruction       PT Short Term Goals - 08/15/19 [id number]      PT SHORT TERM GOAL #1   Title  Pt will be independent with HEP for improved balance, posture, functional mobility.  TARGET 09/13/2019    Time  4    Period  Weeks    Status  New    Target Date  09/13/19      PT SHORT TERM GOAL #2   Title  Pt will improve 5x sit<>stand to less than or equal to 12.5 seconds for improved transfer efficiency and safety.    Time  4    Period  Weeks    Status  New    Target Date  09/13/19      PT SHORT TERM GOAL #3   Title  Pt will improve posterior step and release test to 2 steps or less with independent balance recovery, to demonstrate improved balance recovery in posterior direction.    Time  4    Period  Weeks    Status  New    Target Date  09/13/19      PT SHORT TERM GOAL #4   Title  Pt will improve MiniBESTest score to at least 15/28 for decreased fall risk.    Time  4    Period  Weeks    Status  New    Target Date  09/13/19      PT SHORT TERM GOAL #5   Title  Pt will verbalize understanding of fall prevention in home environment.    Time  4    Period  Weeks    Status  New    Target Date  09/13/19        PT Long Term Goals - 08/15/19 0828      PT LONG TERM GOAL #1   Title  Pt  will be independent with progression of HEP to address strength, balance, posture,  and mobility.  TARGET 10/11/2019    Time  8    Period  Weeks    Status  New    Target Date  10/11/19      PT LONG TERM GOAL #2   Title  Pt will improve gait velocity to at least 3 ft/sec for improved gait efficiency.    Time  8    Period  Weeks    Status  New    Target Date  10/11/19      PT LONG TERM GOAL #3   Title  Pt will improve MiniBESTest score to at least 19/28 for decreased fall risk.    Time  8    Period  Weeks    Status  New    Target Date  10/11/19      PT LONG TERM GOAL #4   Title  Pt will perform at least 8 of 10 reps of sit<>stand from <18" surface, minimal UE support, no LOB, for improved funcitonal strength with low surface transfers.    Time  8    Period  Weeks    Status  New    Target Date  10/11/19      PT LONG TERM GOAL #5   Title  Pt will verbalize plans for community fitness upon d/c from PT.    Time  8    Period  Weeks    Status  New    Target Date  10/11/19            Plan - 08/15/19 1627    Clinical Impression Statement  HEP initiated today to address sit<>stand, hamstring flexibility, posture and heel/toe raises for balance strategy work.  Throughout session, also worked on [employer], hip/ankle/step strategy work, posture exercises.  Pt requires multi-modal cues throughout for correct technique, for improved posture, weigthshift and to lessen posterior lean.  Intiiated PWR! Moves in standing and in sititng, but pt will need additional practice for optimal performance.    Personal Factors and Comorbidities  Comorbidity 2    Comorbidities  BPH, GERD    Examination-Activity Limitations  Stand;Locomotion Level;Transfers    Examination-Participation Restrictions  [employer] fitness, work   Stability/Clinical Decision Making  Stable/Uncomplicated    Rehab Potential  Good    PT Frequency  2x / week    PT Duration  8 weeks   including eval week   PT Treatment/Interventions  ADLs/Self Care Home Management;Gait  training;Functional mobility training;Therapeutic activities;Therapeutic exercise;Balance training;Neuromuscular re-education;DME Instruction;Patient/family education    PT Next Visit Plan  Review HEP, continue to work on weigthshfiting, balance strategies, sit<>stand for functional strenghtening; PWR! Moves in sitting, standing (check pt's schedule, as he wasn't sure about scheduling due to schedule conflicts, so I'm not sure how far he is scheduled out)    Consulted and Agree with Plan of Care  Patient       Patient will benefit from skilled therapeutic intervention in order to improve the following deficits and impairments:  Abnormal gait, Difficulty walking, Decreased balance, Impaired flexibility, Decreased mobility, Decreased strength, Postural dysfunction  Visit Diagnosis: Unsteadiness on feet  Muscle weakness (generalized)  Abnormal posture     Problem List Patient Active Problem List   Diagnosis Date Noted  . BPH (benign prostatic hypertrophy) with urinary retention 09/23/2013    Ladiamond Gallina W. 08/15/2019, 4:33 PM Frazier Butt., PT  [facility] [address]  [place], [place], [address] Phone: [phone]   Fax:  [phone]  Name: Joshua Browning MRN: [id number] Date of Birth: 11/17/1945

## 2019-08-15 NOTE — Therapy (Signed)
[facility] [address] [place], [place], [address] Phone: [phone]   Fax:  [phone]  Physical Therapy Evaluation  Patient Details  Name: Joshua Browning MRN: [id number] Date of Birth: November 26, 1945 Referring Provider (PT): Alonza Bogus, DO   Encounter Date: 08/14/2019  PT End of Session - 08/15/19 0816    Visit Number  1    Number of Visits  17    Date for PT Re-Evaluation  11/13/19    Authorization Type  [place] need 10th visit progress note    PT Start Time  1608    PT Stop Time  1648    PT Time Calculation (min)  40 min    Activity Tolerance  Patient tolerated treatment well    Behavior During Therapy  [facility] for tasks assessed/performed       Past Medical History:  Diagnosis Date  . BPH (benign prostatic hyperplasia)   . Foley catheter in place    removed after turp  . GERD (gastroesophageal reflux disease)   . Nocturnal leg cramps    occ  . Urinary retention   . Wears glasses     Past Surgical History:  Procedure Laterality Date  . CATARACT EXTRACTION W/ INTRAOCULAR LENS  IMPLANT, BILATERAL Bilateral   . INGUINAL HERNIA REPAIR Bilateral RIGHT x 2 [id number] umbilical done with last right   left last one done 10 yrs ago  . RE-DO RIGHT INGUINAL HERNIA REPAIR AND UMBILICAL HERNIA REPAIR  2001  . REMOVAL LEFT EYE INTRAOCULAR LENS AND REPLACEMENT   08-29-2013  . TRANSURETHRAL RESECTION OF PROSTATE N/A 09/23/2013   Procedure: TRANSURETHRAL RESECTION OF THE PROSTATE (TURP) WITH GYRUS;  Surgeon: Claybon Jabs, MD;  Location: [facility];  Service: Urology;  Laterality: N/A;  . VARICOCELECTOMY Left 07/02/2018   Procedure: VARICOCELECTOMY;  Surgeon: Kathie Rhodes, MD;  Location: [facility];  Service: Urology;  Laterality: Left;    There were no vitals filed for this visit.   Subjective Assessment - 08/14/19 1711    Subjective  I was doing a lot of things  before COVID hit.  I was doing [employer] and going to a gym and Physiological scientist and doing some yoga.  I've been trying to do some things at home, but it's not the same.  Still working at the [employer].  I feel like I'm regressing, especially with balance, especially in the backwards direction.  Feel walking is more wobbly.  Had one fall out of bed, lost my hold of the bed and slipped.    Patient Stated Goals  Pt's goal for therapy is to improve balance and posture.    Currently in Pain?  No/denies         [facility] PT Assessment - 08/14/19 1716      Assessment   Medical Diagnosis  Parkinson's disease    Referring Provider (PT)  Alonza Bogus, DO    Onset Date/Surgical Date  06/25/19   MD visit     Precautions   Precautions  Fall      Balance Screen   Has the patient fallen in the past 6 months  Yes    How many times?  1   fell trying to get out of bed   Has the patient had a decrease in activity level because of a fear of falling?   No    Is the patient reluctant to leave their home because of a fear  of falling?   No      Home Environment   Living Environment  Private residence    Living Arrangements  Spouse/significant other    Available Help at Discharge  Family    Type of [place] to enter    Entrance Stairs-Number of Steps  6    Entrance Stairs-Rails  Right    [place]  Two level;Bed/bath upstairs;1/2 bath on main level    Alternate Level Stairs-Number of Steps  14    Alternate Level Stairs-Rails  Left    Home Equipment  None      Prior Function   Level of Independence  Independent    Vocation  Part time employment    Vocation Requirements  Works at [employer]  Was doing [employer]; worked out with Physiological scientist      Observation/Other Assessments   Focus on Therapeutic Outcomes (FOTO)   NA      Posture/Postural Control   Posture/Postural Control  Postural limitations    Postural Limitations  Rounded  Shoulders;Forward head    Posture Comments  Narrow BOS and trunk flexed in standing      ROM / Strength   AROM / PROM / Strength  Strength;AROM      AROM   Overall AROM Comments  Tightness noted in hamstrings bilaterally with seated quad MMT position and with knees flexed with gait (R>L)      Strength   Overall Strength  Within functional limits for tasks performed    Strength Assessment Site  Hip;Knee;Ankle    Right/Left Hip  Right;Left    Right Hip Flexion  4/5    Left Hip Flexion  4/5    Right/Left Knee  Right;Left    Right Knee Flexion  4/5    Right Knee Extension  4/5    Left Knee Flexion  4/5    Left Knee Extension  4/5    Right/Left Ankle  Right;Left    Right Ankle Dorsiflexion  3+/5    Left Ankle Dorsiflexion  3+/5      Transfers   Transfers  Sit to Stand;Stand to Sit    Sit to Stand  5: Supervision;Without upper extremity assist;From chair/3-in-1    Five time sit to stand comments   14.13   1 episode of having to sit back down during 5x STS   Stand to Sit  5: Supervision;Without upper extremity assist;To chair/3-in-1      Ambulation/Gait   Ambulation/Gait  Yes    Ambulation/Gait Assistance  6: Modified independent (Device/Increase time)    Ambulation Distance (Feet)  150 Feet    Assistive device  None    Gait Pattern  Step-through pattern;Decreased arm swing - right;Decreased arm swing - left;Decreased step length - right;Decreased step length - left;Right flexed knee in stance;Trendelenburg;Decreased trunk rotation;Narrow base of support    Ambulation Surface  Level;Indoor    Gait velocity  12.22 sec = 2.68 ft/sec      Standardized Balance Assessment   Standardized Balance Assessment  Timed Up and Go Test      Timed Up and Go Test   Normal TUG (seconds)  12.91    Manual TUG (seconds)  14.06    Cognitive TUG (seconds)  13.69    TUG Comments  Scores >13.5-15 seconds indicate increased fall risk.  (Previous TUG score at d/c 08/2016 was 7.94 sec)      High Level  Balance   High Level Balance Comments  MiniBESTest:  11/28 (Scores <22/28 indicate increased fall risk).  In static standing, pt noted to have narrow BOS and spontaneous LOB in posterior direction, multiple steps and therapist nearby to assist to recover.          Mini-BESTest: Balance Evaluation Systems Test  2005-2013 [place] [employer] [place]. All rights reserved. ________________________________________________________________________________________Anticipatory_________Subscore__4___/6 1. SIT TO STAND Instruction: "Cross your arms across your chest. Try not to use your hands unless you must.Do not let your legs lean against the back of the chair when you stand. Please stand up now." X(2) Normal: Comes to stand without use of hands and stabilizes independently. (1) Moderate: Comes to stand WITH use of hands on first attempt. (0) Severe: Unable to stand up from chair without assistance, OR needs several attempts with use of hands. 2. RISE TO TOES Instruction: "Place your feet shoulder width apart. Place your hands on your hips. Try to rise as high as you can onto your toes. I will count out loud to 3 seconds. Try to hold this pose for at least 3 seconds. Look straight ahead. Rise now." (2) Normal: Stable for 3 s with maximum height. X(1) Moderate: Heels up, but not full range (smaller than when holding hands), OR noticeable instability for 3 s. (0) Severe: < 3 s. 3. STAND ON ONE LEG Instruction: "Look straight ahead. Keep your hands on your hips. Lift your leg off of the ground behind you without touching or resting your raised leg upon your other standing leg. Stay standing on one leg as long as you can. Look straight ahead. Lift now." Left: Time in Seconds Trial 1:__11.81__Trial 2:___9.69__ (2) Normal: 20 s. X(1) Moderate: < 20 s. (0) Severe: Unable. Right: Time in Seconds Trial 1:__12.13___Trial 2:____3.41_ (2) Normal: 20 s. X(1) Moderate: < 20 s. (0) Severe:  Unable To score each side separately use the trial with the longest time. To calculate the sub-score and total score use the side [left or right] with the lowest numerical score [i.e. the worse side]. ______________________________________________________________________________________Reactive Postural Control___________Subscore:___0__/6 4. COMPENSATORY STEPPING CORRECTION- FORWARD Instruction: "Stand with your feet shoulder width apart, arms at your sides. Lean forward against my hands beyond your forward limits. When I let go, do whatever is necessary, including taking a step, to avoid a fall." (2) Normal: Recovers independently with a single, large step (second realignment step is allowed). (1) Moderate: More than one step used to recover equilibrium. X(0) Severe: No step, OR would fall if not caught, OR falls spontaneously. 5. COMPENSATORY STEPPING CORRECTION- BACKWARD Instruction: "Stand with your feet shoulder width apart, arms at your sides. Lean backward against my hands beyond your backward limits. When I let go, do whatever is necessary, including taking a step, to avoid a fall." (2) Normal: Recovers independently with a single, large step. (1) Moderate: More than one step used to recover equilibrium. X(0) Severe: No step, OR would fall if not caught, OR falls spontaneously. 6. COMPENSATORY STEPPING CORRECTION- LATERAL Instruction: "Stand with your feet together, arms down at your sides. Lean into my hand beyond your sideways limit. When I let go, do whatever is necessary, including taking a step, to avoid a fall." Left (2) Normal: Recovers independently with 1 step (crossover or lateral OK). X(1) Moderate: Several steps to recover equilibrium. (0) Severe: Falls, or cannot step. Right (2) Normal: Recovers independently with 1 step (crossover or lateral OK). (1) Moderate: Several steps to recover equilibrium. X(0) Severe: Falls, or cannot  step. Use the side with the lowest  score to calculate sub-score and total score. ____________________________________________________________________________________Sensory Orientation_____________Subscore:____1___/6 7. STANCE (FEET TOGETHER); EYES OPEN, FIRM SURFACE Instruction: "Place your hands on your hips. Place your feet together until almost touching. Look straight ahead. Be as stable and still as possible, until I say stop." Time in seconds:____10sec____ (2) Normal: 30 s. X(1) Moderate: < 30 s. (0) Severe: Unable. 8. STANCE (FEET TOGETHER); EYES CLOSED, FOAM SURFACE Instruction: "Step onto the foam. Place your hands on your hips. Place your feet together until almost touching. Be as stable and still as possible, until I say stop. I will start timing when you close your eyes." Time in seconds:________ (2) Normal: 30 s. (1) Moderate: < 30 s. X(0) Severe: Unable. 9. INCLINE- EYES CLOSED Instruction: "Step onto the incline ramp. Please stand on the incline ramp with your toes toward the top. Place your feet shoulder width apart and have your arms down at your sides. I will start timing when you close your eyes." Time in seconds:________ (2) Normal: Stands independently 30 s and aligns with gravity. (1) Moderate: Stands independently <30 s OR aligns with surface. X(0) Severe: Unable. _________________________________________________________________________________________Dynamic Gait ______Subscore_____6___/10 10. CHANGE IN GAIT SPEED Instruction: "Begin walking at your normal speed, when I tell you 'fast', walk as fast as you can. When I say 'slow', walk very slowly." (2) Normal: Significantly changes walking speed without imbalance. X(1) Moderate: Unable to change walking speed or signs of imbalance. (0) Severe: Unable to achieve significant change in walking speed AND signs of imbalance. Donald - HORIZONTAL Instruction: "Begin walking at your normal speed, when I say "right", turn your head and  look to the right. When I say "left" turn your head and look to the left. Try to keep yourself walking in a straight line." (2) Normal: performs head turns with no change in gait speed and good balance. X(1) Moderate: performs head turns with reduction in gait speed. (0) Severe: performs head turns with imbalance. 12. WALK WITH PIVOT TURNS Instruction: "Begin walking at your normal speed. When I tell you to 'turn and stop', turn as quickly as you can, face the opposite direction, and stop. After the turn, your feet should be close together." (2) Normal: Turns with feet close FAST (< 3 steps) with good balance. X(1) Moderate: Turns with feet close SLOW (>4 steps) with good balance. (0) Severe: Cannot turn with feet close at any speed without imbalance. 13. STEP OVER OBSTACLES Instruction: "Begin walking at your normal speed. When you get to the box, step over it, not around it and keep walking." (2) Normal: Able to step over box with minimal change of gait speed and with good balance. X(1) Moderate: Steps over box but touches box OR displays cautious behavior by slowing gait. (0) Severe: Unable to step over box OR steps around box. 14. TIMED UP & GO WITH DUAL TASK [3 METER WALK] Instruction TUG: "When I say 'Go', stand up from chair, walk at your normal speed across the tape on the floor, turn around, and come back to sit in the chair." Instruction TUG with Dual Task: "Count backwards by threes starting at ___. When I say 'Go', stand up from chair, walk at your normal speed across the tape on the floor, turn around, and come back to sit in the chair. Continue counting backwards the entire time." TUG: ________seconds; Dual Task TUG: ________seconds X(2) Normal: No noticeable change in sitting, standing or walking while backward counting  when compared to TUG without Dual Task. (1) Moderate: Dual Task affects either counting OR walking (>10%) when compared to the TUG without Dual Task. (0)  Severe: Stops counting while walking OR stops walking while counting. When scoring item 14, if subject's gait speed slows more than 10% between the TUG without and with a Dual Task the score should be decreased by a point. TOTAL SCORE: ______11_/28       Objective measurements completed on examination: See above findings.              PT Education - 08/15/19 0815    Education Details  Results from objective measures and comparisons from d/c from PT 08/2016; POC for PT; standing positions for wider/staggered foot position for improved static standing balance    Person(s) Educated  Patient    Methods  Explanation;Demonstration    Comprehension  Verbalized understanding;Returned demonstration;Need further instruction       PT Short Term Goals - 08/15/19 [id number]      PT SHORT TERM GOAL #1   Title  Pt will be independent with HEP for improved balance, posture, functional mobility.  TARGET 09/13/2019    Time  4    Period  Weeks    Status  New    Target Date  09/13/19      PT SHORT TERM GOAL #2   Title  Pt will improve 5x sit<>stand to less than or equal to 12.5 seconds for improved transfer efficiency and safety.    Time  4    Period  Weeks    Status  New    Target Date  09/13/19      PT SHORT TERM GOAL #3   Title  Pt will improve posterior step and release test to 2 steps or less with independent balance recovery, to demonstrate improved balance recovery in posterior direction.    Time  4    Period  Weeks    Status  New    Target Date  09/13/19      PT SHORT TERM GOAL #4   Title  Pt will improve MiniBESTest score to at least 15/28 for decreased fall risk.    Time  4    Period  Weeks    Status  New    Target Date  09/13/19      PT SHORT TERM GOAL #5   Title  Pt will verbalize understanding of fall prevention in home environment.    Time  4    Period  Weeks    Status  New    Target Date  09/13/19        PT Long Term Goals - 08/15/19 0828      PT LONG TERM  GOAL #1   Title  Pt will be independent with progression of HEP to address strength, balance, posture, and mobility.  TARGET 10/11/2019    Time  8    Period  Weeks    Status  New    Target Date  10/11/19      PT LONG TERM GOAL #2   Title  Pt will improve gait velocity to at least 3 ft/sec for improved gait efficiency.    Time  8    Period  Weeks    Status  New    Target Date  10/11/19      PT LONG TERM GOAL #3   Title  Pt will improve MiniBESTest score to at least 19/28 for decreased fall risk.  Time  8    Period  Weeks    Status  New    Target Date  10/11/19      PT LONG TERM GOAL #4   Title  Pt will perform at least 8 of 10 reps of sit<>stand from <18" surface, minimal UE support, no LOB, for improved funcitonal strength with low surface transfers.    Time  8    Period  Weeks    Status  New    Target Date  10/11/19      PT LONG TERM GOAL #5   Title  Pt will verbalize plans for community fitness upon d/c from PT.    Time  8    Period  Weeks    Status  New    Target Date  10/11/19             Plan - 08/15/19 0817    Clinical Impression Statement  Pt is a 74 year old male who presents to OPPT with history of Parkinson's disease, with reports of worsening balance and steadiness with gait.  He is known to this clinic from previous therapy approximately 3 years ago.  He presents today with decreased lower extremity strength, abnormal posture, decreased balance, slowed mobility measures compared to d/c 08/2016.  He is at fall risk per MiniBESTest score of 11/28.  He demonstrates posterior LOB in static standing positions on solid surface, compliant surface and incline surface.  With reactive postural control (push and release), he demonstrates multiple steps, needing therapist assist to regain balance in posterior, anterior, and R lateral direcitons.  He remains active at his part-time job and enjoyed exercise classes/personal trainer sessions prior to COVID-19 pandemic.  He  will benefit from skilled PT to address the above stated deficits to decrease fall risk, improved overall functional mobility and gait.    Personal Factors and Comorbidities  Comorbidity 2    Comorbidities  BPH, GERD    Examination-Activity Limitations  Stand;Locomotion Level;Transfers    Examination-Participation Restrictions  [employer] fitness, work   [employer]  Stable/Uncomplicated    Clinical Decision Making  Low    Rehab Potential  Good    PT Frequency  2x / week    PT Duration  8 weeks   including eval week   PT Treatment/Interventions  ADLs/Self Care Home Management;Gait training;Functional mobility training;Therapeutic activities;Therapeutic exercise;Balance training;Neuromuscular re-education;DME Instruction;Patient/family education    PT Next Visit Plan  Initiate HEP-work on weightshifting, balance strategies, posture, sit<>stand for functional strengthening    Consulted and Agree with Plan of Care  Patient       Patient will benefit from skilled therapeutic intervention in order to improve the following deficits and impairments:  Abnormal gait, Difficulty walking, Decreased balance, Impaired flexibility, Decreased mobility, Decreased strength, Postural dysfunction  Visit Diagnosis: Other abnormalities of gait and mobility  Unsteadiness on feet  Muscle weakness (generalized)  Abnormal posture     Problem List Patient Active Problem List   Diagnosis Date Noted  . BPH (benign prostatic hypertrophy) with urinary retention 09/23/2013    Nayel Purdy W. 08/15/2019, 8:32 AM  Frazier Butt., PT  [facility] [address] [place], [place], [address] Phone: [phone]   Fax:  [phone]  Name: Joshua Browning MRN: [id number] Date of Birth: [birth year]

## 2019-08-15 NOTE — Patient Instructions (Signed)
Access Code: [email or web address]  URL: [email or web address]  Date: 08/15/2019  Prepared by: Mady Haagensen   Exercises Seated Hamstring Stretch - 3 reps - 1 sets - 30 sec hold - 2-3x daily - 5x weekly Sit to Stand with Hands on Knees - 5-10 reps - 2 sets - 1-2x daily - 7x weekly Heel Toe Raises with Counter Support - 10 reps - 1-2 sets - 1x daily - 7x weekly Seated Active Hip Flexion - 10 reps - 1 sets - 1x daily - 5x weekly

## 2019-08-20 ENCOUNTER — Other Ambulatory Visit: Payer: Self-pay

## 2019-08-20 ENCOUNTER — Ambulatory Visit: Payer: Medicare Other | Attending: Neurology | Admitting: Physical Therapy

## 2019-08-20 ENCOUNTER — Encounter: Payer: Self-pay | Admitting: Physical Therapy

## 2019-08-20 DIAGNOSIS — M6281 Muscle weakness (generalized): Secondary | ICD-10-CM

## 2019-08-20 DIAGNOSIS — R293 Abnormal posture: Secondary | ICD-10-CM

## 2019-08-20 DIAGNOSIS — R2689 Other abnormalities of gait and mobility: Secondary | ICD-10-CM | POA: Diagnosis present

## 2019-08-20 DIAGNOSIS — R2681 Unsteadiness on feet: Secondary | ICD-10-CM

## 2019-08-20 NOTE — Therapy (Signed)
[facility] [address] [place], [place], [address] Phone: [phone]   Fax:  [phone]  Physical Therapy Treatment  Patient Details  Name: Joshua Browning MRN: [id number] Date of Birth: Jan 05, 1946 Referring Provider (PT): Alonza Bogus, DO   Encounter Date: 08/20/2019  PT End of Session - 08/20/19 1246    Visit Number  3    Number of Visits  17    Date for PT Re-Evaluation  11/13/19    Authorization Type  [place] need 10th visit progress note    PT Start Time  1102    PT Stop Time  1144    PT Time Calculation (min)  42 min    Equipment Utilized During Treatment  Gait belt    Activity Tolerance  Patient tolerated treatment well    Behavior During Therapy  WFL for tasks assessed/performed       Past Medical History:  Diagnosis Date  . BPH (benign prostatic hyperplasia)   . Foley catheter in place    removed after turp  . GERD (gastroesophageal reflux disease)   . Nocturnal leg cramps    occ  . Urinary retention   . Wears glasses     Past Surgical History:  Procedure Laterality Date  . CATARACT EXTRACTION W/ INTRAOCULAR LENS  IMPLANT, BILATERAL Bilateral   . INGUINAL HERNIA REPAIR Bilateral RIGHT x 2 [id number] umbilical done with last right   left last one done 10 yrs ago  . RE-DO RIGHT INGUINAL HERNIA REPAIR AND UMBILICAL HERNIA REPAIR  2001  . REMOVAL LEFT EYE INTRAOCULAR LENS AND REPLACEMENT   08-29-2013  . TRANSURETHRAL RESECTION OF PROSTATE N/A 09/23/2013   Procedure: TRANSURETHRAL RESECTION OF THE PROSTATE (TURP) WITH GYRUS;  Surgeon: Claybon Jabs, MD;  Location: [facility];  Service: Urology;  Laterality: N/A;  . VARICOCELECTOMY Left 07/02/2018   Procedure: VARICOCELECTOMY;  Surgeon: Kathie Rhodes, MD;  Location: [facility];  Service: Urology;  Laterality: Left;    There were no vitals filed for this visit.  Subjective Assessment - 08/20/19 1105     Subjective  No falls since he was here last. Has ran through his exercises a couple evenings.    Patient Stated Goals  Pt's goal for therapy is to improve balance and posture.    Currently in Pain?  No/denies                    Pt performs PWR! Moves in standing position x 10 reps each:    PWR! Up for improved posture  PWR! Rock for improved weighshifting  PWR! Twist for improved trunk rotation   PWR! Step for improved step initiation   Demonstrative and verbal cues provided for posture, technique, and weight shifting. Chair placed anteriorly for intermittent UE support as needed.      [facility] Adult PT Treatment/Exercise - 08/20/19 1130      Transfers   Transfers  Sit to Stand;Stand to Sit    Sit to Stand  5: Supervision    Sit to Stand Details  Verbal cues for sequencing;Verbal cues for technique    Sit to Stand Details (indicate cue type and reason)  Cues for anterior weight shift, as pt as 2 episodes when he did not shift forward anteriorly and fell back into chair, cues for posture once in standing, 1 x 10 reps (slow)          Balance Exercises - 08/20/19 1248  Balance Exercises: Standing   Stepping Strategy  Posterior;UE support   cues for step length and posture    Other Standing Exercises  Stagger stance forward and backward weight shifting 1 x 10 reps B, verbal and demonstrative cues for posture and weight shifting single UE support at counter, with BUE suppot at counter heel/toe raises 1 x 10 reps           PT Short Term Goals - 08/15/19 0826      PT SHORT TERM GOAL #1   Title  Pt will be independent with HEP for improved balance, posture, functional mobility.  TARGET 09/13/2019    Time  4    Period  Weeks    Status  New    Target Date  09/13/19      PT SHORT TERM GOAL #2   Title  Pt will improve 5x sit<>stand to less than or equal to 12.5 seconds for improved transfer efficiency and safety.    Time  4    Period  Weeks    Status  New     Target Date  09/13/19      PT SHORT TERM GOAL #3   Title  Pt will improve posterior step and release test to 2 steps or less with independent balance recovery, to demonstrate improved balance recovery in posterior direction.    Time  4    Period  Weeks    Status  New    Target Date  09/13/19      PT SHORT TERM GOAL #4   Title  Pt will improve MiniBESTest score to at least 15/28 for decreased fall risk.    Time  4    Period  Weeks    Status  New    Target Date  09/13/19      PT SHORT TERM GOAL #5   Title  Pt will verbalize understanding of fall prevention in home environment.    Time  4    Period  Weeks    Status  New    Target Date  09/13/19        PT Long Term Goals - 08/15/19 0828      PT LONG TERM GOAL #1   Title  Pt will be independent with progression of HEP to address strength, balance, posture, and mobility.  TARGET 10/11/2019    Time  8    Period  Weeks    Status  New    Target Date  10/11/19      PT LONG TERM GOAL #2   Title  Pt will improve gait velocity to at least 3 ft/sec for improved gait efficiency.    Time  8    Period  Weeks    Status  New    Target Date  10/11/19      PT LONG TERM GOAL #3   Title  Pt will improve MiniBESTest score to at least 19/28 for decreased fall risk.    Time  8    Period  Weeks    Status  New    Target Date  10/11/19      PT LONG TERM GOAL #4   Title  Pt will perform at least 8 of 10 reps of sit<>stand from <18" surface, minimal UE support, no LOB, for improved funcitonal strength with low surface transfers.    Time  8    Period  Weeks    Status  New    Target Date  10/11/19  PT LONG TERM GOAL #5   Title  Pt will verbalize plans for community fitness upon d/c from PT.    Time  8    Period  Weeks    Status  New    Target Date  10/11/19            Plan - 08/20/19 1253    Clinical Impression Statement  Focus of today's skilled session was sit <> stand technique, posture, weight shifting, and stepping  activities for balance. Pt required verbal and demonstrative cues throughout for proper technique of exercises and for posture throughout session. Pt able to perform PWR! Up, Rock, and Twist at edge of mat with no UE support - needed single UE support for step initiation. Pt will continue to benefit from skilled PT in order to progress towards LTGs.    Personal Factors and Comorbidities  Comorbidity 2    Comorbidities  BPH, GERD    Examination-Activity Limitations  Stand;Locomotion Level;Transfers    Examination-Participation Restrictions  [employer] fitness, work   Stability/Clinical Decision Making  Stable/Uncomplicated    Rehab Potential  Good    PT Frequency  2x / week    PT Duration  8 weeks   including eval week   PT Treatment/Interventions  ADLs/Self Care Home Management;Gait training;Functional mobility training;Therapeutic activities;Therapeutic exercise;Balance training;Neuromuscular re-education;DME Instruction;Patient/family education    PT Next Visit Plan  Review HEP, continue to work on weigthshfiting, balance strategies, sit<>stand for functional strenghtening; PWR! Moves in sitting, standing    Consulted and Agree with Plan of Care  Patient       Patient will benefit from skilled therapeutic intervention in order to improve the following deficits and impairments:  Abnormal gait, Difficulty walking, Decreased balance, Impaired flexibility, Decreased mobility, Decreased strength, Postural dysfunction  Visit Diagnosis: Unsteadiness on feet  Muscle weakness (generalized)  Abnormal posture  Other abnormalities of gait and mobility     Problem List Patient Active Problem List   Diagnosis Date Noted  . BPH (benign prostatic hypertrophy) with urinary retention 09/23/2013    Arliss Journey, PT, DPT  08/20/2019, 12:54 PM  [facility] [address] [place], [place], [address] Phone:  [phone]   Fax:  [phone]  Name: ALBERTH FEEBACK MRN: [id number] Date of Birth: 1946-01-17

## 2019-08-21 ENCOUNTER — Ambulatory Visit: Payer: Medicare Other | Admitting: Physical Therapy

## 2019-08-21 ENCOUNTER — Encounter: Payer: Self-pay | Admitting: Physical Therapy

## 2019-08-21 DIAGNOSIS — R293 Abnormal posture: Secondary | ICD-10-CM

## 2019-08-21 DIAGNOSIS — R2681 Unsteadiness on feet: Secondary | ICD-10-CM

## 2019-08-21 DIAGNOSIS — R2689 Other abnormalities of gait and mobility: Secondary | ICD-10-CM

## 2019-08-21 NOTE — Therapy (Signed)
[facility] [address] [place], [place], [address] Phone: [phone]   Fax:  [phone]  Physical Therapy Treatment  Patient Details  Name: MUNTASIR BETHUNE MRN: [id number] Date of Birth: 10-Jun-1946 Referring Provider (PT): Alonza Bogus, DO   Encounter Date: 08/21/2019  PT End of Session - 08/21/19 1834    Visit Number  4    Number of Visits  17    Date for PT Re-Evaluation  11/13/19    Authorization Type  [place] need 10th visit progress note    PT Start Time  [id number]    PT Stop Time  1830    PT Time Calculation (min)  50 min    Equipment Utilized During Treatment  Gait belt    Activity Tolerance  Patient tolerated treatment well    Behavior During Therapy  [facility] for tasks assessed/performed       Past Medical History:  Diagnosis Date  . BPH (benign prostatic hyperplasia)   . Foley catheter in place    removed after turp  . GERD (gastroesophageal reflux disease)   . Nocturnal leg cramps    occ  . Urinary retention   . Wears glasses     Past Surgical History:  Procedure Laterality Date  . CATARACT EXTRACTION W/ INTRAOCULAR LENS  IMPLANT, BILATERAL Bilateral   . INGUINAL HERNIA REPAIR Bilateral RIGHT x 2 [id number] umbilical done with last right   left last one done 10 yrs ago  . RE-DO RIGHT INGUINAL HERNIA REPAIR AND UMBILICAL HERNIA REPAIR  2001  . REMOVAL LEFT EYE INTRAOCULAR LENS AND REPLACEMENT   08-29-2013  . TRANSURETHRAL RESECTION OF PROSTATE N/A 09/23/2013   Procedure: TRANSURETHRAL RESECTION OF THE PROSTATE (TURP) WITH GYRUS;  Surgeon: Claybon Jabs, MD;  Location: [facility];  Service: Urology;  Laterality: N/A;  . VARICOCELECTOMY Left 07/02/2018   Procedure: VARICOCELECTOMY;  Surgeon: Kathie Rhodes, MD;  Location: [facility];  Service: Urology;  Laterality: Left;    There were no vitals filed for this visit.  Subjective Assessment - 08/21/19 1743     Subjective  Hands cramp up alot, worse driving over here than usual.    Patient Stated Goals  Pt's goal for therapy is to improve balance and posture.    Currently in Pain?  Yes    Pain Score  3     Pain Location  Hand    Pain Orientation  Right;Left    Pain Descriptors / Indicators  Cramping    Pain Type  Chronic pain    Pain Onset  More than a month ago    Pain Frequency  Intermittent    Aggravating Factors   after a long day of work    Pain Relieving Factors  rubbing them together                       [facility] Adult PT Treatment/Exercise - 08/21/19 0001      Transfers   Transfers  Sit to Stand;Stand to Sit    Sit to Stand  5: Supervision    Sit to Stand Details  Verbal cues for sequencing;Verbal cues for technique    Stand to Sit  5: Supervision;Without upper extremity assist;To bed      Ambulation/Gait   Ambulation/Gait  Yes    Ambulation/Gait Assistance  6: Modified independent (Device/Increase time)    Ambulation Distance (Feet)  150 Feet    Assistive device  None    Gait Pattern  Step-through pattern;Decreased arm swing - right;Decreased arm swing - left;Decreased step length - right;Decreased step length - left;Right flexed knee in stance;Trendelenburg;Decreased trunk rotation;Narrow base of support    Gait Comments  At end of session, cues for upright posture, for arm swing and foot clearance, step length.      Self-Care   Self-Care  Other Self-Care Comments    Other Self-Care Comments   Discussed medications management, in regards to pt's reports of typical evening hand cramps (Possibly dystonia based on pt's descriptions).  Pt reports taking 6:30 am PD meds after breakfast, 11:30 am PD meds with meal, pm PD meds at 5:30 (before dinner).  Pt seen today at 5:45 session, and had forgotten to take that dose of PD meds.  Discussed interaction of protein with PD medications; agrees to monitor/log his meds/eating/ cramping/dystonia symptoms.        [facility])  - 08/21/19 1757    PWR! exercises  Moves in sitting;Moves in standing    PWR! Up  x 10 reps     PWR! Rock  x 10 reps each side    PWR Step  x 10 reps each side    Comments  PWR! Moves in standing, with chair for support, mat behind patient.  Visual and verbal cues provided; once episode of posterior lean with PWR! Up    PWR! Up  x 10 reps    PWR! Rock  x 10 reps each side    PWR! Step  x 10 reps each side-single step out and in    Comments  PWR! Moves in sitting -with verbal and visual cues       Balance Exercises - 08/21/19 1832      Balance Exercises: Standing   Wall Bumps  Hip;Eyes opened;10 reps   2 sets, cues to pull hips anteriorly   Stepping Strategy  Posterior;Anterior;UE support;10 reps   Cues for widened BOS upon return to midline   Heel Raises  Both;10 reps   UE support at [employer]  Both;10 reps   UE support at chair   Other Standing Exercises  Trunk rotation in corner, widened BOS reaching across body to wall, x 10 reps each side        PT Education - 08/21/19 1833    Education Details  Discussed medication management in regards to Sinemet and protein interaction; as well as timing of Sinemet and pt's reported hand cramping(?dystonia)    Person(s) Educated  Patient    Methods  Explanation    Comprehension  Verbalized understanding       PT Short Term Goals - 08/15/19 [id number]      PT SHORT TERM GOAL #1   Title  Pt will be independent with HEP for improved balance, posture, functional mobility.  TARGET 09/13/2019    Time  4    Period  Weeks    Status  New    Target Date  09/13/19      PT SHORT TERM GOAL #2   Title  Pt will improve 5x sit<>stand to less than or equal to 12.5 seconds for improved transfer efficiency and safety.    Time  4    Period  Weeks    Status  New    Target Date  09/13/19      PT SHORT TERM GOAL #3   Title  Pt will improve posterior step and release test to 2 steps or less with  independent balance recovery, to demonstrate  improved balance recovery in posterior direction.    Time  4    Period  Weeks    Status  New    Target Date  09/13/19      PT SHORT TERM GOAL #4   Title  Pt will improve MiniBESTest score to at least 15/28 for decreased fall risk.    Time  4    Period  Weeks    Status  New    Target Date  09/13/19      PT SHORT TERM GOAL #5   Title  Pt will verbalize understanding of fall prevention in home environment.    Time  4    Period  Weeks    Status  New    Target Date  09/13/19        PT Long Term Goals - 08/15/19 0828      PT LONG TERM GOAL #1   Title  Pt will be independent with progression of HEP to address strength, balance, posture, and mobility.  TARGET 10/11/2019    Time  8    Period  Weeks    Status  New    Target Date  10/11/19      PT LONG TERM GOAL #2   Title  Pt will improve gait velocity to at least 3 ft/sec for improved gait efficiency.    Time  8    Period  Weeks    Status  New    Target Date  10/11/19      PT LONG TERM GOAL #3   Title  Pt will improve MiniBESTest score to at least 19/28 for decreased fall risk.    Time  8    Period  Weeks    Status  New    Target Date  10/11/19      PT LONG TERM GOAL #4   Title  Pt will perform at least 8 of 10 reps of sit<>stand from <18" surface, minimal UE support, no LOB, for improved funcitonal strength with low surface transfers.    Time  8    Period  Weeks    Status  New    Target Date  10/11/19      PT LONG TERM GOAL #5   Title  Pt will verbalize plans for community fitness upon d/c from PT.    Time  8    Period  Weeks    Status  New    Target Date  10/11/19            Plan - 08/21/19 1834    Clinical Impression Statement  Pt needs less manual/tactile cues today in static standing than when this therapist saw patient last week.  Cue of bringing hips anteriorly ("belly button forward) with PWR! Up in standing and with wall bumps appears to help pt establish better midline stance with less posterior  lean.  He will continue to beneift from skilled PT to address balance, gait, transfers and functional strengthening for improved mobility and decreased fall risk.    Personal Factors and Comorbidities  Comorbidity 2    Comorbidities  BPH, GERD    Examination-Activity Limitations  Stand;Locomotion Level;Transfers    Examination-Participation Restrictions  Banker fitness, work   Stability/Clinical Decision Making  Stable/Uncomplicated    Rehab Potential  Good    PT Frequency  2x / week    PT Duration  8 weeks   including eval week  PT Treatment/Interventions  ADLs/Self Care Home Management;Gait training;Functional mobility training;Therapeutic activities;Therapeutic exercise;Balance training;Neuromuscular re-education;DME Instruction;Patient/family education    PT Next Visit Plan  continue to work on weigthshfiting, balance strategies, sit<>stand for functional strenghtening; PWR! Moves in sitting, standing; add to HEP:  wall bumps, step strategies, trunk rotation in corner    Consulted and Agree with Plan of Care  Patient       Patient will benefit from skilled therapeutic intervention in order to improve the following deficits and impairments:  Abnormal gait, Difficulty walking, Decreased balance, Impaired flexibility, Decreased mobility, Decreased strength, Postural dysfunction  Visit Diagnosis: Unsteadiness on feet  Abnormal posture  Other abnormalities of gait and mobility     Problem List Patient Active Problem List   Diagnosis Date Noted  . BPH (benign prostatic hypertrophy) with urinary retention 09/23/2013    Deserae Jennings W. 08/21/2019, 6:37 PM Frazier Butt., PT  [facility] [address] [place], [place], [address] Phone: [phone]   Fax:  [phone]  Name: JEKHI MCNIVEN MRN: [id number] Date of Birth: [birth year]

## 2019-08-24 ENCOUNTER — Other Ambulatory Visit: Payer: Self-pay | Admitting: Neurology

## 2019-08-25 ENCOUNTER — Other Ambulatory Visit: Payer: Self-pay | Admitting: Neurology

## 2019-09-06 ENCOUNTER — Ambulatory Visit: Payer: Medicare Other | Admitting: Physical Therapy

## 2019-09-06 ENCOUNTER — Other Ambulatory Visit: Payer: Self-pay

## 2019-09-06 DIAGNOSIS — R293 Abnormal posture: Secondary | ICD-10-CM

## 2019-09-06 DIAGNOSIS — R2681 Unsteadiness on feet: Secondary | ICD-10-CM

## 2019-09-06 DIAGNOSIS — R2689 Other abnormalities of gait and mobility: Secondary | ICD-10-CM

## 2019-09-06 DIAGNOSIS — M6281 Muscle weakness (generalized): Secondary | ICD-10-CM

## 2019-09-07 NOTE — Therapy (Signed)
[facility] [address] [place], [place], [address] Phone: [phone]   Fax:  [phone]  Physical Therapy Treatment  Patient Details  Name: [name] MRN: [id number] Date of Birth: 1946/02/12 Referring Provider (PT): Alonza Bogus, DO   Encounter Date: 09/06/2019  PT End of Session - 09/07/19 0909    Visit Number  5    Number of Visits  17    Date for PT Re-Evaluation  11/13/19    Authorization Type  [facility] need 10th visit progress note    PT Start Time  [id number]    PT Stop Time  1616    PT Time Calculation (min)  43 min    Activity Tolerance  Patient tolerated treatment well    Behavior During Therapy  [facility] for tasks assessed/performed       Past Medical History:  Diagnosis Date  . BPH (benign prostatic hyperplasia)   . Foley catheter in place    removed after turp  . GERD (gastroesophageal reflux disease)   . Nocturnal leg cramps    occ  . Urinary retention   . Wears glasses     Past Surgical History:  Procedure Laterality Date  . CATARACT EXTRACTION W/ INTRAOCULAR LENS  IMPLANT, BILATERAL Bilateral   . INGUINAL HERNIA REPAIR Bilateral RIGHT x 2 [id number] umbilical done with last right   left last one done 10 yrs ago  . RE-DO RIGHT INGUINAL HERNIA REPAIR AND UMBILICAL HERNIA REPAIR  2001  . REMOVAL LEFT EYE INTRAOCULAR LENS AND REPLACEMENT   08-29-2013  . TRANSURETHRAL RESECTION OF PROSTATE N/A 09/23/2013   Procedure: TRANSURETHRAL RESECTION OF THE PROSTATE (TURP) WITH GYRUS;  Surgeon: Claybon Jabs, MD;  Location: [facility];  Service: Urology;  Laterality: N/A;  . VARICOCELECTOMY Left 07/02/2018   Procedure: VARICOCELECTOMY;  Surgeon: Kathie Rhodes, MD;  Location: [facility];  Service: Urology;  Laterality: Left;    There were no vitals filed for this visit.  Subjective Assessment - 09/06/19 1535    Subjective  Came right from work today. Rides a  stationary recumbent bike. No falls.    Patient Stated Goals  Pt's goal for therapy is to improve balance and posture.    Currently in Pain?  No/denies    Pain Onset  More than a month ago                       [facility] Adult PT Treatment/Exercise - 09/06/19 1616      Transfers   Transfers  Sit to Stand;Stand to Sit    Sit to Stand  5: Supervision    Sit to Stand Details  Verbal cues for technique;Verbal cues for sequencing    Sit to Stand Details (indicate cue type and reason)  when standing cues for weight shifting M/L before taking initial step       Ambulation/Gait   Ambulation/Gait  Yes    Ambulation/Gait Assistance  5: Supervision    Ambulation Distance (Feet)  115 Feet   x2 laps   Assistive device  None    Gait Pattern  Step-through pattern;Decreased arm swing - right;Decreased arm swing - left;Decreased step length - right;Decreased step length - left;Right flexed knee in stance;Trendelenburg;Decreased trunk rotation;Narrow base of support    Gait Comments  Requires cueing for posture maintenance and to increase arm swing and foot clearance        PWR ([facility]) - 09/06/19 1544  PWR! Up  x 10    PWR! Rock  x 10 both directions    PWR! Twist  x 10 both direction    PWR Step  x 10 both directions    Comments  PWR! moves in standing with multiple near LOB moments (always posteriorly); chair in front with mat behind for safety with verbal and visual cues provided by PT. RPE 7/10 at end of PWR! moves in standing.    PWR! Up  x 10    PWR! Rock  x 10 both directions   upper body rock/weight shift only due to B hip discomfort   PWR! Twist  x 10 both directions     PWR! Step  x 10 both directions    Comments  PWR! Moves in sitting performed with visual and verbal cueing        Balance Exercises - 09/06/19 1613      Balance Exercises: Standing   Wall Bumps  Hip;Eyes opened;Other (comment)   chair in front; x 10; cues for full anterior weight shift       PT  Education - 09/06/19 1604    Education Details  updated HEP with review of seated PWR! Moves    Person(s) Educated  Patient    Methods  Explanation;Demonstration;Verbal cues;Handout    Comprehension  Verbalized understanding;Returned demonstration       PT Short Term Goals - 08/15/19 0826      PT SHORT TERM GOAL #1   Title  Pt will be independent with HEP for improved balance, posture, functional mobility.  TARGET 09/13/2019    Time  4    Period  Weeks    Status  New    Target Date  09/13/19      PT SHORT TERM GOAL #2   Title  Pt will improve 5x sit<>stand to less than or equal to 12.5 seconds for improved transfer efficiency and safety.    Time  4    Period  Weeks    Status  New    Target Date  09/13/19      PT SHORT TERM GOAL #3   Title  Pt will improve posterior step and release test to 2 steps or less with independent balance recovery, to demonstrate improved balance recovery in posterior direction.    Time  4    Period  Weeks    Status  New    Target Date  09/13/19      PT SHORT TERM GOAL #4   Title  Pt will improve MiniBESTest score to at least 15/28 for decreased fall risk.    Time  4    Period  Weeks    Status  New    Target Date  09/13/19      PT SHORT TERM GOAL #5   Title  Pt will verbalize understanding of fall prevention in home environment.    Time  4    Period  Weeks    Status  New    Target Date  09/13/19        PT Long Term Goals - 08/15/19 0828      PT LONG TERM GOAL #1   Title  Pt will be independent with progression of HEP to address strength, balance, posture, and mobility.  TARGET 10/11/2019    Time  8    Period  Weeks    Status  New    Target Date  10/11/19      PT LONG TERM GOAL #  2   Title  Pt will improve gait velocity to at least 3 ft/sec for improved gait efficiency.    Time  8    Period  Weeks    Status  New    Target Date  10/11/19      PT LONG TERM GOAL #3   Title  Pt will improve MiniBESTest score to at least 19/28 for  decreased fall risk.    Time  8    Period  Weeks    Status  New    Target Date  10/11/19      PT LONG TERM GOAL #4   Title  Pt will perform at least 8 of 10 reps of sit<>stand from <18" surface, minimal UE support, no LOB, for improved funcitonal strength with low surface transfers.    Time  8    Period  Weeks    Status  New    Target Date  10/11/19      PT LONG TERM GOAL #5   Title  Pt will verbalize plans for community fitness upon d/c from PT.    Time  8    Period  Weeks    Status  New    Target Date  10/11/19            Plan - 09/07/19 0911    Clinical Impression Statement  When performing standing PWR! moves, pt with multiple near LOB (always posteriorly), cues throughout for posture and brining hips anterior. Performed sitting PWR! moves - given to pt as HEP, with pt able to perform correctly and safely. Addressed gait training with focus on large step lengths with posture. Will continue to progress towards LTGs.    Personal Factors and Comorbidities  Comorbidity 2    Comorbidities  BPH, GERD    Examination-Activity Limitations  Stand;Locomotion Level;Transfers    Examination-Participation Restrictions  [employer], work   Stability/Clinical Decision Making  Stable/Uncomplicated    Rehab Potential  Good    PT Frequency  2x / week    PT Duration  8 weeks   including eval week   PT Treatment/Interventions  ADLs/Self Care Home Management;Gait training;Functional mobility training;Therapeutic activities;Therapeutic exercise;Balance training;Neuromuscular re-education;DME Instruction;Patient/family education    PT Next Visit Plan  how did seated PWR! moves go at home? continue to work on [employer], balance strategies, sit<>stand for functional strenghtening; PWR! Moves in standing; add to HEP:  wall bumps, step strategies, trunk rotation in corner, gait training    PT Home Exercise Plan  seated PWR! moves, [id number]    Consulted and Agree with  Plan of Care  Patient       Patient will benefit from skilled therapeutic intervention in order to improve the following deficits and impairments:  Abnormal gait, Difficulty walking, Decreased balance, Impaired flexibility, Decreased mobility, Decreased strength, Postural dysfunction  Visit Diagnosis: Unsteadiness on feet  Abnormal posture  Other abnormalities of gait and mobility  Muscle weakness (generalized)     Problem List Patient Active Problem List   Diagnosis Date Noted  . BPH (benign prostatic hypertrophy) with urinary retention 09/23/2013    Arliss Journey, PT, DPT  09/07/2019, 9:16 AM  [facility] [address] [place], [place], [address] Phone: [phone]   Fax:  [phone]  Name: Joshua Browning MRN: [id number] Date of Birth: 10/08/1945

## 2019-09-10 ENCOUNTER — Ambulatory Visit: Payer: Medicare Other | Admitting: Physical Therapy

## 2019-09-10 ENCOUNTER — Other Ambulatory Visit: Payer: Self-pay

## 2019-09-10 DIAGNOSIS — R2689 Other abnormalities of gait and mobility: Secondary | ICD-10-CM

## 2019-09-10 DIAGNOSIS — M6281 Muscle weakness (generalized): Secondary | ICD-10-CM

## 2019-09-10 DIAGNOSIS — R2681 Unsteadiness on feet: Secondary | ICD-10-CM

## 2019-09-10 DIAGNOSIS — R293 Abnormal posture: Secondary | ICD-10-CM

## 2019-09-10 NOTE — Therapy (Signed)
[facility] [address] [place], [place], [address] Phone: [phone]   Fax:  [phone]  Physical Therapy Treatment  Patient Details  Name: Joshua Browning MRN: [id number] Date of Birth: 10/02/1945 Referring Provider (PT): Alonza Bogus, DO   Encounter Date: 09/10/2019  PT End of Session - 09/10/19 2054    Visit Number  6    Number of Visits  17    Date for PT Re-Evaluation  11/13/19    Authorization Type  [facility] need 10th visit progress note    PT Start Time  1735    PT Stop Time  1822    PT Time Calculation (min)  47 min    Activity Tolerance  Patient tolerated treatment well    Behavior During Therapy  [facility] for tasks assessed/performed       Past Medical History:  Diagnosis Date  . BPH (benign prostatic hyperplasia)   . Foley catheter in place    removed after turp  . GERD (gastroesophageal reflux disease)   . Nocturnal leg cramps    occ  . Urinary retention   . Wears glasses     Past Surgical History:  Procedure Laterality Date  . CATARACT EXTRACTION W/ INTRAOCULAR LENS  IMPLANT, BILATERAL Bilateral   . INGUINAL HERNIA REPAIR Bilateral RIGHT x 2 [id number] umbilical done with last right   left last one done 10 yrs ago  . RE-DO RIGHT INGUINAL HERNIA REPAIR AND UMBILICAL HERNIA REPAIR  2001  . REMOVAL LEFT EYE INTRAOCULAR LENS AND REPLACEMENT   08-29-2013  . TRANSURETHRAL RESECTION OF PROSTATE N/A 09/23/2013   Procedure: TRANSURETHRAL RESECTION OF THE PROSTATE (TURP) WITH GYRUS;  Surgeon: Claybon Jabs, MD;  Location: [facility];  Service: Urology;  Laterality: N/A;  . VARICOCELECTOMY Left 07/02/2018   Procedure: VARICOCELECTOMY;  Surgeon: Kathie Rhodes, MD;  Location: [facility];  Service: Urology;  Laterality: Left;    There were no vitals filed for this visit.  Subjective Assessment - 09/10/19 1738    Subjective  No falls. Tried the seated PWR!  moves for HEP.    Patient Stated Goals  Pt's goal for therapy is to improve balance and posture.    Currently in Pain?  No/denies    Pain Onset  More than a month ago                      [facility] Adult PT Treatment/Exercise - 09/10/19 0001      Transfers   Transfers  Sit to Stand;Stand to Sit    Sit to Stand  5: Supervision    Sit to Stand Details (indicate cue type and reason)  1 x 10 reps from standard arm chair sit <> stands with focus on eccentric control and tall posture in standing. 1 x 10 reps from smaller height chair with no arms with cues to push off with BUE and rock A/P before standing, pt with improved balance and no posterior LOB    Five time sit to stand comments    14 seconds with no UE support from standard chair - pt with decr eccentric control and a couple instances where pt shifted weight onto his heels        Therapeutic Activites    Therapeutic Activities  Other Therapeutic Activities    Other Therapeutic Activities  pt able to state fall prevention strategies in the home, also provided pt with handout for fall prevention,  pt states that he may have to look into getting grab bars in the bathroom        PWR [facility]) - 09/10/19 1800    PWR! Up  x10    PWR! Rock  x10 both directions    PWR! Twist  x10 both directions    PWR Step  x10 both directions    Comments  cues for posture, weight shift and "open" hands. performed at edge of mat table with chair anteriorly in case for balance        Balance Exercises - 09/10/19 2102      Balance Exercises: Standing   Wall Bumps  Hip;Eyes opened   3 x 10 reps, cues for weight shift   Other Standing Exercises  at countertop with single UE support: posterior stepping strategy 1 x 10 reps B, 1 x 10 reps alternating posterior stepping - cues to maintain wider BOS when stepping back to midline, added to HEP        PT Education - 09/10/19 1818    Education Details  fall prevention strategies, posterior stepping  and hip bump exercises to HEP    Person(s) Educated  Patient    Methods  Explanation;Demonstration;Handout    Comprehension  Verbalized understanding;Returned demonstration       PT Short Term Goals - 09/10/19 1739      PT SHORT TERM GOAL #1   Title  Pt will be independent with HEP for improved balance, posture, functional mobility.  TARGET 09/13/2019    Time  4    Period  Weeks    Status  New    Target Date  09/13/19      PT SHORT TERM GOAL #2   Title  Pt will improve 5x sit<>stand to less than or equal to 12.5 seconds for improved transfer efficiency and safety.    Baseline  14 seconds with no UE support from standard chair    Time  4    Period  Weeks    Status  Not Met    Target Date  09/13/19      PT SHORT TERM GOAL #3   Title  Pt will improve posterior step and release test to 2 steps or less with independent balance recovery, to demonstrate improved balance recovery in posterior direction.    Time  4    Period  Weeks    Status  New    Target Date  09/13/19      PT SHORT TERM GOAL #4   Title  Pt will improve MiniBESTest score to at least 15/28 for decreased fall risk.    Time  4    Period  Weeks    Status  New    Target Date  09/13/19      PT SHORT TERM GOAL #5   Title  Pt will verbalize understanding of fall prevention in home environment.    Baseline  able to verbalize understanding of fall prevention in his house - no throw rugs, night lights    Time  4    Period  Weeks    Status  Achieved    Target Date  09/13/19        PT Long Term Goals - 08/15/19 8527      PT LONG TERM GOAL #1   Title  Pt will be independent with progression of HEP to address strength, balance, posture, and mobility.  TARGET 10/11/2019    Time  8    Period  Weeks  Status  New    Target Date  10/11/19      PT LONG TERM GOAL #2   Title  Pt will improve gait velocity to at least 3 ft/sec for improved gait efficiency.    Time  8    Period  Weeks    Status  New    Target Date   10/11/19      PT LONG TERM GOAL #3   Title  Pt will improve MiniBESTest score to at least 19/28 for decreased fall risk.    Time  8    Period  Weeks    Status  New    Target Date  10/11/19      PT LONG TERM GOAL #4   Title  Pt will perform at least 8 of 10 reps of sit<>stand from <18" surface, minimal UE support, no LOB, for improved funcitonal strength with low surface transfers.    Time  8    Period  Weeks    Status  New    Target Date  10/11/19      PT LONG TERM GOAL #5   Title  Pt will verbalize plans for community fitness upon d/c from PT.    Time  8    Period  Weeks    Status  New    Target Date  10/11/19            Plan - 09/10/19 2056    Clinical Impression Statement  Pt able to perform standing PWR! moves today without any almost LOB posteriorly, less cueing for bringing hips anterior and for posture. Began checking STGs- pt did not meet STG #2 in regards to 5x sit <> stand - pt able to perform without BUE support from chair, but had decr eccentric control and had 2 instances where pt almost lost balance posteriorly. Practiced sit <> stands from lower surface with using BUE to push off from chair and rocking A/P before standing - with pt demonstrating improved balance. Will continue to progress towards LTGs.    Personal Factors and Comorbidities  Comorbidity 2    Comorbidities  BPH, GERD    Examination-Activity Limitations  Stand;Locomotion Level;Transfers    Examination-Participation Restrictions  [employer], work   Stability/Clinical Decision Making  Stable/Uncomplicated    Rehab Potential  Good    PT Frequency  2x / week    PT Duration  8 weeks   including eval week   PT Treatment/Interventions  ADLs/Self Care Home Management;Gait training;Functional mobility training;Therapeutic activities;Therapeutic exercise;Balance training;Neuromuscular re-education;DME Instruction;Patient/family education    PT Next Visit Plan  check remaining  STGs, review seated PWR! moves for HEP, continue to work on [employer], balance strategies, sit<>stand for functional strenghtening; PWR! Moves in standing; add to HEP: trunk rotation in corner, gait training with focus on arm swing and step length    PT Home Exercise Plan  seated PWR! moves, [id number]    Consulted and Agree with Plan of Care  Patient       Patient will benefit from skilled therapeutic intervention in order to improve the following deficits and impairments:  Abnormal gait, Difficulty walking, Decreased balance, Impaired flexibility, Decreased mobility, Decreased strength, Postural dysfunction  Visit Diagnosis: Unsteadiness on feet  Abnormal posture  Other abnormalities of gait and mobility  Muscle weakness (generalized)     Problem List Patient Active Problem List   Diagnosis Date Noted  . BPH (benign prostatic hypertrophy) with urinary retention 09/23/2013  Arliss Journey, PT, DPT  09/10/2019, 9:05 PM  [facility] [address] [place], [place], [address] Phone: [phone]   Fax:  [phone]  Name: Joshua Browning MRN: [id number] Date of Birth: 04-Sep-1945

## 2019-09-10 NOTE — Patient Instructions (Addendum)
Access Code: [email or web address]  URL: [email or web address]  Date: 09/10/2019  Prepared by: Janann August   Program Notes  Wall bumps: standing at the wall with a chair in front of you for safety with both hands on chair, practicing bending forward from the hips and bumping your bottom into the wall and then using your hips - think belly button forward. 2 x 10 reps   Exercises Seated Hamstring Stretch - 3 reps - 1 sets - 30 sec hold - 2-3x daily - 5x weekly Sit to Stand with Hands on Knees - 5-10 reps - 2 sets - 1-2x daily - 7x weekly Heel Toe Raises with Counter Support - 10 reps - 1-2 sets - 1x daily - 7x weekly Seated Active Hip Flexion - 10 reps - 1 sets - 1x daily - 5x weekly Alternating Step Backward with Support - 10 reps - 2 sets - 1x daily - 5x weekly           Fall Prevention in the Home, Adult Falls can cause injuries. They can happen to people of all ages. There are many things you can do to make your home safe and to help prevent falls. Ask for help when making these changes, if needed. What actions can I take to prevent falls? General Instructions  Use good lighting in all rooms. Replace any light bulbs that burn out.  Turn on the lights when you go into a dark area. Use night-lights.  Keep items that you use often in easy-to-reach places. Lower the shelves around your home if necessary.  Set up your furniture so you have a clear path. Avoid moving your furniture around.  Do not have throw rugs and other things on the floor that can make you trip.  Avoid walking on wet floors.  If any of your floors are uneven, fix them.  Add color or contrast paint or tape to clearly mark and help you see: ? Any grab bars or handrails. ? First and last steps of stairways. ? Where the edge of each step is.  If you use a stepladder: ? Make sure that it is fully opened. Do not climb a closed stepladder. ? Make sure that both sides of the stepladder are locked into  place. ? Ask someone to hold the stepladder for you while you use it.  If there are any pets around you, be aware of where they are. What can I do in the bathroom?      Keep the floor dry. Clean up any water that spills onto the floor as soon as it happens.  Remove soap buildup in the tub or shower regularly.  Use non-skid mats or decals on the floor of the tub or shower.  Attach bath mats securely with double-sided, non-slip rug tape.  If you need to sit down in the shower, use a plastic, non-slip stool.  Install grab bars by the toilet and in the tub and shower. Do not use towel bars as grab bars. What can I do in the bedroom?  Make sure that you have a light by your bed that is easy to reach.  Do not use any sheets or blankets that are too big for your bed. They should not hang down onto the floor.  Have a firm chair that has side arms. You can use this for support while you get dressed. What can I do in the kitchen?  Clean up any spills right away.  If you need to reach something  above you, use a strong step stool that has a grab bar.  Keep electrical cords out of the way.  Do not use floor polish or wax that makes floors slippery. If you must use wax, use non-skid floor wax. What can I do with my stairs?  Do not leave any items on the stairs.  Make sure that you have a light switch at the top of the stairs and the bottom of the stairs. If you do not have them, ask someone to add them for you.  Make sure that there are handrails on both sides of the stairs, and use them. Fix handrails that are broken or loose. Make sure that handrails are as long as the stairways.  Install non-slip stair treads on all stairs in your home.  Avoid having throw rugs at the top or bottom of the stairs. If you do have throw rugs, attach them to the floor with carpet tape.  Choose a carpet that does not hide the edge of the steps on the stairway.  Check any carpeting to make sure that  it is firmly attached to the stairs. Fix any carpet that is loose or worn. What can I do on the outside of my home?  Use bright outdoor lighting.  Regularly fix the edges of walkways and driveways and fix any cracks.  Remove anything that might make you trip as you walk through a door, such as a raised step or threshold.  Trim any bushes or trees on the path to your home.  Regularly check to see if handrails are loose or broken. Make sure that both sides of any steps have handrails.  Install guardrails along the edges of any raised decks and porches.  Clear walking paths of anything that might make someone trip, such as tools or rocks.  Have any leaves, snow, or ice cleared regularly.  Use sand or salt on walking paths during winter.  Clean up any spills in your garage right away. This includes grease or oil spills. What other actions can I take?  Wear shoes that: ? Have a low heel. Do not wear high heels. ? Have rubber bottoms. ? Are comfortable and fit you well. ? Are closed at the toe. Do not wear open-toe sandals.  Use tools that help you move around (mobility aids) if they are needed. These include: ? Canes. ? Walkers. ? Scooters. ? Crutches.  Review your medicines with your doctor. Some medicines can make you feel dizzy. This can increase your chance of falling. Ask your doctor what other things you can do to help prevent falls. Where to find more information  Centers for Disease Control and Prevention, [employer]ADI: https://garcia.biz/  [employer] on Aging: BrainJudge.co.uk Contact a doctor if:  You are afraid of falling at home.  You feel weak, drowsy, or dizzy at home.  You fall at home. Summary  There are many simple things that you can do to make your home safe and to help prevent falls.  Ways to make your home safe include removing tripping hazards and installing grab bars in the bathroom.  Ask for help when making these changes in your  home. This information is not intended to replace advice given to you by your health care provider. Make sure you discuss any questions you have with your health care provider. Document Revised: 11/22/2018 Document Reviewed: 03/16/2017 Elsevier Patient Education  2020 [employer].

## 2019-09-11 ENCOUNTER — Encounter: Payer: Self-pay | Admitting: Physical Therapy

## 2019-09-11 ENCOUNTER — Ambulatory Visit: Payer: Medicare Other | Admitting: Physical Therapy

## 2019-09-11 DIAGNOSIS — R2681 Unsteadiness on feet: Secondary | ICD-10-CM | POA: Diagnosis not present

## 2019-09-11 DIAGNOSIS — R2689 Other abnormalities of gait and mobility: Secondary | ICD-10-CM

## 2019-09-12 NOTE — Therapy (Signed)
[facility] [address] [place], [place], [address] Phone: [phone]   Fax:  [phone]  Physical Therapy Treatment  Patient Details  Name: Joshua Browning MRN: [id number] Date of Birth: [birth year] Referring Provider (PT): Alonza Bogus, DO   Encounter Date: 09/11/2019  PT End of Session - 09/12/19 1852    Visit Number  7    Number of Visits  17    Date for PT Re-Evaluation  11/13/19    Authorization Type  [employer] need 10th visit progress note    PT Start Time  1705    PT Stop Time  1745    PT Time Calculation (min)  40 min    Activity Tolerance  Patient tolerated treatment well    Behavior During Therapy  [facility] for tasks assessed/performed       Past Medical History:  Diagnosis Date  . BPH (benign prostatic hyperplasia)   . Foley catheter in place    removed after turp  . GERD (gastroesophageal reflux disease)   . Nocturnal leg cramps    occ  . Urinary retention   . Wears glasses     Past Surgical History:  Procedure Laterality Date  . CATARACT EXTRACTION W/ INTRAOCULAR LENS  IMPLANT, BILATERAL Bilateral   . INGUINAL HERNIA REPAIR Bilateral RIGHT x 2 6256 umbilical done with last right   left last one done 10 yrs ago  . RE-DO RIGHT INGUINAL HERNIA REPAIR AND UMBILICAL HERNIA REPAIR  2001  . REMOVAL LEFT EYE INTRAOCULAR LENS AND REPLACEMENT   08-29-2013  . TRANSURETHRAL RESECTION OF PROSTATE N/A 09/23/2013   Procedure: TRANSURETHRAL RESECTION OF THE PROSTATE (TURP) WITH GYRUS;  Surgeon: Claybon Jabs, MD;  Location: [facility];  Service: Urology;  Laterality: N/A;  . VARICOCELECTOMY Left 07/02/2018   Procedure: VARICOCELECTOMY;  Surgeon: Kathie Rhodes, MD;  Location: [facility];  Service: Urology;  Laterality: Left;    There were no vitals filed for this visit.  Subjective Assessment - 09/11/19 1708    Subjective  No changes, no falls.  Things about  the same-unsteady at times.    Patient Stated Goals  Pt's goal for therapy is to improve balance and posture.    Currently in Pain?  No/denies    Pain Onset  More than a month ago                       [facility] Adult PT Treatment/Exercise - 09/12/19 0001      Transfers   Transfers  Sit to Stand;Stand to Sit    Sit to Stand  5: Supervision;Without upper extremity assist;From chair/3-in-1    Stand to Sit  5: Supervision;Without upper extremity assist;To bed    Comments  Performed sit<>stand at least 5 reps from mat surface, cues for increased forward lean; on last rep, pt has difficulty with full sit>stand and sits back down, with increased posterior lean.      Ambulation/Gait   Ambulation/Gait  Yes    Ambulation/Gait Assistance  5: Supervision    Ambulation Distance (Feet)  115 Feet   x 2, additional 60 ft   Assistive device  None    Gait Pattern  Step-through pattern;Decreased arm swing - right;Decreased arm swing - left;Decreased step length - right;Decreased step length - left;Right flexed knee in stance;Trendelenburg;Decreased trunk rotation;Narrow base of support    Ambulation Surface  Level;Indoor         REviewed  HEP: Program Notes  Wall bumps: standing at the wall with a chair in front of you for safety with both hands on chair, practicing bending forward from the hips and bumping your bottom into the wall and then using your hips - think belly button forward. 2 x 10 reps   Exercises Seated Hamstring Stretch - 3 reps - 1 sets - 30 sec hold - 2-3x daily - 5x weekly Sit to Stand with Hands on Knees - 5-10 reps - 2 sets - 1-2x daily - 7x weekly Heel Toe Raises with Counter Support - 10 reps - 1-2 sets - 1x daily - 7x weekly Seated Active Hip Flexion - 10 reps - 1 sets - 1x daily - 5x weekly Alternating Step Backward with Support - 10 reps - 2 sets - 1x daily - 5x weekly  Pt requires cues for technique with above exercises, particularly alternating step  backwards, heel toe raises, wall bumps.  Neuro Re-education:  REviewe HEP given last visit: Pt performs PWR! Moves in seated position x 10 reps   PWR! Up for improved posture  PWR! Rock for improved weighshifting  PWR! Twist for improved trunk rotation   PWR! Step for improved step initiation   Cues provided for technique and pacing for exercises above.  Mini-BESTest: Balance Evaluation Systems Test  2005-2013 [employer]. All rights reserved. ________________________________________________________________________________________Anticipatory_________Subscore__5___/6 1. SIT TO STAND Instruction: "Cross your arms across your chest. Try not to use your hands unless you must.Do not let your legs lean against the back of the chair when you stand. Please stand up now." X(2) Normal: Comes to stand without use of hands and stabilizes independently. (1) Moderate: Comes to stand WITH use of hands on first attempt. (0) Severe: Unable to stand up from chair without assistance, OR needs several attempts with use of hands. 2. RISE TO TOES Instruction: "Place your feet shoulder width apart. Place your hands on your hips. Try to rise as high as you can onto your toes. I will count out loud to 3 seconds. Try to hold this pose for at least 3 seconds. Look straight ahead. Rise now." X(2) Normal: Stable for 3 s with maximum height. (1) Moderate: Heels up, but not full range (smaller than when holding hands), OR noticeable instability for 3 s. (0) Severe: < 3 s. 3. STAND ON ONE LEG Instruction: "Look straight ahead. Keep your hands on your hips. Lift your leg off of the ground behind you without touching or resting your raised leg upon your other standing leg. Stay standing on one leg as long as you can. Look straight ahead. Lift now." Left: Time in Seconds Trial 1:_3.78____Trial 2:__9.75___ (2) Normal: 20 s. X(1) Moderate: < 20 s. (0) Severe: Unable. Right: Time in Seconds  Trial 1:_13.16____Trial 2:___3.12__ (2) Normal: 20 s. X(1) Moderate: < 20 s. (0) Severe: Unable To score each side separately use the trial with the longest time. To calculate the sub-score and total score use the side [left or right] with the lowest numerical score [i.e. the worse side]. ______________________________________________________________________________________Reactive Postural Control___________Subscore:___4__/6 4. COMPENSATORY STEPPING CORRECTION- FORWARD Instruction: "Stand with your feet shoulder width apart, arms at your sides. Lean forward against my hands beyond your forward limits. When I let go, do whatever is necessary, including taking a step, to avoid a fall." X(2) Normal: Recovers independently with a single, large step (second realignment step is allowed). (1) Moderate: More than one step used to recover equilibrium. (0) Severe: No step, OR would  fall if not caught, OR falls spontaneously. 5. COMPENSATORY STEPPING CORRECTION- BACKWARD Instruction: "Stand with your feet shoulder width apart, arms at your sides. Lean backward against my hands beyond your backward limits. When I let go, do whatever is necessary, including taking a step, to avoid a fall." (2) Normal: Recovers independently with a single, large step. X(1) Moderate: More than one step used to recover equilibrium. (0) Severe: No step, OR would fall if not caught, OR falls spontaneously. 6. COMPENSATORY STEPPING CORRECTION- LATERAL Instruction: "Stand with your feet together, arms down at your sides. Lean into my hand beyond your sideways limit. When I let go, do whatever is necessary, including taking a step, to avoid a fall." Left (2) Normal: Recovers independently with 1 step (crossover or lateral OK). X(1) Moderate: Several steps to recover equilibrium. (0) Severe: Falls, or cannot step. Right (2) Normal: Recovers independently with 1 step (crossover or lateral OK). (1) Moderate: Several steps to  recover equilibrium. X(0) Severe: Falls, or cannot step. Use the side with the lowest score to calculate sub-score and total score. ____________________________________________________________________________________Sensory Orientation_____________Subscore:_______4__/6 7. STANCE (FEET TOGETHER); EYES OPEN, FIRM SURFACE Instruction: "Place your hands on your hips. Place your feet together until almost touching. Look straight ahead. Be as stable and still as possible, until I say stop." Time in seconds:________ X(2) Normal: 30 s. (1) Moderate: < 30 s. (0) Severe: Unable. 8. STANCE (FEET TOGETHER); EYES CLOSED, FOAM SURFACE Instruction: "Step onto the foam. Place your hands on your hips. Place your feet together until almost touching. Be as stable and still as possible, until I say stop. I will start timing when you close your eyes." Time in seconds:________ (2) Normal: 30 s. X(1) Moderate: < 30 s. (0) Severe: Unable. 9. INCLINE- EYES CLOSED Instruction: "Step onto the incline ramp. Please stand on the incline ramp with your toes toward the top. Place your feet shoulder width apart and have your arms down at your sides. I will start timing when you close your eyes." Time in seconds:________ (2) Normal: Stands independently 30 s and aligns with gravity. X(1) Moderate: Stands independently <30 s OR aligns with surface. (0) Severe: Unable. _________________________________________________________________________________________Dynamic Gait ______Subscore_____4___/10 10. CHANGE IN GAIT SPEED Instruction: "Begin walking at your normal speed, when I tell you 'fast', walk as fast as you can. When I say 'slow', walk very slowly." (2) Normal: Significantly changes walking speed without imbalance. X(1) Moderate: Unable to change walking speed or signs of imbalance. (0) Severe: Unable to achieve significant change in walking speed AND signs of imbalance. [place] -  HORIZONTAL Instruction: "Begin walking at your normal speed, when I say "right", turn your head and look to the right. When I say "left" turn your head and look to the left. Try to keep yourself walking in a straight line." (2) Normal: performs head turns with no change in gait speed and good balance. X(1) Moderate: performs head turns with reduction in gait speed. (0) Severe: performs head turns with imbalance. 12. WALK WITH PIVOT TURNS Instruction: "Begin walking at your normal speed. When I tell you to 'turn and stop', turn as quickly as you can, face the opposite direction, and stop. After the turn, your feet should be close together." (2) Normal: Turns with feet close FAST (< 3 steps) with good balance. (1) Moderate: Turns with feet close SLOW (>4 steps) with good balance. X(0) Severe: Cannot turn with feet close at any speed without imbalance.-POSTERIOR LOB 13. STEP OVER OBSTACLES Instruction: "Begin  walking at your normal speed. When you get to the box, step over it, not around it and keep walking." (2) Normal: Able to step over box with minimal change of gait speed and with good balance. X(1) Moderate: Steps over box but touches box OR displays cautious behavior by slowing gait. (0) Severe: Unable to step over box OR steps around box. 14. TIMED UP & GO WITH DUAL TASK [3 METER WALK] Instruction TUG: "When I say 'Go', stand up from chair, walk at your normal speed across the tape on the floor, turn around, and come back to sit in the chair." Instruction TUG with Dual Task: "Count backwards by threes starting at ___. When I say 'Go', stand up from chair, walk at your normal speed across the tape on the floor, turn around, and come back to sit in the chair. Continue counting backwards the entire time." TUG: __11.38______seconds; Dual Task TUG: ____13.97____seconds (2) Normal: No noticeable change in sitting, standing or walking while backward counting when compared to TUG without Dual  Task. X(1) Moderate: Dual Task affects either counting OR walking (>10%) when compared to the TUG without Dual Task. (0) Severe: Stops counting while walking OR stops walking while counting. When scoring item 14, if subject's gait speed slows more than 10% between the TUG without and with a Dual Task the score should be decreased by a point. TOTAL SCORE: ____17____/28        PT Short Term Goals - 09/12/19 1847      PT SHORT TERM GOAL #1   Title  Pt will be independent with HEP for improved balance, posture, functional mobility.  TARGET 09/13/2019    Baseline  Needs occasional cues for technique; pt reports doing the exercises, but probably not as much as he should    Time  4    Period  Weeks    Status  Partially Met    Target Date  09/13/19      PT SHORT TERM GOAL #2   Title  Pt will improve 5x sit<>stand to less than or equal to 12.5 seconds for improved transfer efficiency and safety.    Baseline  14 seconds with no UE support from standard chair    Time  4    Period  Weeks    Status  Not Met    Target Date  09/13/19      PT SHORT TERM GOAL #3   Title  Pt will improve posterior step and release test to 2 steps or less with independent balance recovery, to demonstrate improved balance recovery in posterior direction.    Baseline  1 step to recover posterior direction    Time  4    Period  Weeks    Status  Achieved    Target Date  09/13/19      PT SHORT TERM GOAL #4   Title  Pt will improve MiniBESTest score to at least 15/28 for decreased fall risk.    Baseline  17/28 09/11/2019    Time  4    Period  Weeks    Status  Achieved    Target Date  09/13/19      PT SHORT TERM GOAL #5   Title  Pt will verbalize understanding of fall prevention in home environment.    Baseline  able to verbalize understanding of fall prevention in his house - no throw rugs, night lights    Time  4    Period  Weeks    Status  Achieved    Target Date  09/13/19        PT Long Term Goals -  08/15/19 0828      PT LONG TERM GOAL #1   Title  Pt will be independent with progression of HEP to address strength, balance, posture, and mobility.  TARGET 10/11/2019    Time  8    Period  Weeks    Status  New    Target Date  10/11/19      PT LONG TERM GOAL #2   Title  Pt will improve gait velocity to at least 3 ft/sec for improved gait efficiency.    Time  8    Period  Weeks    Status  New    Target Date  10/11/19      PT LONG TERM GOAL #3   Title  Pt will improve MiniBESTest score to at least 19/28 for decreased fall risk.    Time  8    Period  Weeks    Status  New    Target Date  10/11/19      PT LONG TERM GOAL #4   Title  Pt will perform at least 8 of 10 reps of sit<>stand from <18" surface, minimal UE support, no LOB, for improved funcitonal strength with low surface transfers.    Time  8    Period  Weeks    Status  New    Target Date  10/11/19      PT LONG TERM GOAL #5   Title  Pt will verbalize plans for community fitness upon d/c from PT.    Time  8    Period  Weeks    Status  New    Target Date  10/11/19            Plan - 09/12/19 1853    Clinical Impression Statement  Assessed remaining STGs this visit, wtih pt partially meeting STG 1 for HEP (he performs HEP with cues for technique and reports not performing as frequently as he should).  STG 2 not met; STG 3 and 4 met (improved posterior push and release, improved MiniBESTest).  STG 5 met for fall prevention.  Today, pt has difficulty with lateral push and release to R side, with increased number of steps and needing therapist assist to regain balance.  While pt has improved MiniBESTest score, he remains at fall risk and would continue to benefit from skilled PT towards LTGs for improved funcitonal moiblity and decreased fall risk.    Personal Factors and Comorbidities  Comorbidity 2    Comorbidities  BPH, GERD    Examination-Activity Limitations  Stand;Locomotion Level;Transfers     Examination-Participation Restrictions  [employer], work   Stability/Clinical Decision Making  Stable/Uncomplicated    Rehab Potential  Good    PT Frequency  2x / week    PT Duration  8 weeks   including eval week   PT Treatment/Interventions  ADLs/Self Care Home Management;Gait training;Functional mobility training;Therapeutic activities;Therapeutic exercise;Balance training;Neuromuscular re-education;DME Instruction;Patient/family education    PT Next Visit Plan  Continue to work on balance strategies (particularly for balance recovery in posterior and lateral directions), sit<>stand for functional strengthening; PWR! Moves in standing, work on compliant surfaces, gait training    PT Home Exercise Plan  seated PWR! moves, [id number]    Consulted and Agree with Plan of Care  Patient       Patient will benefit from skilled therapeutic intervention in order  to improve the following deficits and impairments:  Abnormal gait, Difficulty walking, Decreased balance, Impaired flexibility, Decreased mobility, Decreased strength, Postural dysfunction  Visit Diagnosis: Unsteadiness on feet  Other abnormalities of gait and mobility     Problem List Patient Active Problem List   Diagnosis Date Noted  . BPH (benign prostatic hypertrophy) with urinary retention 09/23/2013    Vashon Arch W. 09/12/2019, 7:00 PM  Frazier Butt., PT   [facility] [address] [place], [place], [address] Phone: [phone]   Fax:  [phone]  Name: Joshua Browning MRN: [id number] Date of Birth: 1946/05/21

## 2019-09-17 ENCOUNTER — Ambulatory Visit: Payer: Medicare Other | Attending: Neurology | Admitting: Physical Therapy

## 2019-09-17 ENCOUNTER — Other Ambulatory Visit: Payer: Self-pay

## 2019-09-17 ENCOUNTER — Encounter: Payer: Self-pay | Admitting: Physical Therapy

## 2019-09-17 DIAGNOSIS — R293 Abnormal posture: Secondary | ICD-10-CM | POA: Diagnosis present

## 2019-09-17 DIAGNOSIS — R2689 Other abnormalities of gait and mobility: Secondary | ICD-10-CM | POA: Diagnosis present

## 2019-09-17 DIAGNOSIS — R2681 Unsteadiness on feet: Secondary | ICD-10-CM | POA: Insufficient documentation

## 2019-09-18 ENCOUNTER — Ambulatory Visit: Payer: Medicare Other | Admitting: Physical Therapy

## 2019-09-18 ENCOUNTER — Encounter: Payer: Self-pay | Admitting: Physical Therapy

## 2019-09-18 DIAGNOSIS — R2681 Unsteadiness on feet: Secondary | ICD-10-CM | POA: Diagnosis not present

## 2019-09-18 DIAGNOSIS — R2689 Other abnormalities of gait and mobility: Secondary | ICD-10-CM

## 2019-09-18 DIAGNOSIS — R293 Abnormal posture: Secondary | ICD-10-CM

## 2019-09-18 NOTE — Therapy (Signed)
[facility] [address] [place], [place], [address] Phone: [phone]   Fax:  [phone]  Physical Therapy Treatment  Patient Details  Name: Joshua Browning MRN: [id number] Date of Birth: 06/02/1946 Referring Provider (PT): Rebecca Tat, DO   Encounter Date: 09/17/2019  PT End of Session - 09/18/19 0957    Visit Number  8    Number of Visits  17    Date for PT Re-Evaluation  11/13/19    Authorization Type  [employer] Medicare-will need 10th visit progress note    PT Start Time  1146    PT Stop Time  1228    PT Time Calculation (min)  42 min    Equipment Utilized During Treatment  Gait belt    Activity Tolerance  Patient tolerated treatment well    Behavior During Therapy  WFL for tasks assessed/performed       Past Medical History:  Diagnosis Date  . BPH (benign prostatic hyperplasia)   . Foley catheter in place    removed after turp  . GERD (gastroesophageal reflux disease)   . Nocturnal leg cramps    occ  . Urinary retention   . Wears glasses     Past Surgical History:  Procedure Laterality Date  . CATARACT EXTRACTION W/ INTRAOCULAR LENS  IMPLANT, BILATERAL Bilateral   . INGUINAL HERNIA REPAIR Bilateral RIGHT x 2 1984 umbilical done with last right   left last one done 10 yrs ago  . RE-DO RIGHT INGUINAL HERNIA REPAIR AND UMBILICAL HERNIA REPAIR  2001  . REMOVAL LEFT EYE INTRAOCULAR LENS AND REPLACEMENT   08-29-2013  . TRANSURETHRAL RESECTION OF PROSTATE N/A 09/23/2013   Procedure: TRANSURETHRAL RESECTION OF THE PROSTATE (TURP) WITH GYRUS;  Surgeon: Mark C Ottelin, MD;  Location: [facility];  Service: Urology;  Laterality: N/A;  . VARICOCELECTOMY Left 07/02/2018   Procedure: VARICOCELECTOMY;  Surgeon: Ottelin, Mark, MD;  Location: [facility];  Service: Urology;  Laterality: Left;    There were no vitals filed for this visit.  Subjective Assessment - 09/17/19 1151     Subjective  No changes, no falls.  Just frustrated due to having a little forgetfulness this morning-forgot my mask and my cell phone, but did get my grocery shopping done.    Patient Stated Goals  Pt's goal for therapy is to improve balance and posture.    Currently in Pain?  No/denies    Pain Onset  More than a month ago                       [facility] Adult PT Treatment/Exercise - 09/18/19 0949      Transfers   Transfers  Sit to Stand;Stand to Sit    Sit to Stand  5: Supervision;Without upper extremity assist;From bed    Sit to Stand Details  Verbal cues for technique;Verbal cues for sequencing    Stand to Sit  5: Supervision;Without upper extremity assist;To bed    Stand to Sit Details (indicate cue type and reason)  Verbal cues for technique;Verbal cues for sequencing    Number of Reps  10 reps   Additional 5 reps standing on balance beam   Transfer Cueing  Visual cue of chair in front, as target, bringing head close to chair for improved anterior lean to initiate sit to stand.  Pt with no posterior LOB with sit>stand with this cue today.            Balance Exercises - 09/18/19 0951      Balance Exercises: Standing   Balance Beam  Standing on balance beam in parallel bars:  plantarflexion>standing neutral on beam, 3 sets x 10 reps to encourage more forward posture and position; cues provided for bringing hips forward.  Forward step taps x 10 reps, then back step taps x 10 reps, then side step taps x 10 reps, with intermittent UE support, working on balance/step strategy work.      Other Standing Exercises  Four square step activity, working on foward>side>back>side step (then reverse sequence), x at least 5 reps.  Pt requires close supervision, cues for increased step length and step height for foot clearance, cues for widened BOS upon brief midline stance to regain balance.  After stepping strategy work on balance beam (see above), progressed to outside of parallel bars  with perturbations given at hip/shoulders, varied directions (forward/back/side), with pt able to evoke step strategy to regain balance each time (1-2 exceptions, where he reaches for parallel bars)          PT Short Term Goals - 09/12/19 1847      PT SHORT TERM GOAL #1   Title  Pt will be independent with HEP for improved balance, posture, functional mobility.  TARGET 09/13/2019    Baseline  Needs occasional cues for technique; pt reports doing the exercises, but probably not as much as he should    Time  4    Period  Weeks    Status  Partially Met    Target Date  09/13/19      PT SHORT TERM GOAL #2   Title  Pt will improve 5x sit<>stand to less than or equal to 12.5 seconds for improved transfer efficiency and safety.    Baseline  14 seconds with no UE support from standard chair    Time  4    Period  Weeks    Status  Not Met    Target Date  09/13/19      PT SHORT TERM GOAL #3   Title  Pt will improve posterior step and release test to 2 steps or less with independent balance recovery, to demonstrate improved balance recovery in posterior direction.    Baseline  1 step to recover posterior direction    Time  4    Period  Weeks    Status  Achieved    Target Date  09/13/19      PT SHORT TERM GOAL #4   Title  Pt will improve MiniBESTest score to at least 15/28 for decreased fall risk.    Baseline  17/28 09/11/2019    Time  4    Period  Weeks    Status  Achieved    Target Date  09/13/19      PT SHORT TERM GOAL #5   Title  Pt will verbalize understanding of fall prevention in home environment.    Baseline  able to verbalize understanding of fall prevention in his house - no throw rugs, night lights    Time  4    Period  Weeks    Status  Achieved    Target Date  09/13/19        PT Long Term Goals - 08/15/19 0828      PT LONG TERM GOAL #1   Title  Pt will be independent with progression of HEP to address strength, balance, posture, and mobility.  TARGET 10/11/2019     Time  8      Period  Weeks    Status  New    Target Date  10/11/19      PT LONG TERM GOAL #2   Title  Pt will improve gait velocity to at least 3 ft/sec for improved gait efficiency.    Time  8    Period  Weeks    Status  New    Target Date  10/11/19      PT LONG TERM GOAL #3   Title  Pt will improve MiniBESTest score to at least 19/28 for decreased fall risk.    Time  8    Period  Weeks    Status  New    Target Date  10/11/19      PT LONG TERM GOAL #4   Title  Pt will perform at least 8 of 10 reps of sit<>stand from <18" surface, minimal UE support, no LOB, for improved funcitonal strength with low surface transfers.    Time  8    Period  Weeks    Status  New    Target Date  10/11/19      PT LONG TERM GOAL #5   Title  Pt will verbalize plans for community fitness upon d/c from PT.    Time  8    Period  Weeks    Status  New    Target Date  10/11/19            Plan - 09/18/19 0175    Clinical Impression Statement  Continued to work on sit<>stand transfers (pt responds well to cue for increased forward lean towards chair, with no posterior LOB with sit<>stand today).  Also worked on solid and compliant surfaces with step strategies, with added balance challenges and perturbations.  Pt occasionally uses UE support for balance, and does need frequent reminder cues for forward pelvis/hips to avoid excessive posterior lean.    Personal Factors and Comorbidities  Comorbidity 2    Comorbidities  BPH, GERD    Examination-Activity Limitations  Stand;Locomotion Level;Transfers    Examination-Participation Restrictions  [employer], work   Stability/Clinical Decision Making  Stable/Uncomplicated    Rehab Potential  Good    PT Frequency  2x / week    PT Duration  8 weeks   including eval week   PT Treatment/Interventions  ADLs/Self Care Home Management;Gait training;Functional mobility training;Therapeutic activities;Therapeutic exercise;Balance  training;Neuromuscular re-education;DME Instruction;Patient/family education    PT Next Visit Plan  Continue to work on balance strategies (particularly for balance recovery in posterior and lateral directions), sit<>stand for functional strengthening; PWR! Moves in standing, work on compliant surfaces, gait training    PT Home Exercise Plan  seated PWR! moves, [id number]    Consulted and Agree with Plan of Care  Patient       Patient will benefit from skilled therapeutic intervention in order to improve the following deficits and impairments:  Abnormal gait, Difficulty walking, Decreased balance, Impaired flexibility, Decreased mobility, Decreased strength, Postural dysfunction  Visit Diagnosis: Unsteadiness on feet  Abnormal posture     Problem List Patient Active Problem List   Diagnosis Date Noted  . BPH (benign prostatic hypertrophy) with urinary retention 09/23/2013    Zamyah Wiesman W. 09/18/2019, 10:00 AM Frazier Butt., PT  [facility] [address] [place], [place], [address] Phone: [phone]   Fax:  [phone]  Name: ZETH BUDAY MRN: [id number] Date of Birth: 1946-07-13

## 2019-09-18 NOTE — Therapy (Signed)
[facility] [address] [place], [place], [address] Phone: [phone]   Fax:  [phone]  Physical Therapy Treatment  Patient Details  Name: Joshua Browning MRN: [id number] Date of Birth: 03-Dec-1945 Referring Provider (PT): Alonza Bogus, DO   Encounter Date: 09/18/2019  PT End of Session - 09/18/19 2154    Visit Number  9    Number of Visits  17    Date for PT Re-Evaluation  11/13/19    Authorization Type  [address] need 10th visit progress note    PT Start Time  1616    PT Stop Time  1656    PT Time Calculation (min)  40 min    Equipment Utilized During Treatment  Gait belt    Activity Tolerance  Patient tolerated treatment well    Behavior During Therapy  [facility] for tasks assessed/performed       Past Medical History:  Diagnosis Date  . BPH (benign prostatic hyperplasia)   . Foley catheter in place    removed after turp  . GERD (gastroesophageal reflux disease)   . Nocturnal leg cramps    occ  . Urinary retention   . Wears glasses     Past Surgical History:  Procedure Laterality Date  . CATARACT EXTRACTION W/ INTRAOCULAR LENS  IMPLANT, BILATERAL Bilateral   . INGUINAL HERNIA REPAIR Bilateral RIGHT x 2 1761 umbilical done with last right   left last one done 10 yrs ago  . RE-DO RIGHT INGUINAL HERNIA REPAIR AND UMBILICAL HERNIA REPAIR  2001  . REMOVAL LEFT EYE INTRAOCULAR LENS AND REPLACEMENT   08-29-2013  . TRANSURETHRAL RESECTION OF PROSTATE N/A 09/23/2013   Procedure: TRANSURETHRAL RESECTION OF THE PROSTATE (TURP) WITH GYRUS;  Surgeon: Claybon Jabs, MD;  Location: [facility];  Service: Urology;  Laterality: N/A;  . VARICOCELECTOMY Left 07/02/2018   Procedure: VARICOCELECTOMY;  Surgeon: Kathie Rhodes, MD;  Location: [facility];  Service: Urology;  Laterality: Left;    There were no vitals filed for this visit.  Subjective Assessment - 09/18/19 1617     Subjective  Tired today.  Came here straight from work.    Patient Stated Goals  Pt's goal for therapy is to improve balance and posture.    Currently in Pain?  No/denies    Pain Onset  More than a month ago                       [facility] Adult PT Treatment/Exercise - 09/18/19 1618      Transfers   Transfers  Sit to Stand;Stand to Sit    Sit to Stand  5: Supervision;Without upper extremity assist;From bed    Sit to Stand Details  Verbal cues for technique;Verbal cues for sequencing    Stand to Sit  5: Supervision;Without upper extremity assist;To bed    Stand to Sit Details (indicate cue type and reason)  Verbal cues for technique;Verbal cues for sequencing    Number of Reps  10 reps    Transfer Cueing  Visual cue of chair in front as target; pt leans head towards chair to increase anterior lean with sit>stand.      Ambulation/Gait   Ambulation/Gait  Yes    Ambulation/Gait Assistance  5: Supervision    Ambulation/Gait Assistance Details  Used bilateral walking poles for facilitation of reciprocal arm swing and cues for increased step length.  Once removed, pt able to keep long  strides, improved arm swing.    Ambulation Distance (Feet)  600 Feet   60 ft x 2   Assistive device  None   Used bilateral walking poles         Balance Exercises - 09/18/19 1638      Balance Exercises: Standing   Standing Eyes Opened  Wide (BOA);Head turns;Foam/compliant surface;5 reps   Head nods, 2 sets   Balance Beam  Standing on balance beam in parallel bars:  plantarflexion>standing neutral on beam, 3 sets x 10 reps to encourage more forward posture and position; cues provided for bringing hips forward.  Forward step taps x 10 reps, then back step taps x 10 reps, then side step taps x 10 reps, with intermittent UE support, working on balance/step strategy work.      Other Standing Exercises  Four square step activity, working on foward>side>back>side step (then reverse sequence), x at  least 5 reps.  Pt requires close supervision, cues for increased step length and step height for foot clearance, cues for widened BOS upon brief midline stance to regain balance.  After stepping strategy work on balance beam (see above), progressed to outside of parallel bars with perturbations given at hip/shoulders, varied directions (forward/back/side), with pt taking multiple steps in posterior direction; able to take 1 step in lateral directions to regain balance.          PT Short Term Goals - 09/12/19 1847      PT SHORT TERM GOAL #1   Title  Pt will be independent with HEP for improved balance, posture, functional mobility.  TARGET 09/13/2019    Baseline  Needs occasional cues for technique; pt reports doing the exercises, but probably not as much as he should    Time  4    Period  Weeks    Status  Partially Met    Target Date  09/13/19      PT SHORT TERM GOAL #2   Title  Pt will improve 5x sit<>stand to less than or equal to 12.5 seconds for improved transfer efficiency and safety.    Baseline  14 seconds with no UE support from standard chair    Time  4    Period  Weeks    Status  Not Met    Target Date  09/13/19      PT SHORT TERM GOAL #3   Title  Pt will improve posterior step and release test to 2 steps or less with independent balance recovery, to demonstrate improved balance recovery in posterior direction.    Baseline  1 step to recover posterior direction    Time  4    Period  Weeks    Status  Achieved    Target Date  09/13/19      PT SHORT TERM GOAL #4   Title  Pt will improve MiniBESTest score to at least 15/28 for decreased fall risk.    Baseline  17/28 09/11/2019    Time  4    Period  Weeks    Status  Achieved    Target Date  09/13/19      PT SHORT TERM GOAL #5   Title  Pt will verbalize understanding of fall prevention in home environment.    Baseline  able to verbalize understanding of fall prevention in his house - no throw rugs, night lights    Time   4    Period  Weeks    Status  Achieved    Target Date  09/13/19        PT Long Term Goals - 08/15/19 0828      PT LONG TERM GOAL #1   Title  Pt will be independent with progression of HEP to address strength, balance, posture, and mobility.  TARGET 10/11/2019    Time  8    Period  Weeks    Status  New    Target Date  10/11/19      PT LONG TERM GOAL #2   Title  Pt will improve gait velocity to at least 3 ft/sec for improved gait efficiency.    Time  8    Period  Weeks    Status  New    Target Date  10/11/19      PT LONG TERM GOAL #3   Title  Pt will improve MiniBESTest score to at least 19/28 for decreased fall risk.    Time  8    Period  Weeks    Status  New    Target Date  10/11/19      PT LONG TERM GOAL #4   Title  Pt will perform at least 8 of 10 reps of sit<>stand from <18" surface, minimal UE support, no LOB, for improved funcitonal strength with low surface transfers.    Time  8    Period  Weeks    Status  New    Target Date  10/11/19      PT LONG TERM GOAL #5   Title  Pt will verbalize plans for community fitness upon d/c from PT.    Time  8    Period  Weeks    Status  New    Target Date  10/11/19            Plan - 09/18/19 2155    Clinical Impression Statement  Continued to work on sit<>stand, balance reactions, balance perturbations.  Pt has more difficulty regaining balance in posterior direction today with perturbations than at last visit.  He will continue to beneift from skilled PT to further address blaance and gait.    Personal Factors and Comorbidities  Comorbidity 2    Comorbidities  BPH, GERD    Examination-Activity Limitations  Stand;Locomotion Level;Transfers    Examination-Participation Restrictions  [employer] fitness, work   Stability/Clinical Decision Making  Stable/Uncomplicated    Rehab Potential  Good    PT Frequency  2x / week    PT Duration  8 weeks   including eval week   PT Treatment/Interventions   ADLs/Self Care Home Management;Gait training;Functional mobility training;Therapeutic activities;Therapeutic exercise;Balance training;Neuromuscular re-education;DME Instruction;Patient/family education    PT Next Visit Plan  Next visit is 10th visit progress note.  Continue to work on balance strategies (particularly for balance recovery in posterior and lateral directions), sit<>stand for functional strengthening; PWR! Moves in standing, work on compliant surfaces, gait training    PT Home Exercise Plan  seated PWR! moves, [id number]    Consulted and Agree with Plan of Care  Patient       Patient will benefit from skilled therapeutic intervention in order to improve the following deficits and impairments:  Abnormal gait, Difficulty walking, Decreased balance, Impaired flexibility, Decreased mobility, Decreased strength, Postural dysfunction  Visit Diagnosis: Unsteadiness on feet  Other abnormalities of gait and mobility  Abnormal posture     Problem List Patient Active Problem List   Diagnosis Date Noted  . BPH (benign prostatic hypertrophy) with urinary retention 09/23/2013    Mililani Murthy  W. 09/18/2019, 9:57 PM Frazier Butt., PT  [facility] [address] [place], [place], [address] Phone: [phone]   Fax:  [phone]  Name: Joshua Browning MRN: [id number] Date of Birth: [birth year]

## 2019-09-24 ENCOUNTER — Ambulatory Visit: Payer: Medicare Other | Admitting: Physical Therapy

## 2019-09-24 ENCOUNTER — Other Ambulatory Visit: Payer: Self-pay

## 2019-09-24 DIAGNOSIS — R2689 Other abnormalities of gait and mobility: Secondary | ICD-10-CM

## 2019-09-24 DIAGNOSIS — R2681 Unsteadiness on feet: Secondary | ICD-10-CM

## 2019-09-24 NOTE — Patient Instructions (Signed)
Stand in a staggered stance position when you are standing steady    -Your goal is to NOT feel like you are going to lean backwards when you are standing still

## 2019-09-24 NOTE — Therapy (Signed)
[facility] [address] [place], [place], [address] Phone: [phone]   Fax:  [phone]  Physical Therapy Treatment/10th Visit Progress Note  Patient Details  Name: Joshua Browning MRN: [id number] Date of Birth: Apr 30, 1946 Referring Provider (PT): Alonza Bogus, DO   Encounter Date: 09/24/2019  PT End of Session - 09/24/19 1521    Visit Number  10    Number of Visits  17    Date for PT Re-Evaluation  11/13/19    Authorization Type  [facility] need 10th visit progress note    PT Start Time  0935    PT Stop Time  1016    PT Time Calculation (min)  41 min    Equipment Utilized During Treatment  Gait belt    Activity Tolerance  Patient tolerated treatment well    Behavior During Therapy  [facility] for tasks assessed/performed       Past Medical History:  Diagnosis Date  . BPH (benign prostatic hyperplasia)   . Foley catheter in place    removed after turp  . GERD (gastroesophageal reflux disease)   . Nocturnal leg cramps    occ  . Urinary retention   . Wears glasses     Past Surgical History:  Procedure Laterality Date  . CATARACT EXTRACTION W/ INTRAOCULAR LENS  IMPLANT, BILATERAL Bilateral   . INGUINAL HERNIA REPAIR Bilateral RIGHT x 2 8768 umbilical done with last right   left last one done 10 yrs ago  . RE-DO RIGHT INGUINAL HERNIA REPAIR AND UMBILICAL HERNIA REPAIR  2001  . REMOVAL LEFT EYE INTRAOCULAR LENS AND REPLACEMENT   08-29-2013  . TRANSURETHRAL RESECTION OF PROSTATE N/A 09/23/2013   Procedure: TRANSURETHRAL RESECTION OF THE PROSTATE (TURP) WITH GYRUS;  Surgeon: Claybon Jabs, MD;  Location: [facility];  Service: Urology;  Laterality: N/A;  . VARICOCELECTOMY Left 07/02/2018   Procedure: VARICOCELECTOMY;  Surgeon: Kathie Rhodes, MD;  Location: [facility];  Service: Urology;  Laterality: Left;    There were no vitals filed for this visit.  Subjective  Assessment - 09/24/19 0937    Subjective  No changes, no falls.  Sometimes mornings are not the best time of the day for me.    Patient Stated Goals  Pt's goal for therapy is to improve balance and posture.    Currently in Pain?  No/denies    Pain Onset  More than a month ago                       [facility] Adult PT Treatment/Exercise - 09/24/19 0938      Transfers   Transfers  Sit to Stand;Stand to Sit    Sit to Stand  5: Supervision;Without upper extremity assist;From bed;From chair/3-in-1    Five time sit to stand comments   15.03   from chair, arms crossed at chest   Stand to Sit  5: Supervision;Without upper extremity assist;To bed;To chair/3-in-1    Number of Reps  --   5 reps from mat (16.57 sec); then additional 5 resp from mat     Ambulation/Gait   Ambulation/Gait  Yes    Ambulation/Gait Assistance  5: Supervision    Ambulation/Gait Assistance Details  Used bilateral walking poles to facilitate reciprocal arm swing, increased step length with gait; with poles removed, pt able to maintain longer strides and arm swing.    Ambulation Distance (Feet)  500 Feet   60 ft  x 2; then 230 ft with starts/stops, quick reverse   Assistive device  None   bilateral walking poles to facilitate arm swing   Gait Pattern  Step-through pattern;Decreased arm swing - right;Decreased arm swing - left;Decreased step length - right;Decreased step length - left;Right flexed knee in stance;Trendelenburg;Decreased trunk rotation;Narrow base of support    Ambulation Surface  Level;Indoor    Gait velocity  10.69 sec = 3.06 ft/sec    Gait Comments  Gait with cues from PT for quick stop/starts; pt with narrow BOS and posterior lean.  Cues to widen BOS and stagger foot position, with tactile cues given at hips for improved weightshifting anterior/posterior to decrease posterior lean.  Also worked on changes of direction forward/back, with close supervision.      High Level Balance   High Level  Balance Comments  4-Square step: 16.03 sec, 16 sec, 15.22 sec, with supervision.          Balance Exercises - 09/24/19 1512      Balance Exercises: Standing   Stepping Strategy  Anterior;Posterior;Lateral;Foam/compliant surface;UE support;10 reps   On airex, intermittent UE support and min guard   Other Standing Exercises   Standing on Airex:  forward/back step and weightshift x 10 reps with UE support at parallel bars, cues for increased step length/foot clearance.  After stepping strategy work on Airex, progressed to outside of parallel bars with perturbations given at hips, varied directions (back/side), with pt taking multiple steps in posterior direction; able to take 1 step in lateral directions to regain balance.     Stagger stance position, cues provided at hips for anterior/posterior weightshifting    PT Education - 09/24/19 1515    Education Details  Standing position with quick stops with gait, with static standing    Person(s) Educated  Patient    Methods  Explanation;Demonstration;Handout    Comprehension  Verbalized understanding;Returned demonstration;Need further instruction       PT Short Term Goals - 09/12/19 1847      PT SHORT TERM GOAL #1   Title  Pt will be independent with HEP for improved balance, posture, functional mobility.  TARGET 09/13/2019    Baseline  Needs occasional cues for technique; pt reports doing the exercises, but probably not as much as he should    Time  4    Period  Weeks    Status  Partially Met    Target Date  09/13/19      PT SHORT TERM GOAL #2   Title  Pt will improve 5x sit<>stand to less than or equal to 12.5 seconds for improved transfer efficiency and safety.    Baseline  14 seconds with no UE support from standard chair    Time  4    Period  Weeks    Status  Not Met    Target Date  09/13/19      PT SHORT TERM GOAL #3   Title  Pt will improve posterior step and release test to 2 steps or less with independent balance  recovery, to demonstrate improved balance recovery in posterior direction.    Baseline  1 step to recover posterior direction    Time  4    Period  Weeks    Status  Achieved    Target Date  09/13/19      PT SHORT TERM GOAL #4   Title  Pt will improve MiniBESTest score to at least 15/28 for decreased fall risk.    Baseline  17/28  09/11/2019    Time  4    Period  Weeks    Status  Achieved    Target Date  09/13/19      PT SHORT TERM GOAL #5   Title  Pt will verbalize understanding of fall prevention in home environment.    Baseline  able to verbalize understanding of fall prevention in his house - no throw rugs, night lights    Time  4    Period  Weeks    Status  Achieved    Target Date  09/13/19        PT Long Term Goals - 08/15/19 9021      PT LONG TERM GOAL #1   Title  Pt will be independent with progression of HEP to address strength, balance, posture, and mobility.  TARGET 10/11/2019    Time  8    Period  Weeks    Status  New    Target Date  10/11/19      PT LONG TERM GOAL #2   Title  Pt will improve gait velocity to at least 3 ft/sec for improved gait efficiency.    Time  8    Period  Weeks    Status  New    Target Date  10/11/19      PT LONG TERM GOAL #3   Title  Pt will improve MiniBESTest score to at least 19/28 for decreased fall risk.    Time  8    Period  Weeks    Status  New    Target Date  10/11/19      PT LONG TERM GOAL #4   Title  Pt will perform at least 8 of 10 reps of sit<>stand from <18" surface, minimal UE support, no LOB, for improved funcitonal strength with low surface transfers.    Time  8    Period  Weeks    Status  New    Target Date  10/11/19      PT LONG TERM GOAL #5   Title  Pt will verbalize plans for community fitness upon d/c from PT.    Time  8    Period  Weeks    Status  New    Target Date  10/11/19            Plan - 09/24/19 1523    Clinical Impression Statement  10th Visit progress report, covering dates  08/14/2019-09/24/2019:  Objective measures taken today:  5x sit<>stand 15.03 sec, gait velocity 3.06 ft/sec (improved from 2.68 ft/sec); on MiniBESTest 09/11/2019, score 17/28, improved from 11/28.  STGs recently assessed with pt meeting 3 STGs.  Pt continues to demonstrate difficulty with balance perturbations and posterior LOB with static standing.  He will continue to benefit from skilled PT to further work towards [place] for improved functional mobility, decreased fall risk.    Personal Factors and Comorbidities  Comorbidity 2    Comorbidities  BPH, GERD    Examination-Activity Limitations  Stand;Locomotion Level;Transfers    Examination-Participation Restrictions  [employer] fitness, work   Stability/Clinical Decision Making  Stable/Uncomplicated    Rehab Potential  Good    PT Frequency  2x / week    PT Duration  8 weeks   including eval week   PT Treatment/Interventions  ADLs/Self Care Home Management;Gait training;Functional mobility training;Therapeutic activities;Therapeutic exercise;Balance training;Neuromuscular re-education;DME Instruction;Patient/family education    PT Next Visit Plan  Next visit is 10th visit progress note.  Continue to work  on balance strategies (particularly for balance recovery in posterior and lateral directions), sit<>stand for functional strengthening; PWR! Moves in standing, work on compliant surfaces, gait training    PT Home Exercise Plan  seated PWR! moves, [id number]    Consulted and Agree with Plan of Care  Patient       Patient will benefit from skilled therapeutic intervention in order to improve the following deficits and impairments:  Abnormal gait, Difficulty walking, Decreased balance, Impaired flexibility, Decreased mobility, Decreased strength, Postural dysfunction  Visit Diagnosis: Unsteadiness on feet  Other abnormalities of gait and mobility     Problem List Patient Active Problem List   Diagnosis Date Noted  . BPH  (benign prostatic hypertrophy) with urinary retention 09/23/2013    Zalyn Amend W. 09/24/2019, 7:21 PM  Frazier Butt., PT   [facility] [address] [place], [place], [address] Phone: [phone]   Fax:  [phone]  Name: Joshua Browning MRN: [id number] Date of Birth: 22-Nov-1945

## 2019-09-25 ENCOUNTER — Encounter: Payer: Self-pay | Admitting: Physical Therapy

## 2019-09-25 ENCOUNTER — Other Ambulatory Visit: Payer: Self-pay

## 2019-09-25 ENCOUNTER — Ambulatory Visit: Payer: Medicare Other | Admitting: Physical Therapy

## 2019-09-25 DIAGNOSIS — R2681 Unsteadiness on feet: Secondary | ICD-10-CM | POA: Diagnosis not present

## 2019-09-25 DIAGNOSIS — R2689 Other abnormalities of gait and mobility: Secondary | ICD-10-CM

## 2019-09-26 NOTE — Patient Instructions (Signed)
Access Code: [email or web address] URL: [email or web address] Date: 09/25/2019 Prepared by: Mady Haagensen  Program Notes Wall bumps: standing at the wall with a chair in front of you for safety with both hands on chair, practicing bending forward from the hips and bumping your bottom into the wall and then using your hips - think belly button forward. 2 x 10 reps, 5x a week  Exercises Seated Hamstring Stretch - 3 reps - 1 sets - 30 sec hold - 2-3x daily - 5x weekly Sit to Stand with Hands on Knees - 5-10 reps - 2 sets - 1-2x daily - 7x weekly Heel Toe Raises with Counter Support - 10 reps - 1-2 sets - 1x daily - 7x weekly Seated Active Hip Flexion - 10 reps - 1 sets - 1x daily - 5x weekly Alternating Step Backward with Support - 10 reps - 2 sets - 1x daily - 5x weekly  Added 09/26/2019: Sit to stand in stride stance - 5 reps - 1-2 sets - 1x daily - 7x weekly

## 2019-09-26 NOTE — Therapy (Signed)
[facility] [address] [place], [place], [address] Phone: [phone]   Fax:  [phone]  Physical Therapy Treatment  Patient Details  Name: Joshua Browning MRN: [id number] Date of Birth: 1946/04/18 Referring Provider (PT): Alonza Bogus, DO   Encounter Date: 09/25/2019  PT End of Session - 09/26/19 0823    Visit Number  11    Number of Visits  17    Date for PT Re-Evaluation  11/13/19    Authorization Type  [place] need 10th visit progress note    PT Start Time  1619    PT Stop Time  1657    PT Time Calculation (min)  38 min    Equipment Utilized During Treatment  Gait belt    Activity Tolerance  Patient tolerated treatment well    Behavior During Therapy  [facility] for tasks assessed/performed       Past Medical History:  Diagnosis Date  . BPH (benign prostatic hyperplasia)   . Foley catheter in place    removed after turp  . GERD (gastroesophageal reflux disease)   . Nocturnal leg cramps    occ  . Urinary retention   . Wears glasses     Past Surgical History:  Procedure Laterality Date  . CATARACT EXTRACTION W/ INTRAOCULAR LENS  IMPLANT, BILATERAL Bilateral   . INGUINAL HERNIA REPAIR Bilateral RIGHT x 2 9381 umbilical done with last right   left last one done 10 yrs ago  . RE-DO RIGHT INGUINAL HERNIA REPAIR AND UMBILICAL HERNIA REPAIR  2001  . REMOVAL LEFT EYE INTRAOCULAR LENS AND REPLACEMENT   08-29-2013  . TRANSURETHRAL RESECTION OF PROSTATE N/A 09/23/2013   Procedure: TRANSURETHRAL RESECTION OF THE PROSTATE (TURP) WITH GYRUS;  Surgeon: Claybon Jabs, MD;  Location: [facility];  Service: Urology;  Laterality: N/A;  . VARICOCELECTOMY Left 07/02/2018   Procedure: VARICOCELECTOMY;  Surgeon: Kathie Rhodes, MD;  Location: [facility];  Service: Urology;  Laterality: Left;    There were no vitals filed for this visit.  Subjective Assessment - 09/25/19 1622     Subjective  No changes since yesterday's visit.  Really haven't practiced the stagger standing position.  Really want to make sure to work on my balance.    Patient Stated Goals  Pt's goal for therapy is to improve balance and posture.    Currently in Pain?  No/denies    Pain Onset  More than a month ago                       [facility] Adult PT Treatment/Exercise - 09/25/19 1623      Transfers   Transfers  Sit to Stand;Stand to Sit    Sit to Stand  5: Supervision;Without upper extremity assist;From bed;From chair/3-in-1    Stand to Sit  5: Supervision;Without upper extremity assist;To bed;To chair/3-in-1    Transfer Cueing  Provided cues for foot positioning in stride stance, for improved stability upon standing.  Pt performs x 3 reps each foot position; then upon standing, performs diagonal forward/back weightshift x 5-10 reps.      Ambulation/Gait   Ambulation/Gait  Yes    Ambulation/Gait Assistance  5: Supervision    Ambulation Distance (Feet)  500 Feet   400; 35 ft x 8 reps with turning practice   Assistive device  None    Gait Pattern  Step-through pattern;Decreased arm swing - right;Decreased arm swing - left;Decreased step length -  right;Decreased step length - left;Right flexed knee in stance;Trendelenburg;Decreased trunk rotation;Narrow base of support    Ambulation Surface  Level;Indoor    Pre-Gait Activities  Cues for turning to pick up each foot with turning-pt able to improve and pt notes smoother turn (versus using pivot turn)    Gait Comments  Gait performed with conversation tasks, no specific LOB, but slightly slowed and decreased arm swing with conversation tasks.          Balance Exercises - 09/26/19 0817      Balance Exercises: Standing   Wall Bumps  Hip;10 reps;Eyes opened   in parallel bars   Marching  Foam/compliant surface;Upper extremity assist 2;10 reps    Heel Raises  Both;20 reps    Toe Raise  Both;20 reps    Other Standing Exercises   Standing on red mat:  forward/back step and weigthshift x 10 reps with UE support;         PT Education - 09/26/19 0823    Education Details  Added to HEP, stride stance for sit<>stand    Person(s) Educated  Patient    Methods  Explanation;Demonstration;Verbal cues;Handout    Comprehension  Verbalized understanding;Returned demonstration       PT Short Term Goals - 09/12/19 1847      PT SHORT TERM GOAL #1   Title  Pt will be independent with HEP for improved balance, posture, functional mobility.  TARGET 09/13/2019    Baseline  Needs occasional cues for technique; pt reports doing the exercises, but probably not as much as he should    Time  4    Period  Weeks    Status  Partially Met    Target Date  09/13/19      PT SHORT TERM GOAL #2   Title  Pt will improve 5x sit<>stand to less than or equal to 12.5 seconds for improved transfer efficiency and safety.    Baseline  14 seconds with no UE support from standard chair    Time  4    Period  Weeks    Status  Not Met    Target Date  09/13/19      PT SHORT TERM GOAL #3   Title  Pt will improve posterior step and release test to 2 steps or less with independent balance recovery, to demonstrate improved balance recovery in posterior direction.    Baseline  1 step to recover posterior direction    Time  4    Period  Weeks    Status  Achieved    Target Date  09/13/19      PT SHORT TERM GOAL #4   Title  Pt will improve MiniBESTest score to at least 15/28 for decreased fall risk.    Baseline  17/28 09/11/2019    Time  4    Period  Weeks    Status  Achieved    Target Date  09/13/19      PT SHORT TERM GOAL #5   Title  Pt will verbalize understanding of fall prevention in home environment.    Baseline  able to verbalize understanding of fall prevention in his house - no throw rugs, night lights    Time  4    Period  Weeks    Status  Achieved    Target Date  09/13/19        PT Long Term Goals - 08/15/19 0828      PT LONG  TERM GOAL #1  Title  Pt will be independent with progression of HEP to address strength, balance, posture, and mobility.  TARGET 10/11/2019    Time  8    Period  Weeks    Status  New    Target Date  10/11/19      PT LONG TERM GOAL #2   Title  Pt will improve gait velocity to at least 3 ft/sec for improved gait efficiency.    Time  8    Period  Weeks    Status  New    Target Date  10/11/19      PT LONG TERM GOAL #3   Title  Pt will improve MiniBESTest score to at least 19/28 for decreased fall risk.    Time  8    Period  Weeks    Status  New    Target Date  10/11/19      PT LONG TERM GOAL #4   Title  Pt will perform at least 8 of 10 reps of sit<>stand from <18" surface, minimal UE support, no LOB, for improved funcitonal strength with low surface transfers.    Time  8    Period  Weeks    Status  New    Target Date  10/11/19      PT LONG TERM GOAL #5   Title  Pt will verbalize plans for community fitness upon d/c from PT.    Time  8    Period  Weeks    Status  New    Target Date  10/11/19            Plan - 09/26/19 0824    Clinical Impression Statement  Pt responds well to cues for stride stance foot position for better balance with sit<>stand and with quick stops with gait.  Pt will continue to benefit from skilled PT to further address balance and gait towards LTGs.    Personal Factors and Comorbidities  Comorbidity 2    Comorbidities  BPH, GERD    Examination-Activity Limitations  Stand;Locomotion Level;Transfers    Examination-Participation Restrictions  [employer] fitness, work   Stability/Clinical Decision Making  Stable/Uncomplicated    Rehab Potential  Good    PT Frequency  2x / week    PT Duration  8 weeks   including eval week   PT Treatment/Interventions  ADLs/Self Care Home Management;Gait training;Functional mobility training;Therapeutic activities;Therapeutic exercise;Balance training;Neuromuscular re-education;DME  Instruction;Patient/family education    PT Next Visit Plan  Likely working towards d/c next week.  Make sure pt is independently performing HEP and check LTGs.    PT Home Exercise Plan  seated PWR! moves, [id number]    Consulted and Agree with Plan of Care  Patient       Patient will benefit from skilled therapeutic intervention in order to improve the following deficits and impairments:  Abnormal gait, Difficulty walking, Decreased balance, Impaired flexibility, Decreased mobility, Decreased strength, Postural dysfunction  Visit Diagnosis: Unsteadiness on feet  Other abnormalities of gait and mobility     Problem List Patient Active Problem List   Diagnosis Date Noted  . BPH (benign prostatic hypertrophy) with urinary retention 09/23/2013    Joshua Browning W. 09/26/2019, 8:26 AM Frazier Butt., PT  [facility] [address] [place], [place], [address] Phone: [phone]   Fax:  [phone]  Name: IZZAK FRIES MRN: [id number] Date of Birth: [birth year]

## 2019-10-01 ENCOUNTER — Other Ambulatory Visit: Payer: Self-pay

## 2019-10-01 ENCOUNTER — Ambulatory Visit: Payer: Medicare Other | Admitting: Physical Therapy

## 2019-10-01 ENCOUNTER — Encounter: Payer: Self-pay | Admitting: Physical Therapy

## 2019-10-01 DIAGNOSIS — R2681 Unsteadiness on feet: Secondary | ICD-10-CM | POA: Diagnosis not present

## 2019-10-01 DIAGNOSIS — R2689 Other abnormalities of gait and mobility: Secondary | ICD-10-CM

## 2019-10-01 NOTE — Patient Instructions (Signed)
Provided patient with community PD resources including exercise opportunities
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
## 2019-10-02 ENCOUNTER — Ambulatory Visit: Payer: Medicare Other | Admitting: Physical Therapy

## 2019-10-02 DIAGNOSIS — R2689 Other abnormalities of gait and mobility: Secondary | ICD-10-CM

## 2019-10-02 DIAGNOSIS — R2681 Unsteadiness on feet: Secondary | ICD-10-CM

## 2019-10-02 NOTE — Therapy (Signed)
[facility] [address] [place], [place], [address] Phone: [phone]   Fax:  [phone]  Physical Therapy Treatment/Discharge Summary  Patient Details  Name: Joshua Browning MRN: [id number] Date of Birth: Sep 18, 1945 Referring Provider (PT): Alonza Bogus, DO   Encounter Date: 10/02/2019  PT End of Session - 10/02/19 2105    Visit Number  13    Number of Visits  17    Date for PT Re-Evaluation  11/13/19    Authorization Type  [place] need 10th visit progress note    PT Start Time  1620    PT Stop Time  1653    PT Time Calculation (min)  33 min    Equipment Utilized During Treatment  Gait belt    Activity Tolerance  Patient tolerated treatment well    Behavior During Therapy  [facility] for tasks assessed/performed       Past Medical History:  Diagnosis Date  . BPH (benign prostatic hyperplasia)   . Foley catheter in place    removed after turp  . GERD (gastroesophageal reflux disease)   . Nocturnal leg cramps    occ  . Urinary retention   . Wears glasses     Past Surgical History:  Procedure Laterality Date  . CATARACT EXTRACTION W/ INTRAOCULAR LENS  IMPLANT, BILATERAL Bilateral   . INGUINAL HERNIA REPAIR Bilateral RIGHT x 2 [id number] umbilical done with last right   left last one done 10 yrs ago  . RE-DO RIGHT INGUINAL HERNIA REPAIR AND UMBILICAL HERNIA REPAIR  2001  . REMOVAL LEFT EYE INTRAOCULAR LENS AND REPLACEMENT   08-29-2013  . TRANSURETHRAL RESECTION OF PROSTATE N/A 09/23/2013   Procedure: TRANSURETHRAL RESECTION OF THE PROSTATE (TURP) WITH GYRUS;  Surgeon: Claybon Jabs, MD;  Location: [facility];  Service: Urology;  Laterality: N/A;  . VARICOCELECTOMY Left 07/02/2018   Procedure: VARICOCELECTOMY;  Surgeon: Kathie Rhodes, MD;  Location: [facility];  Service: Urology;  Laterality: Left;    There were no vitals filed for this visit.  Subjective  Assessment - 10/02/19 1623    Subjective  No changes, no falls since last visit.    Patient Stated Goals  Pt's goal for therapy is to improve balance and posture.    Currently in Pain?  No/denies    Pain Onset  More than a month ago                       [facility] Adult PT Treatment/Exercise - 10/02/19 0001      Transfers   Transfers  Sit to Stand;Stand to Sit    Sit to Stand  6: Modified independent (Device/Increase time);Without upper extremity assist;From bed    Stand to Sit  6: Modified independent (Device/Increase time);Without upper extremity assist;To bed    Number of Reps  2 sets;10 reps   from 20" mat, then 5 reps 18"chair, then 16" chair     Ambulation/Gait   Ambulation/Gait  Yes    Ambulation/Gait Assistance  6: Modified independent (Device/Increase time);5: Supervision    Ambulation Distance (Feet)  300 Feet    Assistive device  None    Gait Pattern  Step-through pattern;Decreased arm swing - right;Decreased arm swing - left;Decreased step length - right;Decreased step length - left;Right flexed knee in stance;Trendelenburg;Decreased trunk rotation;Narrow base of support    Ambulation Surface  Level;Indoor    Gait velocity  9.97 sec = 3.29 ft/sec  High Level Balance   High Level Balance Comments  MiniBESTest score:  24/28      Self-Care   Self-Care  Other Self-Care Comments    Other Self-Care Comments   Reviewed importance of HEP, fall prevention, including using plenty of light, extra caution on compliant surfaces with EC; discussed progress towards goals, plans for d/c and plans for return screen in 6-9 months.         Mini-BESTest: Balance Evaluation Systems Test  2005-2013 [employer]. All rights reserved. ________________________________________________________________________________________Anticipatory_________Subscore___5__/6 1. SIT TO STAND Instruction: "Cross your arms across your chest. Try not to use your hands  unless you must.Do not let your legs lean against the back of the chair when you stand. Please stand up now." X(2) Normal: Comes to stand without use of hands and stabilizes independently. (1) Moderate: Comes to stand WITH use of hands on first attempt. (0) Severe: Unable to stand up from chair without assistance, OR needs several attempts with use of hands. 2. RISE TO TOES Instruction: "Place your feet shoulder width apart. Place your hands on your hips. Try to rise as high as you can onto your toes. I will count out loud to 3 seconds. Try to hold this pose for at least 3 seconds. Look straight ahead. Rise now." X(2) Normal: Stable for 3 s with maximum height. (1) Moderate: Heels up, but not full range (smaller than when holding hands), OR noticeable instability for 3 s. (0) Severe: < 3 s. 3. STAND ON ONE LEG Instruction: "Look straight ahead. Keep your hands on your hips. Lift your leg off of the ground behind you without touching or resting your raised leg upon your other standing leg. Stay standing on one leg as long as you can. Look straight ahead. Lift now." Left: Time in Seconds Trial 1:__11.34___Trial 2:_____ (2) Normal: 20 s. X(1) Moderate: < 20 s. (0) Severe: Unable. Right: Time in Seconds Trial 1:__19.35___Trial 2:_____ (2) Normal: 20 s. X(1) Moderate: < 20 s. (0) Severe: Unable To score each side separately use the trial with the longest time. To calculate the sub-score and total score use the side [left or right] with the lowest numerical score [i.e. the worse side]. ______________________________________________________________________________________Reactive Postural Control___________Subscore:___6__/6 4. COMPENSATORY STEPPING CORRECTION- FORWARD Instruction: "Stand with your feet shoulder width apart, arms at your sides. Lean forward against my hands beyond your forward limits. When I let go, do whatever is necessary, including taking a step, to avoid a fall." X(2)  Normal: Recovers independently with a single, large step (second realignment step is allowed). (1) Moderate: More than one step used to recover equilibrium. (0) Severe: No step, OR would fall if not caught, OR falls spontaneously. 5. COMPENSATORY STEPPING CORRECTION- BACKWARD Instruction: "Stand with your feet shoulder width apart, arms at your sides. Lean backward against my hands beyond your backward limits. When I let go, do whatever is necessary, including taking a step, to avoid a fall." X(2) Normal: Recovers independently with a single, large step. (1) Moderate: More than one step used to recover equilibrium. (0) Severe: No step, OR would fall if not caught, OR falls spontaneously. 6. COMPENSATORY STEPPING CORRECTION- LATERAL Instruction: "Stand with your feet together, arms down at your sides. Lean into my hand beyond your sideways limit. When I let go, do whatever is necessary, including taking a step, to avoid a fall." Left X(2) Normal: Recovers independently with 1 step (crossover or lateral OK). (1) Moderate: Several steps to recover equilibrium. (0) Severe: Falls, or  cannot step. Right X(2) Normal: Recovers independently with 1 step (crossover or lateral OK). (1) Moderate: Several steps to recover equilibrium. (0) Severe: Falls, or cannot step. Use the side with the lowest score to calculate sub-score and total score. ____________________________________________________________________________________Sensory Orientation_____________Subscore:____4_____/6 7. STANCE (FEET TOGETHER); EYES OPEN, FIRM SURFACE Instruction: "Place your hands on your hips. Place your feet together until almost touching. Look straight ahead. Be as stable and still as possible, until I say stop." Time in seconds:________ X(2) Normal: 30 s. (1) Moderate: < 30 s. (0) Severe: Unable. 8. STANCE (FEET TOGETHER); EYES CLOSED, FOAM SURFACE Instruction: "Step onto the foam. Place your hands on your hips.  Place your feet together until almost touching. Be as stable and still as possible, until I say stop. I will start timing when you close your eyes." Time in seconds:________ (2) Normal: 30 s. X(1) Moderate: < 30 s. (0) Severe: Unable. 9. INCLINE- EYES CLOSED Instruction: "Step onto the incline ramp. Please stand on the incline ramp with your toes toward the top. Place your feet shoulder width apart and have your arms down at your sides. I will start timing when you close your eyes." Time in seconds:________ (2) Normal: Stands independently 30 s and aligns with gravity. X(1) Moderate: Stands independently <30 s OR aligns with surface. (0) Severe: Unable. _________________________________________________________________________________________Dynamic Gait ______Subscore_____8___/10 10. CHANGE IN GAIT SPEED Instruction: "Begin walking at your normal speed, when I tell you 'fast', walk as fast as you can. When I say 'slow', walk very slowly." X(2) Normal: Significantly changes walking speed without imbalance. (1) Moderate: Unable to change walking speed or signs of imbalance. (0) Severe: Unable to achieve significant change in walking speed AND signs of imbalance. [place] - HORIZONTAL Instruction: "Begin walking at your normal speed, when I say "right", turn your head and look to the right. When I say "left" turn your head and look to the left. Try to keep yourself walking in a straight line." (2) Normal: performs head turns with no change in gait speed and good balance. X(1) Moderate: performs head turns with reduction in gait speed. (0) Severe: performs head turns with imbalance. 12. WALK WITH PIVOT TURNS Instruction: "Begin walking at your normal speed. When I tell you to 'turn and stop', turn as quickly as you can, face the opposite direction, and stop. After the turn, your feet should be close together." X(2) Normal: Turns with feet close FAST (< 3 steps) with good  balance. (1) Moderate: Turns with feet close SLOW (>4 steps) with good balance. (0) Severe: Cannot turn with feet close at any speed without imbalance. 13. STEP OVER OBSTACLES Instruction: "Begin walking at your normal speed. When you get to the box, step over it, not around it and keep walking." (2) Normal: Able to step over box with minimal change of gait speed and with good balance. X(1) Moderate: Steps over box but touches box OR displays cautious behavior by slowing gait. (0) Severe: Unable to step over box OR steps around box. 14. TIMED UP & GO WITH DUAL TASK [3 METER WALK] Instruction TUG: "When I say 'Go', stand up from chair, walk at your normal speed across the tape on the floor, turn around, and come back to sit in the chair." Instruction TUG with Dual Task: "Count backwards by threes starting at ___. When I say 'Go', stand up from chair, walk at your normal speed across the tape on the floor, turn around, and come back to sit in the  chair. Continue counting backwards the entire time." TUG: ____11.75____seconds; Dual Task TUG: ___12.44_____seconds X(2) Normal: No noticeable change in sitting, standing or walking while backward counting when compared to TUG without Dual Task. (1) Moderate: Dual Task affects either counting OR walking (>10%) when compared to the TUG without Dual Task. (0) Severe: Stops counting while walking OR stops walking while counting. When scoring item 14, if subject's gait speed slows more than 10% between the TUG without and with a Dual Task the score should be decreased by a point. TOTAL SCORE: ___24_____/28      PT Education - 10/02/19 2105    Education Details  Progress towards goals, POC, and plans for d/c this visit    Person(s) Educated  Patient    Methods  Explanation    Comprehension  Verbalized understanding       PT Short Term Goals - 09/12/19 1847      PT SHORT TERM GOAL #1   Title  Pt will be independent with HEP for improved  balance, posture, functional mobility.  TARGET 09/13/2019    Baseline  Needs occasional cues for technique; pt reports doing the exercises, but probably not as much as he should    Time  4    Period  Weeks    Status  Partially Met    Target Date  09/13/19      PT SHORT TERM GOAL #2   Title  Pt will improve 5x sit<>stand to less than or equal to 12.5 seconds for improved transfer efficiency and safety.    Baseline  14 seconds with no UE support from standard chair    Time  4    Period  Weeks    Status  Not Met    Target Date  09/13/19      PT SHORT TERM GOAL #3   Title  Pt will improve posterior step and release test to 2 steps or less with independent balance recovery, to demonstrate improved balance recovery in posterior direction.    Baseline  1 step to recover posterior direction    Time  4    Period  Weeks    Status  Achieved    Target Date  09/13/19      PT SHORT TERM GOAL #4   Title  Pt will improve MiniBESTest score to at least 15/28 for decreased fall risk.    Baseline  17/28 09/11/2019    Time  4    Period  Weeks    Status  Achieved    Target Date  09/13/19      PT SHORT TERM GOAL #5   Title  Pt will verbalize understanding of fall prevention in home environment.    Baseline  able to verbalize understanding of fall prevention in his house - no throw rugs, night lights    Time  4    Period  Weeks    Status  Achieved    Target Date  09/13/19        PT Long Term Goals - 10/02/19 1625      PT LONG TERM GOAL #1   Title  Pt will be independent with progression of HEP to address strength, balance, posture, and mobility.  TARGET 10/11/2019    Time  8    Period  Weeks    Status  Partially Met      PT LONG TERM GOAL #2   Title  Pt will improve gait velocity to at least 3 ft/sec for improved  gait efficiency.    Time  8    Period  Weeks    Status  Achieved      PT LONG TERM GOAL #3   Title  Pt will improve MiniBESTest score to at least 19/28 for decreased fall risk.     Time  8    Period  Weeks    Status  Achieved      PT LONG TERM GOAL #4   Title  Pt will perform at least 8 of 10 reps of sit<>stand from <18" surface, minimal UE support, no LOB, for improved funcitonal strength with low surface transfers.    Time  8    Period  Weeks    Status  Achieved      PT LONG TERM GOAL #5   Title  Pt will verbalize plans for community fitness upon d/c from PT.    Time  8    Period  Weeks    Status  Achieved            Plan - 10/02/19 2106    Clinical Impression Statement  Pt has met 4 of 5 LTGs, assessed this visit.  Pt has demonstrated overall improvement in transfers, balance, and gait velocity.  Pt is aware of importance of continued HEP; he is appropriate for d/c this visit.    Personal Factors and Comorbidities  Comorbidity 2    Comorbidities  BPH, GERD    Examination-Activity Limitations  Stand;Locomotion Level;Transfers    Examination-Participation Restrictions  [employer] fitness, work   Stability/Clinical Decision Making  Stable/Uncomplicated    Rehab Potential  Good    PT Frequency  2x / week    PT Duration  8 weeks   including eval week   PT Treatment/Interventions  ADLs/Self Care Home Management;Gait training;Functional mobility training;Therapeutic activities;Therapeutic exercise;Balance training;Neuromuscular re-education;DME Instruction;Patient/family education    PT Next Visit Plan  D/C this visit; pt agrees to return PT, OT, speech therapy screens in 6-8 months    PT Home Exercise Plan  seated PWR! moves, [id number]    Consulted and Agree with Plan of Care  Patient       Patient will benefit from skilled therapeutic intervention in order to improve the following deficits and impairments:  Abnormal gait, Difficulty walking, Decreased balance, Impaired flexibility, Decreased mobility, Decreased strength, Postural dysfunction  Visit Diagnosis: Other abnormalities of gait and mobility  Unsteadiness on  feet     Problem List Patient Active Problem List   Diagnosis Date Noted  . BPH (benign prostatic hypertrophy) with urinary retention 09/23/2013    Esaias Cleavenger W. 10/02/2019, 9:11 PM  Frazier Butt., PT   [facility] [address] [place], [place], [address] Phone: [phone]   Fax:  [phone]  Name: KUSH FARABEE MRN: [id number] Date of Birth: 1946-01-13   PHYSICAL THERAPY DISCHARGE SUMMARY  Visits from Start of Care: 13  Current functional level related to goals / functional outcomes: PT Long Term Goals - 10/02/19 1625      PT LONG TERM GOAL #1   Title  Pt will be independent with progression of HEP to address strength, balance, posture, and mobility.  TARGET 10/11/2019    Time  8    Period  Weeks    Status  Partially Met      PT LONG TERM GOAL #2   Title  Pt will improve gait velocity to at least 3 ft/sec for improved gait efficiency.  Time  8    Period  Weeks    Status  Achieved      PT LONG TERM GOAL #3   Title  Pt will improve MiniBESTest score to at least 19/28 for decreased fall risk.    Time  8    Period  Weeks    Status  Achieved      PT LONG TERM GOAL #4   Title  Pt will perform at least 8 of 10 reps of sit<>stand from <18" surface, minimal UE support, no LOB, for improved funcitonal strength with low surface transfers.    Time  8    Period  Weeks    Status  Achieved      PT LONG TERM GOAL #5   Title  Pt will verbalize plans for community fitness upon d/c from PT.    Time  8    Period  Weeks    Status  Achieved      Pt has met 4 of 5 LTGs.   Remaining deficits: Posterior LOB at times, but has improved   Education / Equipment: Pt has been educated in HEP, fall prevention, community fitness options.  Plan: Patient agrees to discharge.  Patient goals were met. Patient is being discharged due to meeting the stated rehab goals.  ?????Recommend PT, OT, speech screens in 6  months.   Mady Haagensen, PT 10/02/19 9:17 PM Phone: [phone] Fax: [phone]

## 2019-10-13 ENCOUNTER — Ambulatory Visit: Payer: Medicare Other | Attending: Internal Medicine

## 2019-10-13 DIAGNOSIS — Z23 Encounter for immunization: Secondary | ICD-10-CM | POA: Insufficient documentation

## 2019-10-13 NOTE — Progress Notes (Signed)
   Covid-19 Vaccination Clinic  Name:  Joshua Browning    MRN: [id number] DOB: 08/02/1946  10/13/2019  [name] was observed post Covid-19 immunization for 15 minutes without incidence. He was provided with Vaccine Information Sheet and instruction to access the V-Safe system.   [name] was instructed to call 911 with any severe reactions post vaccine: Marland Kitchen Difficulty breathing  . Swelling of your face and throat  . A fast heartbeat  . A bad rash all over your body  . Dizziness and weakness    Immunizations Administered    Name Date Dose VIS Date Route   Pfizer COVID-19 Vaccine 10/13/2019  9:22 AM 0.3 mL 07/26/2019 Intramuscular   Manufacturer: [place]   Lot: [id number]   [place]: [id number]

## 2019-11-06 ENCOUNTER — Ambulatory Visit: Payer: Medicare Other | Attending: Internal Medicine

## 2019-11-06 DIAGNOSIS — Z23 Encounter for immunization: Secondary | ICD-10-CM

## 2019-11-06 NOTE — Progress Notes (Signed)
   Covid-19 Vaccination Clinic  Name:  Joshua Browning    MRN: [id number] DOB: [birth year]  11/06/2019  [name] was observed post Covid-19 immunization for 15 minutes without incident. He was provided with Vaccine Information Sheet and instruction to access the V-Safe system.   [name] was instructed to call 911 with any severe reactions post vaccine: Marland Kitchen Difficulty breathing  . Swelling of face and throat  . A fast heartbeat  . A bad rash all over body  . Dizziness and weakness   Immunizations Administered    Name Date Dose VIS Date Route   Pfizer COVID-19 Vaccine 11/06/2019  4:13 PM 0.3 mL 07/26/2019 Intramuscular   Manufacturer: [place]   Lot: [id number]   [place]: [id number]

## 2019-12-06 NOTE — Progress Notes (Deleted)
Assessment/Plan:   1.  Parkinsons Disease  -***Continue carbidopa/levodopa 25/100, 1 tablet 3 times per day  -Continue carbidopa/levodopa 50/200 at bedtime.  -Has been 0.5 mg, half tablet at bedtime.  This was added in addition to bedtime levodopa for cramping -We discussed that it used to be thought that levodopa would increase risk of melanoma but now it is believed that Parkinsons itself likely increases risk of melanoma. he is to get regular skin checks.  He follows with [facility] dermatology yearly. -he is following with dermatology. Last appt was yesterday (06/2019) at [facility] dermatology.  2. Gait instability -Patient feels a little bit more unstable since his recent surgery, but is looking forward to getting back to exercise with his personal trainer and rock steady boxing. He thinks once he does that, he will do better and I agree with that. 3. Cerebral small vessel disease -if able and no hx of GI bleed, recommend ASA, 81 mg EC daily. States that he is taking this three times per week.  4. 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 Physical therapy has helped. 5. Memory change -Last neurocognitive testing was in 2017 and results were largely normal.   6. Mild depression -Doing well with mirtazapine, 15 mg at night.  7.  B12 deficiency  -On supplementation.  Subjective:   Arabella Merles was seen today in follow up for Parkinsons disease.  My previous records were reviewed prior to todays visit as well as outside records available to me.  We added low-dose clonazepam last visit for cramping and he reports that ***. pt denies falls.  Pt denies lightheadedness, near syncope.  No hallucinations.  Mood has been good.  We did check some lab work last visit and found that he was B12 deficient.  He is now on supplementation.  He has been to physical  therapy since last visit.  Those notes have been reviewed.  Current prescribed movement disorder medications: Carbidopa/levodopa 25/100, 1 tablet 3 times per day Carbidopa/levodopa 50/200 at bedtime Mirtazapine, 15 mg at bedtime Clonazepam, 0.5 mg, half tablet at bedtime (added last visit for cramping)  PREVIOUS MEDICATIONS: {Parkinson's RX:18200}  ALLERGIES:  No Known Allergies  CURRENT MEDICATIONS:  Outpatient Encounter Medications as of 12/10/2019  Medication Sig  . b complex vitamins tablet Take 1 tablet by mouth daily.  . carbidopa-levodopa (SINEMET CR) 50-200 MG tablet TAKE 1 TABLET BY MOUTH EVERYDAY AT BEDTIME  . carbidopa-levodopa (SINEMET IR) 25-100 MG tablet TAKE 1 TABLET BY MOUTH THREE TIMES A DAY  . Cholecalciferol (VITAMIN D3) 1000 UNITS CAPS Take 1 capsule by mouth every evening.  . clonazePAM (KLONOPIN) 0.5 MG tablet Take 0.5 tablets (0.25 mg total) by mouth at bedtime.  . famotidine (PEPCID) 20 MG tablet Take 20 mg by mouth at bedtime. Takes pepcid ac  . Glucosamine Sulfate 1000 MG CAPS Take by mouth 2 (two) times daily.  . Magnesium 100 MG CAPS Take 1 capsule by mouth every evening.  . mirtazapine (REMERON) 15 MG tablet TAKE 1 TABLET BY MOUTH EVERYDAY AT BEDTIME  . Multiple Vitamins-Minerals (VITABASIC SENIOR PO) Take 1 tablet by mouth.  . Potassium 99 MG TABS Take by mouth.   No facility-administered encounter medications on file as of 12/10/2019.    Objective:   PHYSICAL EXAMINATION:    VITALS:  There were no vitals filed for this visit.  GEN:  The patient appears stated age and is in NAD. HEENT:  Normocephalic, atraumatic.  The mucous membranes  are moist. The superficial temporal arteries are without ropiness or tenderness. CV:  RRR Lungs:  CTAB Neck/HEME:  There are no carotid bruits bilaterally.  Neurological examination:  Orientation: The patient is alert and oriented x3. Cranial nerves: There is good facial symmetry with*** facial hypomimia. The  speech is fluent and clear. Soft palate rises symmetrically and there is no tongue deviation. Hearing is intact to conversational tone. Sensation: Sensation is intact to light touch throughout Motor: Strength is at least antigravity x4.  Movement examination: Tone: There is ***tone in the *** Abnormal movements: *** Coordination:  There is *** decremation with RAM's, *** Gait and Station: The patient has *** difficulty arising out of a deep-seated chair without the use of the hands. The patient's stride length is ***.  The patient has a *** pull test.    I have reviewed and interpreted the following labs independently Lab Results  Component Value Date   TSH 1.49 06/25/2019     Chemistry      Component Value Date/Time   NA 145 06/25/2019 1049   K 5.0 06/25/2019 1049   CL 109 06/25/2019 1049   CO2 27 06/25/2019 1049   BUN 16 06/25/2019 1049   CREATININE 0.86 06/25/2019 1049      Component Value Date/Time   CALCIUM 9.3 06/25/2019 1049   ALKPHOS 50 11/30/2015 1023   AST 21 06/25/2019 1049   ALT 16 06/25/2019 1049   BILITOT 0.5 06/25/2019 1049     Lab Results  Component Value Date   VITAMINB12 269 06/25/2019     Total time spent on today's visit was ***30 minutes, including both face-to-face time and nonface-to-face time.  Time included that spent on review of records (prior notes available to me/labs/imaging if pertinent), discussing treatment and goals, answering patient's questions and coordinating care.  Cc:  Lawerance Cruel, MD

## 2019-12-10 ENCOUNTER — Ambulatory Visit: Payer: Medicare Other | Admitting: Neurology

## 2019-12-21 ENCOUNTER — Other Ambulatory Visit: Payer: Self-pay | Admitting: Neurology

## 2019-12-26 ENCOUNTER — Ambulatory Visit: Payer: Medicare Other | Admitting: Neurology

## 2020-02-06 NOTE — Progress Notes (Signed)
Assessment/Plan:   1.  Parkinsons Disease -He will continue carbidopa/levodopa 25/100 three times per day -continue Carbidopa/levodopa 50/200 at bedtime.              -Continue clonazepam, 0.5 mg, half tablet at bedtime.  Has helped cramping and sleep  -We discussed that it used to be thought that levodopa would increase risk of melanoma but now it is believed that Parkinsons itself likely increases risk of melanoma. he is to get regular skin checks.  He follows regularly with dermatology.  -I emailed PT to check on his therapy screens  2.  Memory change -He had neuropsych testing with Dr. Si Raider on 07/19/2016. Results were largely normal, with some evidence of mild cognitive impairment. Having more word finding issues and may need to repeat in the future. We have been holding off on repeating as wife has not been here the last few visits to discuss her perception of memory. We talked extensively today about repeating that testing. He is going to talk to his wife about this. I don't think that he has dementia.   3.  Mild depression  -Doing well with mirtazapine, 15 mg at bed  4.    B12 deficiency  -On oral supplementation.    5.  Follow up is anticipated in the next 4-6 months, sooner should new neurologic issues arise.  Subjective:   Joshua Browning was seen today in follow up for Parkinsons disease.  My previous records were reviewed prior to todays visit as well as outside records available to me.  Clonazepam was added last visit for cramping and sleep.  He reports that he is sleeping very well.  He is generally doing better with cramping and wonders if the day he gets cramping he does more at work.  pt denies falls.  Has been to physical therapy since our last visit and those notes have been reviewed.  He does think that balance isn't quite as good as it was. Pt denies lightheadedness, near syncope.  No hallucinations.  Mood has been good.  We  did check his B12 last visit and it was low.  He was told to start on a supplement.  He is doing that.  Current prescribed movement disorder medications: Carbidopa/levodopa 25/100, 1 tablet 3 times per day Carbidopa/levodopa 50/200 at bedtime Mirtazapine, 15 mg at bedtime Clonazepam 0.5 mg, half a tablet at bedtime (added last visit for cramping and sleep) B12 supplement (told to start after last visit)  ALLERGIES:  No Known Allergies  CURRENT MEDICATIONS:  Outpatient Encounter Medications as of 02/10/2020  Medication Sig  . b complex vitamins tablet Take 1 tablet by mouth daily.  . carbidopa-levodopa (SINEMET CR) 50-200 MG tablet TAKE 1 TABLET BY MOUTH EVERYDAY AT BEDTIME  . carbidopa-levodopa (SINEMET IR) 25-100 MG tablet TAKE 1 TABLET BY MOUTH THREE TIMES A DAY  . Cholecalciferol (VITAMIN D3) 1000 UNITS CAPS Take 1 capsule by mouth every evening.  . clonazePAM (KLONOPIN) 0.5 MG tablet TAKE 0.5 TABLETS (0.25 MG TOTAL) BY MOUTH AT BEDTIME.  . famotidine (PEPCID) 20 MG tablet Take 20 mg by mouth at bedtime. Takes pepcid ac  . Glucosamine Sulfate 1000 MG CAPS Take by mouth daily.   . Magnesium 100 MG CAPS Take 1 capsule by mouth every evening.  . mirtazapine (REMERON) 15 MG tablet TAKE 1 TABLET BY MOUTH EVERYDAY AT BEDTIME  . Multiple Vitamins-Minerals (VITABASIC SENIOR PO) Take 1 tablet by mouth.  . Potassium 99 MG TABS Take by  mouth.   No facility-administered encounter medications on file as of 02/10/2020.    Objective:   PHYSICAL EXAMINATION:    VITALS:   Vitals:   02/10/20 1105  BP: (!) 143/83  Pulse: 77  SpO2: 97%  Weight: 161 lb (73 kg)  Height: 5\' 10"  (1.778 m)    GEN:  The patient appears stated age and is in NAD. HEENT:  Normocephalic, atraumatic.  The mucous membranes are moist.   Neurological examination:  Orientation: The patient is alert and oriented x3. Cranial nerves: There is good facial symmetry with min facial hypomimia. The speech is fluent and clear.  Soft palate rises symmetrically and there is no tongue deviation. Hearing is intact to conversational tone. Sensation: Sensation is intact to light touch throughout Motor: Strength is at least antigravity x4.  Movement examination: Tone: There is normal tone in the UE/LE Abnormal movements: none Coordination:  There is no decremation with RAM's, with any form of RAMS, including alternating supination and pronation of the forearm, hand opening and closing, finger taps, heel taps and toe taps. Gait and Station: The patient has no difficulty arising out of a deep-seated chair without the use of the hands. The patient's stride length is slightly decreased and he turned fast and had a near fall but got onto the back of his heels and quickly recovered.    I have reviewed and interpreted the following labs independently    Chemistry      Component Value Date/Time   NA 145 06/25/2019 1049   K 5.0 06/25/2019 1049   CL 109 06/25/2019 1049   CO2 27 06/25/2019 1049   BUN 16 06/25/2019 1049   CREATININE 0.86 06/25/2019 1049      Component Value Date/Time   CALCIUM 9.3 06/25/2019 1049   ALKPHOS 50 11/30/2015 1023   AST 21 06/25/2019 1049   ALT 16 06/25/2019 1049   BILITOT 0.5 06/25/2019 1049       Lab Results  Component Value Date   HGB 13.1 09/24/2013   HCT 38.4 (L) 09/24/2013    Lab Results  Component Value Date   TSH 1.49 06/25/2019   Lab Results  Component Value Date   VITAMINB12 269 06/25/2019    Total time spent on today's visit was 30 minutes, including both face-to-face time and nonface-to-face time.  Time included that spent on review of records (prior notes available to me/labs/imaging if pertinent), discussing treatment and goals, answering patient's questions and coordinating care.  Cc:  Lawerance Cruel, MD

## 2020-02-10 ENCOUNTER — Ambulatory Visit: Payer: Medicare Other | Admitting: Neurology

## 2020-02-10 ENCOUNTER — Other Ambulatory Visit: Payer: Self-pay

## 2020-02-10 ENCOUNTER — Encounter: Payer: Self-pay | Admitting: Neurology

## 2020-02-10 VITALS — BP 143/83 | HR 77 | Ht 70.0 in | Wt 161.0 lb

## 2020-02-10 DIAGNOSIS — G20 Parkinson's disease: Secondary | ICD-10-CM

## 2020-02-18 ENCOUNTER — Other Ambulatory Visit: Payer: Self-pay | Admitting: Neurology

## 2020-03-24 ENCOUNTER — Other Ambulatory Visit: Payer: Self-pay | Admitting: Neurology

## 2020-05-14 ENCOUNTER — Ambulatory Visit: Payer: Medicare Other | Attending: Family Medicine | Admitting: Physical Therapy

## 2020-05-14 ENCOUNTER — Ambulatory Visit: Payer: Medicare Other

## 2020-05-14 ENCOUNTER — Telehealth: Payer: Self-pay | Admitting: Occupational Therapy

## 2020-05-14 ENCOUNTER — Ambulatory Visit: Payer: Medicare Other | Admitting: Occupational Therapy

## 2020-05-14 ENCOUNTER — Other Ambulatory Visit: Payer: Self-pay

## 2020-05-14 DIAGNOSIS — R471 Dysarthria and anarthria: Secondary | ICD-10-CM | POA: Insufficient documentation

## 2020-05-14 DIAGNOSIS — R29818 Other symptoms and signs involving the nervous system: Secondary | ICD-10-CM | POA: Insufficient documentation

## 2020-05-14 DIAGNOSIS — R2689 Other abnormalities of gait and mobility: Secondary | ICD-10-CM | POA: Insufficient documentation

## 2020-05-14 DIAGNOSIS — G20 Parkinson's disease: Secondary | ICD-10-CM

## 2020-05-14 NOTE — Telephone Encounter (Signed)
Dr. Carles Collet,   Joshua Browning was seen for Parkinsons OT, PT, and ST screens 05/14/20.   Pt may benefit from OT referral (due to difficulty with ADLs, bradykinesia, decr coodination) and  speech therapy referral (due to decr voice volume).  If you are in agreement, please send updated order for Speech therapy, Occupational therapy, and Physical therapy via epic.  Thank you,   Vianne Bulls, OTR/L [facility] [address] [place], [place]  [address] [phone] phone [phone] 05/14/20 11:55 AM

## 2020-05-14 NOTE — Therapy (Signed)
[facility] [address] [place], [place], [address] Phone: [phone]   Fax:  [phone]  Patient Details  Name: Joshua Browning MRN: [id number] Date of Birth: Jul 10, 1946 Referring Provider:  Ludwig Clarks, DO  Encounter Date: 05/14/2020   Occupational Therapy Parkinson's Disease Screen  Hand dominance:  RUE   Physical Performance Test item #4 (donning/doffing jacket):  11.75 sec  Fastening/unfastening 3 buttons in:  38.75sec  9-hole peg test:    RUE  35.88  sec        LUE  33.44 sec  Box & Blocks test:  R-37blocks, L-40blocks  Change in ability to perform ADLs/IADLs:  Pt reports writing is not as good (micrographia noted at times), buttoning harder.  Pt works at [employer].    Other Comments:  Tremors noted with movement  Pt would benefit from occupational therapy evaluation due to  Changes in ADLs, difficulty with fine motor tasks, bradykinesia.    [facility] 05/14/2020, 10:40 AM  [facility] [address] [place], [place], [address] Phone: [phone]   Fax:  [place], OTR/L [facility] [address] [place], [place]  [address] [phone] phone [phone] 05/14/20 10:40 AM

## 2020-05-14 NOTE — Therapy (Signed)
[facility] [address] [place], [place], [address] Phone: [phone]   Fax:  [phone]  Patient Details  Name: Joshua Browning MRN: [id number] Date of Birth: 1945/09/07 Referring Provider:  Lawerance Cruel, MD  Encounter Date: 05/14/2020  Physical Therapy Parkinson's Disease Screen   Timed Up and Go test: 12.57 sec  10 meter walk test:10.78 sec = 3.04 ft/sec  5 time sit to stand test: 13.66 sec   Patient does not require Physical Therapy services at this time.  Recommend Physical Therapy screen in 6-8 months.  Pt reports no falls, but not being as active in general recently.  PT recommends getting back into his exercise routine, and if he notes additional issues prior to next screen, to request PT order from MD.   Frazier Butt. 05/14/2020, 10:19 AM  [facility] [address] [place], [place], [address] Phone: [phone]   Fax:  [phone]

## 2020-05-14 NOTE — Therapy (Signed)
[facility] [address] [place], [place], [address] Phone: [phone]   Fax:  [phone]  Patient Details  Name: Joshua Browning MRN: [id number] Date of Birth: 1945/08/17 Referring Provider:  Ludwig Clarks, DO  Encounter Date: 05/14/2020   Speech Therapy Parkinson's Disease Screen   Decibel Level today: upper 60s dB  (WNL=70-72 dB) with sound level meter 30cm away from pt's masked mouth. Pt's conversational volume is lower than normal. Pt also reports wife states he gets softer after noon.   Pt does not report difficulty in swallowing warranting further evaluation at this time.  Pt would benefit from speech-language eval for dysarthria - please order via EPIC to schedule.    [facility] ,[place], [place]  05/14/2020, 10:37 AM  [facility] [address] [place], [place], [address] Phone: [phone]   Fax:  [phone]

## 2020-05-19 NOTE — Telephone Encounter (Signed)
Orders were placed for all three therapies on 05/14/2020.

## 2020-06-02 ENCOUNTER — Ambulatory Visit: Payer: Medicare Other

## 2020-06-02 ENCOUNTER — Other Ambulatory Visit: Payer: Self-pay

## 2020-06-02 ENCOUNTER — Encounter: Payer: Self-pay | Admitting: Occupational Therapy

## 2020-06-02 ENCOUNTER — Ambulatory Visit: Payer: Medicare Other | Attending: Neurology | Admitting: Occupational Therapy

## 2020-06-02 DIAGNOSIS — R471 Dysarthria and anarthria: Secondary | ICD-10-CM

## 2020-06-02 DIAGNOSIS — M25622 Stiffness of left elbow, not elsewhere classified: Secondary | ICD-10-CM | POA: Diagnosis present

## 2020-06-02 DIAGNOSIS — R2681 Unsteadiness on feet: Secondary | ICD-10-CM | POA: Insufficient documentation

## 2020-06-02 DIAGNOSIS — M25621 Stiffness of right elbow, not elsewhere classified: Secondary | ICD-10-CM

## 2020-06-02 DIAGNOSIS — R41841 Cognitive communication deficit: Secondary | ICD-10-CM

## 2020-06-02 DIAGNOSIS — R2689 Other abnormalities of gait and mobility: Secondary | ICD-10-CM | POA: Diagnosis present

## 2020-06-02 DIAGNOSIS — R29818 Other symptoms and signs involving the nervous system: Secondary | ICD-10-CM | POA: Insufficient documentation

## 2020-06-02 DIAGNOSIS — R293 Abnormal posture: Secondary | ICD-10-CM | POA: Diagnosis present

## 2020-06-02 DIAGNOSIS — R278 Other lack of coordination: Secondary | ICD-10-CM | POA: Diagnosis present

## 2020-06-02 NOTE — Therapy (Signed)
[facility] [address] [place], [place], [address] Phone: [phone]   Fax:  [phone]  Occupational Therapy Evaluation  Patient Details  Name: Joshua Browning MRN: [id number] Date of Birth: [birth year] Referring Provider (OT): Dr. Carles Collet   Encounter Date: 06/02/2020   OT End of Session - 06/02/20 1510    Visit Number 1    Number of Visits 17    Date for OT Re-Evaluation 07/28/20    Authorization Type [employer] Medicare    Authorization Time Period 12 weeks (pt is only scheduled for 6 weeks)    Authorization - Visit Number 1    Authorization - Number of Visits 10    OT Start Time 7858    OT Stop Time 1145    OT Time Calculation (min) 40 min    Activity Tolerance Patient tolerated treatment well    Behavior During Therapy [facility] for tasks assessed/performed           Past Medical History:  Diagnosis Date  . BPH (benign prostatic hyperplasia)   . Foley catheter in place    removed after turp  . GERD (gastroesophageal reflux disease)   . Nocturnal leg cramps    occ  . Urinary retention   . Wears glasses     Past Surgical History:  Procedure Laterality Date  . CATARACT EXTRACTION W/ INTRAOCULAR LENS  IMPLANT, BILATERAL Bilateral   . INGUINAL HERNIA REPAIR Bilateral RIGHT x 2 8502 umbilical done with last right   left last one done 10 yrs ago  . RE-DO RIGHT INGUINAL HERNIA REPAIR AND UMBILICAL HERNIA REPAIR  2001  . REMOVAL LEFT EYE INTRAOCULAR LENS AND REPLACEMENT   08-29-2013  . TRANSURETHRAL RESECTION OF PROSTATE N/A 09/23/2013   Procedure: TRANSURETHRAL RESECTION OF THE PROSTATE (TURP) WITH GYRUS;  Surgeon: Claybon Jabs, MD;  Location: [facility];  Service: Urology;  Laterality: N/A;  . VARICOCELECTOMY Left 07/02/2018   Procedure: VARICOCELECTOMY;  Surgeon: Kathie Rhodes, MD;  [facility];  Service: Urology;  Laterality: Left;    There were no vitals filed for this  visit.   Subjective Assessment - 06/02/20 1111    Subjective  Pt denies pain    Pertinent History PD    Patient Stated Goals to fasten buttons and write better    Currently in Pain? No/denies             [facility] OT Assessment - 06/02/20 1112      Assessment   Medical Diagnosis Parkinson's disease    Referring Provider (OT) Dr. Carles Collet    Onset Date/Surgical Date 05/14/20      Precautions   Precautions Fall      Balance Screen   Has the patient fallen in the past 6 months No    Has the patient had a decrease in activity level because of a fear of falling?  No    Is the patient reluctant to leave their home because of a fear of falling?  No      Home  Environment   Family/patient expects to be discharged to: Private residence    Home Access Stairs    Bathroom Shower/Tub [place] Part time employment    Vocation Requirements auto auction      ADL   Eating/Feeding Modified independent    [place]  independent    Upper Body Bathing Modified independent    Lower Body Bathing Modified independent    Upper Body Dressing Increased time;Independent   increased time, difficulty with buttons   Lower Body Dressing Modified independent;Increased time    Medical laboratory scientific officer Modified independent      IADL   Shopping Takes care of all shopping needs independently    Light Housekeeping Performs light daily tasks such as dishwashing, bed making    Meal Prep Able to complete simple cold meal and snack prep    Medication Management Is responsible for taking medication in correct dosages at correct time    Financial Management --   uses online banking     Mobility   Mobility Status Independent      Written Expression   Dominant Hand Right    Handwriting Mild micrographia;100% legible      Cognition   Overall Cognitive Status  Impaired/Different from baseline    Memory Impaired   difficulty remembering    Memory Impairment Decreased short term memory      Observation/Other Assessments   Other Surveys  Select    Physical Performance Test   Yes    Simulated Eating Time (seconds) 11.75 secs    Donning Doffing Jacket Time (seconds) 20.47 secs    Donning Doffing Jacket Comments 3 button/ unbutton: 41.59      Coordination   9 Hole Peg Test Right;Left    Right 9 Hole Peg Test 27.09 secs    Left 9 Hole Peg Test 32.53    Box and Blocks RUE 45 LUE 41    Tremors bilateral RUE is more pronounced than left       ROM / Strength   AROM / PROM / Strength AROM      AROM   Overall AROM  Deficits    Overall AROM Comments RUE shoulder flexion 150, elbow extension -15, LUE shoulder flexion 130, elbow extension -10                             OT Short Term Goals - 06/02/20 1524      OT SHORT TERM GOAL #1   Title Pt will verbalize understanding of adapted strategies to maximize safety and I with ADLs/ IADLs .    Time 4    Period Weeks    Status New      OT SHORT TERM GOAL #2   Title I with PD specific HEP    Time 4    Period Weeks    Status New      OT SHORT TERM GOAL #3   Title Pt will demonstrate improved fine motor coordination for ADLs as evidenced by decreasing 9 hole peg test score for LUE by 3 secs    Time 4    Period Weeks    Status New      OT SHORT TERM GOAL #4   Title Pt will demonstrate increased ease with dressing as eveidnced by decreasing PPT#4(don/ doff jacket) to 17 secs or less    Time 4    Period Weeks    Status New      OT SHORT TERM GOAL #5   Title Pt will demonstrate improved ease with dressing as eveidenced by decreasing 3 button/ unbutton time to 35 secs or less    Time 4    Period Weeks    Status New  OT Long Term Goals - 06/02/20 1526      OT LONG TERM GOAL #1   Title Pt will demonstrate understanding of memory compensations and ways to  keep thinking skills sharp    Time 8    Period Weeks    Status New      OT LONG TERM GOAL #2   Title Pt will write a short paragraph with 100% legibility and no significant decrease in letter size    Time 8    Period Weeks    Status New      OT LONG TERM GOAL #3   Title Pt will verbalize understanding of ways to prevent future PD related complications and PD community resources.    Time 8    Period Weeks    Status New      OT LONG TERM GOAL #4   Title Pt will demonstrate ability to retrieve a lightweight object from overhead shelf with RUE with elbow extension -10 or less.    Time 8    Period Weeks    Status New                 Plan - 06/02/20 1511    Clinical Impression Statement Pt is a 73 year old male who presents to [facility] with history of Parkinson's disease, Pt was recently seen for Parkinson's disease screen and he can benefit from skilled occupational therapy due to a decline in ADLs. He remains active at his part-time job and enjoyed exercise classes/personal trainer sessions prior to COVID-19 pandemic.  He will benefit from skilled OT to address the following deficits: decreased coordination, bradykinesia, decreased functional mobility, decreased balance, decreased ROM, which impedes performance of ADLs/ IADLs. Pt can benefit from skilled occupational therapy to address these deficits in order to maximize pt's safety and I with daily activities.  PMH: BPH, GERD    OT Occupational Profile and History Detailed Assessment- Review of Records and additional review of physical, cognitive, psychosocial history related to current functional performance    Occupational performance deficits (Please refer to evaluation for details): ADL's;IADL's;Work;Play;Leisure;Social Participation    Body Structure / Function / Physical Skills ADL;UE functional use;Flexibility;FMC;ROM;GMC;Coordination;IADL;Dexterity;Decreased knowledge of use of DME;Strength;Mobility;Endurance;Balance;Gait     Cognitive Skills Memory    Rehab Potential Good    Clinical Decision Making Limited treatment options, no task modification necessary    Comorbidities Affecting Occupational Performance: May have comorbidities impacting occupational performance    Modification or Assistance to Complete Evaluation  No modification of tasks or assist necessary to complete eval    OT Frequency 2x / week   plus eval, may d/c after 6 weeks   OT Duration 8 weeks    OT Treatment/Interventions Self-care/ADL training;Ultrasound;DME and/or AE instruction;Patient/family education;Balance training;Passive range of motion;Gait Training;Cryotherapy;Fluidtherapy;Functional Mobility Training;Moist Heat;Therapeutic exercise;Manual Therapy;Cognitive remediation/compensation;Neuromuscular education    Plan initiate HEP, consider PWR! seated, coordination HEP    Consulted and Agree with Plan of Care Patient           Patient will benefit from skilled therapeutic intervention in order to improve the following deficits and impairments:   Body Structure / Function / Physical Skills: ADL, UE functional use, Flexibility, FMC, ROM, GMC, Coordination, IADL, Dexterity, Decreased knowledge of use of DME, Strength, Mobility, Endurance, Balance, Gait Cognitive Skills: Memory     Visit Diagnosis: Other lack of coordination - Plan: Ot plan of care cert/re-cert  Other symptoms and signs involving the nervous system - Plan: Ot plan of care cert/re-cert  Abnormal posture - Plan: Ot plan of care cert/re-cert  Unsteadiness on feet - Plan: Ot plan of care cert/re-cert  Other abnormalities of gait and mobility - Plan: Ot plan of care cert/re-cert  Stiffness of left elbow, not elsewhere classified - Plan: Ot plan of care cert/re-cert  Stiffness of right elbow, not elsewhere classified - Plan: Ot plan of care cert/re-cert    Problem List Patient Active Problem List   Diagnosis Date Noted  . BPH (benign prostatic hypertrophy) with  urinary retention 09/23/2013    Donne Robillard 06/02/2020, 3:54 PM  [facility] [address] [place], [place], [address] Phone: [phone]   Fax:  [phone]  Name: AINSLEY SANGUINETTI MRN: [id number] Date of Birth: [birth year]

## 2020-06-02 NOTE — Therapy (Signed)
Joshua Browning [address] [place], [place], [address] Phone: [phone]   Fax:  [phone]  Speech Language Pathology Evaluation  Patient Details  Name: Joshua Browning MRN: [id number] Date of Birth: June 01, 1946 Referring Provider (SLP): Joshua Bogus, DO   Encounter Date: 06/02/2020   End of Session - 06/02/20 1347    Visit Number 1    Number of Visits 17    Date for SLP Re-Evaluation 08/28/20    SLP Start Time 1150    SLP Stop Time  6433    SLP Time Calculation (min) 40 min           Past Medical History:  Diagnosis Date  . BPH (benign prostatic hyperplasia)   . Foley catheter in place    removed after turp  . GERD (gastroesophageal reflux disease)   . Nocturnal leg cramps    occ  . Urinary retention   . Wears glasses     Past Surgical History:  Procedure Laterality Date  . CATARACT EXTRACTION W/ INTRAOCULAR LENS  IMPLANT, BILATERAL Bilateral   . INGUINAL HERNIA REPAIR Bilateral RIGHT x 2 2951 umbilical done with last right   left last one done 10 yrs ago  . RE-DO RIGHT INGUINAL HERNIA REPAIR AND UMBILICAL HERNIA REPAIR  2001  . REMOVAL LEFT EYE INTRAOCULAR LENS AND REPLACEMENT   08-29-2013  . TRANSURETHRAL RESECTION OF PROSTATE N/A 09/23/2013   Procedure: TRANSURETHRAL RESECTION OF THE PROSTATE (TURP) WITH GYRUS;  Surgeon: Joshua Jabs, MD;  Location: [facility];  Service: Urology;  Laterality: N/A;  . VARICOCELECTOMY Left 07/02/2018   Procedure: VARICOCELECTOMY;  Surgeon: Joshua Rhodes, MD;  Location: [facility];  Service: Urology;  Laterality: Left;    There were no vitals filed for this visit.   Subjective Assessment - 06/02/20 1156    Subjective "I guess the concern is that as the day goes on I get hoarse, and also softer on towards the evening."    Currently in Pain? No/denies              SLP Evaluation Joshua Browning - 06/02/20 1156      SLP Visit Information    SLP Received On 06/02/20    Referring Provider (SLP) Tat, Joshua Guiles, DO    Onset Date "roughly three years"    Medical Diagnosis Parkinsons Disease      Subjective   Patient/Family Stated Goal "I'd like to be able to communicate at a level that people don't ask me 'What did you say?', or my wife says she can't hear me."      General Information   HPI Pt diagnosed approximately 2018. Pt reports that he gets hoarse and softer throughout the day.       Cognition   Overall Cognitive Status Impaired/Different from baseline    Area of Impairment Memory    Memory Impaired    Memory Impairment Decreased short term memory      Auditory Comprehension   Overall Auditory Comprehension Appears within functional limits for tasks assessed      Verbal Expression   Overall Verbal Expression Appears within functional limits for tasks assessed      Oral Motor/Sensory Function   Overall Oral Motor/Sensory Function Appears within functional limits for tasks assessed    Labial Coordination WFL    Lingual Coordination Reduced      Motor Speech   Phonation Hoarse;Low vocal intensity   upper 60s-low 70s dB; min hoarse  Intelligibility Intelligible    Effective Techniques Increased vocal intensity   low 70s in sentences x5; short, simple conversation 30 seconds- pt was unsuccessful at maintaining WNL volume level                          SLP Education - 06/02/20 1346    Education Details loud /a/ and rationale, need to think of incr'd volume and effort with conversation    Person(s) Educated Patient    Methods Explanation;Demonstration;Verbal cues;Handout    Comprehension Verbalized understanding;Returned demonstration;Verbal cues required;Need further instruction            SLP Short Term Goals - 06/02/20 1352      SLP SHORT TERM GOAL #1   Title pt will answer questions about his swallowing safety at mealtimes    Time 2    Period Weeks    Status New      SLP SHORT TERM  GOAL #2   Title pt will produce loud /a/ or "hey!" with at least mid-upper 80s dB average over three sessions    Time 4    Period Weeks    Status New      SLP SHORT TERM GOAL #3   Title Pt will produce 3 minutes simple conversation with WNL volume over three sessions    Time 4    Period Weeks    Status New      SLP SHORT TERM GOAL #4   Title pt will track appointment schedule and/or to-do lists via external cues during 4 sessions    Time 4    Period Weeks    Status New            SLP Long Term Goals - 06/02/20 1356      SLP LONG TERM GOAL #1   Title Pt will produce 10 minutes simple-mod complex conversation with WNL volume over three sessions    Time 8    Period Weeks   or 17 total sessions, for all LTGs   Status New      SLP LONG TERM GOAL #2   Title pt will produce loud /a/ or "hey!" with upper 80s dB average over three sessions    Time 8    Period Weeks    Status New      SLP LONG TERM GOAL #3   Title Pt will tell SLP 3 overt s/sx aspiration in 2 sessions with modified independence    Time 8    Period Weeks    Status New            Plan - 06/02/20 1207    Clinical Impression Statement Joshua Browning demonstrated an average loudness of upper 60's - low 70s dB in a 10 minutes monologue. He reports that he has more difficulty with short term memory than prior to Parkinsons diagnosis. Much of session was spent training pt with loud /a/, and ongoing training may still be necessary - pt today reached mid 80s dB average with loud /a/. Ultimately SLP believes pt performed loud /a/ sufficiently today to complete at home x5 reps BID. When pt was told to think of incr'd loudness and effort with his speech necessary to improve loudness to consistently WNL, he was unable to do so in short segments of simple conversation x2. He WAS able to transfer incr'd loudness and effort to sentence level responses 100% (5/5). Joshua Browning communicated that loud intelligible speech is also important at  his  job, when he is on the radio and face to face communication. SLP believes pt would benefit from skilled ST targeting improved speech loudness.    Speech Therapy Frequency 2x / week    Duration 8 weeks   or 17 total sessions   Treatment/Interventions Functional tasks;Compensatory techniques;SLP instruction and feedback;Compensatory strategies;Internal/external aids;Patient/family education;Diet toleration management by SLP;Trials of upgraded texture/liquids;Cueing hierarchy;Cognitive reorganization;Pharyngeal strengthening exercises;Aspiration precaution training;Environmental controls    Potential to Achieve Goals Good    SLP Home Exercise Plan provided    Consulted and Agree with Plan of Care Patient           Patient will benefit from skilled therapeutic intervention in order to improve the following deficits and impairments:   Dysarthria and anarthria - Plan: SLP plan of care cert/re-cert  Cognitive communication deficit - Plan: SLP plan of care cert/re-cert    Problem List Patient Active Problem List   Diagnosis Date Noted  . BPH (benign prostatic hypertrophy) with urinary retention 09/23/2013    [facility] ,[place], CCC-SLP  06/02/2020, 2:01 PM  [facility] [address] [place], [place], [address] Phone: [phone]   Fax:  [phone]  Name: Joshua Browning MRN: [id number] Date of Birth: [birth year]

## 2020-06-02 NOTE — Patient Instructions (Signed)
   Perform 5 loud "ah"s twice a day. Think of that extra effort needed!

## 2020-06-05 ENCOUNTER — Ambulatory Visit: Payer: Medicare Other | Admitting: Speech Pathology

## 2020-06-05 ENCOUNTER — Other Ambulatory Visit: Payer: Self-pay

## 2020-06-05 ENCOUNTER — Encounter: Payer: Self-pay | Admitting: Speech Pathology

## 2020-06-05 DIAGNOSIS — R471 Dysarthria and anarthria: Secondary | ICD-10-CM

## 2020-06-05 DIAGNOSIS — R278 Other lack of coordination: Secondary | ICD-10-CM | POA: Diagnosis not present

## 2020-06-05 NOTE — Patient Instructions (Signed)
   Continue loud "Hey Ah!" 5x twice a day, followed by 5 minutes of reading focusing on breath and volume (like you want someone to hear you in the next room)  When someone asks you to repeat yourself, take a big breath first and give your voice power  As you start to give your voice some power, you may feel like your talking too loud - this is normal and OK  Notice as you give your voice power, the hoarseness is reduced or eliminated

## 2020-06-05 NOTE — Therapy (Signed)
[facility] [address] [place], [place], [address] Phone: [phone]   Fax:  [phone]  Speech Language Pathology Treatment  Patient Details  Name: WILBON OBENCHAIN MRN: [id number] Date of Birth: 1945/12/04 Referring Provider (SLP): Alonza Bogus, DO   Encounter Date: 06/05/2020   End of Session - 06/05/20 1614    Visit Number 2    Number of Visits 17    Date for SLP Re-Evaluation 08/28/20    SLP Start Time 9563    SLP Stop Time  1615    SLP Time Calculation (min) 44 min    Activity Tolerance Patient tolerated treatment well           Past Medical History:  Diagnosis Date  . BPH (benign prostatic hyperplasia)   . Foley catheter in place    removed after turp  . GERD (gastroesophageal reflux disease)   . Nocturnal leg cramps    occ  . Urinary retention   . Wears glasses     Past Surgical History:  Procedure Laterality Date  . CATARACT EXTRACTION W/ INTRAOCULAR LENS  IMPLANT, BILATERAL Bilateral   . INGUINAL HERNIA REPAIR Bilateral RIGHT x 2 8756 umbilical done with last right   left last one done 10 yrs ago  . RE-DO RIGHT INGUINAL HERNIA REPAIR AND UMBILICAL HERNIA REPAIR  2001  . REMOVAL LEFT EYE INTRAOCULAR LENS AND REPLACEMENT   08-29-2013  . TRANSURETHRAL RESECTION OF PROSTATE N/A 09/23/2013   Procedure: TRANSURETHRAL RESECTION OF THE PROSTATE (TURP) WITH GYRUS;  Surgeon: Claybon Jabs, MD;  Location: [facility];  Service: Urology;  Laterality: N/A;  . VARICOCELECTOMY Left 07/02/2018   Procedure: VARICOCELECTOMY;  Surgeon: Kathie Rhodes, MD;  Location: [facility];  Service: Urology;  Laterality: Left;    There were no vitals filed for this visit.   Subjective Assessment - 06/05/20 1537    Subjective "My wife says I get lower and hoarse in the evening"    Currently in Pain? No/denies                 ADULT SLP TREATMENT - 06/05/20 1538      General  Information   Behavior/Cognition Alert;Cooperative;Pleasant mood      Cognitive-Linquistic Treatment   Treatment focused on Dysarthria;Patient/family/caregiver education    Skilled Treatment Pt enters room with average 67db, loud /a/ averaged 75dB. With modeling and frequent mod A, pt achieved 80dB average with "hey a" to improve volume. In paragraph reading, Raedyn reqiured usual mod A for breath support. Soham was able to hear his volume decline. I pointed out that his voice is hoarse when he looses volume. In structured task rerquiring reading and sponatneous speech, Brallan frequently required cues for breath support and volume,  averaging 70dB. In conversation re: cars, pt maintained average of 69dB over 5 minutes with visual cues occasionally.       Progression Toward Goals   Progression toward goals Progressing toward goals            SLP Education - 06/05/20 1610    Education Details HEP for dysarthria    Person(s) Educated Patient    Methods Explanation;Demonstration;Verbal cues;Handout    Comprehension Verbalized understanding;Returned demonstration;Verbal cues required;Tactile cues required            SLP Short Term Goals - 06/05/20 1613      SLP SHORT TERM GOAL #1   Title pt will answer questions about his swallowing safety at mealtimes  Time 2    Period Weeks    Status On-going      SLP SHORT TERM GOAL #2   Title pt will produce loud /a/ or "hey!" with at least mid-upper 80s dB average over three sessions    Time 4    Period Weeks    Status On-going      SLP SHORT TERM GOAL #3   Title Pt will produce 3 minutes simple conversation with WNL volume over three sessions    Time 4    Period Weeks    Status On-going      SLP SHORT TERM GOAL #4   Title pt will track appointment schedule and/or to-do lists via external cues during 4 sessions    Time 4    Period Weeks    Status On-going            SLP Long Term Goals - 06/05/20 1614      SLP LONG TERM GOAL  #1   Title Pt will produce 10 minutes simple-mod complex conversation with WNL volume over three sessions    Time 8    Period Weeks   or 17 total sessions, for all LTGs   Status On-going      SLP LONG TERM GOAL #2   Title pt will produce loud /a/ or "hey!" with upper 80s dB average over three sessions    Time 8    Period Weeks    Status On-going      SLP LONG TERM GOAL #3   Title Pt will tell SLP 3 overt s/sx aspiration in 2 sessions with modified independence    Time 8    Period Weeks    Status On-going            Plan - 06/05/20 1612    Clinical Impression Statement Mr. Mcneel demonstrated an average loudness of upper 60's - low 70s dB in a 10 minutes monologue. He reports that he has more difficulty with short term memory than prior to Parkinsons diagnosis. Much of session was spent training pt with loud /a/, and ongoing training may still be necessary - pt today reached mid 80s dB average with loud /a/. Ultimately SLP believes pt performed loud /a/ sufficiently today to complete at home x5 reps BID. When pt was told to think of incr'd loudness and effort with his speech necessary to improve loudness to consistently WNL, he was unable to do so in short segments of simple conversation x2. He WAS able to transfer incr'd loudness and effort to sentence level responses 100% (5/5). Garrell communicated that loud intelligible speech is also important at his job, when he is on the radio and face to face communication. SLP believes pt would benefit from skilled ST targeting improved speech loudness.    Speech Therapy Frequency 2x / week    Duration 8 weeks   8 weeks or !7 visits   Treatment/Interventions Functional tasks;Compensatory techniques;SLP instruction and feedback;Compensatory strategies;Internal/external aids;Patient/family education;Diet toleration management by SLP;Trials of upgraded texture/liquids;Cueing hierarchy;Cognitive reorganization;Pharyngeal strengthening exercises;Aspiration  precaution training;Environmental controls    Potential to Achieve Goals Good           Patient will benefit from skilled therapeutic intervention in order to improve the following deficits and impairments:   Dysarthria and anarthria    Problem List Patient Active Problem List   Diagnosis Date Noted  . BPH (benign prostatic hypertrophy) with urinary retention 09/23/2013    Dyllan Hughett, Annye Rusk MS, CCC-SLP 06/05/2020, 4:15 PM  [facility] [address] [place], [place], [address] Phone: [phone]   Fax:  [phone]   Name: CASHIUS GRANDSTAFF MRN: [id number] Date of Birth: Sep 24, 1945

## 2020-06-09 ENCOUNTER — Ambulatory Visit: Payer: Medicare Other

## 2020-06-09 ENCOUNTER — Other Ambulatory Visit: Payer: Self-pay

## 2020-06-09 DIAGNOSIS — R471 Dysarthria and anarthria: Secondary | ICD-10-CM

## 2020-06-09 DIAGNOSIS — R41841 Cognitive communication deficit: Secondary | ICD-10-CM

## 2020-06-09 DIAGNOSIS — R278 Other lack of coordination: Secondary | ICD-10-CM | POA: Diagnosis not present

## 2020-06-09 NOTE — Patient Instructions (Signed)
   Continue with 5 repetitions of "HEY-AHHHH" twice a day. You WILL shout with this!  Practice by reading out loud for 30-seconds, 7-8 times, each day. You will FEEL like you're shouting with these

## 2020-06-09 NOTE — Therapy (Signed)
[facility] [address] [place], [place], [address] Phone: [phone]   Fax:  [phone]  Speech Language Pathology Treatment  Patient Details  Name: Joshua Browning MRN: [id number] Date of Birth: 02/17/1946 Referring Provider (SLP): Joshua Bogus, DO   Encounter Date: 06/09/2020   End of Session - 06/09/20 2252    Visit Number 3    Number of Visits 17    Date for SLP Re-Evaluation 08/28/20    SLP Start Time 1449    SLP Stop Time  1530    SLP Time Calculation (min) 41 min    Activity Tolerance Patient tolerated treatment well           Past Medical History:  Diagnosis Date  . BPH (benign prostatic hyperplasia)   . Foley catheter in place    removed after turp  . GERD (gastroesophageal reflux disease)   . Nocturnal leg cramps    occ  . Urinary retention   . Wears glasses     Past Surgical History:  Procedure Laterality Date  . CATARACT EXTRACTION W/ INTRAOCULAR LENS  IMPLANT, BILATERAL Bilateral   . INGUINAL HERNIA REPAIR Bilateral RIGHT x 2 6503 umbilical done with last right   left last one done 10 yrs ago  . RE-DO RIGHT INGUINAL HERNIA REPAIR AND UMBILICAL HERNIA REPAIR  2001  . REMOVAL LEFT EYE INTRAOCULAR LENS AND REPLACEMENT   08-29-2013  . TRANSURETHRAL RESECTION OF PROSTATE N/A 09/23/2013   Procedure: TRANSURETHRAL RESECTION OF THE PROSTATE (TURP) WITH GYRUS;  Surgeon: Joshua Jabs, MD;  Location: [facility];  Service: Urology;  Laterality: N/A;  . VARICOCELECTOMY Left 07/02/2018   Procedure: VARICOCELECTOMY;  Surgeon: Joshua Rhodes, MD;  Location: [facility];  Service: Urology;  Laterality: Left;    There were no vitals filed for this visit.   Subjective Assessment - 06/09/20 1453    Subjective "I seem to have a little more hoarseness during this time of day."    Currently in Pain? No/denies                 ADULT SLP TREATMENT - 06/09/20 1453       General Information   Behavior/Cognition Alert;Cooperative;Pleasant mood      Cognitive-Linquistic Treatment   Treatment focused on Dysarthria;Patient/family/caregiver education    Skilled Treatment Pt enters room with subWNL volume, in mid-upper 60s db; With modeling initially and occasional min A, pt achieved low 80s dB average with "hey ahhhhh" (held 3 seconds) to improve conversational volume. In paragraph reading, Joshua Browning reqiured usual mod A for breath support, taking sentence by sentence. Joshua Browning was able to hear his volume decline and his hoarseness begin - SLP made it clear to him that at thes times his volume dipped down below WNL. Pt indicated practice times he could utilize during the day. In structured task rerquiring reading and sponatneous speech, "This is great, I'm learning a lot." Pt acknowledged he needs to talk "too loud" today in order to attain WNL volume.       Assessment / Recommendations / Plan   Plan Continue with current plan of care      Progression Toward Goals   Progression toward goals Progressing toward goals            SLP Education - 06/09/20 2252    Education Details need to talk too loud in order to achieve WNL volume, role of abdominal musculature in WNL volume    Person(s)  Educated Patient    Methods Explanation;Demonstration    Comprehension Verbalized understanding;Returned demonstration;Verbal cues required;Need further instruction            SLP Short Term Goals - 06/09/20 2255      SLP SHORT TERM GOAL #1   Title pt will answer questions about his swallowing safety at mealtimes    Time 1    Period Weeks    Status On-going      SLP SHORT TERM GOAL #2   Title pt will produce loud /a/ or "hey!" with at least mid-upper 80s dB average over three sessions    Time 3    Period Weeks    Status On-going      SLP SHORT TERM GOAL #3   Title Pt will produce 3 minutes simple conversation with WNL volume over three sessions    Time 3    Period  Weeks    Status On-going      SLP SHORT TERM GOAL #4   Title pt will track appointment schedule and/or to-do lists via external cues during 4 sessions    Time 3    Period Weeks    Status On-going            SLP Long Term Goals - 06/09/20 2256      SLP LONG TERM GOAL #1   Title Pt will produce 10 minutes simple-mod complex conversation with WNL volume over three sessions    Time 7    Period Weeks   or 17 total sessions, for all LTGs   Status On-going      SLP LONG TERM GOAL #2   Title pt will produce loud /a/ or "hey!" with upper 80s dB average over three sessions    Time 7    Period Weeks    Status On-going      SLP LONG TERM GOAL #3   Title Pt will tell SLP 3 overt s/sx aspiration in 2 sessions with modified independence    Time 7    Period Weeks    Status On-going            Plan - 06/09/20 2253    Clinical Impression Statement Joshua Browning demonstrated an average loudness of upper 60's and hoarse voice when he entered ST room today. Today pt acknowledged he realized he needs to feel like he is shouting in order to speak in WNL volume. Joshua Browning communicated that loud intelligible speech is also important at his job, when he is on the radio and face to face communication. SLP believes pt would benefit from skilled ST targeting improved speech loudness.    Speech Therapy Frequency 2x / week    Duration 8 weeks   8 weeks or !7 visits   Treatment/Interventions Functional tasks;Compensatory techniques;SLP instruction and feedback;Compensatory strategies;Internal/external aids;Patient/family education;Diet toleration management by SLP;Trials of upgraded texture/liquids;Cueing hierarchy;Cognitive reorganization;Pharyngeal strengthening exercises;Aspiration precaution training;Environmental controls    Potential to Achieve Goals Good           Patient will benefit from skilled therapeutic intervention in order to improve the following deficits and impairments:   Dysarthria and  anarthria  Cognitive communication deficit    Problem List Patient Active Problem List   Diagnosis Date Noted  . BPH (benign prostatic hypertrophy) with urinary retention 09/23/2013    [facility] ,MS, CCC-SLP  06/09/2020, 10:56 PM  [facility] [address] [place], [place], [address] Phone: [phone]   Fax:  [phone]  Name: Joshua Browning MRN: [id number] Date of Birth: [birth year]

## 2020-06-12 ENCOUNTER — Other Ambulatory Visit: Payer: Self-pay

## 2020-06-12 ENCOUNTER — Ambulatory Visit: Payer: Medicare Other

## 2020-06-12 DIAGNOSIS — R41841 Cognitive communication deficit: Secondary | ICD-10-CM

## 2020-06-12 DIAGNOSIS — R278 Other lack of coordination: Secondary | ICD-10-CM | POA: Diagnosis not present

## 2020-06-12 DIAGNOSIS — R471 Dysarthria and anarthria: Secondary | ICD-10-CM

## 2020-06-12 NOTE — Patient Instructions (Signed)
   Loud "HEY" 5x, twice a day.  Say your "Everyday Sentences"  1) Is dinner ready?  2) Have a good day. I'm leaving for work.  3) Good morning sweetheart. How did you sleep?  4)  5)  6)  7)  8)  9)  10)

## 2020-06-12 NOTE — Therapy (Signed)
[facility] [address] [place], [place], [address] Phone: [phone]   Fax:  [phone]  Speech Language Pathology Treatment  Patient Details  Name: WAYLEN DEPAOLO MRN: [id number] Date of Birth: 1945/09/01 Referring Provider (SLP): Alonza Bogus, DO   Encounter Date: 06/12/2020   End of Session - 06/12/20 1611    Visit Number 4    Number of Visits 17    Date for SLP Re-Evaluation 08/28/20    SLP Start Time 1450    SLP Stop Time  1530    SLP Time Calculation (min) 40 min    Activity Tolerance Patient tolerated treatment well           Past Medical History:  Diagnosis Date  . BPH (benign prostatic hyperplasia)   . Foley catheter in place    removed after turp  . GERD (gastroesophageal reflux disease)   . Nocturnal leg cramps    occ  . Urinary retention   . Wears glasses     Past Surgical History:  Procedure Laterality Date  . CATARACT EXTRACTION W/ INTRAOCULAR LENS  IMPLANT, BILATERAL Bilateral   . INGUINAL HERNIA REPAIR Bilateral RIGHT x 2 9381 umbilical done with last right   left last one done 10 yrs ago  . RE-DO RIGHT INGUINAL HERNIA REPAIR AND UMBILICAL HERNIA REPAIR  2001  . REMOVAL LEFT EYE INTRAOCULAR LENS AND REPLACEMENT   08-29-2013  . TRANSURETHRAL RESECTION OF PROSTATE N/A 09/23/2013   Procedure: TRANSURETHRAL RESECTION OF THE PROSTATE (TURP) WITH GYRUS;  Surgeon: Claybon Jabs, MD;  Location: [facility];  Service: Urology;  Laterality: N/A;  . VARICOCELECTOMY Left 07/02/2018   Procedure: VARICOCELECTOMY;  Surgeon: Kathie Rhodes, MD;  Location: [facility];  Service: Urology;  Laterality: Left;    There were no vitals filed for this visit.   Subjective Assessment - 06/12/20 1502    Subjective Pt entered with conversational speech below WNL.                 ADULT SLP TREATMENT - 06/12/20 1524      General Information   Behavior/Cognition  Alert;Cooperative;Pleasant mood      Cognitive-Linquistic Treatment   Treatment focused on Dysarthria;Patient/family/caregiver education    Skilled Treatment Pt enters room with sub-WNL volume, in mid-upper 60s db; With consistent modeling by SLP pt achieved low-mid 80s dB average with "hey" to improve breath support/abdominal recruitment for conversational volume. SLP attempted to progress to "hey-ah" and "hey- ahhhhhh" ubt pt with very little success with consistent loudness with anything other than "hey" today. SLP introduced pt to everyday sentences to practice with his "hey" but pt was reticent about generating these sentences. With mod encouragement from SLP pt thought of 3. Homework to generate the remainder. SLP explained to pt why his volume trailed off in conversation after loud /a/.       Assessment / Recommendations / Plan   Plan Continue with current plan of care      Progression Toward Goals   Progression toward goals Progressing toward goals            SLP Education - 06/12/20 1607    Education Details everday sentences explanation and rationale    Person(s) Educated Patient    Methods Explanation    Comprehension Verbalized understanding            SLP Short Term Goals - 06/12/20 1612      SLP SHORT TERM GOAL #1  Title pt will answer questions about his swallowing safety at mealtimes    Time 1    Period Weeks    Status On-going      SLP SHORT TERM GOAL #2   Title pt will produce loud /a/ or "hey!" with at least mid-upper 80s dB average over three sessions    Time 3    Period Weeks    Status On-going      SLP SHORT TERM GOAL #3   Title Pt will produce 3 minutes simple conversation with WNL volume over three sessions    Time 3    Period Weeks    Status On-going      SLP SHORT TERM GOAL #4   Title pt will track appointment schedule and/or to-do lists via external cues during 4 sessions    Time 3    Period Weeks    Status On-going            SLP  Long Term Goals - 06/12/20 1613      SLP LONG TERM GOAL #1   Title Pt will produce 10 minutes simple-mod complex conversation with WNL volume over three sessions    Time 7    Period Weeks   or 17 total sessions, for all LTGs   Status On-going      SLP LONG TERM GOAL #2   Title pt will produce loud /a/ or "hey!" with upper 80s dB average over three sessions    Time 7    Period Weeks    Status On-going      SLP LONG TERM GOAL #3   Title Pt will tell SLP 3 overt s/sx aspiration in 2 sessions with modified independence    Time 7    Period Weeks    Status On-going            Plan - 06/12/20 1612    Clinical Impression Statement Mr. Bayona demonstrated an average loudness of upper 60's and hoarse voice when he entered ST room today. See "skilled intervention" for more details about today's session. Madix communicated that loud intelligible speech is also important at his job, when he is on the radio and face to face communication. SLP believes pt would benefit from skilled ST targeting improved speech loudness.    Speech Therapy Frequency 2x / week    Duration 8 weeks   8 weeks or !7 visits   Treatment/Interventions Functional tasks;Compensatory techniques;SLP instruction and feedback;Compensatory strategies;Internal/external aids;Patient/family education;Diet toleration management by SLP;Trials of upgraded texture/liquids;Cueing hierarchy;Cognitive reorganization;Pharyngeal strengthening exercises;Aspiration precaution training;Environmental controls    Potential to Achieve Goals Good           Patient will benefit from skilled therapeutic intervention in order to improve the following deficits and impairments:   Dysarthria and anarthria  Cognitive communication deficit    Problem List Patient Active Problem List   Diagnosis Date Noted  . BPH (benign prostatic hypertrophy) with urinary retention 09/23/2013    [facility] ,MS, CCC-SLP  06/12/2020, 4:13 PM  [facility] [address] [place], [place], [address] Phone: [phone]   Fax:  [phone]   Name: ZAEDEN LASTINGER MRN: [id number] Date of Birth: June 27, 1946

## 2020-06-16 ENCOUNTER — Ambulatory Visit: Payer: Medicare Other | Attending: Neurology | Admitting: Occupational Therapy

## 2020-06-16 ENCOUNTER — Ambulatory Visit: Payer: Medicare Other

## 2020-06-16 ENCOUNTER — Encounter: Payer: Self-pay | Admitting: Occupational Therapy

## 2020-06-16 ENCOUNTER — Other Ambulatory Visit: Payer: Self-pay

## 2020-06-16 DIAGNOSIS — R29818 Other symptoms and signs involving the nervous system: Secondary | ICD-10-CM | POA: Insufficient documentation

## 2020-06-16 DIAGNOSIS — R2689 Other abnormalities of gait and mobility: Secondary | ICD-10-CM | POA: Diagnosis present

## 2020-06-16 DIAGNOSIS — R41841 Cognitive communication deficit: Secondary | ICD-10-CM | POA: Diagnosis present

## 2020-06-16 DIAGNOSIS — M25621 Stiffness of right elbow, not elsewhere classified: Secondary | ICD-10-CM | POA: Diagnosis present

## 2020-06-16 DIAGNOSIS — M25622 Stiffness of left elbow, not elsewhere classified: Secondary | ICD-10-CM | POA: Diagnosis present

## 2020-06-16 DIAGNOSIS — R2681 Unsteadiness on feet: Secondary | ICD-10-CM | POA: Diagnosis present

## 2020-06-16 DIAGNOSIS — R278 Other lack of coordination: Secondary | ICD-10-CM | POA: Diagnosis not present

## 2020-06-16 DIAGNOSIS — R293 Abnormal posture: Secondary | ICD-10-CM | POA: Insufficient documentation

## 2020-06-16 DIAGNOSIS — R471 Dysarthria and anarthria: Secondary | ICD-10-CM | POA: Insufficient documentation

## 2020-06-16 NOTE — Therapy (Signed)
[facility] [address] [place], [place], [address] Phone: [phone]   Fax:  [phone]  Occupational Therapy Treatment  Patient Details  Name: Joshua Browning MRN: [id number] Date of Birth: [birth year] Referring Provider (OT): Dr. Carles Collet   Encounter Date: 06/16/2020   OT End of Session - 06/16/20 1328    Visit Number 2    Number of Visits 17    Date for OT Re-Evaluation 07/28/20    Authorization Type [employer]    Authorization Time Period 12 weeks (pt is only scheduled for 6 weeks)    Authorization - Visit Number 2    Authorization - Number of Visits 10    OT Start Time 1320    OT Stop Time 1400    OT Time Calculation (min) 40 min    Activity Tolerance Patient tolerated treatment well    Behavior During Therapy [facility] for tasks assessed/performed           Past Medical History:  Diagnosis Date  . BPH (benign prostatic hyperplasia)   . Foley catheter in place    removed after turp  . GERD (gastroesophageal reflux disease)   . Nocturnal leg cramps    occ  . Urinary retention   . Wears glasses     Past Surgical History:  Procedure Laterality Date  . CATARACT EXTRACTION W/ INTRAOCULAR LENS  IMPLANT, BILATERAL Bilateral   . INGUINAL HERNIA REPAIR Bilateral RIGHT x 2 9924 umbilical done with last right   left last one done 10 yrs ago  . RE-DO RIGHT INGUINAL HERNIA REPAIR AND UMBILICAL HERNIA REPAIR  2001  . REMOVAL LEFT EYE INTRAOCULAR LENS AND REPLACEMENT   08-29-2013  . TRANSURETHRAL RESECTION OF PROSTATE N/A 09/23/2013   Procedure: TRANSURETHRAL RESECTION OF THE PROSTATE (TURP) WITH GYRUS;  Surgeon: Claybon Jabs, MD;  Location: [facility];  Service: Urology;  Laterality: N/A;  . VARICOCELECTOMY Left 07/02/2018   Procedure: VARICOCELECTOMY;  Surgeon: Kathie Rhodes, MD;  Location: [facility];  Service: Urology;  Laterality: Left;    There were no vitals filed for this  visit.   Subjective Assessment - 06/16/20 1500    Subjective  Pt denies pain    Patient Stated Goals to fasten buttons and write better    Currently in Pain? No/denies                  Treatment: PWR! Seated basic 4, 10 reps each, min v.c and demonstration.              OT Education - 06/16/20 1401    Education Details coordination HEP, handwriting strategies and handout with practice, min v.c- improved legibility    Person(s) Educated Patient    Methods Explanation;Demonstration;Verbal cues;Handout    Comprehension Verbalized understanding;Returned demonstration;Verbal cues required            OT Short Term Goals - 06/02/20 1524      OT SHORT TERM GOAL #1   Title Pt will verbalize understanding of adapted strategies to maximize safety and I with ADLs/ IADLs .    Time 4    Period Weeks    Status New      OT SHORT TERM GOAL #2   Title I with PD specific HEP    Time 4    Period Weeks    Status New      OT SHORT TERM GOAL #3   Title Pt will demonstrate improved fine motor  coordination for ADLs as evidenced by decreasing 9 hole peg test score for LUE by 3 secs    Time 4    Period Weeks    Status New      OT SHORT TERM GOAL #4   Title Pt will demonstrate increased ease with dressing as eveidnced by decreasing PPT#4(don/ doff jacket) to 17 secs or less    Time 4    Period Weeks    Status New      OT SHORT TERM GOAL #5   Title Pt will demonstrate improved ease with dressing as eveidenced by decreasing 3 button/ unbutton time to 35 secs or less    Time 4    Period Weeks    Status New             OT Long Term Goals - 06/02/20 1526      OT LONG TERM GOAL #1   Title Pt will demonstrate understanding of memory compensations and ways to keep thinking skills sharp    Time 8    Period Weeks    Status New      OT LONG TERM GOAL #2   Title Pt will write a short paragraph with 100% legibility and no significant decrease in letter size    Time 8      Period Weeks    Status New      OT LONG TERM GOAL #3   Title Pt will verbalize understanding of ways to prevent future PD related complications and PD community resources.    Time 8    Period Weeks    Status New      OT LONG TERM GOAL #4   Title Pt will demonstrate ability to retrieve a lightweight object from overhead shelf with RUE with elbow extension -10 or less.    Time 8    Period Weeks    Status New                 Plan - 06/16/20 1329    Clinical Impression Statement Pt is progressing towards goals. He demonstrates understanding of initial HEP.    OT Occupational Profile and History Detailed Assessment- Review of Records and additional review of physical, cognitive, psychosocial history related to current functional performance    Occupational performance deficits (Please refer to evaluation for details): ADL's;IADL's;Work;Play;Leisure;Social Participation    Body Structure / Function / Physical Skills ADL;UE functional use;Flexibility;FMC;ROM;GMC;Coordination;IADL;Dexterity;Decreased knowledge of use of DME;Strength;Mobility;Endurance;Balance;Gait    Cognitive Skills Memory    Rehab Potential Good    Clinical Decision Making Limited treatment options, no task modification necessary    Comorbidities Affecting Occupational Performance: May have comorbidities impacting occupational performance    Modification or Assistance to Complete Evaluation  No modification of tasks or assist necessary to complete eval    OT Frequency 2x / week   plus eval, may d/c after 6 weeks   OT Duration 8 weeks    OT Treatment/Interventions Self-care/ADL training;Ultrasound;DME and/or AE instruction;Patient/family education;Balance training;Passive range of motion;Gait Training;Cryotherapy;Fluidtherapy;Functional Mobility Training;Moist Heat;Therapeutic exercise;Manual Therapy;Cognitive remediation/compensation;Neuromuscular education    Plan reveiw and issue PWR! seated, check on how  coordination HEP is going, ADL strategies    Consulted and Agree with Plan of Care Patient           Patient will benefit from skilled therapeutic intervention in order to improve the following deficits and impairments:   Body Structure / Function / Physical Skills: ADL, UE functional use, Flexibility, FMC, ROM, GMC, Coordination, IADL,  Dexterity, Decreased knowledge of use of DME, Strength, Mobility, Endurance, Balance, Gait Cognitive Skills: Memory     Visit Diagnosis: Other lack of coordination  Other symptoms and signs involving the nervous system  Abnormal posture    Problem List Patient Active Problem List   Diagnosis Date Noted  . BPH (benign prostatic hypertrophy) with urinary retention 09/23/2013    [facility] [place] 06/16/2020, 3:01 PM Theone Murdoch, OTR/L [phone] Phone: [phone] 3:01 PM 11/02/21[facility] [address] [place], [place], [address] Phone: [phone]   Fax:  [phone]  Name: SHAFER SWAMY MRN: [id number] Date of Birth: 07-15-1946

## 2020-06-16 NOTE — Patient Instructions (Signed)
Practice 20 minutes/day on louder speech with the tasks provided.

## 2020-06-16 NOTE — Patient Instructions (Signed)

## 2020-06-16 NOTE — Therapy (Signed)
[facility] [address] [place], [place], [address] Phone: [phone]   Fax:  [phone]  Speech Language Pathology Treatment  Patient Details  Name: Joshua Browning MRN: [id number] Date of Birth: Oct 10, 1945 Referring Provider (SLP): Alonza Bogus, DO   Encounter Date: 06/16/2020   End of Session - 06/16/20 1442    Visit Number 5    Number of Visits 17    Date for SLP Re-Evaluation 08/28/20    SLP Start Time 8676    SLP Stop Time  1444    SLP Time Calculation (min) 41 min    Activity Tolerance Patient tolerated treatment well           Past Medical History:  Diagnosis Date  . BPH (benign prostatic hyperplasia)   . Foley catheter in place    removed after turp  . GERD (gastroesophageal reflux disease)   . Nocturnal leg cramps    occ  . Urinary retention   . Wears glasses     Past Surgical History:  Procedure Laterality Date  . CATARACT EXTRACTION W/ INTRAOCULAR LENS  IMPLANT, BILATERAL Bilateral   . INGUINAL HERNIA REPAIR Bilateral RIGHT x 2 1950 umbilical done with last right   left last one done 10 yrs ago  . RE-DO RIGHT INGUINAL HERNIA REPAIR AND UMBILICAL HERNIA REPAIR  2001  . REMOVAL LEFT EYE INTRAOCULAR LENS AND REPLACEMENT   08-29-2013  . TRANSURETHRAL RESECTION OF PROSTATE N/A 09/23/2013   Procedure: TRANSURETHRAL RESECTION OF THE PROSTATE (TURP) WITH GYRUS;  Surgeon: Claybon Jabs, MD;  Location: [facility];  Service: Urology;  Laterality: N/A;  . VARICOCELECTOMY Left 07/02/2018   Procedure: VARICOCELECTOMY;  Surgeon: Kathie Rhodes, MD;  Location: [facility];  Service: Urology;  Laterality: Left;    There were no vitals filed for this visit.   Subjective Assessment - 06/16/20 1409    Subjective "A bigger proble I have is balance - I used to stumble in and out of cars."    Currently in Pain? No/denies                 ADULT SLP TREATMENT - 06/16/20  1411      General Information   Behavior/Cognition Alert;Cooperative;Pleasant mood      Cognitive-Linquistic Treatment   Treatment focused on Dysarthria;Patient/family/caregiver education    Skilled Treatment Denies he is having swallowing difficulties/overt s/sx aspiration with meals. "I did (the loud heys) after I got home and before she got home." Average low 80s dB. SLP had pt use tactile cue of hand on abdominal muscles to track if abs are recruited. Pt reported he could feel them. SLP told pt to hold out "heyyyyyyyy" and SLP counted three seconds. Pt req'd initial cues for loudness and holding out the production - average low 90s dB. Pt with little transfer to conversational speech. Pt stated he keep up wiht appointmetns via his phone and a paper calendar on the refrig.      Assessment / Recommendations / Plan   Plan Continue with current plan of care      Progression Toward Goals   Progression toward goals Progressing toward goals              SLP Short Term Goals - 06/16/20 1443      SLP SHORT TERM GOAL #1   Title pt will answer questions about his swallowing safety at mealtimes    Status Achieved      SLP  SHORT TERM GOAL #2   Title pt will produce loud /a/ or "hey!" with at least mid-upper 80s dB average over three sessions    Baseline 06-16-20    Status Partially Met      SLP SHORT TERM GOAL #3   Title Pt will produce 3 minutes simple conversation with WNL volume over three sessions    Status Not Met      SLP SHORT TERM GOAL #4   Title pt will track appointment schedule and/or to-do lists via external cues during 4 sessions    Baseline 06-16-20    Time 2    Period Weeks    Status On-going            SLP Long Term Goals - 06/16/20 1447      SLP LONG TERM GOAL #1   Title Pt will produce 10 minutes simple-mod complex conversation with WNL volume over three sessions    Time 6    Period Weeks   or 17 total sessions, for all LTGs   Status On-going      SLP  LONG TERM GOAL #2   Title pt will produce loud /a/ or "hey!" with upper 80s dB average over three sessions    Time 6    Period Weeks    Status On-going      SLP LONG TERM GOAL #3   Title Pt will tell SLP 3 overt s/sx aspiration in 2 sessions with modified independence    Time 6    Period Weeks    Status On-going            Plan - 06/16/20 1442    Clinical Impression Statement Mr. Clouatre demonstrated an average loudness of upper 60's and hoarse voice when he entered ST room today. See "skilled intervention" for more details about today's session. Sirus communicated that loud intelligible speech is also important at his job, when he is on the radio and face to face communication. SLP believes pt would benefit from skilled ST targeting improved speech loudness.    Speech Therapy Frequency 2x / week    Duration 8 weeks   8 weeks or !7 visits   Treatment/Interventions Functional tasks;Compensatory techniques;SLP instruction and feedback;Compensatory strategies;Internal/external aids;Patient/family education;Diet toleration management by SLP;Trials of upgraded texture/liquids;Cueing hierarchy;Cognitive reorganization;Pharyngeal strengthening exercises;Aspiration precaution training;Environmental controls    Potential to Achieve Goals Good           Patient will benefit from skilled therapeutic intervention in order to improve the following deficits and impairments:   Dysarthria and anarthria  Cognitive communication deficit    Problem List Patient Active Problem List   Diagnosis Date Noted  . BPH (benign prostatic hypertrophy) with urinary retention 09/23/2013    [facility] ,[place], CCC-SLP  06/16/2020, 2:48 PM  [facility] [name] [place], [place], [address] Phone: [phone]   Fax:  [phone]   Name: Joshua Browning MRN: [id number] Date of Birth: 1945/08/16

## 2020-06-19 ENCOUNTER — Ambulatory Visit: Payer: Medicare Other

## 2020-06-19 ENCOUNTER — Ambulatory Visit: Payer: Medicare Other | Admitting: Occupational Therapy

## 2020-06-19 ENCOUNTER — Encounter: Payer: Self-pay | Admitting: Occupational Therapy

## 2020-06-19 ENCOUNTER — Other Ambulatory Visit: Payer: Self-pay

## 2020-06-19 DIAGNOSIS — R29818 Other symptoms and signs involving the nervous system: Secondary | ICD-10-CM

## 2020-06-19 DIAGNOSIS — R471 Dysarthria and anarthria: Secondary | ICD-10-CM

## 2020-06-19 DIAGNOSIS — R2681 Unsteadiness on feet: Secondary | ICD-10-CM

## 2020-06-19 DIAGNOSIS — R278 Other lack of coordination: Secondary | ICD-10-CM

## 2020-06-19 DIAGNOSIS — M25622 Stiffness of left elbow, not elsewhere classified: Secondary | ICD-10-CM

## 2020-06-19 DIAGNOSIS — M25621 Stiffness of right elbow, not elsewhere classified: Secondary | ICD-10-CM

## 2020-06-19 DIAGNOSIS — R293 Abnormal posture: Secondary | ICD-10-CM

## 2020-06-19 DIAGNOSIS — R41841 Cognitive communication deficit: Secondary | ICD-10-CM

## 2020-06-19 NOTE — Therapy (Addendum)
[facility] [address] [place] [employer], [place], [address] Phone: [phone]   Fax:  [phone]  Occupational Therapy Treatment  Patient Details  Name: Joshua Browning MRN: [id number] Date of Birth: 13-Apr-1946 Referring Provider (OT): Dr. Carles Collet   Encounter Date: 06/19/2020   OT End of Session - 06/19/20 1230    Visit Number 3    Number of Visits 17    Date for OT Re-Evaluation 07/28/20    Authorization Type [employer]    Authorization Time Period 12 weeks (pt is only scheduled for 6 weeks)    Authorization - Visit Number 3    Authorization - Number of Visits 10    OT Start Time 3382    OT Stop Time 1310    OT Time Calculation (min) 39 min    Activity Tolerance Patient tolerated treatment well    Behavior During Therapy [facility] for tasks assessed/performed           Past Medical History:  Diagnosis Date  . BPH (benign prostatic hyperplasia)   . Foley catheter in place    removed after turp  . GERD (gastroesophageal reflux disease)   . Nocturnal leg cramps    occ  . Urinary retention   . Wears glasses     Past Surgical History:  Procedure Laterality Date  . CATARACT EXTRACTION W/ INTRAOCULAR LENS  IMPLANT, BILATERAL Bilateral   . INGUINAL HERNIA REPAIR Bilateral RIGHT x 2 5053 umbilical done with last right   left last one done 10 yrs ago  . RE-DO RIGHT INGUINAL HERNIA REPAIR AND UMBILICAL HERNIA REPAIR  2001  . REMOVAL LEFT EYE INTRAOCULAR LENS AND REPLACEMENT   08-29-2013  . TRANSURETHRAL RESECTION OF PROSTATE N/A 09/23/2013   Procedure: TRANSURETHRAL RESECTION OF THE PROSTATE (TURP) WITH GYRUS;  Surgeon: Claybon Jabs, MD;  Location: [facility];  Service: Urology;  Laterality: N/A;  . VARICOCELECTOMY Left 07/02/2018   Procedure: VARICOCELECTOMY;  Surgeon: Kathie Rhodes, MD;  Location: [facility];  Service: Urology;  Laterality: Left;    There were no vitals filed for this  visit.   Subjective Assessment - 06/19/20 1230    Subjective  Pt denies pain    Patient Stated Goals to fasten buttons and write better    Currently in Pain? No/denies                  Treatment:PWR! Hands basic 4, 10 reps each, min v.c/ demonstration Handwriting exercise with continuous "l" than writing sentences with larger foam grip. Pt with improved handwriting with practice. PWR! Rock standing, then [employer]! Up and PWR! Rock modified quadraped at sink/ counter 10 reps each min v.c /facilitation Dynamic step and reach to flip large playing card with left and right UE's min v.c for larger amplitude movement Discussed sit to stand strategies, fastening buttons with adaptive strategy and use of big movements with donning/ doffing jacket                OT Short Term Goals - 06/02/20 1524      OT SHORT TERM GOAL #1   Title Pt will verbalize understanding of adapted strategies to maximize safety and I with ADLs/ IADLs .    Time 4    Period Weeks    Status New      OT SHORT TERM GOAL #2   Title I with PD specific HEP    Time 4    Period Weeks  Status New      OT SHORT TERM GOAL #3   Title Pt will demonstrate improved fine motor coordination for ADLs as evidenced by decreasing 9 hole peg test score for LUE by 3 secs    Time 4    Period Weeks    Status New      OT SHORT TERM GOAL #4   Title Pt will demonstrate increased ease with dressing as eveidnced by decreasing PPT#4(don/ doff jacket) to 17 secs or less    Time 4    Period Weeks    Status New      OT SHORT TERM GOAL #5   Title Pt will demonstrate improved ease with dressing as eveidenced by decreasing 3 button/ unbutton time to 35 secs or less    Time 4    Period Weeks    Status New             OT Long Term Goals - 06/19/20 1246      OT LONG TERM GOAL #1   Title Pt will demonstrate understanding of memory compensations and ways to keep thinking skills sharp    Time 8    Period Weeks     Status New      OT LONG TERM GOAL #2   Title Pt will write a short paragraph with 100% legibility and no significant decrease in letter size    Time 8    Period Weeks    Status New      OT LONG TERM GOAL #3   Title Pt will verbalize understanding of ways to prevent future PD related complications and PD community resources.    Time 8    Period Weeks    Status New      OT LONG TERM GOAL #4   Title Pt will demonstrate ability to retrieve a lightweight object from overhead shelf with RUE with elbow extension -10 or less.    Time 8    Period Weeks    Status New                  Patient will benefit from skilled therapeutic intervention in order to improve the following deficits and impairments:  Decreased coordination, decreased functional mobility, decreased ROM, bradykinesia             Plan -    Clinical Impression Statement Pt is progressing towards goals. He responds  well to v.c. for larger amplitude movements with functional activity.    OT Occupational Profile and History Detailed Assessment- Review of Records and additional review of physical, cognitive, psychosocial history related to current functional performance    Occupational performance deficits (Please refer to evaluation for details): ADL's;IADL's;Work;Play;Leisure;Social Participation    Body Structure / Function / Physical Skills ADL;UE functional use;Flexibility;FMC;ROM;GMC;Coordination;IADL;Dexterity;Decreased knowledge of use of DME;Strength;Mobility;Endurance;Balance;Gait    Cognitive Skills Memory    Rehab Potential Good    Clinical Decision Making Limited treatment options, no task modification necessary    Comorbidities Affecting Occupational Performance: May have comorbidities impacting occupational performance    Modification or Assistance to Complete Evaluation  No modification of tasks or assist necessary to complete eval    OT Frequency 2x / week   plus eval, may d/c after 6 weeks   OT  Duration 8 weeks    OT Treatment/Interventions Self-care/ADL training;Ultrasound;DME and/or AE instruction;Patient/family education;Balance training;Passive range of motion;Gait Training;Cryotherapy;Fluidtherapy;Functional Mobility Training;Moist Heat;Therapeutic exercise;Manual Therapy;Cognitive remediation/compensation;Neuromuscular education    Plan continue to work towards goals, ADL strategies  Consulted and Agree with Plan of Care Patient          Visit Diagnosis: Other lack of coordination  Other symptoms and signs involving the nervous system  Abnormal posture  Unsteadiness on feet  Stiffness of left elbow, not elsewhere classified  Stiffness of right elbow, not elsewhere classified  Problem List Patient Active Problem List   Diagnosis Date Noted  . BPH (benign prostatic hypertrophy) with urinary retention 09/23/2013    Junior Kenedy 06/19/2020, 1:27 PM  [facility] [address] [place], [place], [address] Phone: [phone]   Fax:  [phone]  Name: Joshua Browning MRN: [id number] Date of Birth: [birth year]

## 2020-06-20 NOTE — Therapy (Signed)
[facility] [address] [place], [place], [address] Phone: [phone]   Fax:  [phone]  Speech Language Pathology Treatment  Patient Details  Name: Joshua Browning MRN: [id number] Date of Birth: 1946-01-10 Referring Provider (SLP): Joshua Bogus, DO   Encounter Date: 06/19/2020   End of Session - 06/20/20 1345    Visit Number 6    Number of Visits 17    Date for SLP Re-Evaluation 08/28/20    SLP Start Time 1319    SLP Stop Time  1400    SLP Time Calculation (min) 41 min    Activity Tolerance Patient tolerated treatment well           Past Medical History:  Diagnosis Date  . BPH (benign prostatic hyperplasia)   . Foley catheter in place    removed after turp  . GERD (gastroesophageal reflux disease)   . Nocturnal leg cramps    occ  . Urinary retention   . Wears glasses     Past Surgical History:  Procedure Laterality Date  . CATARACT EXTRACTION W/ INTRAOCULAR LENS  IMPLANT, BILATERAL Bilateral   . INGUINAL HERNIA REPAIR Bilateral RIGHT x 2 1062 umbilical done with last right   left last one done 10 yrs ago  . RE-DO RIGHT INGUINAL HERNIA REPAIR AND UMBILICAL HERNIA REPAIR  2001  . REMOVAL LEFT EYE INTRAOCULAR LENS AND REPLACEMENT   08-29-2013  . TRANSURETHRAL RESECTION OF PROSTATE N/A 09/23/2013   Procedure: TRANSURETHRAL RESECTION OF THE PROSTATE (TURP) WITH GYRUS;  Surgeon: Joshua Jabs, MD;  Location: [facility];  Service: Urology;  Laterality: N/A;  . VARICOCELECTOMY Left 07/02/2018   Procedure: VARICOCELECTOMY;  Surgeon: Joshua Rhodes, MD;  Location: [facility];  Service: Urology;  Laterality: Left;    There were no vitals filed for this visit.   Subjective Assessment - 06/19/20 1346    Subjective "I came right from work." (mid-upper 60s dB)    Currently in Pain? No/denies                 ADULT SLP TREATMENT - 06/19/20 1347      General Information    Behavior/Cognition Alert;Cooperative;Pleasant mood      Cognitive-Linquistic Treatment   Treatment focused on Dysarthria;Patient/family/caregiver education    Skilled Treatment Pt reports sub optimal copmletion of "hey" at home.  Average low-mid 80s dB. Pt read everyday sentences and req'd consistent mod-max cues for loudness initialy, faded to min cues rarely after 4 reps of his list. SLP shared pt's sentences are not all everyday, so pt try to change one or two. Pt read paragraphs and Q and A out loud with louder speech with average low 70s-upper 60s dB. Pt with minimal transfer to conversational speech but more than last session.      Assessment / Recommendations / Plan   Plan Continue with current plan of care      Progression Toward Goals   Progression toward goals Progressing toward goals              SLP Short Term Goals - 06/20/20 1346      SLP SHORT TERM GOAL #1   Title pt will answer questions about his swallowing safety at mealtimes    Status Achieved      SLP SHORT TERM GOAL #2   Title pt will produce loud /a/ or "hey!" with at least mid-upper 80s dB average over three sessions    Baseline 06-16-20  Status Partially Met      SLP SHORT TERM GOAL #3   Title Pt will produce 3 minutes simple conversation with WNL volume over three sessions    Status Not Met      SLP SHORT TERM GOAL #4   Title pt will track appointment schedule and/or to-do lists via external cues during 4 sessions    Baseline 06-16-20    Time 2    Period Weeks    Status On-going            SLP Long Term Goals - 06/20/20 1347      SLP LONG TERM GOAL #1   Title Pt will produce 10 minutes simple-mod complex conversation with WNL volume over three sessions    Time 6    Period Weeks   or 17 total sessions, for all LTGs   Status On-going      SLP LONG TERM GOAL #2   Title pt will produce loud /a/ or "hey!" with upper 80s dB average over three sessions    Time 6    Period Weeks    Status  On-going      SLP LONG TERM GOAL #3   Title Pt will tell SLP 3 overt s/sx aspiration in 2 sessions with modified independence    Time 6    Period Weeks    Status On-going            Plan - 06/20/20 1345    Clinical Impression Statement [name] continued to demo average loudness of upper 60's and hoarse voice when he entered ST room today. See "skilled intervention" for more details about today's session.Pt cont to endorse that loud intelligible speech is important at his job, when he is on the radio and face to face communication. SLP believes pt would benefit from skilled ST targeting improved speech loudness.    Speech Therapy Frequency 2x / week    Duration 8 weeks   8 weeks or !7 visits   Treatment/Interventions Functional tasks;Compensatory techniques;SLP instruction and feedback;Compensatory strategies;Internal/external aids;Patient/family education;Diet toleration management by SLP;Trials of upgraded texture/liquids;Cueing hierarchy;Cognitive reorganization;Pharyngeal strengthening exercises;Aspiration precaution training;Environmental controls    Potential to Achieve Goals Good           Patient will benefit from skilled therapeutic intervention in order to improve the following deficits and impairments:   Dysarthria and anarthria  Cognitive communication deficit    Problem List Patient Active Problem List   Diagnosis Date Noted  . BPH (benign prostatic hypertrophy) with urinary retention 09/23/2013    [facility] ,MS, CCC-SLP  06/20/2020, 1:47 PM  [facility] [address] [place], [place], [address] Phone: [phone]   Fax:  [phone]   Name: Joshua Browning MRN: [id number] Date of Birth: February 12, 1946

## 2020-06-23 ENCOUNTER — Encounter: Payer: Self-pay | Admitting: Occupational Therapy

## 2020-06-23 ENCOUNTER — Other Ambulatory Visit: Payer: Self-pay

## 2020-06-23 ENCOUNTER — Ambulatory Visit: Payer: Medicare Other | Admitting: Occupational Therapy

## 2020-06-23 ENCOUNTER — Ambulatory Visit: Payer: Medicare Other

## 2020-06-23 DIAGNOSIS — R278 Other lack of coordination: Secondary | ICD-10-CM | POA: Diagnosis not present

## 2020-06-23 DIAGNOSIS — M25621 Stiffness of right elbow, not elsewhere classified: Secondary | ICD-10-CM

## 2020-06-23 DIAGNOSIS — R2681 Unsteadiness on feet: Secondary | ICD-10-CM

## 2020-06-23 DIAGNOSIS — R471 Dysarthria and anarthria: Secondary | ICD-10-CM

## 2020-06-23 DIAGNOSIS — R29818 Other symptoms and signs involving the nervous system: Secondary | ICD-10-CM

## 2020-06-23 DIAGNOSIS — M25622 Stiffness of left elbow, not elsewhere classified: Secondary | ICD-10-CM

## 2020-06-23 DIAGNOSIS — R41841 Cognitive communication deficit: Secondary | ICD-10-CM

## 2020-06-23 DIAGNOSIS — R293 Abnormal posture: Secondary | ICD-10-CM

## 2020-06-23 DIAGNOSIS — R2689 Other abnormalities of gait and mobility: Secondary | ICD-10-CM

## 2020-06-23 NOTE — Therapy (Signed)
[facility] [address] [place], [place], [address] Phone: [phone]   Fax:  [phone]  Speech Language Pathology Treatment  Patient Details  Name: Joshua Browning MRN: [id number] Date of Birth: 1946-06-17 Referring Provider (SLP): Joshua Bogus, DO   Encounter Date: 06/23/2020   End of Session - 06/23/20 2306    Visit Number 7    Number of Visits 17    Date for SLP Re-Evaluation 08/28/20    SLP Start Time 1021    SLP Stop Time  1445    SLP Time Calculation (min) 42 min    Activity Tolerance Patient tolerated treatment well           Past Medical History:  Diagnosis Date   BPH (benign prostatic hyperplasia)    Foley catheter in place    removed after turp   GERD (gastroesophageal reflux disease)    Nocturnal leg cramps    occ   Urinary retention    Wears glasses     Past Surgical History:  Procedure Laterality Date   CATARACT EXTRACTION W/ INTRAOCULAR LENS  IMPLANT, BILATERAL Bilateral    INGUINAL HERNIA REPAIR Bilateral RIGHT x 2 1173 umbilical done with last right   left last one done 10 yrs ago   RE-DO RIGHT INGUINAL HERNIA REPAIR AND UMBILICAL HERNIA REPAIR  2001   REMOVAL LEFT EYE INTRAOCULAR LENS AND REPLACEMENT   08-29-2013   TRANSURETHRAL RESECTION OF PROSTATE N/A 09/23/2013   Procedure: TRANSURETHRAL RESECTION OF THE PROSTATE (TURP) WITH GYRUS;  Surgeon: Joshua Jabs, MD;  Location: [facility];  Service: Urology;  Laterality: N/A;   VARICOCELECTOMY Left 07/02/2018   Procedure: VARICOCELECTOMY;  Surgeon: Joshua Rhodes, MD;  Location: [facility];  Service: Urology;  Laterality: Left;    There were no vitals filed for this visit.   Subjective Assessment - 06/23/20 1412    Subjective "I probably should have talked louder." (pt, re: his talking in OT)    Currently in Pain? No/denies                 ADULT SLP TREATMENT - 06/23/20 1414       General Information   Behavior/Cognition Alert;Cooperative;Pleasant mood      Cognitive-Linquistic Treatment   Treatment focused on Dysarthria;Patient/family/caregiver education    Skilled Treatment Loud "hey" average in low 80s dB, sentences initially with consistent mod-max A faded to occasional min-mod A at mid80s dB. Completed analogies with average low 70s dB with initial min-mod A for loudness. Pt completed "stroies" with sentence responses with low-mid 70s dB. When pt was more unsure of his answer the loudness decr'd to low 70s.. In conversation (simple) pt req'd occasional mod cues for WNL volume.       Assessment / Recommendations / Plan   Plan Continue with current plan of care      Progression Toward Goals   Progression toward goals Progressing toward goals              SLP Short Term Goals - 06/23/20 2309      SLP SHORT TERM GOAL #1   Title pt will answer questions about his swallowing safety at mealtimes    Status Achieved      SLP SHORT TERM GOAL #2   Title pt will produce loud /a/ or "hey!" with at least mid-upper 80s dB average over three sessions    Baseline 06-16-20    Status Partially Met  SLP SHORT TERM GOAL #3   Title Pt will produce 3 minutes simple conversation with WNL volume over three sessions    Status Not Met      SLP SHORT TERM GOAL #4   Title pt will track appointment schedule and/or to-do lists via external cues during 4 sessions    Baseline 06-16-20    Time 1    Period Weeks    Status On-going            SLP Long Term Goals - 06/23/20 2309      SLP LONG TERM GOAL #1   Title Pt will produce 10 minutes simple-mod complex conversation with WNL volume over three sessions    Time 5    Period Weeks   or 17 total sessions, for all LTGs   Status On-going      SLP LONG TERM GOAL #2   Title pt will produce loud /a/ or "hey!" with upper 80s dB average over three sessions    Time 5    Period Weeks    Status On-going      SLP LONG TERM  GOAL #3   Title Pt will tell SLP 3 overt s/sx aspiration in 2 sessions with modified independence    Time 5    Period Weeks    Status On-going            Plan - 06/23/20 2308    Clinical Impression Statement Mr. Stock told SLP he could have/should have talked louder in previous therapies today. See "skilled intervention" for more details about today's session.Pt cont to endorse that loud intelligible speech is important at his job, when he is on the radio and face to face communication. SLP believes pt would benefit from skilled ST targeting improved speech loudness.    Speech Therapy Frequency 2x / week    Duration 8 weeks   8 weeks or !7 visits   Treatment/Interventions Functional tasks;Compensatory techniques;SLP instruction and feedback;Compensatory strategies;Internal/external aids;Patient/family education;Diet toleration management by SLP;Trials of upgraded texture/liquids;Cueing hierarchy;Cognitive reorganization;Pharyngeal strengthening exercises;Aspiration precaution training;Environmental controls    Potential to Achieve Goals Good           Patient will benefit from skilled therapeutic intervention in order to improve the following deficits and impairments:   Dysarthria and anarthria  Cognitive communication deficit    Problem List Patient Active Problem List   Diagnosis Date Noted   BPH (benign prostatic hypertrophy) with urinary retention 09/23/2013    [facility] ,[place], [place]  06/23/2020, 11:10 PM  [facility] [address] [place], [place], [address] Phone: [phone]   Fax:  [phone]   Name: Joshua Browning MRN: [id number] Date of Birth: 02-Sep-1945

## 2020-06-23 NOTE — Patient Instructions (Signed)
   Performing Daily Activities with Big Movements  Pick at least 2 activities a day and perform with BIG, DELIBERATE movements/effort.  Try different activities each day. This can make the activity easier and turn daily activities into exercise to prevent problems in the future!  If you are standing during the activity, make sure to keep feet apart and stand with good/big/PWR! UP posture.  Examples:  Dressing - Push arms in sleeves, twist when putting on jacket, push foot into pants, open hands to pull down shirt/put on socks/pull up pants  Buttoning - Open hands big (PWR! Hands) before fastening each button  Bathing - Wash/dry with long strokes  Brushing your teeth - Big, slow movements  Cutting food - Long deliberate cuts  Eating - Hold utensil in the middle, not the end  Picking up a cup/bottle - Open hand up big and get object all the way in palm  Opening jar/bottle - Move as much as you can with each turn  Putting on seatbelt - Twist when reaching  Hanging up clothes/getting clothes down from closet - Reach with big effort  Putting away groceries/dishes - Reach with big effort  Wiping counter/table - Move in big, long strokes  Stirring while cooking - Exaggerate movement  Cleaning windows - Move in big, long strokes  Sweeping - Move arms in big, long strokes  Vacuuming - Push with big movement  Folding clothes - Exaggerate arm movements  Washing car - Move in big, long strokes  Raking - Move arms in big, long strokes  Changing light bulb - Move as much as you can with each turn  Using a screwdriver - Move as much as you can with each turn  Walking into a store/restaurant - Walk with big steps, swing arms if able  Standing up from a chair/recliner/sofa - Scoot forward, lean forward, and stand with big effort

## 2020-06-23 NOTE — Therapy (Addendum)
[facility] [address] [place], [place], [address] Phone: [phone]   Fax:  [phone]  Occupational Therapy Treatment  Patient Details  Name: Joshua Browning MRN: [id number] Date of Birth: Jun 05, 1946 Referring Provider (OT): Dr. Carles Collet   Encounter Date: 06/23/2020   OT End of Session - 06/23/20 1421    Visit Number 4    Number of Visits 17    Date for OT Re-Evaluation 07/28/20    Authorization Type [employer]    Authorization Time Period 12 weeks (pt is only scheduled for 6 weeks)    Authorization - Visit Number 4    Authorization - Number of Visits 10    OT Start Time 1607    OT Stop Time 1405    OT Time Calculation (min) 42 min    Activity Tolerance Patient tolerated treatment well    Behavior During Therapy [facility] for tasks assessed/performed           Past Medical History:  Diagnosis Date  . BPH (benign prostatic hyperplasia)   . Foley catheter in place    removed after turp  . GERD (gastroesophageal reflux disease)   . Nocturnal leg cramps    occ  . Urinary retention   . Wears glasses     Past Surgical History:  Procedure Laterality Date  . CATARACT EXTRACTION W/ INTRAOCULAR LENS  IMPLANT, BILATERAL Bilateral   . INGUINAL HERNIA REPAIR Bilateral RIGHT x 2 3710 umbilical done with last right   left last one done 10 yrs ago  . RE-DO RIGHT INGUINAL HERNIA REPAIR AND UMBILICAL HERNIA REPAIR  2001  . REMOVAL LEFT EYE INTRAOCULAR LENS AND REPLACEMENT   08-29-2013  . TRANSURETHRAL RESECTION OF PROSTATE N/A 09/23/2013   Procedure: TRANSURETHRAL RESECTION OF THE PROSTATE (TURP) WITH GYRUS;  Surgeon: Claybon Jabs, MD;  Location: [facility];  Service: Urology;  Laterality: N/A;  . VARICOCELECTOMY Left 07/02/2018   Procedure: VARICOCELECTOMY;  Surgeon: Kathie Rhodes, MD;  Location: [facility];  Service: Urology;  Laterality: Left;    There were no vitals filed for this  visit.   Subjective Assessment - 06/23/20 1325    Subjective  Pt reports that he is having difficulty having time to do exercises due to wife with fairly recent compresssion fx and he has had to help more at home.  Pt is still working 4 days.    Pertinent History PD    Patient Stated Goals to fasten buttons and write better    Currently in Pain? No/denies             Began instruction in importance and  in use of large amplitude movements to prevent future complications related to PD.  Pt verbalized understanding.  Practiced buttoning/unbuttoning shirt with  min cues for use of PWR! Hands prior to buttoning and use of deliberate/large amplitude movements for fastening and push-pull technique for unfastening after instruction.  Pt demo improvement with repetition and min cues for use of large amplitude movements.  Sit>stand with min cues for large amplitude movement technique review.  Standing to pick up object from the floor with big movement strategy for incr safety and ease.  Emphasized feet apart, with 1 foot in front, bending at knees with weight on heels.  Pt demo incr ease with use of strategy.  Practiced donning/doffing jacket using large amplitude movement strategies after initial instruction (cape strategy, feet apart, hands in sleeves to don and twist to doff.  Pt demo improvement with repetition and use/min cues for large amplitude movements.  Simulated ADLs with bag with focus/min cues for large amplitude movements:  Donning/doffing pull-over shirt, donning/doffing pants.  Practiced grasp/release of cylinder objects (bottles, cups) with use of PWR! Hands/large amplitude movements with min cueing.          OT Education - 06/23/20 1447    Education Details Reviewed PWR! moves in sitting;  Large amplitude movement strategies for ADLs/IADLs--see pt instructions    Person(s) Educated Patient    Methods Explanation;Demonstration;Verbal cues;Handout    Comprehension  Verbalized understanding;Returned demonstration;Verbal cues required            OT Short Term Goals - 06/02/20 1524      OT SHORT TERM GOAL #1   Title Pt will verbalize understanding of adapted strategies to maximize safety and I with ADLs/ IADLs .    Time 4    Period Weeks    Status New      OT SHORT TERM GOAL #2   Title I with PD specific HEP    Time 4    Period Weeks    Status New      OT SHORT TERM GOAL #3   Title Pt will demonstrate improved fine motor coordination for ADLs as evidenced by decreasing 9 hole peg test score for LUE by 3 secs    Time 4    Period Weeks    Status New      OT SHORT TERM GOAL #4   Title Pt will demonstrate increased ease with dressing as eveidnced by decreasing PPT#4(don/ doff jacket) to 17 secs or less    Time 4    Period Weeks    Status New      OT SHORT TERM GOAL #5   Title Pt will demonstrate improved ease with dressing as eveidenced by decreasing 3 button/ unbutton time to 35 secs or less    Time 4    Period Weeks    Status New             OT Long Term Goals - 06/19/20 1246      OT LONG TERM GOAL #1   Title Pt will demonstrate understanding of memory compensations and ways to keep thinking skills sharp    Time 8    Period Weeks    Status New      OT LONG TERM GOAL #2   Title Pt will write a short paragraph with 100% legibility and no significant decrease in letter size    Time 8    Period Weeks    Status New      OT LONG TERM GOAL #3   Title Pt will verbalize understanding of ways to prevent future PD related complications and PD community resources.    Time 8    Period Weeks    Status New      OT LONG TERM GOAL #4   Title Pt will demonstrate ability to retrieve a lightweight object from overhead shelf with RUE with elbow extension -10 or less.    Time 8    Period Weeks    Status New                 Plan - 06/23/20 1327    Clinical Impression Statement Pt responds well to cueing for incr movement  amplitude for functional tasks and responded well to large amplitude movement strategies for ADLs today after instruction.  Pt encouraged to try strategies at  home/work.    OT Occupational Profile and History Detailed Assessment- Review of Records and additional review of physical, cognitive, psychosocial history related to current functional performance    Occupational performance deficits (Please refer to evaluation for details): ADL's;IADL's;Work;Play;Leisure;Social Participation    Body Structure / Function / Physical Skills ADL;UE functional use;Flexibility;FMC;ROM;GMC;Coordination;IADL;Dexterity;Decreased knowledge of use of DME;Strength;Mobility;Endurance;Balance;Gait    Cognitive Skills Memory    Rehab Potential Good    Clinical Decision Making Limited treatment options, no task modification necessary    Comorbidities Affecting Occupational Performance: May have comorbidities impacting occupational performance    Modification or Assistance to Complete Evaluation  No modification of tasks or assist necessary to complete eval    OT Frequency 2x / week   plus eval, may d/c after 6 weeks   OT Duration 8 weeks    OT Treatment/Interventions Self-care/ADL training;Ultrasound;DME and/or AE instruction;Patient/family education;Balance training;Passive range of motion;Gait Training;Cryotherapy;Fluidtherapy;Functional Mobility Training;Moist Heat;Therapeutic exercise;Manual Therapy;Cognitive remediation/compensation;Neuromuscular education    Plan continue with education on large amplitude movement strategies for ADLs and practice of strategies.    Consulted and Agree with Plan of Care Patient           Patient will benefit from skilled therapeutic intervention in order to improve the following deficits and impairments:   Body Structure / Function / Physical Skills: ADL, UE functional use, Flexibility, FMC, ROM, GMC, Coordination, IADL, Dexterity, Decreased knowledge of use of DME, Strength,  Mobility, Endurance, Balance, Gait Cognitive Skills: Memory     Visit Diagnosis: Other symptoms and signs involving the nervous system  Other lack of coordination  Abnormal posture  Unsteadiness on feet  Stiffness of right elbow, not elsewhere classified  Stiffness of left elbow, not elsewhere classified  Other abnormalities of gait and mobility    Problem List Patient Active Problem List   Diagnosis Date Noted  . BPH (benign prostatic hypertrophy) with urinary retention 09/23/2013    [facility] 06/23/2020, 2:58 PM  [facility] [address] [place], [place], [address] Phone: [phone]   Fax:  [phone]  Name: Joshua Browning MRN: [id number] Date of Birth: 10-Dec-1945   Vianne Bulls, OTR/L [facility] [address] [place], [place]  [address] [phone] phone [phone] 06/23/20 2:58 PM

## 2020-06-25 ENCOUNTER — Other Ambulatory Visit: Payer: Self-pay | Admitting: Neurology

## 2020-06-26 ENCOUNTER — Ambulatory Visit: Payer: Medicare Other | Admitting: Occupational Therapy

## 2020-06-26 ENCOUNTER — Other Ambulatory Visit: Payer: Self-pay

## 2020-06-26 ENCOUNTER — Ambulatory Visit: Payer: Medicare Other

## 2020-06-26 DIAGNOSIS — M25621 Stiffness of right elbow, not elsewhere classified: Secondary | ICD-10-CM

## 2020-06-26 DIAGNOSIS — R29818 Other symptoms and signs involving the nervous system: Secondary | ICD-10-CM

## 2020-06-26 DIAGNOSIS — R278 Other lack of coordination: Secondary | ICD-10-CM

## 2020-06-26 DIAGNOSIS — R471 Dysarthria and anarthria: Secondary | ICD-10-CM

## 2020-06-26 DIAGNOSIS — R293 Abnormal posture: Secondary | ICD-10-CM

## 2020-06-26 DIAGNOSIS — M25622 Stiffness of left elbow, not elsewhere classified: Secondary | ICD-10-CM

## 2020-06-26 DIAGNOSIS — R2681 Unsteadiness on feet: Secondary | ICD-10-CM

## 2020-06-26 NOTE — Patient Instructions (Signed)
  Please complete the assigned speech therapy homework prior to your next session and return it to the speech therapist at your next visit.   Work 10-15 minutes on the worksheets after you to your loud "hey" and everyday sentences.

## 2020-06-26 NOTE — Therapy (Signed)
[facility] [address] [place], [place], [address] Phone: [phone]   Fax:  [phone]  Speech Language Pathology Treatment  Patient Details  Name: Joshua Browning MRN: [id number] Date of Birth: 1945/09/27 Referring Provider (SLP): Alonza Bogus, DO   Encounter Date: 06/26/2020   End of Session - 06/26/20 1405    Visit Number 8    Number of Visits 17    Date for SLP Re-Evaluation 08/28/20    SLP Start Time 1320    SLP Stop Time  1402    SLP Time Calculation (min) 42 min    Activity Tolerance Patient tolerated treatment well           Past Medical History:  Diagnosis Date  . BPH (benign prostatic hyperplasia)   . Foley catheter in place    removed after turp  . GERD (gastroesophageal reflux disease)   . Nocturnal leg cramps    occ  . Urinary retention   . Wears glasses     Past Surgical History:  Procedure Laterality Date  . CATARACT EXTRACTION W/ INTRAOCULAR LENS  IMPLANT, BILATERAL Bilateral   . INGUINAL HERNIA REPAIR Bilateral RIGHT x 2 2947 umbilical done with last right   left last one done 10 yrs ago  . RE-DO RIGHT INGUINAL HERNIA REPAIR AND UMBILICAL HERNIA REPAIR  2001  . REMOVAL LEFT EYE INTRAOCULAR LENS AND REPLACEMENT   08-29-2013  . TRANSURETHRAL RESECTION OF PROSTATE N/A 09/23/2013   Procedure: TRANSURETHRAL RESECTION OF THE PROSTATE (TURP) WITH GYRUS;  Surgeon: Claybon Jabs, MD;  Location: [facility];  Service: Urology;  Laterality: N/A;  . VARICOCELECTOMY Left 07/02/2018   Procedure: VARICOCELECTOMY;  Surgeon: Kathie Rhodes, MD;  Location: [facility];  Service: Urology;  Laterality: Left;    There were no vitals filed for this visit.   Subjective Assessment - 06/26/20 1323    Subjective (re: OT) "I'm learning so much."    Currently in Pain? 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 Pt tracking therapy appointments by having them input into his phone personally so he tracks his appoinrments via his calendar app. In conversation (simple) pt req'd occasional min cues for WNL volume in 20 minute monologue. Pt stated he "didn't really think" about talking louder, but pt states he knows he is louder than initial eval. SLP agreed. SLP provided pt with multisentence homework for louder speech to work on 10-15 minutes after loud /a/ and sentences.       Assessment / Recommendations / Plan   Plan Continue with current plan of care      Progression Toward Goals   Progression toward goals Progressing toward goals              SLP Short Term Goals - 06/26/20 1649      SLP SHORT TERM GOAL #1   Title pt will answer questions about his swallowing safety at mealtimes    Status Achieved      SLP SHORT TERM GOAL #2   Title pt will produce loud /a/ or "hey!" with at  least mid-upper 80s dB average over three sessions    Baseline 06-16-20    Status Partially Met      SLP SHORT TERM GOAL #3   Title Pt will produce 3 minutes simple conversation with WNL volume over three sessions    Status Not Met      SLP SHORT TERM GOAL #4   Title pt will track appointment schedule and/or to-do lists via external cues during 4 sessions    Baseline 06-16-20, 06-23-20, 06-26-20    Status Partially Met            SLP Long Term Goals - 06/26/20 1649      SLP LONG TERM GOAL #1   Title Pt will produce 10 minutes simple-mod complex conversation with WNL volume over three sessions    Time 5    Period Weeks   or 17 total sessions, for all LTGs   Status On-going      SLP LONG TERM GOAL #2   Title pt will  produce loud /a/ or "hey!" with upper 80s dB average over three sessions    Time 5    Period Weeks    Status On-going      SLP LONG TERM GOAL #3   Title Pt will tell SLP 3 overt s/sx aspiration in 2 sessions with modified independence    Time 5    Period Weeks    Status On-going            Plan - 06/26/20 1648    Clinical Impression Statement Mr. Demartin 's speech in mod complex conversatino of 30 mintues was more in WNL than in prevoius sessions. See "skilled intervention" for more details about today's session.  Intelligible speech is important at his job, when he is on the radio and face to face communication. SLP believes pt would benefit from skilled ST targeting improved speech loudness.    Speech Therapy Frequency 2x / week    Duration 8 weeks   8 weeks or !7 visits   Treatment/Interventions Functional tasks;Compensatory techniques;SLP instruction and feedback;Compensatory strategies;Internal/external aids;Patient/family education;Diet toleration management by SLP;Trials of upgraded texture/liquids;Cueing hierarchy;Cognitive reorganization;Pharyngeal strengthening exercises;Aspiration precaution training;Environmental controls    Potential to Achieve Goals Good           Patient will benefit from skilled therapeutic intervention in order to improve the following deficits and impairments:   Dysarthria and anarthria    Problem List Patient Active Problem List   Diagnosis Date Noted  . BPH (benign prostatic hypertrophy) with urinary retention 09/23/2013    [facility] ,[place], CCC-SLP  06/26/2020, 4:50 PM  [facility] [address] [place], [place], [address] Phone: [phone]   Fax:  [phone]   Name: Joshua Browning MRN: [id number] Date of Birth: 12-Jun-1946

## 2020-06-26 NOTE — Therapy (Signed)
[facility] [address] [place], [place], [address] Phone: [phone]   Fax:  [phone]  Occupational Therapy Treatment  Patient Details  Name: Joshua Browning MRN: [id number] Date of Birth: 03-16-1946 Referring Provider (OT): Dr. Carles Collet   Encounter Date: 06/26/2020   OT End of Session - 06/26/20 1256    Visit Number 5    Number of Visits 17    Date for OT Re-Evaluation 07/28/20    Authorization Time Period 12 weeks (pt is only scheduled for 6 weeks)    Authorization - Visit Number 5    Authorization - Number of Visits 10    OT Start Time 6010    OT Stop Time 1315    OT Time Calculation (min) 40 min    Activity Tolerance Patient tolerated treatment well    Behavior During Therapy [facility] for tasks assessed/performed           Past Medical History:  Diagnosis Date  . BPH (benign prostatic hyperplasia)   . Foley catheter in place    removed after turp  . GERD (gastroesophageal reflux disease)   . Nocturnal leg cramps    occ  . Urinary retention   . Wears glasses     Past Surgical History:  Procedure Laterality Date  . CATARACT EXTRACTION W/ INTRAOCULAR LENS  IMPLANT, BILATERAL Bilateral   . INGUINAL HERNIA REPAIR Bilateral RIGHT x 2 9323 umbilical done with last right   left last one done 10 yrs ago  . RE-DO RIGHT INGUINAL HERNIA REPAIR AND UMBILICAL HERNIA REPAIR  2001  . REMOVAL LEFT EYE INTRAOCULAR LENS AND REPLACEMENT   08-29-2013  . TRANSURETHRAL RESECTION OF PROSTATE N/A 09/23/2013   Procedure: TRANSURETHRAL RESECTION OF THE PROSTATE (TURP) WITH GYRUS;  Surgeon: Claybon Jabs, MD;  Location: [facility];  Service: Urology;  Laterality: N/A;  . VARICOCELECTOMY Left 07/02/2018   Procedure: VARICOCELECTOMY;  Surgeon: Kathie Rhodes, MD;  Location: [facility];  Service: Urology;  Laterality: Left;    There were no vitals filed for this visit.   Subjective Assessment -  06/26/20 1308    Subjective  Pt denies pain    Pertinent History PD    Patient Stated Goals to fasten buttons and write better    Currently in Pain? No/denies                  Treatment: PWR! Modified quadraped PWR! Up, rock and twist, min-mod facilitation, 10 reps each, at countertop-did not issue as HEP due to multiple LOB,requiring close supervision/ minguard.. Grooved pegs for increased fine motor coordination, mod difficulty with LUE, min difficulty with RUE. Arm bike x 5 mins level for conditioning, pt maintained 40 rpm.              OT Education - 06/26/20 1305    Education Details Large movement strategies with donning/ dofmin-mod v.c fing jacket, strategies for sit-stand from and chair, bag exercises for donning/ doffing pullover shirt, donning pants, crumpling bag to simulate pulling on socks    Person(s) Educated Patient    Methods Explanation;Demonstration;Verbal cues    Comprehension Verbalized understanding;Returned demonstration;Verbal cues required            OT Short Term Goals - 06/26/20 1246      OT SHORT TERM GOAL #1   Title Pt will verbalize understanding of adapted strategies to maximize safety and I with ADLs/ IADLs .    Time 4  Period Weeks    Status On-going      OT SHORT TERM GOAL #2   Title I with PD specific HEP    Time 4    Period Weeks    Status On-going      OT SHORT TERM GOAL #3   Title Pt will demonstrate improved fine motor coordination for ADLs as evidenced by decreasing 9 hole peg test score for LUE by 3 secs    Time 4    Period Weeks    Status On-going      OT SHORT TERM GOAL #4   Title Pt will demonstrate increased ease with dressing as eveidnced by decreasing PPT#4(don/ doff jacket) to 17 secs or less    Time 4    Period Weeks    Status On-going      OT SHORT TERM GOAL #5   Title Pt will demonstrate improved ease with dressing as eveidenced by decreasing 3 button/ unbutton time to 35 secs or less    Time 4     Period Weeks    Status On-going             OT Long Term Goals - 06/26/20 1247      OT LONG TERM GOAL #1   Title Pt will demonstrate understanding of memory compensations and ways to keep thinking skills sharp    Time 8    Period Weeks    Status On-going      OT LONG TERM GOAL #2   Title Pt will write a short paragraph with 100% legibility and no significant decrease in letter size    Time 8    Period Weeks    Status On-going      OT LONG TERM GOAL #3   Title Pt will verbalize understanding of ways to prevent future PD related complications and PD community resources.    Time 8    Period Weeks    Status New      OT LONG TERM GOAL #4   Title Pt will demonstrate ability to retrieve a lightweight object from overhead shelf with RUE with elbow extension -10 or less.    Time 8    Period Weeks    Status New                 Plan - 06/26/20 1405    Clinical Impression Statement Pt is progressing towards goals with improving big movement strategies for ADLs.    OT Occupational Profile and History Detailed Assessment- Review of Records and additional review of physical, cognitive, psychosocial history related to current functional performance    Occupational performance deficits (Please refer to evaluation for details): ADL's;IADL's;Work;Play;Leisure;Social Participation    Body Structure / Function / Physical Skills ADL;UE functional use;Flexibility;FMC;ROM;GMC;Coordination;IADL;Dexterity;Decreased knowledge of use of DME;Strength;Mobility;Endurance;Balance;Gait    Cognitive Skills Memory    Rehab Potential Good    Clinical Decision Making Limited treatment options, no task modification necessary    Comorbidities Affecting Occupational Performance: May have comorbidities impacting occupational performance    Modification or Assistance to Complete Evaluation  No modification of tasks or assist necessary to complete eval    OT Frequency 2x / week   plus eval, may d/c  after 6 weeks   OT Duration 8 weeks    OT Treatment/Interventions Self-care/ADL training;Ultrasound;DME and/or AE instruction;Patient/family education;Balance training;Passive range of motion;Gait Training;Cryotherapy;Fluidtherapy;Functional Mobility Training;Moist Heat;Therapeutic exercise;Manual Therapy;Cognitive remediation/compensation;Neuromuscular education    Plan continue to work towards goals, consider supine PWR!,  ADL strategies  Consulted and Agree with Plan of Care Patient           Patient will benefit from skilled therapeutic intervention in order to improve the following deficits and impairments:   Body Structure / Function / Physical Skills: ADL, UE functional use, Flexibility, FMC, ROM, GMC, Coordination, IADL, Dexterity, Decreased knowledge of use of DME, Strength, Mobility, Endurance, Balance, Gait Cognitive Skills: Memory     Visit Diagnosis: Other symptoms and signs involving the nervous system  Other lack of coordination  Abnormal posture  Unsteadiness on feet  Stiffness of right elbow, not elsewhere classified  Stiffness of left elbow, not elsewhere classified    Problem List Patient Active Problem List   Diagnosis Date Noted  . BPH (benign prostatic hypertrophy) with urinary retention 09/23/2013    T[facility] Carreker 06/26/2020, 2:07 PM Theone Murdoch, OTR/L [phone] Phone: [phone] 2:07 PM 06/26/20 [facility] [address] [place], [place], [address] Phone: [phone]   Fax:  [phone]  Name: Joshua Browning MRN: [id number] Date of Birth: 1945/09/29

## 2020-06-30 ENCOUNTER — Ambulatory Visit: Payer: Medicare Other

## 2020-06-30 ENCOUNTER — Ambulatory Visit: Payer: Medicare Other | Admitting: Occupational Therapy

## 2020-06-30 ENCOUNTER — Encounter: Payer: Self-pay | Admitting: Occupational Therapy

## 2020-06-30 ENCOUNTER — Other Ambulatory Visit: Payer: Self-pay

## 2020-06-30 DIAGNOSIS — R2681 Unsteadiness on feet: Secondary | ICD-10-CM

## 2020-06-30 DIAGNOSIS — R278 Other lack of coordination: Secondary | ICD-10-CM

## 2020-06-30 DIAGNOSIS — R471 Dysarthria and anarthria: Secondary | ICD-10-CM

## 2020-06-30 DIAGNOSIS — R293 Abnormal posture: Secondary | ICD-10-CM

## 2020-06-30 DIAGNOSIS — M25622 Stiffness of left elbow, not elsewhere classified: Secondary | ICD-10-CM

## 2020-06-30 DIAGNOSIS — R29818 Other symptoms and signs involving the nervous system: Secondary | ICD-10-CM

## 2020-06-30 DIAGNOSIS — R41841 Cognitive communication deficit: Secondary | ICD-10-CM

## 2020-06-30 DIAGNOSIS — M25621 Stiffness of right elbow, not elsewhere classified: Secondary | ICD-10-CM

## 2020-06-30 NOTE — Patient Instructions (Signed)
 (  Exercise) Monday Tuesday Wednesday Thursday Friday Saturday Sunday   PWR! On your back           PWR! standing           PWR! sitting           Hand exercises

## 2020-06-30 NOTE — Therapy (Signed)
[facility] [address] [place], [place], [address] Phone: [phone]   Fax:  [phone]  Speech Language Pathology Treatment  Patient Details  Name: Joshua Browning MRN: [id number] Date of Birth: [birth year] Referring Provider (SLP): Alonza Bogus, DO   Encounter Date: 06/30/2020   End of Session - 06/30/20 1525    Visit Number 9    Number of Visits 17    Date for SLP Re-Evaluation 08/28/20    SLP Start Time 1319    SLP Stop Time  1400    SLP Time Calculation (min) 41 min    Activity Tolerance Patient tolerated treatment well           Past Medical History:  Diagnosis Date  . BPH (benign prostatic hyperplasia)   . Foley catheter in place    removed after turp  . GERD (gastroesophageal reflux disease)   . Nocturnal leg cramps    occ  . Urinary retention   . Wears glasses     Past Surgical History:  Procedure Laterality Date  . CATARACT EXTRACTION W/ INTRAOCULAR LENS  IMPLANT, BILATERAL Bilateral   . INGUINAL HERNIA REPAIR Bilateral RIGHT x 2 6962 umbilical done with last right   left last one done 10 yrs ago  . RE-DO RIGHT INGUINAL HERNIA REPAIR AND UMBILICAL HERNIA REPAIR  2001  . REMOVAL LEFT EYE INTRAOCULAR LENS AND REPLACEMENT   08-29-2013  . TRANSURETHRAL RESECTION OF PROSTATE N/A 09/23/2013   Procedure: TRANSURETHRAL RESECTION OF THE PROSTATE (TURP) WITH GYRUS;  Surgeon: Claybon Jabs, MD;  Location: [facility];  Service: Urology;  Laterality: N/A;  . VARICOCELECTOMY Left 07/02/2018   Procedure: VARICOCELECTOMY;  Surgeon: Kathie Rhodes, MD;  Location: [facility];  Service: Urology;  Laterality: Left;    There were no vitals filed for this visit.   Subjective Assessment - 06/30/20 1327    Subjective Pt description of fraudulent check was upper 60s - low 70s    Currently in Pain? No/denies                 ADULT SLP TREATMENT - 06/30/20 1330      General  Information   Behavior/Cognition Alert;Cooperative;Pleasant mood      Cognitive-Linquistic Treatment   Treatment focused on Dysarthria;Patient/family/caregiver education    Skilled Treatment Pt with explanation about fraudulent check began in lower 70s dB for 2 minutes then ended in upper 60s - low 70s dB after 8 minutes. Pt loud "hey" with mid 80s dB average, and sentences with average low 80s dB. Phrase level respnses were low 70s dB. At very end of sessio, in simple conversation pt req'd SBA for pt to keep loudness in low 70s.      Assessment / Recommendations / Plan   Plan Continue with current plan of care      Progression Toward Goals   Progression toward goals Progressing toward goals              SLP Short Term Goals - 06/26/20 1649      SLP SHORT TERM GOAL #1   Title pt will answer questions about his swallowing safety at mealtimes    Status Achieved      SLP SHORT TERM GOAL #2   Title pt will produce loud /a/ or "hey!" with at least mid-upper 80s dB average over three sessions    Baseline 06-16-20    Status Partially Met      SLP SHORT  TERM GOAL #3   Title Pt will produce 3 minutes simple conversation with WNL volume over three sessions    Status Not Met      SLP SHORT TERM GOAL #4   Title pt will track appointment schedule and/or to-do lists via external cues during 4 sessions    Baseline 06-16-20, 06-23-20, 06-26-20    Status Partially Met            SLP Long Term Goals - 06/30/20 1526      SLP LONG TERM GOAL #1   Title Pt will produce 10 minutes simple-mod complex conversation with WNL volume over three sessions    Time 4    Period Weeks   or 17 total sessions, for all LTGs   Status On-going      SLP LONG TERM GOAL #2   Title pt will produce loud /a/ or "hey!" with upper 80s dB average over three sessions    Time 4    Period Weeks    Status On-going      SLP LONG TERM GOAL #3   Title Pt will tell SLP 3 overt s/sx aspiration in 2 sessions with  modified independence    Time 4    Period Weeks    Status On-going            Plan - 06/30/20 1525    Clinical Impression Statement Mr. Trauger 's speech continues as waxing and waning around WNL in simple conversation. See "skilled intervention" for more details about today's session.  Intelligible speech is important at his job, when he is on the radio and face to face communication. SLP believes pt would benefit from skilled ST targeting improved speech loudness.    Speech Therapy Frequency 2x / week    Duration 8 weeks   8 weeks or !7 visits   Treatment/Interventions Functional tasks;Compensatory techniques;SLP instruction and feedback;Compensatory strategies;Internal/external aids;Patient/family education;Diet toleration management by SLP;Trials of upgraded texture/liquids;Cueing hierarchy;Cognitive reorganization;Pharyngeal strengthening exercises;Aspiration precaution training;Environmental controls    Potential to Achieve Goals Good           Patient will benefit from skilled therapeutic intervention in order to improve the following deficits and impairments:   Dysarthria and anarthria  Cognitive communication deficit    Problem List Patient Active Problem List   Diagnosis Date Noted  . BPH (benign prostatic hypertrophy) with urinary retention 09/23/2013    [facility] ,MS, CCC-SLP  06/30/2020, 3:26 PM  [facility] [address] [place], [place], [address] Phone: [phone]   Fax:  [phone]   Name: Joshua Browning MRN: [id number] Date of Birth: July 16, 1946

## 2020-06-30 NOTE — Therapy (Signed)
[facility] [address] [place], [place], [address] Phone: [phone]   Fax:  [phone]  Occupational Therapy Treatment  Patient Details  Name: Joshua Browning MRN: [id number] Date of Birth: 05-24-1946 Referring Provider (OT): Dr. Carles Collet   Encounter Date: 06/30/2020   OT End of Session - 06/30/20 1229    Visit Number 6    Number of Visits 17    Date for OT Re-Evaluation 07/28/20    Authorization Time Period 12 weeks (pt is only scheduled for 6 weeks)    Authorization - Visit Number 6    Authorization - Number of Visits 10    OT Start Time 6010    OT Stop Time 1318    OT Time Calculation (min) 44 min    Activity Tolerance Patient tolerated treatment well    Behavior During Therapy [facility] for tasks assessed/performed           Past Medical History:  Diagnosis Date  . BPH (benign prostatic hyperplasia)   . Foley catheter in place    removed after turp  . GERD (gastroesophageal reflux disease)   . Nocturnal leg cramps    occ  . Urinary retention   . Wears glasses     Past Surgical History:  Procedure Laterality Date  . CATARACT EXTRACTION W/ INTRAOCULAR LENS  IMPLANT, BILATERAL Bilateral   . INGUINAL HERNIA REPAIR Bilateral RIGHT x 2 9323 umbilical done with last right   left last one done 10 yrs ago  . RE-DO RIGHT INGUINAL HERNIA REPAIR AND UMBILICAL HERNIA REPAIR  2001  . REMOVAL LEFT EYE INTRAOCULAR LENS AND REPLACEMENT   08-29-2013  . TRANSURETHRAL RESECTION OF PROSTATE N/A 09/23/2013   Procedure: TRANSURETHRAL RESECTION OF THE PROSTATE (TURP) WITH GYRUS;  Surgeon: Claybon Jabs, MD;  Location: [facility];  Service: Urology;  Laterality: N/A;  . VARICOCELECTOMY Left 07/02/2018   Procedure: VARICOCELECTOMY;  Surgeon: Kathie Rhodes, MD;  Location: [facility];  Service: Urology;  Laterality: Left;    There were no vitals filed for this visit.   Subjective Assessment -  06/30/20 1229    Subjective  late on 11:30 meds, took at beginning of session    Pertinent History PD    Patient Stated Goals to fasten buttons and write better    Currently in Pain? No/denies                  OT Education - 06/30/20 1243    Education Details PWR! moves in supine (basic 4)--pt returned demo each with min cues; Supine>sitting transfer with large amplitude movement techniques and pt returned demo; PD exercise chart and how to use/tips for scheduling HEP and use of exercise as medicine for brain change; benefits of safe aerobic exercise and how to use stationary bike optimally at home; tips to incr large amplitude movements with ADLs and incorporate in daily routine    Person(s) Educated Patient    Methods Explanation;Demonstration;Verbal cues;Handout    Comprehension Verbalized understanding;Returned demonstration;Verbal cues required            OT Short Term Goals - 06/26/20 1246      OT SHORT TERM GOAL #1   Title Pt will verbalize understanding of adapted strategies to maximize safety and I with ADLs/ IADLs .    Time 4    Period Weeks    Status On-going      OT SHORT TERM GOAL #2   Title I with  PD specific HEP    Time 4    Period Weeks    Status On-going      OT SHORT TERM GOAL #3   Title Pt will demonstrate improved fine motor coordination for ADLs as evidenced by decreasing 9 hole peg test score for LUE by 3 secs    Time 4    Period Weeks    Status On-going      OT SHORT TERM GOAL #4   Title Pt will demonstrate increased ease with dressing as eveidnced by decreasing PPT#4(don/ doff jacket) to 17 secs or less    Time 4    Period Weeks    Status On-going      OT SHORT TERM GOAL #5   Title Pt will demonstrate improved ease with dressing as eveidenced by decreasing 3 button/ unbutton time to 35 secs or less    Time 4    Period Weeks    Status On-going             OT Long Term Goals - 06/26/20 1247      OT LONG TERM GOAL #1   Title Pt  will demonstrate understanding of memory compensations and ways to keep thinking skills sharp    Time 8    Period Weeks    Status On-going      OT LONG TERM GOAL #2   Title Pt will write a short paragraph with 100% legibility and no significant decrease in letter size    Time 8    Period Weeks    Status On-going      OT LONG TERM GOAL #3   Title Pt will verbalize understanding of ways to prevent future PD related complications and PD community resources.    Time 8    Period Weeks    Status New      OT LONG TERM GOAL #4   Title Pt will demonstrate ability to retrieve a lightweight object from overhead shelf with RUE with elbow extension -10 or less.    Time 8    Period Weeks    Status New                 Plan - 06/30/20 1230    Clinical Impression Statement Pt is progressing towards goals with improving use of big movement strategies for ADLs with incr ease.  However, pt will continue to benefit from reinforcement for incr carryover/calibration.    OT Occupational Profile and History Detailed Assessment- Review of Records and additional review of physical, cognitive, psychosocial history related to current functional performance    Occupational performance deficits (Please refer to evaluation for details): ADL's;IADL's;Work;Play;Leisure;Social Participation    Body Structure / Function / Physical Skills ADL;UE functional use;Flexibility;FMC;ROM;GMC;Coordination;IADL;Dexterity;Decreased knowledge of use of DME;Strength;Mobility;Endurance;Balance;Gait    Cognitive Skills Memory    Rehab Potential Good    Clinical Decision Making Limited treatment options, no task modification necessary    Comorbidities Affecting Occupational Performance: May have comorbidities impacting occupational performance    Modification or Assistance to Complete Evaluation  No modification of tasks or assist necessary to complete eval    OT Frequency 2x / week   plus eval, may d/c after 6 weeks   OT  Duration 8 weeks    OT Treatment/Interventions Self-care/ADL training;Ultrasound;DME and/or AE instruction;Patient/family education;Balance training;Passive range of motion;Gait Training;Cryotherapy;Fluidtherapy;Functional Mobility Training;Moist Heat;Therapeutic exercise;Manual Therapy;Cognitive remediation/compensation;Neuromuscular education    Plan continue to work towards goals, review supine PWR!,  ADL strategies, begin checking STGs  Consulted and Agree with Plan of Care Patient           Patient will benefit from skilled therapeutic intervention in order to improve the following deficits and impairments:   Body Structure / Function / Physical Skills: ADL, UE functional use, Flexibility, FMC, ROM, GMC, Coordination, IADL, Dexterity, Decreased knowledge of use of DME, Strength, Mobility, Endurance, Balance, Gait Cognitive Skills: Memory     Visit Diagnosis: Other symptoms and signs involving the nervous system  Other lack of coordination  Abnormal posture  Unsteadiness on feet  Stiffness of right elbow, not elsewhere classified  Stiffness of left elbow, not elsewhere classified    Problem List Patient Active Problem List   Diagnosis Date Noted  . BPH (benign prostatic hypertrophy) with urinary retention 09/23/2013    [facility] 06/30/2020, 3:04 PM  [facility] [address] [place], [place], [address] Phone: [phone]   Fax:  [phone]  Name: DUONG HAYDEL MRN: [id number] Date of Birth: [birth year]   Vianne Bulls, OTR/L [facility] [address] [place], [place]  [address] [phone] phone [phone] 06/30/20 3:04 PM

## 2020-07-03 ENCOUNTER — Ambulatory Visit: Payer: Medicare Other

## 2020-07-03 ENCOUNTER — Other Ambulatory Visit: Payer: Self-pay

## 2020-07-03 ENCOUNTER — Ambulatory Visit: Payer: Medicare Other | Admitting: Occupational Therapy

## 2020-07-03 DIAGNOSIS — R278 Other lack of coordination: Secondary | ICD-10-CM

## 2020-07-03 DIAGNOSIS — M25621 Stiffness of right elbow, not elsewhere classified: Secondary | ICD-10-CM

## 2020-07-03 DIAGNOSIS — R471 Dysarthria and anarthria: Secondary | ICD-10-CM

## 2020-07-03 DIAGNOSIS — R293 Abnormal posture: Secondary | ICD-10-CM

## 2020-07-03 DIAGNOSIS — R29818 Other symptoms and signs involving the nervous system: Secondary | ICD-10-CM

## 2020-07-03 DIAGNOSIS — M25622 Stiffness of left elbow, not elsewhere classified: Secondary | ICD-10-CM

## 2020-07-03 NOTE — Therapy (Signed)
[facility] [address] [place], [place], [address] Phone: [phone]   Fax:  [phone]  Occupational Therapy Treatment  Patient Details  Name: Joshua Browning MRN: [id number] Date of Birth: [birth year] Referring Provider (OT): Dr. Carles Collet   Encounter Date: 07/03/2020   OT End of Session - 07/03/20 1152    Visit Number 7    Number of Visits 17    Date for OT Re-Evaluation 07/28/20    Authorization Type [employer]    Authorization Time Period 12 weeks (pt is only scheduled for 6 weeks)    Authorization - Visit Number 7    Authorization - Number of Visits 10    OT Start Time 9937    OT Stop Time 1230    OT Time Calculation (min) 39 min           Past Medical History:  Diagnosis Date  . BPH (benign prostatic hyperplasia)   . Foley catheter in place    removed after turp  . GERD (gastroesophageal reflux disease)   . Nocturnal leg cramps    occ  . Urinary retention   . Wears glasses     Past Surgical History:  Procedure Laterality Date  . CATARACT EXTRACTION W/ INTRAOCULAR LENS  IMPLANT, BILATERAL Bilateral   . INGUINAL HERNIA REPAIR Bilateral RIGHT x 2 1696 umbilical done with last right   left last one done 10 yrs ago  . RE-DO RIGHT INGUINAL HERNIA REPAIR AND UMBILICAL HERNIA REPAIR  2001  . REMOVAL LEFT EYE INTRAOCULAR LENS AND REPLACEMENT   08-29-2013  . TRANSURETHRAL RESECTION OF PROSTATE N/A 09/23/2013   Procedure: TRANSURETHRAL RESECTION OF THE PROSTATE (TURP) WITH GYRUS;  Surgeon: Claybon Jabs, MD;  Location: [facility];  Service: Urology;  Laterality: N/A;  . VARICOCELECTOMY Left 07/02/2018   Procedure: VARICOCELECTOMY;  Surgeon: Kathie Rhodes, MD;  Location: [facility];  Service: Urology;  Laterality: Left;    There were no vitals filed for this visit.   Subjective Assessment - 07/03/20 1151    Subjective  denies pain    Pertinent History PD    Patient Stated  Goals to fasten buttons and write better    Currently in Pain? No/denies              [facility] OT Assessment - 07/03/20 0001      Observation/Other Assessments   Donning Doffing Jacket Comments 16.31      Coordination   Left 9 Hole Peg Test 31.09              Treatment: Arm bike x 6 mins level 1 for conditioning PWR! Hands basic 4, min-mod v.c 5-10 reps each              OT Treatment/ Education - 07/03/20 1227    Education Details PWR! moves basic 4 seated, 10 reps each min v.c and demonstration, strategies for fastening buttons and donning jacket with big movements, checked progress towards short term goals    Person(s) Educated Patient    Methods Explanation;Demonstration;Verbal cues;Handout    Comprehension Verbalized understanding;Returned demonstration;Verbal cues required            OT Short Term Goals - 07/03/20 1200      OT SHORT TERM GOAL #1   Title Pt will verbalize understanding of adapted strategies to maximize safety and I with ADLs/ IADLs .    Time 4    Period Weeks    Status  On-going      OT SHORT TERM GOAL #2   Title I with PD specific HEP    Time 4    Period Weeks    Status Achieved      OT SHORT TERM GOAL #3   Title Pt will demonstrate improved fine motor coordination for ADLs as evidenced by decreasing 9 hole peg test score for LUE by 3 secs    Time 4    Period Weeks    Status On-going   31.09     OT SHORT TERM GOAL #4   Title Pt will demonstrate increased ease with dressing as eveidnced by decreasing PPT#4(don/ doff jacket) to 17 secs or less    Time 4    Period Weeks    Status Achieved   16.25     OT SHORT TERM GOAL #5   Title Pt will demonstrate improved ease with dressing as eveidenced by decreasing 3 button/ unbutton time to 35 secs or less    Time 4    Period Weeks    Status Achieved   16.31            OT Long Term Goals - 06/26/20 1247      OT LONG TERM GOAL #1   Title Pt will demonstrate understanding of  memory compensations and ways to keep thinking skills sharp    Time 8    Period Weeks    Status On-going      OT LONG TERM GOAL #2   Title Pt will write a short paragraph with 100% legibility and no significant decrease in letter size    Time 8    Period Weeks    Status On-going      OT LONG TERM GOAL #3   Title Pt will verbalize understanding of ways to prevent future PD related complications and PD community resources.    Time 8    Period Weeks    Status New      OT LONG TERM GOAL #4   Title Pt will demonstrate ability to retrieve a lightweight object from overhead shelf with RUE with elbow extension -10 or less.    Time 8    Period Weeks    Status New                 Plan - 07/03/20 1152    Clinical Impression Statement Pt is progressing towards goals with improving use of big movement strategies for ADLs with incr ease.  Pt demonstrates good progress towards short term goals.    OT Occupational Profile and History Detailed Assessment- Review of Records and additional review of physical, cognitive, psychosocial history related to current functional performance    Occupational performance deficits (Please refer to evaluation for details): ADL's;IADL's;Work;Play;Leisure;Social Participation    Body Structure / Function / Physical Skills ADL;UE functional use;Flexibility;FMC;ROM;GMC;Coordination;IADL;Dexterity;Decreased knowledge of use of DME;Strength;Mobility;Endurance;Balance;Gait    Cognitive Skills Memory    Rehab Potential Good    Clinical Decision Making Limited treatment options, no task modification necessary    Comorbidities Affecting Occupational Performance: May have comorbidities impacting occupational performance    Modification or Assistance to Complete Evaluation  No modification of tasks or assist necessary to complete eval    OT Frequency 2x / week   plus eval, may d/c after 6 weeks   OT Duration 8 weeks    OT Treatment/Interventions Self-care/ADL  training;Ultrasound;DME and/or AE instruction;Patient/family education;Balance training;Passive range of motion;Gait Training;Cryotherapy;Fluidtherapy;Functional Mobility Training;Moist Heat;Therapeutic exercise;Manual Therapy;Cognitive remediation/compensation;Neuromuscular education  Plan   Education  regarding community resources, and ways to prevent future complication continue to work towards goals,    Consulted and Agree with Plan of Care Patient         Patient will benefit from skilled therapeutic intervention in order to improve the following deficits and impairments:   Body Structure / Function / Physical Skills: ADL, UE functional use, Flexibility, FMC, ROM, GMC, Coordination, IADL, Dexterity, Decreased knowledge of use of DME, Strength, Mobility, Endurance, Balance, Gait Cognitive Skills: Memory     Visit Diagnosis: Other symptoms and signs involving the nervous system  Other lack of coordination  Abnormal posture  Stiffness of right elbow, not elsewhere classified  Stiffness of left elbow, not elsewhere classified    Problem List Patient Active Problem List   Diagnosis Date Noted  . BPH (benign prostatic hypertrophy) with urinary retention 09/23/2013    Oluwaseun Cremer 07/03/2020, 12:28 PM Theone Murdoch, OTR/L [phone] Phone: [phone] 12:30 PM 07/03/20 [facility] [address] [place], [place], [address] Phone: [phone]   Fax:  [phone]  Name: Joshua Browning MRN: [id number] Date of Birth: October 14, 1945

## 2020-07-03 NOTE — Therapy (Signed)
[facility] [address] [place], [place], [address] Phone: [phone]   Fax:  [phone]  Speech Language Pathology Treatment/Progress Note  Patient Details  Name: Joshua Browning MRN: [id number] Date of Birth: 02-08-1946 Referring Provider (SLP): Joshua Bogus, DO   Encounter Date: 07/03/2020   End of Session - 07/03/20 1152    Visit Number 10    Number of Visits 17    Date for SLP Re-Evaluation 08/28/20    SLP Start Time 1105    SLP Stop Time  1146    SLP Time Calculation (min) 41 min    Activity Tolerance Patient tolerated treatment well           Past Medical History:  Diagnosis Date  . BPH (benign prostatic hyperplasia)   . Foley catheter in place    removed after turp  . GERD (gastroesophageal reflux disease)   . Nocturnal leg cramps    occ  . Urinary retention   . Wears glasses     Past Surgical History:  Procedure Laterality Date  . CATARACT EXTRACTION W/ INTRAOCULAR LENS  IMPLANT, BILATERAL Bilateral   . INGUINAL HERNIA REPAIR Bilateral RIGHT x 2 3532 umbilical done with last right   left last one done 10 yrs ago  . RE-DO RIGHT INGUINAL HERNIA REPAIR AND UMBILICAL HERNIA REPAIR  2001  . REMOVAL LEFT EYE INTRAOCULAR LENS AND REPLACEMENT   08-29-2013  . TRANSURETHRAL RESECTION OF PROSTATE N/A 09/23/2013   Procedure: TRANSURETHRAL RESECTION OF THE PROSTATE (TURP) WITH GYRUS;  Surgeon: Joshua Jabs, MD;  Location: [facility];  Service: Urology;  Laterality: N/A;  . VARICOCELECTOMY Left 07/02/2018   Procedure: VARICOCELECTOMY;  Surgeon: Kathie Rhodes, MD;  Location: [facility];  Service: Urology;  Laterality: Left;    Speech Therapy Progress Note  Dates of Reporting Period: 05-14-20 to present  Subjective Statement: Pt has been seen for 10 ST sessions focusing on speech loudness.  Objective: Pt's volume has improved intermittently in simple-mod complex  conversation, reports wife is asking him to repeat himself less at home than prior to [place].  Goal Update: see above  Plan: See above  Reason Skilled Services are Required: pt has not habitualized louder conversational speech.    There were no vitals filed for this visit.   Subjective Assessment - 07/03/20 1114    Subjective "Especially at home I have been practicing."    Currently in Pain? No/denies                 ADULT SLP TREATMENT - 07/03/20 1115      General Information   Behavior/Cognition Alert;Cooperative;Pleasant mood      Treatment Provided   Treatment provided Cognitive-Linquistic      Cognitive-Linquistic Treatment   Treatment focused on Dysarthria;Patient/family/caregiver education    Skilled Treatment Pt states his wife now asks him less for repeats than pre-ST. Also, his work crew during lunch asks him less frequenty for repeats. Pt family to arive this weekend and pt stated "It will be a good test to see if I can be louder." SLP encouraged pt to think about this over the weekend. In simle to mod complex conversation pt maintained WNL volume with rare min A (verbal and nonverbal) 10 minutes. SLP then had pt perform "HEY" with average upper 70s dB until SLP cued pt to improve loudness and pt reached mid 80s dB consistently. Everyday sentences in upper 70s - low 80s dB until SLP  cued pt then he got in low 80s average. Mod copmlex onversation after this pt maintained low70s dB for 6 minutes until decr noted. Nonverbal cues were used by SLP to foster continueation of WNL loudness for another 4 mintues. Pt noticed he got softer each time ("Yah, I was thikninga bout what I was saying") but did not spontaneously change this effort. Pt homework is to talk in at least one conversation per day to focus on louder speech.  SLP reminded pt about the external cues.       Assessment / Recommendations / Plan   Plan Continue with current plan of care      Progression Toward Goals     Progression toward goals Progressing toward goals              SLP Short Term Goals - 06/26/20 1649      SLP SHORT TERM GOAL #1   Title pt will answer questions about his swallowing safety at mealtimes    Status Achieved      SLP SHORT TERM GOAL #2   Title pt will produce loud /a/ or "hey!" with at least mid-upper 80s dB average over three sessions    Baseline 06-16-20    Status Partially Met      SLP SHORT TERM GOAL #3   Title Pt will produce 3 minutes simple conversation with WNL volume over three sessions    Status Not Met      SLP SHORT TERM GOAL #4   Title pt will track appointment schedule and/or to-do lists via external cues during 4 sessions    Baseline 06-16-20, 06-23-20, 06-26-20    Status Partially Met            SLP Long Term Goals - 07/03/20 1153      SLP LONG TERM GOAL #1   Title Pt will produce 10 minutes simple-mod complex conversation with WNL volume over three sessions    Time 4    Period Weeks   or 17 total sessions, for all LTGs   Status On-going      SLP LONG TERM GOAL #2   Title pt will produce loud /a/ or "hey!" with upper 80s dB average over three sessions    Time 4    Period Weeks    Status On-going      SLP LONG TERM GOAL #3   Title Pt will tell SLP 3 overt s/sx aspiration in 2 sessions with modified independence    Time 4    Period Weeks    Status On-going            Plan - 07/03/20 1152    Clinical Impression Statement [name] 's speech continues to move in and out of WNL volume in simple to mod complex conversation - he is now aware of reduction in volume but does not incr volume when this occurs. See "skilled intervention" for more details about today's session.  Intelligible speech is important at his job, when he is on the radio and face to face communication. SLP believes pt would benefit from skilled ST targeting improved speech loudness.    Speech Therapy Frequency 2x / week    Duration 8 weeks   8 weeks or !7 visits    Treatment/Interventions Functional tasks;Compensatory techniques;SLP instruction and feedback;Compensatory strategies;Internal/external aids;Patient/family education;Diet toleration management by SLP;Trials of upgraded texture/liquids;Cueing hierarchy;Cognitive reorganization;Pharyngeal strengthening exercises;Aspiration precaution training;Environmental controls    Potential to Achieve Goals Good             Patient will benefit from skilled therapeutic intervention in order to improve the following deficits and impairments:   Dysarthria and anarthria    Problem List Patient Active Problem List   Diagnosis Date Noted  . BPH (benign prostatic hypertrophy) with urinary retention 09/23/2013    [facility] ,MS, CCC-SLP  07/03/2020, 11:54 AM  [facility] [address] [place], [place], [address] Phone: [phone]   Fax:  [phone]   Name: Joshua Browning MRN: [id number] Date of Birth: 06-24-1946

## 2020-07-06 ENCOUNTER — Ambulatory Visit: Payer: Medicare Other | Admitting: Speech Pathology

## 2020-07-06 ENCOUNTER — Ambulatory Visit: Payer: Medicare Other | Admitting: Occupational Therapy

## 2020-07-06 ENCOUNTER — Other Ambulatory Visit: Payer: Self-pay

## 2020-07-06 ENCOUNTER — Encounter: Payer: Self-pay | Admitting: Speech Pathology

## 2020-07-06 DIAGNOSIS — R471 Dysarthria and anarthria: Secondary | ICD-10-CM

## 2020-07-06 DIAGNOSIS — R278 Other lack of coordination: Secondary | ICD-10-CM

## 2020-07-06 DIAGNOSIS — R293 Abnormal posture: Secondary | ICD-10-CM

## 2020-07-06 DIAGNOSIS — R29818 Other symptoms and signs involving the nervous system: Secondary | ICD-10-CM

## 2020-07-06 DIAGNOSIS — M25621 Stiffness of right elbow, not elsewhere classified: Secondary | ICD-10-CM

## 2020-07-06 DIAGNOSIS — M25622 Stiffness of left elbow, not elsewhere classified: Secondary | ICD-10-CM

## 2020-07-06 NOTE — Therapy (Signed)
[facility] [address] [place], [place], [address] Phone: [phone]   Fax:  [phone]  Speech Language Pathology Treatment & Discharge Summary  Patient Details  Name: Joshua Browning MRN: [id number] Date of Birth: 04-21-1946 Referring Provider (SLP): Alonza Bogus, DO   Encounter Date: 07/06/2020   End of Session - 07/06/20 1602    Visit Number 11    Number of Visits 17    Date for SLP Re-Evaluation 08/28/20    SLP Start Time 6283    SLP Stop Time  1517    SLP Time Calculation (min) 43 min           Past Medical History:  Diagnosis Date  . BPH (benign prostatic hyperplasia)   . Foley catheter in place    removed after turp  . GERD (gastroesophageal reflux disease)   . Nocturnal leg cramps    occ  . Urinary retention   . Wears glasses     Past Surgical History:  Procedure Laterality Date  . CATARACT EXTRACTION W/ INTRAOCULAR LENS  IMPLANT, BILATERAL Bilateral   . INGUINAL HERNIA REPAIR Bilateral RIGHT x 2 6160 umbilical done with last right   left last one done 10 yrs ago  . RE-DO RIGHT INGUINAL HERNIA REPAIR AND UMBILICAL HERNIA REPAIR  2001  . REMOVAL LEFT EYE INTRAOCULAR LENS AND REPLACEMENT   08-29-2013  . TRANSURETHRAL RESECTION OF PROSTATE N/A 09/23/2013   Procedure: TRANSURETHRAL RESECTION OF THE PROSTATE (TURP) WITH GYRUS;  Surgeon: Claybon Jabs, MD;  Location: [facility];  Service: Urology;  Laterality: N/A;  . VARICOCELECTOMY Left 07/02/2018   Procedure: VARICOCELECTOMY;  Surgeon: Kathie Rhodes, MD;  Location: [facility];  Service: Urology;  Laterality: Left;    There were no vitals filed for this visit.   Subjective Assessment - 07/06/20 1405    Subjective "I think my wife is starting to hear me beter"    Currently in Pain? No/denies                 ADULT SLP TREATMENT - 07/06/20 1406      General Information   Behavior/Cognition  Alert;Cooperative;Pleasant mood      Treatment Provided   Treatment provided Cognitive-Linquistic      Cognitive-Linquistic Treatment   Treatment focused on Dysarthria;Patient/family/caregiver education    [place] reports his work crew is not asking him to repeat as much outside of the [place]. Inside the [facility] it is still harder for pt due to noise. Pt enters room with volume at 70 dB. Loud /a/ initially in 70's - with modeling and cues, pt achieved average of 83dB. In simple conversation with rare (1) visual cue pt maintained average of 70 dB. Spouse is not asking for repeats, but does cue him for volume         SPEECH THERAPY DISCHARGE SUMMARY  Visits from Start of Care: 11  Current functional level related to goals / functional outcomes: See goals below   Remaining deficits: Mild hypokinetic dysarthria   Education / Equipment: HEP and compensations for dysarthria; s/s of aspiration pna Plan: Patient agrees to discharge.  Patient goals were met. Patient is being discharged due to meeting the stated rehab goals.  ?????         SLP Short Term Goals - 07/06/20 1600      SLP SHORT TERM GOAL #1   Title pt will answer questions about his swallowing safety at mealtimes  Status Achieved      SLP SHORT TERM GOAL #2   Title pt will produce loud /a/ or "hey!" with at least mid-upper 80s dB average over three sessions    Baseline 06-16-20    Status Partially Met      SLP SHORT TERM GOAL #3   Title Pt will produce 3 minutes simple conversation with WNL volume over three sessions    Status Not Met      SLP SHORT TERM GOAL #4   Title pt will track appointment schedule and/or to-do lists via external cues during 4 sessions    Baseline 06-16-20, 06-23-20, 06-26-20    Status Partially Met            SLP Long Term Goals - 07/06/20 1433      SLP LONG TERM GOAL #1   Title Pt will produce 10 minutes simple-mod complex conversation with WNL volume over three sessions     Time 3    Period Weeks   or 17 total sessions, for all LTGs   Status Achieved      SLP LONG TERM GOAL #2   Title pt will produce loud /a/ or "hey!" with upper 80s dB average over three sessions    Time 3    Period Weeks    Status Achieved      SLP LONG TERM GOAL #3   Title Pt will tell SLP 3 overt s/sx aspiration in 2 sessions with modified independence    Time 3    Period Weeks    Status Achieved            Plan - 07/06/20 1558    Clinical Impression Statement Mr. Rebekah Chesterfield has maintained 70dB in conversation with supervision cues and reports success being heard at work and home. He is requesting d/c from [place] and will continue to practice. His spouse gives him feedback regarding his volume. Mr Lott has 2-3 more OT visits and will schedule 6 month PD screen upon d/c from OT. Goals met, d/c ST    Speech Therapy Frequency 2x / week    Treatment/Interventions Functional tasks;Compensatory techniques;SLP instruction and feedback;Compensatory strategies;Internal/external aids;Patient/family education;Diet toleration management by SLP;Trials of upgraded texture/liquids;Cueing hierarchy;Cognitive reorganization;Pharyngeal strengthening exercises;Aspiration precaution training;Environmental controls    Potential to Achieve Goals Good           Patient will benefit from skilled therapeutic intervention in order to improve the following deficits and impairments:   Dysarthria and anarthria    Problem List Patient Active Problem List   Diagnosis Date Noted  . BPH (benign prostatic hypertrophy) with urinary retention 09/23/2013    Arsen Mangione, Annye Rusk MS, CCC-SLP 07/06/2020, 4:05 PM  [facility] [address] [place], [place], [address] Phone: [phone]   Fax:  [phone]   Name: EGBERT SEIDEL MRN: [id number] Date of Birth: [birth year]

## 2020-07-06 NOTE — Therapy (Signed)
[facility] [address] [place], [place], [address] Phone: [phone]   Fax:  [phone]  Occupational Therapy Treatment  Patient Details  Name: Joshua Browning MRN: [id number] Date of Birth: 06/22/1946 Referring Provider (OT): Dr. Carles Collet   Encounter Date: 07/06/2020   OT End of Session - 07/06/20 1350    Visit Number 8    Number of Visits 17    Authorization Type [employer]    Authorization Time Period 12 weeks (pt is only scheduled for 6 weeks)    Authorization - Visit Number 8    Authorization - Number of Visits 10    OT Start Time 1320    OT Stop Time 1400    OT Time Calculation (min) 40 min           Past Medical History:  Diagnosis Date   BPH (benign prostatic hyperplasia)    Foley catheter in place    removed after turp   GERD (gastroesophageal reflux disease)    Nocturnal leg cramps    occ   Urinary retention    Wears glasses     Past Surgical History:  Procedure Laterality Date   CATARACT EXTRACTION W/ INTRAOCULAR LENS  IMPLANT, BILATERAL Bilateral    INGUINAL HERNIA REPAIR Bilateral RIGHT x 2 1749 umbilical done with last right   left last one done 10 yrs ago   RE-DO RIGHT INGUINAL HERNIA REPAIR AND UMBILICAL HERNIA REPAIR  2001   REMOVAL LEFT EYE INTRAOCULAR LENS AND REPLACEMENT   08-29-2013   TRANSURETHRAL RESECTION OF PROSTATE N/A 09/23/2013   Procedure: TRANSURETHRAL RESECTION OF THE PROSTATE (TURP) WITH GYRUS;  Surgeon: Claybon Jabs, MD;  Location: [facility];  Service: Urology;  Laterality: N/A;   VARICOCELECTOMY Left 07/02/2018   Procedure: VARICOCELECTOMY;  Surgeon: Kathie Rhodes, MD;  Location: [facility];  Service: Urology;  Laterality: Left;    There were no vitals filed for this visit.   Subjective Assessment - 07/06/20 1400    Subjective  denies pain    Pertinent History PD    Patient Stated Goals to fasten buttons and write  better    Currently in Pain? No/denies                                OT Treatment/ Education - 07/06/20 1400    Education Details PWR! moves basic 4 supine x 10 reps each,   min v.c and demonstration, reveiwed activities from fine motor HEP(stacking and manipulating coins, rotating ball, tossing ball), rotating 2 golf balls in hand-min v.c for larger amplitude movements    Person(s) Educated Patient    Methods Explanation;Demonstration;Verbal cues    Comprehension Verbalized understanding;Returned demonstration;Verbal cues required            OT Short Term Goals - 07/06/20 1357      OT SHORT TERM GOAL #1   Title Pt will verbalize understanding of adapted strategies to maximize safety and I with ADLs/ IADLs .    Time 4    Period Weeks    Status On-going      OT SHORT TERM GOAL #2   Title I with PD specific HEP    Time 4    Period Weeks    Status Achieved      OT SHORT TERM GOAL #3   Title Pt will demonstrate improved fine motor coordination for ADLs as evidenced  by decreasing 9 hole peg test score for LUE by 3 secs    Time 4    Period Weeks    Status On-going   31.09     OT SHORT TERM GOAL #4   Title Pt will demonstrate increased ease with dressing as eveidnced by decreasing PPT#4(don/ doff jacket) to 17 secs or less    Time 4    Period Weeks    Status Achieved   16.25     OT SHORT TERM GOAL #5   Title Pt will demonstrate improved ease with dressing as eveidenced by decreasing 3 button/ unbutton time to 35 secs or less    Time 4    Period Weeks    Status Achieved   16.31            OT Long Term Goals - 07/06/20 1358      OT LONG TERM GOAL #1   Title Pt will demonstrate understanding of memory compensations and ways to keep thinking skills sharp    Time 8    Period Weeks    Status On-going      OT LONG TERM GOAL #2   Title Pt will write a short paragraph with 100% legibility and no significant decrease in letter size    Time 8     Period Weeks    Status On-going      OT LONG TERM GOAL #3   Title Pt will verbalize understanding of ways to prevent future PD related complications and PD community resources.    Time 8    Period Weeks    Status On-going      OT LONG TERM GOAL #4   Title Pt will demonstrate ability to retrieve a lightweight object from overhead shelf with RUE with elbow extension -10 or less.    Time 8    Period Weeks    Status On-going                 Plan - 07/06/20 1355    Clinical Impression Statement Pt is progressing towards goals with improving carryover of larger amplitude movements with functional activity.    OT Occupational Profile and History Detailed Assessment- Review of Records and additional review of physical, cognitive, psychosocial history related to current functional performance    Occupational performance deficits (Please refer to evaluation for details): ADL's;IADL's;Work;Play;Leisure;Social Participation    Body Structure / Function / Physical Skills ADL;UE functional use;Flexibility;FMC;ROM;GMC;Coordination;IADL;Dexterity;Decreased knowledge of use of DME;Strength;Mobility;Endurance;Balance;Gait    Cognitive Skills Memory    Rehab Potential Good    Clinical Decision Making Limited treatment options, no task modification necessary    Comorbidities Affecting Occupational Performance: May have comorbidities impacting occupational performance    Modification or Assistance to Complete Evaluation  No modification of tasks or assist necessary to complete eval    OT Frequency 2x / week   plus eval, may d/c after 6 weeks   OT Duration 8 weeks    OT Treatment/Interventions Self-care/ADL training;Ultrasound;DME and/or AE instruction;Patient/family education;Balance training;Passive range of motion;Gait Training;Cryotherapy;Fluidtherapy;Functional Mobility Training;Moist Heat;Therapeutic exercise;Manual Therapy;Cognitive remediation/compensation;Neuromuscular education    Plan Pt is  scheduled for 2 additional visits, anticipate d/c at that time, memory compensations/ keeping thinking skills sharp    Consulted and Agree with Plan of Care Patient           Patient will benefit from skilled therapeutic intervention in order to improve the following deficits and impairments:   Body Structure / Function / Physical Skills: ADL, UE  functional use, Flexibility, FMC, ROM, GMC, Coordination, IADL, Dexterity, Decreased knowledge of use of DME, Strength, Mobility, Endurance, Balance, Gait Cognitive Skills: Memory     Visit Diagnosis: Other symptoms and signs involving the nervous system  Other lack of coordination  Abnormal posture  Stiffness of right elbow, not elsewhere classified  Stiffness of left elbow, not elsewhere classified    Problem List Patient Active Problem List   Diagnosis Date Noted   BPH (benign prostatic hypertrophy) with urinary retention 09/23/2013    Linley Moskal 07/06/2020, 4:11 PM  [facility] [address] [place], [place], [address] Phone: [phone]   Fax:  [phone]  Name: Joshua Browning MRN: [id number] Date of Birth: 1946/01/24

## 2020-07-06 NOTE — Patient Instructions (Addendum)
   Keep reading aloud 5 minutes a day focusing on volume  Keep doing Hey's and Ah's!  Have your wife give you a non verbal signal (cup hand behind ear) rather than verbal interruption  Signs of Aspiration Pneumonia   . Chest pain/tightness . Fever (can be low grade) . Cough  o With foul-smelling phlegm (sputum) o With sputum containing pus or blood o With greenish sputum . Fatigue  . Shortness of breath  . Wheezing   **IF YOU HAVE THESE SIGNS, CONTACT YOUR DOCTOR OR GO TO THE EMERGENCY DEPARTMENT OR URGENT CARE AS SOON AS POSSIBLE**

## 2020-07-21 ENCOUNTER — Ambulatory Visit: Payer: Medicare Other | Attending: Family Medicine | Admitting: Occupational Therapy

## 2020-07-21 ENCOUNTER — Other Ambulatory Visit: Payer: Self-pay

## 2020-07-21 DIAGNOSIS — R29818 Other symptoms and signs involving the nervous system: Secondary | ICD-10-CM | POA: Insufficient documentation

## 2020-07-21 DIAGNOSIS — R293 Abnormal posture: Secondary | ICD-10-CM | POA: Diagnosis present

## 2020-07-21 DIAGNOSIS — M25622 Stiffness of left elbow, not elsewhere classified: Secondary | ICD-10-CM | POA: Diagnosis present

## 2020-07-21 DIAGNOSIS — R278 Other lack of coordination: Secondary | ICD-10-CM | POA: Insufficient documentation

## 2020-07-21 DIAGNOSIS — M25621 Stiffness of right elbow, not elsewhere classified: Secondary | ICD-10-CM | POA: Diagnosis present

## 2020-07-21 NOTE — Therapy (Signed)
[facility] [address] [place], [place], [address] Phone: [phone]   Fax:  [phone]  Occupational Therapy Treatment  Patient Details  Name: Joshua Browning MRN: [id number] Date of Birth: [birth year] Referring Provider (OT): Dr. Carles Collet   Encounter Date: 07/21/2020   OT End of Session - 07/21/20 1204    Visit Number 9    Number of Visits 17    Date for OT Re-Evaluation 07/28/20    Authorization - Visit Number 9    Authorization - Number of Visits 10    OT Start Time 0160    OT Stop Time 1145    OT Time Calculation (min) 40 min    Activity Tolerance Patient tolerated treatment well    Behavior During Therapy [facility] for tasks assessed/performed           Past Medical History:  Diagnosis Date  . BPH (benign prostatic hyperplasia)   . Foley catheter in place    removed after turp  . GERD (gastroesophageal reflux disease)   . Nocturnal leg cramps    occ  . Urinary retention   . Wears glasses     Past Surgical History:  Procedure Laterality Date  . CATARACT EXTRACTION W/ INTRAOCULAR LENS  IMPLANT, BILATERAL Bilateral   . INGUINAL HERNIA REPAIR Bilateral RIGHT x 2 1093 umbilical done with last right   left last one done 10 yrs ago  . RE-DO RIGHT INGUINAL HERNIA REPAIR AND UMBILICAL HERNIA REPAIR  2001  . REMOVAL LEFT EYE INTRAOCULAR LENS AND REPLACEMENT   08-29-2013  . TRANSURETHRAL RESECTION OF PROSTATE N/A 09/23/2013   Procedure: TRANSURETHRAL RESECTION OF THE PROSTATE (TURP) WITH GYRUS;  Surgeon: Claybon Jabs, MD;  Location: [facility];  Service: Urology;  Laterality: N/A;  . VARICOCELECTOMY Left 07/02/2018   Procedure: VARICOCELECTOMY;  Surgeon: Kathie Rhodes, MD;  Location: [facility];  Service: Urology;  Laterality: Left;    There were no vitals filed for this visit.   Subjective Assessment - 07/21/20 1132    Subjective  denies pain, requests d/c next visit    Pertinent  History PD    Patient Stated Goals to fasten buttons and write better    Currently in Pain? No/denies                                OT Education - 07/21/20 1251    Education Details PWR! moves basic 4 supine x 10 reps each,  min v.c initlly then pt returned demonstration, education provided regarding keeping thinking skills sharp, memory compensationns, ways to prevent future complications and community resources.    Person(s) Educated Patient    Methods Explanation;Demonstration;Verbal cues;Handout    Comprehension Verbalized understanding;Returned demonstration;Verbal cues required            OT Short Term Goals - 07/06/20 1357      OT SHORT TERM GOAL #1   Title Pt will verbalize understanding of adapted strategies to maximize safety and I with ADLs/ IADLs .    Time 4    Period Weeks    Status On-going      OT SHORT TERM GOAL #2   Title I with PD specific HEP    Time 4    Period Weeks    Status Achieved      OT SHORT TERM GOAL #3   Title Pt will demonstrate improved fine motor coordination for  ADLs as evidenced by decreasing 9 hole peg test score for LUE by 3 secs    Time 4    Period Weeks    Status On-going   31.09     OT SHORT TERM GOAL #4   Title Pt will demonstrate increased ease with dressing as eveidnced by decreasing PPT#4(don/ doff jacket) to 17 secs or less    Time 4    Period Weeks    Status Achieved   16.25     OT SHORT TERM GOAL #5   Title Pt will demonstrate improved ease with dressing as eveidenced by decreasing 3 button/ unbutton time to 35 secs or less    Time 4    Period Weeks    Status Achieved   16.31            OT Long Term Goals - 07/21/20 1253      OT LONG TERM GOAL #1   Title Pt will demonstrate understanding of memory compensations and ways to keep thinking skills sharp    Time 8    Period Weeks    Status Achieved      OT LONG TERM GOAL #2   Title Pt will write a short paragraph with 100% legibility and no  significant decrease in letter size    Time 8    Period Weeks    Status On-going      OT LONG TERM GOAL #3   Title Pt will verbalize understanding of ways to prevent future PD related complications and PD community resources.    Time 8    Period Weeks    Status Achieved      OT LONG TERM GOAL #4   Title Pt will demonstrate ability to retrieve a lightweight object from overhead shelf with RUE with elbow extension -10 or less.    Time 8    Period Weeks    Status On-going                 Plan - 07/21/20 1250    Clinical Impression Statement Pt demonstrates good overall progress towards goals. Pt demonstrates understanding of supine PWR! exercises.    OT Occupational Profile and History Detailed Assessment- Review of Records and additional review of physical, cognitive, psychosocial history related to current functional performance    Occupational performance deficits (Please refer to evaluation for details): ADL's;IADL's;Work;Play;Leisure;Social Participation    Body Structure / Function / Physical Skills ADL;UE functional use;Flexibility;FMC;ROM;GMC;Coordination;IADL;Dexterity;Decreased knowledge of use of DME;Strength;Mobility;Endurance;Balance;Gait    Cognitive Skills Memory    Rehab Potential Good    Clinical Decision Making Limited treatment options, no task modification necessary    Comorbidities Affecting Occupational Performance: May have comorbidities impacting occupational performance    Modification or Assistance to Complete Evaluation  No modification of tasks or assist necessary to complete eval    OT Frequency 2x / week   plus eval, may d/c after 6 weeks   OT Duration 8 weeks    OT Treatment/Interventions Self-care/ADL training;Ultrasound;DME and/or AE instruction;Patient/family education;Balance training;Passive range of motion;Gait Training;Cryotherapy;Fluidtherapy;Functional Mobility Training;Moist Heat;Therapeutic exercise;Manual Therapy;Cognitive  remediation/compensation;Neuromuscular education    Plan d/c next visit    Consulted and Agree with Plan of Care Patient           Patient will benefit from skilled therapeutic intervention in order to improve the following deficits and impairments:   Body Structure / Function / Physical Skills: ADL, UE functional use, Flexibility, FMC, ROM, GMC, Coordination, IADL, Dexterity, Decreased knowledge of use  of DME, Strength, Mobility, Endurance, Balance, Gait Cognitive Skills: Memory     Visit Diagnosis: Other symptoms and signs involving the nervous system  Other lack of coordination  Stiffness of right elbow, not elsewhere classified  Abnormal posture  Stiffness of left elbow, not elsewhere classified    Problem List Patient Active Problem List   Diagnosis Date Noted  . BPH (benign prostatic hypertrophy) with urinary retention 09/23/2013    Joshua Browning 07/21/2020, 12:54 PM Theone Murdoch, OTR/L [phone] Phone: [phone] 12:54 PM 07/21/20 [facility] [address] [place], [place], [address] Phone: [phone]   Fax:  [phone]  Name: Joshua Browning MRN: [id number] Date of Birth: [birth year]

## 2020-07-21 NOTE — Patient Instructions (Addendum)
Memory Compensation Strategies  1. Use "WARM" strategy.  W= write it down  A= associate it  R= repeat it  M= make a mental note  2.   You can keep a Social worker.  Use a 3-ring notebook with sections for the following: calendar, important names and phone numbers,  medications, doctors' names/phone numbers, lists/reminders, and a section to journal what you did  each day.   3.    Use a calendar to write appointments down.  4.    Write yourself a schedule for the day.  This can be placed on the calendar or in a separate section of the Memory Notebook.  Keeping a  regular schedule can help memory.  5.    Use medication organizer with sections for each day or morning/evening pills.  You may need help loading it  6.    Keep a basket, or pegboard by the door.  Place items that you need to take out with you in the basket or on the pegboard.  You may also want to  include a message board for reminders.  7.    Use sticky notes.  Place sticky notes with reminders in a place where the task is performed.  For example: " turn off the  stove" placed by the stove, "lock the door" placed on the door at eye level, " take your medications" on  the bathroom mirror or by the place where you normally take your medications.  8.    Use alarms/timers.  Use while cooking to remind yourself to check on food or as a reminder to take your medicine, or as a  reminder to make a call, or as a reminder to perform another task, etc.   Keeping Thinking Skills Sharp: 1. Jigsaw puzzles 2. Card/board games 3. Talking on the phone/social events 4. Lum[employer]com 5. Online games 6. Word searches/crossword puzzles 7.  Logic puzzles 8. Aerobic exercise (stationary bike) 9. Eating balanced diet (fruits & veggies) 10. Drink water 11. Try something new--new recipe, hobby 12. Crafts 13. Do a variety of activities that are challenging 14. Add cognitive activities to walking/exercising (think of animal/food/city with  each letter of the alphabet, counting backwards, thinking of as many vegetables as you can, etc.).--Only do this  If safe (no freezing/falls).       Ways to prevent future Parkinson's related complications: 1.   Exercise regularly,  2.   Focus on BIGGER movements during daily activities- really reach overhead, straighten elbows and extend fingers 3.   When dressing or reaching for your seatbelt make sure to use your body to assist by twisting while you reach-this can help to minimize stress on the shoulder and reduce the risk of a rotator cuff tear 4.   Swing your arms when you walk! People with PD are at increased risk for frozen shoulder and swinging your arms can reduce this risk.

## 2020-07-28 ENCOUNTER — Encounter: Payer: Self-pay | Admitting: Occupational Therapy

## 2020-07-28 ENCOUNTER — Other Ambulatory Visit: Payer: Self-pay

## 2020-07-28 ENCOUNTER — Ambulatory Visit: Payer: Medicare Other | Admitting: Occupational Therapy

## 2020-07-28 DIAGNOSIS — M25621 Stiffness of right elbow, not elsewhere classified: Secondary | ICD-10-CM

## 2020-07-28 DIAGNOSIS — R29818 Other symptoms and signs involving the nervous system: Secondary | ICD-10-CM

## 2020-07-28 DIAGNOSIS — R293 Abnormal posture: Secondary | ICD-10-CM

## 2020-07-28 DIAGNOSIS — M25622 Stiffness of left elbow, not elsewhere classified: Secondary | ICD-10-CM

## 2020-07-28 DIAGNOSIS — R278 Other lack of coordination: Secondary | ICD-10-CM

## 2020-07-28 NOTE — Therapy (Addendum)
[facility] [address] [place], [place], [address] Phone: [phone]   Fax:  [phone]  Occupational Therapy Treatment  Patient Details  Name: Joshua Browning MRN: [id number] Date of Birth: 02/12/1946 Referring Provider (OT): Dr. Carles Collet   Encounter Date: 07/28/2020   OT End of Session - 07/28/20 1500    Visit Number 10    Number of Visits 17    Date for OT Re-Evaluation 07/28/20    Authorization Type [employer]    Authorization Time Period 12 weeks (pt is only scheduled for 6 weeks)    Authorization - Visit Number 10    Authorization - Number of Visits 10    OT Start Time 1450    OT Stop Time 1530    OT Time Calculation (min) 40 min    Activity Tolerance Patient tolerated treatment well    Behavior During Therapy [facility] for tasks assessed/performed           Past Medical History:  Diagnosis Date  . BPH (benign prostatic hyperplasia)   . Foley catheter in place    removed after turp  . GERD (gastroesophageal reflux disease)   . Nocturnal leg cramps    occ  . Urinary retention   . Wears glasses     Past Surgical History:  Procedure Laterality Date  . CATARACT EXTRACTION W/ INTRAOCULAR LENS  IMPLANT, BILATERAL Bilateral   . INGUINAL HERNIA REPAIR Bilateral RIGHT x 2 7494 umbilical done with last right   left last one done 10 yrs ago  . RE-DO RIGHT INGUINAL HERNIA REPAIR AND UMBILICAL HERNIA REPAIR  2001  . REMOVAL LEFT EYE INTRAOCULAR LENS AND REPLACEMENT   08-29-2013  . TRANSURETHRAL RESECTION OF PROSTATE N/A 09/23/2013   Procedure: TRANSURETHRAL RESECTION OF THE PROSTATE (TURP) WITH GYRUS;  Surgeon: Claybon Jabs, MD;  Location: [facility];  Service: Urology;  Laterality: N/A;  . VARICOCELECTOMY Left 07/02/2018   Procedure: VARICOCELECTOMY;  Surgeon: Kathie Rhodes, MD;  Location: [facility];  Service: Urology;  Laterality: Left;    There were no vitals filed for this  visit.   Subjective Assessment - 07/28/20 1459    Subjective  Pt reports he is tired    Pertinent History PD    Patient Stated Goals to fasten buttons and write better    Currently in Pain? No/denies              [facility] OT Assessment - 07/28/20 0001      Coordination   Left 9 Hole Peg Test 32.06                            OT Education - 07/28/20 1502    Education Details Reviewed coordination HEP, checked progress towards goals and discussed with patient, discussed plans for PD screen in 6 mons    Person(s) Educated Patient    Methods Explanation;Demonstration    Comprehension Verbalized understanding;Returned demonstration;Verbal cues required            OT Short Term Goals - 07/28/20 1502      OT SHORT TERM GOAL #1   Title Pt will verbalize understanding of adapted strategies to maximize safety and I with ADLs/ IADLs .    Time 4    Period Weeks    Status Achieved      OT SHORT TERM GOAL #2   Title I with PD specific HEP  Time 4    Period Weeks    Status Achieved      OT SHORT TERM GOAL #3   Title Pt will demonstrate improved fine motor coordination for ADLs as evidenced by decreasing 9 hole peg test score for LUE by 3 secs    Time 4    Period Weeks    Status Not Met   32.06     OT SHORT TERM GOAL #4   Title Pt will demonstrate increased ease with dressing as eveidnced by decreasing PPT#4(don/ doff jacket) to 17 secs or less    Time 4    Period Weeks    Status Achieved   16.25     OT SHORT TERM GOAL #5   Title Pt will demonstrate improved ease with dressing as eveidenced by decreasing 3 button/ unbutton time to 35 secs or less    Time 4    Period Weeks    Status Achieved   16.31            OT Long Term Goals - 07/28/20 1525      OT LONG TERM GOAL #1   Title Pt will demonstrate understanding of memory compensations and ways to keep thinking skills sharp    Time 8    Period Weeks    Status Achieved      OT LONG TERM GOAL #2    Title Pt will write a short paragraph with 100% legibility and no significant decrease in letter size    Time 8    Period Weeks    Status Achieved      OT LONG TERM GOAL #3   Title Pt will verbalize understanding of ways to prevent future PD related complications and PD community resources.    Time 8    Period Weeks    Status Achieved      OT LONG TERM GOAL #4   Title Pt will demonstrate ability to retrieve a lightweight object from overhead shelf with RUE with elbow extension -10 or less.    Time 8    Period Weeks    Status Achieved   -10                Plan - 07/28/20 1617    Clinical Impression Statement For the reporting period of 10/19-12/14/21, pt demonstrates good overall progress. Pt agrees with plans for d/c today and plans to return for a screen in 6 months. See goals for overall progress.    OT Occupational Profile and History Detailed Assessment- Review of Records and additional review of physical, cognitive, psychosocial history related to current functional performance    Occupational performance deficits (Please refer to evaluation for details): ADL's;IADL's;Work;Play;Leisure;Social Participation    Body Structure / Function / Physical Skills ADL;UE functional use;Flexibility;FMC;ROM;GMC;Coordination;IADL;Dexterity;Decreased knowledge of use of DME;Strength;Mobility;Endurance;Balance;Gait    Cognitive Skills Memory    Rehab Potential Good    Clinical Decision Making Limited treatment options, no task modification necessary    Comorbidities Affecting Occupational Performance: May have comorbidities impacting occupational performance    Modification or Assistance to Complete Evaluation  No modification of tasks or assist necessary to complete eval    OT Frequency 2x / week    OT Duration 8 weeks    OT Treatment/Interventions Self-care/ADL training;Ultrasound;DME and/or AE instruction;Patient/family education;Balance training;Passive range of motion;Gait  Training;Cryotherapy;Fluidtherapy;Functional Mobility Training;Moist Heat;Therapeutic exercise;Manual Therapy;Cognitive remediation/compensation;Neuromuscular education    Plan d/c OT, return screen in 6 months    Consulted and Agree with Plan of Care  Patient           Patient will benefit from skilled therapeutic intervention in order to improve the following deficits and impairments:   Body Structure / Function / Physical Skills: ADL,UE functional use,Flexibility,FMC,ROM,GMC,Coordination,IADL,Dexterity,Decreased knowledge of use of DME,Strength,Mobility,Endurance,Balance,Gait Cognitive Skills: Memory     Visit Diagnosis: Other symptoms and signs involving the nervous system  Other lack of coordination  Stiffness of right elbow, not elsewhere classified  Abnormal posture  Stiffness of left elbow, not elsewhere classified   OCCUPATIONAL THERAPY DISCHARGE SUMMARY   Current functional level related to goals / functional outcomes: Pt made good overall progress, see goals.   Remaining deficits: Bradykinesia, decreased balance, decreased functional mobility, decreased coordination   Education / Equipment: Pt was educated regarding:HEP, adapted strategies for ADLs, and memory compensations, community resources.Pt verbalized understanding of all education. Plan: Patient agrees to discharge.  Patient goals were partially met. Patient is being discharged due to meeting the stated rehab goals.  ?????     Problem List Patient Active Problem List   Diagnosis Date Noted  . BPH (benign prostatic hypertrophy) with urinary retention 09/23/2013    Clotilda Hafer 07/28/2020, 4:21 PM Theone Murdoch, OTR/L [phone] Phone: [phone] 4:21 PM 07/28/20 [facility] [address] [place], [place], [address] Phone: [phone]   Fax:  [phone]  Name: Joshua Browning MRN: [id number] Date of Birth: [birth year]

## 2020-08-04 ENCOUNTER — Encounter: Payer: Medicare Other | Admitting: Occupational Therapy

## 2020-08-11 ENCOUNTER — Encounter: Payer: Medicare Other | Admitting: Occupational Therapy

## 2020-08-13 NOTE — Progress Notes (Signed)
Assessment/Plan:   1.  Parkinsons Disease   -Continue carbidopa/levodopa 25/100, 1 tablet 3 times per day  -Continue carbidopa/levodopa 50/200 at bedtime  -reassured patient that he is doing well  -discussed adding in exercise  -Continue clonazepam 0.5 mg, half tablet at bedtime for cramping and sleep.  2.  MCI  -Last neurocognitive testing was in 2017 with Dr. Alinda Dooms.  I have not seen any significant change in him cognitively since that time.  3.  Mild depression/insomnia  -Doing well with mirtazapine, 15 mg at bedtime  4.  B12 deficiency  -On oral supplementation   Subjective:   Joshua Browning was seen today in follow up for Parkinsons disease.  My previous records were reviewed prior to todays visit as well as outside records available to me. Pt denies falls.  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  He got home and the day he got home his wife fell down the stairs and had a compression fx.  Pt denies lightheadedness, near syncope.  No hallucinations.  Mood has been good.  Notes from physical, occupational, speech therapy have been reviewed.  Current prescribed movement disorder medications: Carbidopa/levodopa 25/100, 1 tablet 3 times per day Carbidopa/levodopa 50/200 at bedtime Mirtazapine, 15 mg at bedtime Clonazepam 0.5 mg, half a tablet at bedtime  B12 supplement    ALLERGIES:  No Known Allergies  CURRENT MEDICATIONS:  Outpatient Encounter Medications as of 08/18/2020  Medication Sig  . b complex vitamins tablet Take 1 tablet by mouth daily.  . carbidopa-levodopa (SINEMET CR) 50-200 MG tablet TAKE 1 TABLET BY MOUTH EVERYDAY AT BEDTIME  . carbidopa-levodopa (SINEMET IR) 25-100 MG tablet TAKE 1 TABLET BY MOUTH THREE TIMES A DAY  . Cholecalciferol (VITAMIN D3) 1000 UNITS CAPS Take 1 capsule by mouth every evening.  . clonazePAM (KLONOPIN) 0.5 MG tablet TAKE 1/2 TABLET BY MOUTH AT BEDTIME  . famotidine  (PEPCID) 20 MG tablet Take 20 mg by mouth at bedtime. Takes pepcid ac  . Glucosamine Sulfate 1000 MG CAPS Take by mouth daily.   . Magnesium 100 MG CAPS Take 1 capsule by mouth every evening.  . mirtazapine (REMERON) 15 MG tablet TAKE 1 TABLET BY MOUTH EVERYDAY AT BEDTIME  . Multiple Vitamins-Minerals (VITABASIC SENIOR PO) Take 1 tablet by mouth.  . Potassium 99 MG TABS Take by mouth daily.   No facility-administered encounter medications on file as of 08/18/2020.    Objective:   PHYSICAL EXAMINATION:    VITALS:   Vitals:   08/18/20 1300  BP: 108/68  Pulse: 82  SpO2: 98%  Weight: 160 lb (72.6 kg)  Height: 5\' 10"  (1.778 m)    GEN:  The patient appears stated age and is in NAD. HEENT:  Normocephalic, atraumatic.  The mucous membranes are moist. The superficial temporal arteries are without ropiness or tenderness. CV:  RRR Lungs:  CTAB Neck/HEME:  There are no carotid bruits bilaterally.  Neurological examination:  Orientation: The patient is alert and oriented x3. Cranial nerves: There is good facial symmetry with minimal facial hypomimia. The speech is fluent and clear. Soft palate rises symmetrically and there is no tongue deviation. Hearing is intact to conversational tone. Sensation: Sensation is intact to light touch throughout Motor: Strength is at least antigravity x4.  Movement examination: Tone: There is nl tone in the UE/LE Abnormal movements: mildly tremulous in the L>R Coordination:  There is no decremation with RAM's,  with any form of RAMS, including alternating supination and pronation of the forearm, hand opening and closing, finger taps, heel taps and toe taps. Gait and Station: The patient has minimal difficulty arising out of a deep-seated chair without the use of the hands (puts nose over toes).  He is forward flexed. The patient's stride length is slightly decreased.    I have reviewed and interpreted the following labs independently    Chemistry       Component Value Date/Time   NA 145 06/25/2019 1049   K 5.0 06/25/2019 1049   CL 109 06/25/2019 1049   CO2 27 06/25/2019 1049   BUN 16 06/25/2019 1049   CREATININE 0.86 06/25/2019 1049      Component Value Date/Time   CALCIUM 9.3 06/25/2019 1049   ALKPHOS 50 11/30/2015 1023   AST 21 06/25/2019 1049   ALT 16 06/25/2019 1049   BILITOT 0.5 06/25/2019 1049       Lab Results  Component Value Date   HGB 13.1 09/24/2013   HCT 38.4 (L) 09/24/2013    Lab Results  Component Value Date   TSH 1.49 06/25/2019     Total time spent on today's visit was 25 minutes, including both face-to-face time and nonface-to-face time.  Time included that spent on review of records (prior notes available to me/labs/imaging if pertinent), discussing treatment and goals, answering patient's questions and coordinating care.  Cc:  Lawerance Cruel, MD

## 2020-08-18 ENCOUNTER — Other Ambulatory Visit: Payer: Self-pay

## 2020-08-18 ENCOUNTER — Encounter: Payer: Self-pay | Admitting: Neurology

## 2020-08-18 ENCOUNTER — Ambulatory Visit: Payer: Medicare Other | Admitting: Neurology

## 2020-08-18 VITALS — BP 108/68 | HR 82 | Ht 70.0 in | Wt 160.0 lb

## 2020-08-18 DIAGNOSIS — G20 Parkinson's disease: Secondary | ICD-10-CM

## 2020-08-18 NOTE — Patient Instructions (Signed)
Parkinsons Community Resources   . Online Resources for Power over Parkinson's Group . Local [place] Online Groups  o Power over Parkinson's Group :   - Power Over Parkinson's Patient Education Group will be Wednesday, December 8th at 2pm via Zoom.   - Upcoming Power over Parkinson's Meetings:  2nd Wednesdays of the month at 2 pm:       January 12th, February 9th - Amy Marriott, PT at [facility] has resumed the lead of this group starting in July.  Contact Amy at [email or web address] if interested in participating in this online group o Parkinson's Care Partners Group:    3rd Mondays, Contact Sarah Chambers o Atypical Parkinsonian Patient Group:   4th Wednesdays, Contact Sarah Chambers o If you are interested in participating in these online groups with Sarah, please contact her directly for how to join those meetings.  Her contact information is [email or web address].  She will send you a link to join the Zoom meeting.  (Please note that Sarah Chambers , MSW, LCSW, has resigned her position at [facility], but will continue to lead the online groups temporarily) .  . Parkinson Foundation:  www.parkinson.org o PD Health at Home continues:  Mindfulness Mondays, Expert Briefing Tuesdays, Wellness Wednesdays, Take Time Thursdays, Fitness Fridays  o Upcoming Webinar:  (Next one is February 2022, stay tuned) o Please check out their website to sign up for emails and see their full online offerings .  . Michael J Fox Foundation:  www.michaeljfox.org  o Upcoming Webinar:   Stay tuned for 2022 o Check out additional information on their website to see their full online offerings .  . Davis Phinney Foundation:  www.davisphinneyfoundation.org o Upcoming Webinar:  Stay tuned for 2022 o Care Partner Monthly Meetup.  With Connie Carpenter Phinney.  First Tuesday of each month, 2 pm o Check out additional information to Live Well Today on their  website .  . Parkinson and Movement Disorders (PMD) Alliance:  www.pmdalliance.org o NeuroLife Online:  Online Education Events o Sign up for emails, which are sent weekly to give you updates on programming and online offerings .  . Parkinson's Association of the [place]:  www.parkinsonassociation.org o Information on online support groups, online exercises including Yoga, Parkinson's exercises and more-LOTS of information on links to PD resources and online events o Virtual Support Group through Parkinson's Association of the [place]; next one is scheduled for Wednesday, July 15, 2020 at 2 pm. (These are typically scheduled for the 1st Wednesday of the month at 2 pm).  Visit website for details. .  . Additional links for movement activities: o PWR! Moves Classes at [facility] HAVE RESUMED, at a limited capacity.  We have several openings for Wednesday 10 am and 11 am classes.  Contact Amy Marriott, PT [email or web address] or [phone] if interested o Here is a link to the PWR!Moves classes on Zoom from [place] Parkinson's Foundation - Daily Mon-Sat at 10:00. Via Zoom, FREE and open to all.  There is also a link below via Facebook if you use that platform. - https://www.parkinsonsmi.org/mpf-programs/exercise-and-movement-activities - https://www.facebook.com/ParkinsonsMI.org/posts/pwr-moves-exercise-class-parkinson-wellness-recovery-online-with-angee-ludwa-pt-/10156827878021813/ o Parkinson's Wellness Recovery (PWR! Moves)  www.pwr4life.org - Info on the PWR! Virtual Experience:  You will have access to our expertise through self-assessment, guided plans that start with the PD-specific fundamentals, educational content, tips, Q&A with an expert, and a growing library of PD-specific pre-recorded and live exercise classes of varying types and intensity - both physical and cognitive! If that is not enough, we offer 1:1 wellness   consultations (in-person or virtual) to  personalize your PWR! Dance movement psychotherapist.  - Check out the PWR! Move of the month on the Parkinson Wellness Recovery website:  SearchPrisoners.de o [employer]  Fridays:  - As part of the PD Health @ Home program, this free video series focuses each week on one aspect of fitness designed to support people living with Parkinson's.  -  http://www.morris.com/ o Dance for PD website is offering free, live-stream classes throughout the week, as well as links to [employer] of classes:  https://danceforparkinsons.org/ o Hotel manager for Parkinson's Class:  Dance Project of Ginette Otto is back this Fall!  Free offering for people with Parkinson's and care partners; virtual class this Fall. The class will be Wednesdays 4-5pm beginning 10/13.  Classes will run for 9 weeks 10/13-12/15,.  Register below: o https://app.thestudiodirector.com/danceprojectinc/portal.sd?page=Enroll&meth=search&SEASON=Parkinsons+Dance-Fall+2021  o For more information, contact [phone] or email Allena Napoleon at [email or web address] o Virtual dance and Pilates for Parkinson's classes: Click on the Community Tab> Parkinson's Movement Initiative Tab.  To register for classes and for more information, visit www.[email or web address].co.za and click the "community" tab.  o [facility] Parkinson's Cycling Classes  - [facility] [employer]: 1pm on Fridays-Live classes at [facility] [employer] at [email or web address] or [phone]) [facility] [employer]: Virtual Classes Mondays and Thursdays (contact [place] at [email or web address] or [phone]) .  o [employer] - Three levels of classes are offered Tuesdays and Thursdays:  10:30 am,  12 noon & 1:45 pm at [employer]. To observe a class or for  more information, call [phone] or email  [email or web address] . Well-Spring Solutions: o Financial trader Opportunities:  www.well-springsolutions.org/caregiver-education/caregiver-support-group.  You may also contact Loleta Chance at [email or web address] or [phone].   o Caregiver Virtual Event:   Well-Spring is having a [phone], Thursday, December 9th from 6-7 pm - Contact [phone] (above) for details o Well-Spring Navigator:  Just1Navigator program, a free service to help individuals and families through the journey of determining care for older adults.  The "Navigator" is a Loleta Chance, Child psychotherapist, who will speak with a prospective client and/or loved ones to provide an assessment of the situation and a set of recommendations for a personalized care plan -- all free of charge, and whether Well-Spring Solutions offers the needed service or not. If the need is not a service we provide, we are well-connected with reputable programs in town that we can refer you to.  www.well-springsolutions.org or to speak with the Navigator, call [phone].

## 2020-08-21 ENCOUNTER — Encounter: Payer: Medicare Other | Admitting: Occupational Therapy

## 2020-08-24 ENCOUNTER — Other Ambulatory Visit: Payer: Self-pay | Admitting: Neurology

## 2020-08-24 NOTE — Telephone Encounter (Signed)
Rx(s) sent to pharmacy electronically.  

## 2020-08-25 ENCOUNTER — Encounter: Payer: Medicare Other | Admitting: Occupational Therapy

## 2020-08-25 ENCOUNTER — Other Ambulatory Visit: Payer: Self-pay | Admitting: Neurology

## 2020-09-22 ENCOUNTER — Other Ambulatory Visit: Payer: Self-pay | Admitting: Neurology

## 2020-10-23 DIAGNOSIS — G43109 Migraine with aura, not intractable, without status migrainosus: Secondary | ICD-10-CM | POA: Diagnosis not present

## 2020-10-23 DIAGNOSIS — H35352 Cystoid macular degeneration, left eye: Secondary | ICD-10-CM | POA: Diagnosis not present

## 2020-10-23 DIAGNOSIS — Z961 Presence of intraocular lens: Secondary | ICD-10-CM | POA: Diagnosis not present

## 2020-10-23 DIAGNOSIS — T8529XD Other mechanical complication of intraocular lens, subsequent encounter: Secondary | ICD-10-CM | POA: Diagnosis not present

## 2020-11-03 DIAGNOSIS — Z85828 Personal history of other malignant neoplasm of skin: Secondary | ICD-10-CM | POA: Diagnosis not present

## 2020-11-03 DIAGNOSIS — L814 Other melanin hyperpigmentation: Secondary | ICD-10-CM | POA: Diagnosis not present

## 2020-11-03 DIAGNOSIS — D1801 Hemangioma of skin and subcutaneous tissue: Secondary | ICD-10-CM | POA: Diagnosis not present

## 2020-11-03 DIAGNOSIS — L821 Other seborrheic keratosis: Secondary | ICD-10-CM | POA: Diagnosis not present

## 2020-11-03 DIAGNOSIS — L570 Actinic keratosis: Secondary | ICD-10-CM | POA: Diagnosis not present

## 2020-11-11 DIAGNOSIS — G43109 Migraine with aura, not intractable, without status migrainosus: Secondary | ICD-10-CM | POA: Diagnosis not present

## 2020-11-11 DIAGNOSIS — H35352 Cystoid macular degeneration, left eye: Secondary | ICD-10-CM | POA: Diagnosis not present

## 2020-11-11 DIAGNOSIS — Z961 Presence of intraocular lens: Secondary | ICD-10-CM | POA: Diagnosis not present

## 2020-11-11 DIAGNOSIS — T8529XD Other mechanical complication of intraocular lens, subsequent encounter: Secondary | ICD-10-CM | POA: Diagnosis not present

## 2020-11-22 ENCOUNTER — Other Ambulatory Visit: Payer: Self-pay | Admitting: Neurology

## 2020-11-26 DIAGNOSIS — H35352 Cystoid macular degeneration, left eye: Secondary | ICD-10-CM | POA: Diagnosis not present

## 2020-11-26 DIAGNOSIS — T8529XD Other mechanical complication of intraocular lens, subsequent encounter: Secondary | ICD-10-CM | POA: Diagnosis not present

## 2020-11-26 DIAGNOSIS — G43109 Migraine with aura, not intractable, without status migrainosus: Secondary | ICD-10-CM | POA: Diagnosis not present

## 2020-11-26 DIAGNOSIS — Z961 Presence of intraocular lens: Secondary | ICD-10-CM | POA: Diagnosis not present

## 2020-12-21 ENCOUNTER — Telehealth: Payer: Self-pay | Admitting: Neurology

## 2020-12-21 NOTE — Telephone Encounter (Signed)
New message    Pt c/o medication issue:  1. Name of Medication: carbidopa-levodopa (SINEMET IR) 25-100 MG tablet  2. How are you currently taking this medication (dosage and times per day)? TAKE 1 TABLET BY MOUTH THREE TIMES A DAY  3. Are you having a reaction (difficulty breathing--STAT)? No  4. What is your medication issue? The pharmacy only filled 180 pills - asking for a call back to discuss

## 2020-12-21 NOTE — Telephone Encounter (Signed)
Notified pt --will refill 3 months supply for the next appt with Dr. Carles Collet, since pt already pick-up 180 tab of carbidipa-levodopa 25-100 mg tab at the pharmacy .Pt mention  have enough medication until the next appt.

## 2021-01-15 NOTE — Progress Notes (Signed)
Assessment/Plan:   1.  Parkinsons Disease   -slightly increase carbidopa/levodopa 25/100, 1.5 tablets 3 times per day  -Continue carbidopa/levodopa 50/200 at bedtime  -Continue clonazepam 0.5 mg, half tablet at bedtime for cramping and sleep.  -refer for PT  -consider PWR Moves and emailed [employer] about that   2.  [facility]  -Last neurocognitive testing was in 2017 with Dr. Si Raider.  I have not seen any significant change in him cognitively since that time.  3.  Mild depression/insomnia  -Doing well with mirtazapine, 15 mg at bedtime  4.  B12 deficiency  -On oral supplementation  5.  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  Patient following with ophthalmology.  Subjective:   Joshua Browning was seen today in follow up for Parkinsons disease.  My previous records were reviewed prior to todays visit as well as outside records available to me. Pt denies falls.  No lightheadedness or near syncope.  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  Amt of diplopia varies on the lighting per pt today.  He is following with ophthalmology.  Notes are reviewed.  He is no longer working - 6 weeks and wife just retired.  Diplopia doesn't bother the driving.  Walking daily for exercise and wife notes L leg swings out with ambulation - pt thinks its when he is tired.  Cramping in the leg has improved.  No back pain  Current prescribed movement disorder medications: Carbidopa/levodopa 25/100, 1 tablet 3 times per day Carbidopa/levodopa 50/200 at bedtime Mirtazapine, 15 mg at bedtime Clonazepam 0.5 mg, half a tablet at bedtime  B12 supplement    ALLERGIES:  No Known Allergies  CURRENT MEDICATIONS:  Outpatient Encounter Medications as of 01/19/2021  Medication Sig  . b complex vitamins tablet Take 1 tablet by mouth daily.  . carbidopa-levodopa (SINEMET CR) 50-200 MG tablet  TAKE 1 TABLET BY MOUTH EVERYDAY AT BEDTIME  . carbidopa-levodopa (SINEMET IR) 25-100 MG tablet TAKE 1 TABLET BY MOUTH THREE TIMES A DAY  . Cholecalciferol (VITAMIN D3) 1000 UNITS CAPS Take 1 capsule by mouth every evening.  . clonazePAM (KLONOPIN) 0.5 MG tablet TAKE 1/2 TABLET BY MOUTH EVERY DAY AT BEDTIME  . famotidine (PEPCID) 20 MG tablet Take 20 mg by mouth at bedtime. Takes pepcid ac  . Glucosamine Sulfate 1000 MG CAPS Take by mouth daily.   . Magnesium 100 MG CAPS Take 1 capsule by mouth every evening.  . mirtazapine (REMERON) 15 MG tablet TAKE 1 TABLET BY MOUTH EVERYDAY AT BEDTIME  . Multiple Vitamins-Minerals (VITABASIC SENIOR PO) Take 1 tablet by mouth.  . Potassium 99 MG TABS Take by mouth daily.   No facility-administered encounter medications on file as of 01/19/2021.    Objective:   PHYSICAL EXAMINATION:    VITALS:   Vitals:   01/19/21 1300  BP: 130/72  Pulse: 66  SpO2: 98%  Weight: 156 lb (70.8 kg)  Height: 5\' 10"  (1.778 m)    GEN:  The patient appears stated age and is in NAD. HEENT:  Normocephalic, atraumatic.  The mucous membranes are moist.   Neurological examination:  Orientation: The patient is alert and oriented x3. Cranial nerves: There is good facial symmetry with minimal facial hypomimia. The speech is fluent and clear. Soft palate rises symmetrically and there is no tongue deviation. Hearing is intact to conversational tone. Sensation: Sensation is intact to light  touch throughout Motor: Strength is at least antigravity x4.  Movement examination: Tone: There is mild increased tone in the LUE Abnormal movements: no tremor today Coordination:  There is no decremation with RAM's, with any form of RAMS, including alternating supination and pronation of the forearm, hand opening and closing, finger taps, heel taps and toe taps. Gait and Station: The patient has minimal difficulty arising out of a deep-seated chair without the use of the hands (puts nose  over toes).  He is forward flexed. The patient's stride length is slightly decreased and he circumducts the L leg  I have reviewed and interpreted the following labs independently    Chemistry      Component Value Date/Time   NA 145 06/25/2019 1049   K 5.0 06/25/2019 1049   CL 109 06/25/2019 1049   CO2 27 06/25/2019 1049   BUN 16 06/25/2019 1049   CREATININE 0.86 06/25/2019 1049      Component Value Date/Time   CALCIUM 9.3 06/25/2019 1049   ALKPHOS 50 11/30/2015 1023   AST 21 06/25/2019 1049   ALT 16 06/25/2019 1049   BILITOT 0.5 06/25/2019 1049       Lab Results  Component Value Date   HGB 13.1 09/24/2013   HCT 38.4 (L) 09/24/2013    Lab Results  Component Value Date   TSH 1.49 06/25/2019     Total time spent on today's visit was 30 minutes, including both face-to-face time and nonface-to-face time.  Time included that spent on review of records (prior notes available to me/labs/imaging if pertinent), discussing treatment and goals, answering patient's questions and coordinating care.  Cc:  Lawerance Cruel, MD

## 2021-01-18 ENCOUNTER — Other Ambulatory Visit: Payer: Self-pay | Admitting: Neurology

## 2021-01-19 ENCOUNTER — Encounter: Payer: Self-pay | Admitting: Neurology

## 2021-01-19 ENCOUNTER — Ambulatory Visit: Payer: Medicare Other | Admitting: Neurology

## 2021-01-19 ENCOUNTER — Other Ambulatory Visit: Payer: Self-pay

## 2021-01-19 VITALS — BP 130/72 | HR 66 | Ht 70.0 in | Wt 156.0 lb

## 2021-01-19 DIAGNOSIS — Z13220 Encounter for screening for lipoid disorders: Secondary | ICD-10-CM | POA: Diagnosis not present

## 2021-01-19 DIAGNOSIS — G20 Parkinson's disease: Secondary | ICD-10-CM | POA: Diagnosis not present

## 2021-01-19 DIAGNOSIS — Z136 Encounter for screening for cardiovascular disorders: Secondary | ICD-10-CM | POA: Diagnosis not present

## 2021-01-19 DIAGNOSIS — Z131 Encounter for screening for diabetes mellitus: Secondary | ICD-10-CM | POA: Diagnosis not present

## 2021-01-19 MED ORDER — CARBIDOPA-LEVODOPA ER 50-200 MG PO TBCR: EXTENDED_RELEASE_TABLET | ORAL | 1 refills | Status: DC

## 2021-01-19 MED ORDER — CARBIDOPA-LEVODOPA 25-100 MG PO TABS: 1.5000 | ORAL_TABLET | Freq: Three times a day (TID) | ORAL | 1 refills | Status: DC

## 2021-01-19 MED ORDER — MIRTAZAPINE 15 MG PO TABS: 15.0000 mg | ORAL_TABLET | Freq: Every day | ORAL | 1 refills | Status: DC

## 2021-01-19 NOTE — Patient Instructions (Addendum)
1.  Take carbidopa/levodopa 25/100, 1.5 tablets at 8am/noon/4pm 2.  Continue carbidopa/levodopa 50/200 CR at bedtime 3.  Continue clonazepam 0.5 mg, 1/2 tablet at bedtime 4.  Continue mirtazapine 15 mg at bedtime  You have been referred to Neuro Rehab for therapy. They will call you directly to schedule an appointment.  Please call [phone] if you do not hear from them.    Online Resources for Power over Parkinson's Group May 2022  . Local [place] Online Groups  o Power over [employer] Group :    - Upcoming Power over [employer]:  2nd Wednesdays of the month at 2 pm:  June 8th, July 13th - Contact Amy Marriott at [email or web address] if interested in participating in this group o Parkinson's Care Partners Group:    3rd Mondays, Contact Misty Paladino o Atypical Parkinsonian Patient Group:   4th Wednesdays, Contact Misty Paladino o If you are interested in participating in these groups with Misty, please contact her directly for how to join those meetings.  Her contact information is [email or web address].   . [place]:  www.[email or web address].org o PD Health at Home continues:  Mindfulness Mondays, Expert Briefing Tuesdays, Wellness Wednesdays, Take Time Thursdays, Fitness Fridays o Register for Armed forces operational officer) at [email or web address] o  Please check out their website to sign up for emails and see their full online offerings  . Page:  www.[email or web address]  o Check out additional information on their website to see their full online offerings  . [place]:  www.[email or web address].org o Upcoming Webinar:  Stay tuned o Care Partner Monthly Meetup.  With [place] Phinney.  First Tuesday of each month, 2 pm o Joy Breaks:  First Wednesday of each month, 2-3 pm. There will be art, doodling, making, crafting, listening, laughing, stories, and everything in between. No art experience  necessary. No supplies required. Just show up for joy!  Register on their website. o Check out additional information to Live Well Today on their website  . Parkinson and Movement Disorders (PMD) Alliance:  www.pmdalliance.org o NeuroLife Online:  Online Education Events o Sign up for emails, which are sent weekly to give you updates on programming and online offerings     . Parkinson's Association of the [place]:  www.parkinsonassociation.org o Information on online support groups, education events, and online exercises including Yoga, Parkinson's exercises and more-LOTS of information on links to PD resources and online events o Virtual Support Group through Parkinson's Association of the [place]; next one is scheduled for Wednesday, May 4th, 2022 at 2 pm. (These are typically scheduled for the 1st Wednesday of the month at 2 pm).  Visit website for details.  . Additional links for movement activities: o PWR! Moves Classes at [place] RESUMED!  Wednesdays 10 and 11 am.  Contact Amy Marriott, PT [email or web address] or [phone] if interested o Here is a link to the PWR!Moves classes on Zoom from [phone] - Daily Mon-Sat at 10:00. Via Zoom, FREE and open to all.  There is also a link below via Facebook if you use that platform. - [place] - [email or web address] o Parkinson's Wellness Recovery (PWR! Moves)  www.pwr4life.org - Info on the PWR! Virtual Experience:  You will have access to our expertise through self-assessment, guided plans that start with the PD-specific fundamentals, educational content, tips, Q&A with an expert, and a growing PrepaidParty.no of PD-specific pre-recorded and live exercise classes of varying types and intensity - both  physical and cognitive! If that is not  enough, we offer 1:1 wellness consultations (in-person or virtual) to personalize your PWR! Research scientist (medical).  - Check out the PWR! Move of the month on the [place] Recovery website:  [email or web address].[email or web address]/ o [employer] Fridays:  - As part of the PD Health @ Home program, this free video series focuses each week on one aspect of fitness designed to support people living with Parkinson's.  These weekly videos highlight the [place] recent fitness guidelines for people with Parkinson's disease. -  HollywoodSale.dk o Dance for PD website is offering free, live-stream classes throughout the week, as well as links to [name] of classes:  https://danceforparkinsons.org/ o Dance for Parkinson's Class:  [place].  Free offering for people with Parkinson's and care partners; virtual class.  o For more information, contact [phone] or email Ruffin Frederick at [email or web address] o Virtual dance and Pilates for Parkinson's classes: Click on the Community Tab> Parkinson's Movement Initiative Tab.  To register for classes and for more information, visit www.[email or web address].co.za and click the "community" tab.     o [facility] Parkinson's Cycling Classes  - [facility] [employer]: 1pm on Fridays-Live classes at [employer] ([employer] at [email or web address] or [phone]) [facility]: Virtual Classes Mondays and Thursdays Jeanette Caprice classes Tuesday, Wednesday and Thursday (contact [place] at [email or web address]  or [phone])  o [place] levels of classes are offered Tuesdays and Thursdays:  10:30 am,  12 noon & 1:45 pm at [facility].  - Active Stretching with Paula Compton Class starting in March, on Fridays - To observe a class  or for  more information, call [phone] or email [email or web address] . Well-Spring Solutions: o Chief Technology Officer Opportunities:  www.well-springsolutions.org/caregiver-education/caregiver-support-group.  You may also contact Vickki Muff at [email or web address] or [phone].   o Well-Spring Navigator:  Just1Navigator program, a free service to help individuals and families through the journey of determining care for older adults.  The "Navigator" is a [phone], Education officer, museum, who will speak with a prospective client and/or loved ones to provide an assessment of the situation and a set of recommendations for a personalized care plan -- all free of charge, and whether Well-Spring Solutions offers the needed service or not. If the need is not a service we provide, we are well-connected with reputable programs in town that we can refer you to.  www.well-springsolutions.org or to speak with the Navigator, call [phone].

## 2021-01-26 DIAGNOSIS — R634 Abnormal weight loss: Secondary | ICD-10-CM | POA: Diagnosis not present

## 2021-01-26 DIAGNOSIS — Z0000 Encounter for general adult medical examination without abnormal findings: Secondary | ICD-10-CM | POA: Diagnosis not present

## 2021-01-26 DIAGNOSIS — Z833 Family history of diabetes mellitus: Secondary | ICD-10-CM | POA: Diagnosis not present

## 2021-01-26 DIAGNOSIS — Z1210 Encounter for screening for malignant neoplasm of intestinal tract, unspecified: Secondary | ICD-10-CM | POA: Diagnosis not present

## 2021-01-26 DIAGNOSIS — Z131 Encounter for screening for diabetes mellitus: Secondary | ICD-10-CM | POA: Diagnosis not present

## 2021-02-01 ENCOUNTER — Other Ambulatory Visit: Payer: Self-pay

## 2021-02-01 ENCOUNTER — Encounter: Payer: Self-pay | Admitting: Physical Therapy

## 2021-02-01 ENCOUNTER — Ambulatory Visit: Payer: Medicare Other | Attending: Family Medicine | Admitting: Physical Therapy

## 2021-02-01 DIAGNOSIS — R2689 Other abnormalities of gait and mobility: Secondary | ICD-10-CM | POA: Diagnosis not present

## 2021-02-01 DIAGNOSIS — R2681 Unsteadiness on feet: Secondary | ICD-10-CM | POA: Insufficient documentation

## 2021-02-01 DIAGNOSIS — M6281 Muscle weakness (generalized): Secondary | ICD-10-CM | POA: Diagnosis not present

## 2021-02-01 DIAGNOSIS — R29818 Other symptoms and signs involving the nervous system: Secondary | ICD-10-CM | POA: Diagnosis not present

## 2021-02-01 NOTE — Therapy (Signed)
[facility] [address] [place], [place], [address] Phone: [phone]   Fax:  [phone]  Physical Therapy Evaluation  Patient Details  Name: Joshua Browning MRN: [id number] Date of Birth: [birth year] Referring Provider (PT): Tat, Wells Guiles   Encounter Date: 02/01/2021   PT End of Session - 02/01/21 1452     Visit Number 1    Number of Visits 17    Date for PT Re-Evaluation 04/02/21    Authorization Type UHC Medicare    PT Start Time 9357    PT Stop Time 1454    PT Time Calculation (min) 47 min    Activity Tolerance Patient tolerated treatment well    Behavior During Therapy [facility] for tasks assessed/performed             Past Medical History:  Diagnosis Date   BPH (benign prostatic hyperplasia)    Foley catheter in place    removed after turp   GERD (gastroesophageal reflux disease)    Nocturnal leg cramps    occ   Urinary retention    Wears glasses     Past Surgical History:  Procedure Laterality Date   CATARACT EXTRACTION W/ INTRAOCULAR LENS  IMPLANT, BILATERAL Bilateral    INGUINAL HERNIA REPAIR Bilateral RIGHT x 2 0177 umbilical done with last right   left last one done 10 yrs ago   RE-DO RIGHT INGUINAL HERNIA REPAIR AND UMBILICAL HERNIA REPAIR  2001   REMOVAL LEFT EYE INTRAOCULAR LENS AND REPLACEMENT   08-29-2013   TRANSURETHRAL RESECTION OF PROSTATE N/A 09/23/2013   Procedure: TRANSURETHRAL RESECTION OF THE PROSTATE (TURP) WITH GYRUS;  Surgeon: Claybon Jabs, MD;  Location: [facility];  Service: Urology;  Laterality: N/A;   VARICOCELECTOMY Left 07/02/2018   Procedure: VARICOCELECTOMY;  Surgeon: Kathie Rhodes, MD;  Location: [facility];  Service: Urology;  Laterality: Left;    There were no vitals filed for this visit.    Subjective Assessment - 02/01/21 1412     Subjective Recently saw Dr. Carles Collet and she didn't like my walking, so she sent me here.  She upped my  Sinemet to 1.5 tablets each dose and felt queasy initially, but now it's better.  Pt feels he is moving better.  He is no longer working part time at the [employer]; finished late April.  Feeling better in general.  Doing more work around the house and yard.  No falls.    Patient Stated Goals Pt's goals for therapy are to work on my left hip/leg with walking.    Currently in Pain? No/denies                [facility] PT Assessment - 02/01/21 0001       Assessment   Medical Diagnosis Parkinson's disease    Referring Provider (PT) Tat, Wells Guiles    Onset Date/Surgical Date 01/19/21   MD visit   Hand Dominance Right    Prior Therapy 09/2019 d/c from PT      Precautions   Precautions Fall      Balance Screen   Has the patient fallen in the past 6 months No    Has the patient had a decrease in activity level because of a fear of falling?  No    Is the patient reluctant to leave their home because of a fear of falling?  No      Home Ecologist residence  Living Arrangements Spouse/significant other    Available Help at Discharge Family    Type of [place] to enter    Entrance Stairs-Number of Steps 6    Entrance Stairs-Rails Right    [place] Two level;Bed/bath upstairs;1/2 bath on main level    Alternate Level Stairs-Number of Steps 14    Alternate Level Stairs-Rails Left    Home Equipment None      Prior Function   Level of Independence Independent    Vocation Retired    Leisure Recently stopped working at [employer]; enjoys walking with wife in the evenings, as his wife has just retired      Observation/Other Assessments   Focus on Therapeutic Outcomes (FOTO)  NA      Posture/Postural Control   Posture/Postural Control Postural limitations    Postural Limitations Rounded Shoulders;Forward head;Posterior pelvic tilt      ROM / Strength   AROM / PROM / Strength AROM;Strength      AROM   Overall AROM  Deficits     Overall AROM Comments Deficits in full knee extension L>R      Strength   Overall Strength Deficits    Overall Strength Comments Decreased plantar flexion/eccentric dorsiflexion noted LLE, with foot slap noted with gait    Strength Assessment Site Hip;Knee;Ankle    Right/Left Hip Right;Left    Right Hip Flexion 4+/5    Left Hip Flexion 4/5    Right/Left Knee Right;Left    Right Knee Flexion 4+/5    Right Knee Extension 4/5    Left Knee Flexion 4+/5    Left Knee Extension 4/5    Right/Left Ankle Right;Left    Right Ankle Dorsiflexion 5/5    Left Ankle Dorsiflexion 4+/5      Transfers   Transfers Sit to Stand;Stand to Sit    Sit to Stand 6: Modified independent (Device/Increase time);Without upper extremity assist;From chair/3-in-1    Five time sit to stand comments  14.79    Stand to Sit 6: Modified independent (Device/Increase time);Without upper extremity assist;To chair/3-in-1      Ambulation/Gait   Ambulation/Gait Yes    Ambulation/Gait Assistance 5: Supervision    Ambulation Distance (Feet) 200 Feet    Assistive device None    Gait Pattern Step-through pattern;Decreased step length - left;Decreased dorsiflexion - left;Left circumduction;Left foot flat;Right foot flat;Narrow base of support;Poor foot clearance - left    Ambulation Surface Level;Indoor    Gait velocity 14.94 sec= 2.2 ft/sec      Standardized Balance Assessment   Standardized Balance Assessment Timed Up and Go Test;Mini-BESTest      Mini-BESTest   Sit To Stand Normal: Comes to stand without use of hands and stabilizes independently.    Rise to Toes Normal: Stable for 3 s with maximum height.    Stand on one leg (left) Moderate: < 20 s   4.44, 7.06   Stand on one leg (right) Moderate: < 20 s   3.69, 6   Stand on one leg - lowest score 1    Compensatory Stepping Correction - Forward Normal: Recovers independently with a single, large step (second realignement is allowed).    Compensatory Stepping  Correction - Backward No step, OR would fall if not caught, OR falls spontaneously.    Compensatory Stepping Correction - Left Lateral Moderate: Several steps to recover equilibrium    Compensatory Stepping Correction - Right Lateral Moderate: Several steps to recover equilibrium  Stepping Corredtion Lateral - lowest score 1    Stance - Feet together, eyes open, firm surface  Normal: 30s    Stance - Feet together, eyes closed, foam surface  Moderate: < 30s   17.5   Incline - Eyes Closed Normal: Stands independently 30s and aligns with gravity    Change in Gait Speed Normal: Significantly changes walkling speed without imbalance    Walk with head turns - Horizontal Moderate: performs head turns with reduction in gait speed.    Walk with pivot turns Moderate:Turns with feet close SLOW (>4 steps) with good balance.    Step over obstacles Moderate: Steps over box but touches box OR displays cautious behavior by slowing gait.    Timed UP & GO with Dual Task Moderate: Dual Task affects either counting OR walking (>10%) when compared to the TUG without Dual Task.    Mini-BEST total score 19      Timed Up and Go Test   Normal TUG (seconds) 14.54    Cognitive TUG (seconds) 15.13    TUG Comments Scores > 13.5-15 sec indicate increased fall risk.                        Objective measurements completed on examination: See above findings.               PT Education - 02/01/21 1516     Education Details PT eval results, POC    Person(s) Educated Patient    Methods Explanation    Comprehension Verbalized understanding              PT Short Term Goals - 02/01/21 1528       PT SHORT TERM GOAL #1   Title Pt will be independent with HEP for improved balance, posture, functional mobility.  03/05/2021    Time 4    Period Weeks    Status New      PT SHORT TERM GOAL #2   Title Pt will improve 5x sit<>stand to less than or equal to 12.5 seconds for improved transfer  efficiency and safety.    Baseline 14.79 sec    Time 4    Period Weeks    Status New      PT SHORT TERM GOAL #3   Title Pt will improve posterior step and release test to 2 steps or less with independent balance recovery, to demonstrate improved balance recovery in posterior direction.    Time 4    Period Weeks    Status New      PT SHORT TERM GOAL #4   Title Pt will improveTUG score to less than or equal to 13.5 sec for decreased fall risk.    Baseline 14.54    Time 4    Period Weeks    Status New               PT Long Term Goals - 02/01/21 1530       PT LONG TERM GOAL #1   Title Pt will be independent with progression of HEP to address strength, balance, posture, and mobility.  TARGET 04/02/2021    Time 8    Period Weeks    Status New      PT LONG TERM GOAL #2   Title Pt will improve gait velocity to at least 2.62 ft/sec for improved gait efficiency.    Baseline 2.2 ft/sec    Time 8    Period Weeks  Status New      PT LONG TERM GOAL #3   Title Pt will improve MiniBESTest score to at least 22/28 for decreased fall risk.    Time 8    Period Weeks    Status New      PT LONG TERM GOAL #4   Title Pt will verbalize understanding of transition to appropriate community fitness classes upon d/c from PT to maximize gains made in therapy.    Time 8    Period Weeks    Status New                    Plan - 02/01/21 1519     Clinical Impression Statement Pt is a 75 year old male who presents to OPPT with history of Parkinson's disease.  His last bout of PT for Parkinson's was > 1 year ago, and he presents today with decreased functional strength, decreased balance, decreased timing and coordination of gait, abnormal posture, gait abnormality, postural instability.  He has recently stopped working at his part-time job and feels he is moving better as he is doing more activities around his home/yard.  he is at fall risk per FGA, TUG and MiniBESTest scores.  He  demo decreased functional strength per 5x sit<>stand test and he is noted to have LLE circumduction and foot slap with gait.  He would benefit from skilled PT to address the above stated deficits for improved overall functional mobility and decreased fall risk.    Personal Factors and Comorbidities Comorbidity 3+    Comorbidities BPH, GERD, cystoid macular edema.  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    Examination-Activity Limitations Transfers;Stand;Locomotion Level    Examination-Participation Restrictions Community Activity;[employer] activities, community fitness classes   Stability/Clinical Decision Making Evolving/Moderate complexity    Clinical Decision Making Moderate    Rehab Potential Good    PT Frequency 2x / week    PT Duration 8 weeks   plus eval   PT Treatment/Interventions ADLs/Self Care Home Management;Gait training;Functional mobility training;Therapeutic activities;Therapeutic exercise;Balance training;Neuromuscular re-education;DME Instruction;Patient/family education    PT Next Visit Plan Initiate HEP to address funcitonal lower extremity strength, L hip flexor, L ant tib/gastroc strength, flexibility, balance; assess pt's PWR! Moves exercise classes and discuss at some point transition to [facility]! Moves class.  Work on gait and postural stability    Consulted and Agree with Plan of Care Patient             Patient will benefit from skilled therapeutic intervention in order to improve the following deficits and impairments:  Abnormal gait, Difficulty walking, Decreased balance, Decreased mobility, Decreased strength, Postural dysfunction, Impaired flexibility  Visit Diagnosis: Other abnormalities of gait and mobility  Unsteadiness on feet  Muscle weakness (generalized)  Other symptoms and signs involving the nervous system     Problem List Patient Active Problem List   Diagnosis Date Noted   BPH  (benign prostatic hypertrophy) with urinary retention 09/23/2013    Tamira Ryland W. 02/01/2021, 3:34 PM Frazier Butt., PT   [facility] [address] [place], [place], [address] Phone: [phone]   Fax:  [phone]  Name: Joshua Browning MRN: [id number] Date of Birth: 26-May-1946

## 2021-02-10 ENCOUNTER — Encounter: Payer: Self-pay | Admitting: Physical Therapy

## 2021-02-10 ENCOUNTER — Other Ambulatory Visit: Payer: Self-pay

## 2021-02-10 ENCOUNTER — Ambulatory Visit: Payer: Medicare Other | Admitting: Physical Therapy

## 2021-02-10 DIAGNOSIS — G43109 Migraine with aura, not intractable, without status migrainosus: Secondary | ICD-10-CM | POA: Diagnosis not present

## 2021-02-10 DIAGNOSIS — Z961 Presence of intraocular lens: Secondary | ICD-10-CM | POA: Diagnosis not present

## 2021-02-10 DIAGNOSIS — M6281 Muscle weakness (generalized): Secondary | ICD-10-CM

## 2021-02-10 DIAGNOSIS — R2689 Other abnormalities of gait and mobility: Secondary | ICD-10-CM

## 2021-02-10 DIAGNOSIS — R29818 Other symptoms and signs involving the nervous system: Secondary | ICD-10-CM | POA: Diagnosis not present

## 2021-02-10 DIAGNOSIS — R2681 Unsteadiness on feet: Secondary | ICD-10-CM

## 2021-02-10 DIAGNOSIS — T8529XD Other mechanical complication of intraocular lens, subsequent encounter: Secondary | ICD-10-CM | POA: Diagnosis not present

## 2021-02-10 NOTE — Therapy (Signed)
[facility] [address] [place], [place], [address] Phone: [phone]   Fax:  [phone]  Physical Therapy Treatment  Patient Details  Name: Joshua Browning MRN: [id number] Date of Birth: 1945/10/11 Referring Provider (PT): Tat, Wells Guiles   Encounter Date: 02/10/2021   PT End of Session - 02/11/21 0912     Visit Number 2    Number of Visits 17    Date for PT Re-Evaluation 04/02/21    Authorization Type UHC Medicare    PT Start Time 6606    PT Stop Time 1700    PT Time Calculation (min) 43 min    Activity Tolerance Patient tolerated treatment well    Behavior During Therapy [facility] for tasks assessed/performed             Past Medical History:  Diagnosis Date   BPH (benign prostatic hyperplasia)    Foley catheter in place    removed after turp   GERD (gastroesophageal reflux disease)    Nocturnal leg cramps    occ   Urinary retention    Wears glasses     Past Surgical History:  Procedure Laterality Date   CATARACT EXTRACTION W/ INTRAOCULAR LENS  IMPLANT, BILATERAL Bilateral    INGUINAL HERNIA REPAIR Bilateral RIGHT x 2 3016 umbilical done with last right   left last one done 10 yrs ago   RE-DO RIGHT INGUINAL HERNIA REPAIR AND UMBILICAL HERNIA REPAIR  2001   REMOVAL LEFT EYE INTRAOCULAR LENS AND REPLACEMENT   08-29-2013   TRANSURETHRAL RESECTION OF PROSTATE N/A 09/23/2013   Procedure: TRANSURETHRAL RESECTION OF THE PROSTATE (TURP) WITH GYRUS;  Surgeon: Claybon Jabs, MD;  Location: [facility];  Service: Urology;  Laterality: N/A;   VARICOCELECTOMY Left 07/02/2018   Procedure: VARICOCELECTOMY;  Surgeon: Kathie Rhodes, MD;  Location: [facility];  Service: Urology;  Laterality: Left;    There were no vitals filed for this visit.   Subjective Assessment - 02/10/21 1619     Subjective Feeling a little stiffer today after sitting down at the ophthmalogist. No falls. Has been  using his recumbent bicycle. Does not go to rock steady boxing anymore, but his sons got his own little punching bags at home.    Patient Stated Goals Pt's goals for therapy are to work on my left hip/leg with walking.    Currently in Pain? No/denies                [facility] PT Assessment - 02/10/21 1644       Strength   Left Hip ABduction 4+/5                           [facility] Adult PT Treatment/Exercise - 02/11/21 0918       Transfers   Comments from mat table with LLE staggered posteriorly for incr weight bearing, performed with BUE support to stand and none to lower - added to HEP      Ambulation/Gait   Ambulation/Gait Yes    Ambulation/Gait Assistance 5: Supervision    Ambulation/Gait Assistance Details clinic distances between activities - cued for heel strike and posture    Assistive device None    Gait Pattern Step-through pattern;Decreased step length - left;Decreased dorsiflexion - left;Left circumduction;Left foot flat;Right foot flat;Narrow base of support;Poor foot clearance - left    Ambulation Surface Level;Indoor  Balance Exercises - 02/10/21 1651       Balance Exercises: Standing   Marching Upper extremity assist 1;Limitations    Marching Limitations at countertop, cues for posture and ROM and controlled lowering down to the ground 2x10 reps alternating legs    Other Standing Exercises with BUE support: x10 reps lateral weight shifting, x10 reps heel toe raises for active DF/PF    Other Standing Exercises Comments at staircase with LUE support on handrail (to simulate pt's set up at home for HEP), performed step up/up and down/down 2x10 reps with alternating legs with cues for foot clearance             Pt performs PWR! Moves in sitting position x 10 reps each  PWR! Up for improved posture  PWR! Rock for improved weighshifting - cued to look up at hand.   Verbal and visual cues provided for technique and  intensity.    Pt has performed PWR moves at previous bout of therapy, began to review today and reintroduce.    Access Code: [email or web address] URL: [email or web address] Date: 02/10/2021 Prepared by: Janann August  Initiated HEP for functional strengthening/balance - see Okay for more details.   Exercises Staggered Sit-to-Stand (Mirrored) - 1 x daily - 5 x weekly - 2 sets - 5 reps Staggered Stance Forward Backward Weight Shift with Counter Support - 1 x daily - 5 x weekly - 2 sets - 10 reps Standing Marching - 1 x daily - 5 x weekly - 2 sets - 10 reps Forward Step Up - 1 x daily - 5 x weekly - 2 sets - 5 reps    PT Education - 02/11/21 0910     Education Details initial HEP    Person(s) Educated Patient    Methods Explanation;Demonstration;Handout;Verbal cues    Comprehension Verbalized understanding;Returned demonstration              PT Short Term Goals - 02/01/21 1528       PT SHORT TERM GOAL #1   Title Pt will be independent with HEP for improved balance, posture, functional mobility.  03/05/2021    Time 4    Period Weeks    Status New      PT SHORT TERM GOAL #2   Title Pt will improve 5x sit<>stand to less than or equal to 12.5 seconds for improved transfer efficiency and safety.    Baseline 14.79 sec    Time 4    Period Weeks    Status New      PT SHORT TERM GOAL #3   Title Pt will improve posterior step and release test to 2 steps or less with independent balance recovery, to demonstrate improved balance recovery in posterior direction.    Time 4    Period Weeks    Status New      PT SHORT TERM GOAL #4   Title Pt will improveTUG score to less than or equal to 13.5 sec for decreased fall risk.    Baseline 14.54    Time 4    Period Weeks    Status New               PT Long Term Goals - 02/01/21 1530       PT LONG TERM GOAL #1   Title Pt will be independent with progression of HEP to address strength, balance, posture, and  mobility.  TARGET 04/02/2021    Time 8    Period Weeks    Status  New      PT LONG TERM GOAL #2   Title Pt will improve gait velocity to at least 2.62 ft/sec for improved gait efficiency.    Baseline 2.2 ft/sec    Time 8    Period Weeks    Status New      PT LONG TERM GOAL #3   Title Pt will improve MiniBESTest score to at least 22/28 for decreased fall risk.    Time 8    Period Weeks    Status New      PT LONG TERM GOAL #4   Title Pt will verbalize understanding of transition to appropriate community fitness classes upon d/c from PT to maximize gains made in therapy.    Time 8    Period Weeks    Status New                   Plan - 02/11/21 0762     Clinical Impression Statement Today's skilled session focused on initating HEP for BLE (LLE>RLE) strengthening with focus on L hip flexor strength and active ankle DF/PF. Pt tolerated session well, normally when pt goes to stand he staggers his RLE behind his L, practiced today with LLE staggered posteriorly as a means of strengthening and added to HEP. Began to re-introduce PWR moves in session today. Will continue to progress towards LTGs.    Personal Factors and Comorbidities Comorbidity 3+    Comorbidities BPH, GERD, cystoid macular edema.  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    Examination-Activity Limitations Transfers;Stand;Locomotion Level    Examination-Participation Restrictions Community Activity;[employer] activities, community fitness classes   Stability/Clinical Decision Making Evolving/Moderate complexity    Rehab Potential Good    PT Frequency 2x / week    PT Duration 8 weeks   plus eval   PT Treatment/Interventions ADLs/Self Care Home Management;Gait training;Functional mobility training;Therapeutic activities;Therapeutic exercise;Balance training;Neuromuscular re-education;DME Instruction;Patient/family education    PT Next Visit Plan how is HEP?  review and add to as appropriate. L hip flexor, L ant tib/gastroc strength, flexibility, balance; assess pt's PWR! Moves exercise classes and discuss at some point transition to [facility]! Moves class and continue to reintroduce PWR moves..  Work on gait and postural stability    Consulted and Agree with Plan of Care Patient             Patient will benefit from skilled therapeutic intervention in order to improve the following deficits and impairments:  Abnormal gait, Difficulty walking, Decreased balance, Decreased mobility, Decreased strength, Postural dysfunction, Impaired flexibility  Visit Diagnosis: Other abnormalities of gait and mobility  Unsteadiness on feet  Muscle weakness (generalized)  Other symptoms and signs involving the nervous system     Problem List Patient Active Problem List   Diagnosis Date Noted   BPH (benign prostatic hypertrophy) with urinary retention 09/23/2013    Arliss Journey, PT, DPT 02/11/2021, 9:26 AM  [facility] [address] [place], [place], [address] Phone: [phone]   Fax:  [phone]  Name: Joshua Browning MRN: [id number] Date of Birth: 08/19/1945

## 2021-02-10 NOTE — Patient Instructions (Signed)
Access Code: [email or web address] URL: [email or web address] Date: 02/10/2021 Prepared by: Janann August  Exercises Staggered Sit-to-Stand (Mirrored) - 1 x daily - 5 x weekly - 2 sets - 5 reps Staggered Stance Forward Backward Weight Shift with Counter Support - 1 x daily - 5 x weekly - 2 sets - 10 reps Standing Marching - 1 x daily - 5 x weekly - 2 sets - 10 reps Forward Step Up - 1 x daily - 5 x weekly - 2 sets - 5 reps

## 2021-02-12 ENCOUNTER — Ambulatory Visit: Payer: Medicare Other | Attending: Family Medicine | Admitting: Physical Therapy

## 2021-02-12 ENCOUNTER — Other Ambulatory Visit: Payer: Self-pay

## 2021-02-12 DIAGNOSIS — R29818 Other symptoms and signs involving the nervous system: Secondary | ICD-10-CM | POA: Diagnosis not present

## 2021-02-12 DIAGNOSIS — M6281 Muscle weakness (generalized): Secondary | ICD-10-CM | POA: Diagnosis not present

## 2021-02-12 DIAGNOSIS — R2689 Other abnormalities of gait and mobility: Secondary | ICD-10-CM | POA: Insufficient documentation

## 2021-02-12 DIAGNOSIS — R2681 Unsteadiness on feet: Secondary | ICD-10-CM | POA: Diagnosis not present

## 2021-02-12 NOTE — Therapy (Signed)
[facility] [address] [place], [place], [address] Phone: [phone]   Fax:  [phone]  Physical Therapy Treatment  Patient Details  Name: Joshua Browning MRN: [id number] Date of Birth: 12-May-1946 Referring Provider (PT): Tat, Wells Guiles   Encounter Date: 02/12/2021   PT End of Session - 02/12/21 1018     Visit Number 3    Number of Visits 17    Date for PT Re-Evaluation 04/02/21    Authorization Type UHC Medicare    PT Start Time 1019    PT Stop Time 1100    PT Time Calculation (min) 41 min    Activity Tolerance Patient tolerated treatment well    Behavior During Therapy WFL for tasks assessed/performed             Past Medical History:  Diagnosis Date   BPH (benign prostatic hyperplasia)    Foley catheter in place    removed after turp   GERD (gastroesophageal reflux disease)    Nocturnal leg cramps    occ   Urinary retention    Wears glasses     Past Surgical History:  Procedure Laterality Date   CATARACT EXTRACTION W/ INTRAOCULAR LENS  IMPLANT, BILATERAL Bilateral    INGUINAL HERNIA REPAIR Bilateral RIGHT x 2 3546 umbilical done with last right   left last one done 10 yrs ago   RE-DO RIGHT INGUINAL HERNIA REPAIR AND UMBILICAL HERNIA REPAIR  2001   REMOVAL LEFT EYE INTRAOCULAR LENS AND REPLACEMENT   08-29-2013   TRANSURETHRAL RESECTION OF PROSTATE N/A 09/23/2013   Procedure: TRANSURETHRAL RESECTION OF THE PROSTATE (TURP) WITH GYRUS;  Surgeon: Claybon Jabs, MD;  Location: [facility];  Service: Urology;  Laterality: N/A;   VARICOCELECTOMY Left 07/02/2018   Procedure: VARICOCELECTOMY;  Surgeon: Kathie Rhodes, MD;  Location: [facility];  Service: Urology;  Laterality: Left;    There were no vitals filed for this visit.   Subjective Assessment - 02/12/21 1018     Subjective Been working on the exercises; feel a little more wiggly this morning (Dr. Carles Collet has increased  my medication by 1/2 tablet)    Patient Stated Goals Pt's goals for therapy are to work on my left hip/leg with walking.    Currently in Pain? No/denies                   NMR:  Pt performs PWR! Moves in sitting position x 10 reps each   PWR! Up for improved posture   PWR! Rock for improved weighshifting - cued to look up at hand.  PWR! Twist for improved trunk rotation-cues for open posture in mid section  PWR! Step, both legs step out and in.   Verbal and visual cues provided for technique and intensity.     Pt has performed PWR moves at previous bout of therapy, reviewed again today to reintroduce.      Access Code: [email or web address] URL: [email or web address] Date: 02/10/2021 Prepared by: Janann August   Reviewed HEP given last visit, with pt return demo understanding.   Exercises Staggered Sit-to-Stand (Mirrored) - 1 x daily - 5 x weekly - 2 sets - 5 reps Staggered Stance Forward Backward Weight Shift with Counter Support - 1 x daily - 5 x weekly - 2 sets - 10 reps Standing Marching - 1 x daily - 5 x weekly - 2 sets - 10 reps Forward Step Up - 1 x daily - 5 x weekly -  2 sets - 5 reps           [facility] Adult PT Treatment/Exercise - 02/12/21 0001       Exercises   Exercises Knee/Hip      Knee/Hip Exercises: Stretches   Active Hamstring Stretch Right;Left;3 reps;30 seconds    Active Hamstring Stretch Limitations foot propped on floor, cues for technique    Gastroc Stretch Both;Right;Left;2 reps;20 seconds    Gastroc Stretch Limitations Bilat feet on step, pushing heels down, then single foot propped at 4" block.      Knee/Hip Exercises: Aerobic   Stepper SciFit, Level 1.5>2, 4 extremities x 5 minutes, cues for steps/min > 80 for improved flexibility and strength      Knee/Hip Exercises: Standing   Forward Step Up Right;Left;1 set;10 reps;Hand Hold: 1;Step Height: 6"                      PT Short Term Goals - 02/01/21 1528        PT SHORT TERM GOAL #1   Title Pt will be independent with HEP for improved balance, posture, functional mobility.  03/05/2021    Time 4    Period Weeks    Status New      PT SHORT TERM GOAL #2   Title Pt will improve 5x sit<>stand to less than or equal to 12.5 seconds for improved transfer efficiency and safety.    Baseline 14.79 sec    Time 4    Period Weeks    Status New      PT SHORT TERM GOAL #3   Title Pt will improve posterior step and release test to 2 steps or less with independent balance recovery, to demonstrate improved balance recovery in posterior direction.    Time 4    Period Weeks    Status New      PT SHORT TERM GOAL #4   Title Pt will improveTUG score to less than or equal to 13.5 sec for decreased fall risk.    Baseline 14.54    Time 4    Period Weeks    Status New               PT Long Term Goals - 02/01/21 1530       PT LONG TERM GOAL #1   Title Pt will be independent with progression of HEP to address strength, balance, posture, and mobility.  TARGET 04/02/2021    Time 8    Period Weeks    Status New      PT LONG TERM GOAL #2   Title Pt will improve gait velocity to at least 2.62 ft/sec for improved gait efficiency.    Baseline 2.2 ft/sec    Time 8    Period Weeks    Status New      PT LONG TERM GOAL #3   Title Pt will improve MiniBESTest score to at least 22/28 for decreased fall risk.    Time 8    Period Weeks    Status New      PT LONG TERM GOAL #4   Title Pt will verbalize understanding of transition to appropriate community fitness classes upon d/c from PT to maximize gains made in therapy.    Time 8    Period Weeks    Status New                   Plan - 02/12/21 1040     Clinical  Impression Statement Pt return demo understanding of initial HEP review.  Continued to work on seated PWR! Moves to address posture and trunk flexibility, initiating stepping.  Will continue to work on strength, flexibility and  balance, with continued addition of PWR! Moves as appropriate.    Personal Factors and Comorbidities Comorbidity 3+    Comorbidities BPH, GERD, cystoid macular edema.  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    Examination-Activity Limitations Transfers;Stand;Locomotion Level    Examination-Participation Restrictions Community Activity;[employer] activities, community fitness classes   Stability/Clinical Decision Making Evolving/Moderate complexity    Rehab Potential Good    PT Frequency 2x / week    PT Duration 8 weeks   plus eval   PT Treatment/Interventions ADLs/Self Care Home Management;Gait training;Functional mobility training;Therapeutic activities;Therapeutic exercise;Balance training;Neuromuscular re-education;DME Instruction;Patient/family education    PT Next Visit Plan Review and add to as appropriate. L hip flexor, L ant tib/gastroc strength, flexibility, balance; assess pt's PWR! Moves exercises and discuss at some point transition to [facility]! Moves class and continue to reintroduce PWR moves..  Work on gait and postural stability    Consulted and Agree with Plan of Care Patient             Patient will benefit from skilled therapeutic intervention in order to improve the following deficits and impairments:  Abnormal gait, Difficulty walking, Decreased balance, Decreased mobility, Decreased strength, Postural dysfunction, Impaired flexibility  Visit Diagnosis: Muscle weakness (generalized)  Unsteadiness on feet  Other abnormalities of gait and mobility     Problem List Patient Active Problem List   Diagnosis Date Noted   BPH (benign prostatic hypertrophy) with urinary retention 09/23/2013    Earlin Sweeden W. 02/12/2021, 10:58 AM Frazier Butt., PT   [facility] [address] [place], [place], [address] Phone: [phone]   Fax:   [phone]  Name: Joshua Browning MRN: [id number] Date of Birth: [birth year]

## 2021-02-23 ENCOUNTER — Ambulatory Visit: Payer: Medicare Other | Admitting: Physical Therapy

## 2021-02-25 ENCOUNTER — Ambulatory Visit: Payer: Medicare Other | Admitting: Physical Therapy

## 2021-03-01 ENCOUNTER — Ambulatory Visit: Payer: Medicare Other | Admitting: Physical Therapy

## 2021-03-04 ENCOUNTER — Ambulatory Visit: Payer: Medicare Other | Admitting: Physical Therapy

## 2021-03-08 ENCOUNTER — Ambulatory Visit: Payer: Medicare Other | Admitting: Physical Therapy

## 2021-03-08 ENCOUNTER — Other Ambulatory Visit: Payer: Self-pay

## 2021-03-08 ENCOUNTER — Encounter: Payer: Self-pay | Admitting: Physical Therapy

## 2021-03-08 DIAGNOSIS — R2689 Other abnormalities of gait and mobility: Secondary | ICD-10-CM

## 2021-03-08 DIAGNOSIS — M6281 Muscle weakness (generalized): Secondary | ICD-10-CM

## 2021-03-08 DIAGNOSIS — R2681 Unsteadiness on feet: Secondary | ICD-10-CM | POA: Diagnosis not present

## 2021-03-08 DIAGNOSIS — R29818 Other symptoms and signs involving the nervous system: Secondary | ICD-10-CM | POA: Diagnosis not present

## 2021-03-08 NOTE — Therapy (Signed)
[facility] [address] [place], [place], [address] Phone: [phone]   Fax:  [phone]  Physical Therapy Treatment  Patient Details  Name: Joshua Browning MRN: [id number] Date of Birth: 02-04-1946 Referring Provider (PT): Tat, Wells Guiles   Encounter Date: 03/08/2021   PT End of Session - 03/08/21 1320     Visit Number 4    Number of Visits 17    Date for PT Re-Evaluation 04/02/21    Authorization Type UHC Medicare    PT Start Time 1320    PT Stop Time 1400    PT Time Calculation (min) 40 min    Activity Tolerance Patient tolerated treatment well    Behavior During Therapy WFL for tasks assessed/performed             Past Medical History:  Diagnosis Date   BPH (benign prostatic hyperplasia)    Foley catheter in place    removed after turp   GERD (gastroesophageal reflux disease)    Nocturnal leg cramps    occ   Urinary retention    Wears glasses     Past Surgical History:  Procedure Laterality Date   CATARACT EXTRACTION W/ INTRAOCULAR LENS  IMPLANT, BILATERAL Bilateral    INGUINAL HERNIA REPAIR Bilateral RIGHT x 2 0093 umbilical done with last right   left last one done 10 yrs ago   RE-DO RIGHT INGUINAL HERNIA REPAIR AND UMBILICAL HERNIA REPAIR  2001   REMOVAL LEFT EYE INTRAOCULAR LENS AND REPLACEMENT   08-29-2013   TRANSURETHRAL RESECTION OF PROSTATE N/A 09/23/2013   Procedure: TRANSURETHRAL RESECTION OF THE PROSTATE (TURP) WITH GYRUS;  Surgeon: Claybon Jabs, MD;  Location: [facility];  Service: Urology;  Laterality: N/A;   VARICOCELECTOMY Left 07/02/2018   Procedure: VARICOCELECTOMY;  Surgeon: Kathie Rhodes, MD;  Location: [facility];  Service: Urology;  Laterality: Left;    There were no vitals filed for this visit.   Subjective Assessment - 03/08/21 1320     Subjective Had to be out of PT for several weeks, due to wife and then pt getting COVID.  He is feeling  better.    Patient Stated Goals Pt's goals for therapy are to work on my left hip/leg with walking.    Currently in Pain? No/denies                               [facility] Adult PT Treatment/Exercise - 03/08/21 0001       Knee/Hip Exercises: Stretches   Active Hamstring Stretch Right;Left;3 reps;30 seconds    Active Hamstring Stretch Limitations foot propped on floor, cues for technique    Hip Flexor Stretch Both;5 reps;10 seconds    Hip Flexor Stretch Limitations Standing anterior/posterior weigthshifting      Knee/Hip Exercises: Aerobic   Stepper SciFit, Level 2, 4 extremities x 5 minutes, cues for steps/min > 80 for improved flexibility and strength             Access Code: [email or web address] URL: [email or web address] Date: 02/10/2021 Prepared by: Janann August   Exercises-Reviewed HEP from last visit, pt return demo understanding. Staggered Sit-to-Stand (Mirrored) - 1 x daily - 5 x weekly - 2 sets - 5 reps Staggered Stance Forward Backward Weight Shift with Counter Support - 1 x daily - 5 x weekly - 2 sets - 10 reps Standing Marching - 1 x daily - 5 x weekly - 2  sets - 10 reps Forward Step Up - 1 x daily - 5 x weekly - 2 sets - 5 reps      Balance Exercises - 03/08/21 0001       Balance Exercises: Standing   Stepping Strategy Posterior;UE support;10 reps;Lateral;Anterior    Heel Raises Both;10 reps            Toe raises x 10 reps Standing on Airex:  forward>back step and weightshift x 10 reps, cues for lifting foot for improved foot clearance to avoid catching foot on Airex.  UE support at counter.   PT Education - 03/08/21 1356     Education Details Progress towards goals, POC/scheduling    Person(s) Educated Patient    Methods Explanation    Comprehension Verbalized understanding              PT Short Term Goals - 03/08/21 1328       PT SHORT TERM GOAL #1   Title Pt will be independent with HEP for improved balance,  posture, functional mobility.  03/05/2021    Baseline independent with initial HEP    Time 4    Period Weeks    Status Achieved      PT SHORT TERM GOAL #2   Title Pt will improve 5x sit<>stand to less than or equal to 12.5 seconds for improved transfer efficiency and safety.    Baseline 14.79 sec; 12.78 sec 03/08/21    Time 4    Period Weeks    Status Achieved      PT SHORT TERM GOAL #3   Title Pt will improve posterior step and release test to 2 steps or less with independent balance recovery, to demonstrate improved balance recovery in posterior direction.    Baseline 1 step to recover posterior direction 03/08/2021    Time 4    Period Weeks    Status Achieved      PT SHORT TERM GOAL #4   Title Pt will improveTUG score to less than or equal to 13.5 sec for decreased fall risk.    Baseline 14.54, 11.19 sec 03/08/2021    Time 4    Period Weeks    Status Achieved               PT Long Term Goals - 02/01/21 1530       PT LONG TERM GOAL #1   Title Pt will be independent with progression of HEP to address strength, balance, posture, and mobility.  TARGET 04/02/2021    Time 8    Period Weeks    Status New      PT LONG TERM GOAL #2   Title Pt will improve gait velocity to at least 2.62 ft/sec for improved gait efficiency.    Baseline 2.2 ft/sec    Time 8    Period Weeks    Status New      PT LONG TERM GOAL #3   Title Pt will improve MiniBESTest score to at least 22/28 for decreased fall risk.    Time 8    Period Weeks    Status New      PT LONG TERM GOAL #4   Title Pt will verbalize understanding of transition to appropriate community fitness classes upon d/c from PT to maximize gains made in therapy.    Time 8    Period Weeks    Status New  Plan - 03/08/21 1359     Clinical Impression Statement Pt has been missed therapy since 02/12/2021, due to pt/wife having COVID.  He returns today and STGs assessed.  Despite being away, pt reports  doing his exercises in the past several days.  He has met all 4 STGs, including improved 5x sit<>stand and improved TUG measures.  Remaining time in PT session today focused on flexibility/strength, and balance activities incorporating step strategies and compliant surfaces to reinforce increased amplitude of movement for foot clearance with gait and standing balance.  He will continue to benefit from skilled PT to work towards improved mobility/decreased fall risk towards LTGs.    Personal Factors and Comorbidities Comorbidity 3+    Comorbidities BPH, GERD, cystoid macular edema.  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    Examination-Activity Limitations Transfers;Stand;Locomotion Level    Examination-Participation Restrictions Community Activity;[employer] activities, community fitness classes   Stability/Clinical Decision Making Evolving/Moderate complexity    Rehab Potential Good    PT Frequency 2x / week    PT Duration 8 weeks   plus eval   PT Treatment/Interventions ADLs/Self Care Home Management;Gait training;Functional mobility training;Therapeutic activities;Therapeutic exercise;Balance training;Neuromuscular re-education;DME Instruction;Patient/family education    PT Next Visit Plan Review and add to as appropriate (consider adding stretches to HEP). L hip flexor, L ant tib/gastroc strength, flexibility, balance; assess pt's PWR! Moves exercises and discuss at some point transition to [facility]! Moves class and continue to reintroduce PWR moves..  Work on gait and postural stability    Consulted and Agree with Plan of Care Patient             Patient will benefit from skilled therapeutic intervention in order to improve the following deficits and impairments:  Abnormal gait, Difficulty walking, Decreased balance, Decreased mobility, Decreased strength, Postural dysfunction, Impaired flexibility  Visit Diagnosis: Unsteadiness on  feet  Other abnormalities of gait and mobility  Muscle weakness (generalized)     Problem List Patient Active Problem List   Diagnosis Date Noted   BPH (benign prostatic hypertrophy) with urinary retention 09/23/2013    Dera Vanaken W. 03/08/2021, 3:20 PM Frazier Butt., PT   [facility] [address] [place], [place], [address] Phone: [phone]   Fax:  [phone]  Name: Joshua Browning MRN: [id number] Date of Birth: Apr 18, 1946

## 2021-03-09 ENCOUNTER — Ambulatory Visit: Payer: Medicare Other | Admitting: Occupational Therapy

## 2021-03-09 ENCOUNTER — Ambulatory Visit: Payer: Medicare Other

## 2021-03-09 ENCOUNTER — Ambulatory Visit: Payer: Medicare Other | Admitting: Physical Therapy

## 2021-03-10 ENCOUNTER — Ambulatory Visit: Payer: Medicare Other | Admitting: Physical Therapy

## 2021-03-10 ENCOUNTER — Other Ambulatory Visit: Payer: Self-pay

## 2021-03-10 DIAGNOSIS — M6281 Muscle weakness (generalized): Secondary | ICD-10-CM

## 2021-03-10 DIAGNOSIS — R29818 Other symptoms and signs involving the nervous system: Secondary | ICD-10-CM

## 2021-03-10 DIAGNOSIS — R2681 Unsteadiness on feet: Secondary | ICD-10-CM | POA: Diagnosis not present

## 2021-03-10 DIAGNOSIS — R2689 Other abnormalities of gait and mobility: Secondary | ICD-10-CM

## 2021-03-10 NOTE — Therapy (Signed)
[facility] [address] [place], [place], [address] Phone: [phone]   Fax:  [phone]  Physical Therapy Treatment  Patient Details  Name: Joshua Browning MRN: [id number] Date of Birth: [birth year] Referring Provider (PT): Tat, Wells Guiles   Encounter Date: 03/10/2021   PT End of Session - 03/10/21 1025     Visit Number 5    Number of Visits 17    Date for PT Re-Evaluation 04/02/21    Authorization Type UHC Medicare    PT Start Time 1022    PT Stop Time 1100    PT Time Calculation (min) 38 min    Activity Tolerance Patient tolerated treatment well    Behavior During Therapy WFL for tasks assessed/performed             Past Medical History:  Diagnosis Date   BPH (benign prostatic hyperplasia)    Foley catheter in place    removed after turp   GERD (gastroesophageal reflux disease)    Nocturnal leg cramps    occ   Urinary retention    Wears glasses     Past Surgical History:  Procedure Laterality Date   CATARACT EXTRACTION W/ INTRAOCULAR LENS  IMPLANT, BILATERAL Bilateral    INGUINAL HERNIA REPAIR Bilateral RIGHT x 2 [id number] umbilical done with last right   left last one done 10 yrs ago   RE-DO RIGHT INGUINAL HERNIA REPAIR AND UMBILICAL HERNIA REPAIR  2001   REMOVAL LEFT EYE INTRAOCULAR LENS AND REPLACEMENT   08-29-2013   TRANSURETHRAL RESECTION OF PROSTATE N/A 09/23/2013   Procedure: TRANSURETHRAL RESECTION OF THE PROSTATE (TURP) WITH GYRUS;  Surgeon: Claybon Jabs, MD;  Location: [facility];  Service: Urology;  Laterality: N/A;   VARICOCELECTOMY Left 07/02/2018   Procedure: VARICOCELECTOMY;  Surgeon: Kathie Rhodes, MD;  Location: [facility];  Service: Urology;  Laterality: Left;    There were no vitals filed for this visit.   Subjective Assessment - 03/10/21 1023     Subjective Feeling good today; no pain.  No changes.    Patient Stated Goals Pt's goals for therapy are  to work on my left hip/leg with walking.    Currently in Pain? No/denies                               [facility] Adult PT Treatment/Exercise - 03/10/21 0001       Ambulation/Gait   Ambulation/Gait Yes    Ambulation/Gait Assistance 5: Supervision    Ambulation/Gait Assistance Details Gait as warm up at beginning of session, cues for posture, step length    Ambulation Distance (Feet) 600 Feet    Assistive device None    Gait Pattern Step-through pattern;Decreased step length - left;Decreased dorsiflexion - left;Left circumduction;Left foot flat;Right foot flat;Narrow base of support;Poor foot clearance - left    Ambulation Surface Level;Indoor      Knee/Hip Exercises: Stretches   Active Hamstring Stretch Right;Left;3 reps;30 seconds    Active Hamstring Stretch Limitations foot propped on floor, cues for technique    Hip Flexor Stretch Both;5 reps;10 seconds    Hip Flexor Stretch Limitations Standing anterior/posterior weigthshifting    Gastroc Stretch Both;Right;Left;2 reps;20 seconds    Gastroc Stretch Limitations foot propped at 4" step      Knee/Hip Exercises: Aerobic   Stepper SciFit, Level 2.2, legs x 5 minutes, cues for steps/min >80 for improved flexibility and strength  Balance Exercises - 03/10/21 0001       Balance Exercises: Standing   Wall Bumps Hip;Eyes opened;10 reps;Limitations    Wall Bumps Limitations Hip strategy work at [employer], cues to "hinge at hips"    Stepping Strategy Posterior;UE support;10 reps;Limitations    Stepping Strategy Limitations 2 sets, 1 set with UE support, 2nd set with no UE support.    Heel Raises Both;10 reps    Toe Raise Both;10 reps;Limitations    Toe Raise Limitations Cues for smooth weightshift anterior/posterior with heel/toe raises; pt with near posterior LOB several times with toe raises.    Other Standing Exercises Progressed to variable perturbations with blue theraband to ellicit posterior  step reaction.  Performed approx 10 times with UE support, then diagonal/lateral step recovery, then final 3 reps with no UE support.  Pt needs cues for deliberate step, return to [facility]! Up posture in midline and improves with repetition.               PT Education - 03/10/21 1235     Education Details Additions to HEP-stretching    Person(s) Educated Patient    Methods Explanation;Demonstration;Handout    Comprehension Verbalized understanding;Returned demonstration              PT Short Term Goals - 03/08/21 1328       PT SHORT TERM GOAL #1   Title Pt will be independent with HEP for improved balance, posture, functional mobility.  03/05/2021    Baseline independent with initial HEP    Time 4    Period Weeks    Status Achieved      PT SHORT TERM GOAL #2   Title Pt will improve 5x sit<>stand to less than or equal to 12.5 seconds for improved transfer efficiency and safety.    Baseline 14.79 sec; 12.78 sec 03/08/21    Time 4    Period Weeks    Status Achieved      PT SHORT TERM GOAL #3   Title Pt will improve posterior step and release test to 2 steps or less with independent balance recovery, to demonstrate improved balance recovery in posterior direction.    Baseline 1 step to recover posterior direction 03/08/2021    Time 4    Period Weeks    Status Achieved      PT SHORT TERM GOAL #4   Title Pt will improveTUG score to less than or equal to 13.5 sec for decreased fall risk.    Baseline 14.54, 11.19 sec 03/08/2021    Time 4    Period Weeks    Status Achieved               PT Long Term Goals - 02/01/21 1530       PT LONG TERM GOAL #1   Title Pt will be independent with progression of HEP to address strength, balance, posture, and mobility.  TARGET 04/02/2021    Time 8    Period Weeks    Status New      PT LONG TERM GOAL #2   Title Pt will improve gait velocity to at least 2.62 ft/sec for improved gait efficiency.    Baseline 2.2 ft/sec    Time 8     Period Weeks    Status New      PT LONG TERM GOAL #3   Title Pt will improve MiniBESTest score to at least 22/28 for decreased fall risk.    Time 8    Period  Weeks    Status New      PT LONG TERM GOAL #4   Title Pt will verbalize understanding of transition to appropriate community fitness classes upon d/c from PT to maximize gains made in therapy.    Time 8    Period Weeks    Status New                   Plan - 03/10/21 1236     Clinical Impression Statement Continued to focus on lower extremity flexibility as well as posterior balance recovery.  He initially appears to nearly have LOB in posterior direction even with UE support, and PT educated pt on limits of stability and PWR! Up position in midline to prepare for balance perturbations.  He improves with practice in PT session today, but will need additional work on balance recovery in posterior direction.    Personal Factors and Comorbidities Comorbidity 3+    Comorbidities BPH, GERD, cystoid macular edema.  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    Examination-Activity Limitations Transfers;Stand;Locomotion Level    Examination-Participation Restrictions Community Activity;[employer] activities, community fitness classes   Stability/Clinical Decision Making Evolving/Moderate complexity    Rehab Potential Good    PT Frequency 2x / week    PT Duration 8 weeks   plus eval   PT Treatment/Interventions ADLs/Self Care Home Management;Gait training;Functional mobility training;Therapeutic activities;Therapeutic exercise;Balance training;Neuromuscular re-education;DME Instruction;Patient/family education    PT Next Visit Plan Review stretches added to HEP this visit; assess pt's PWR! Moves exercises and discuss at some point transition to [facility]! Moves class and continue to reintroduce PWR moves..  Work on gait and postural stability, posterior balance recovery     Consulted and Agree with Plan of Care Patient             Patient will benefit from skilled therapeutic intervention in order to improve the following deficits and impairments:  Abnormal gait, Difficulty walking, Decreased balance, Decreased mobility, Decreased strength, Postural dysfunction, Impaired flexibility  Visit Diagnosis: Unsteadiness on feet  Other abnormalities of gait and mobility  Muscle weakness (generalized)  Other symptoms and signs involving the nervous system     Problem List Patient Active Problem List   Diagnosis Date Noted   BPH (benign prostatic hypertrophy) with urinary retention 09/23/2013    Maleeah Crossman W. 03/10/2021, 12:40 PM Frazier Butt., PT   [facility] [name] [place], [place], [address] Phone: [phone]   Fax:  [phone]  Name: Joshua Browning MRN: [id number] Date of Birth: 24-Oct-1945

## 2021-03-10 NOTE — Patient Instructions (Signed)
Access Code: [email or web address] URL: [email or web address] Date: 03/10/2021 Prepared by: Mady Haagensen  Exercises Staggered Sit-to-Stand (Mirrored) - 1 x daily - 5 x weekly - 2 sets - 5 reps Staggered Stance Forward Backward Weight Shift with Counter Support - 1 x daily - 5 x weekly - 2 sets - 10 reps Standing Marching - 1 x daily - 5 x weekly - 2 sets - 10 reps Forward Step Up - 1 x daily - 5 x weekly - 2 sets - 5 reps  Added 03/10/2021 Standing posture stretch - 1 x daily - 7 x weekly - 1 sets - 5 reps - 10-15 sec hold Seated Hamstring Stretch - 1 x daily - 7 x weekly - 1 sets - 3 reps - 30 sec hold Standing Gastroc Stretch on Step - 1 x daily - 7 x weekly - 1 sets - 3 reps - 30 sec hold

## 2021-03-16 ENCOUNTER — Other Ambulatory Visit: Payer: Self-pay

## 2021-03-16 ENCOUNTER — Ambulatory Visit: Payer: Medicare Other | Attending: Family Medicine | Admitting: Physical Therapy

## 2021-03-16 ENCOUNTER — Encounter: Payer: Self-pay | Admitting: Physical Therapy

## 2021-03-16 DIAGNOSIS — R2689 Other abnormalities of gait and mobility: Secondary | ICD-10-CM | POA: Diagnosis not present

## 2021-03-16 DIAGNOSIS — R2681 Unsteadiness on feet: Secondary | ICD-10-CM | POA: Insufficient documentation

## 2021-03-16 DIAGNOSIS — R293 Abnormal posture: Secondary | ICD-10-CM | POA: Insufficient documentation

## 2021-03-16 DIAGNOSIS — M6281 Muscle weakness (generalized): Secondary | ICD-10-CM | POA: Diagnosis not present

## 2021-03-16 DIAGNOSIS — R29818 Other symptoms and signs involving the nervous system: Secondary | ICD-10-CM | POA: Diagnosis not present

## 2021-03-16 NOTE — Therapy (Signed)
[facility] [address] [place], [place], [address] Phone: [phone]   Fax:  [phone]  Physical Therapy Treatment  Patient Details  Name: Joshua Browning MRN: [id number] Date of Birth: 10/31/1945 Referring Provider (PT): Tat, Wells Guiles   Encounter Date: 03/16/2021   PT End of Session - 03/16/21 1024     Visit Number 6    Number of Visits 17    Date for PT Re-Evaluation 04/02/21    Authorization Type UHC Medicare    PT Start Time 1020    PT Stop Time 1058    PT Time Calculation (min) 38 min    Activity Tolerance Patient tolerated treatment well    Behavior During Therapy WFL for tasks assessed/performed             Past Medical History:  Diagnosis Date   BPH (benign prostatic hyperplasia)    Foley catheter in place    removed after turp   GERD (gastroesophageal reflux disease)    Nocturnal leg cramps    occ   Urinary retention    Wears glasses     Past Surgical History:  Procedure Laterality Date   CATARACT EXTRACTION W/ INTRAOCULAR LENS  IMPLANT, BILATERAL Bilateral    INGUINAL HERNIA REPAIR Bilateral RIGHT x 2 Q000111Q umbilical done with last right   left last one done 10 yrs ago   RE-DO RIGHT INGUINAL HERNIA REPAIR AND UMBILICAL HERNIA REPAIR  2001   REMOVAL LEFT EYE INTRAOCULAR LENS AND REPLACEMENT   08-29-2013   TRANSURETHRAL RESECTION OF PROSTATE N/A 09/23/2013   Procedure: TRANSURETHRAL RESECTION OF THE PROSTATE (TURP) WITH GYRUS;  Surgeon: Claybon Jabs, MD;  Location: [facility];  Service: Urology;  Laterality: N/A;   VARICOCELECTOMY Left 07/02/2018   Procedure: VARICOCELECTOMY;  Surgeon: Kathie Rhodes, MD;  Location: [facility];  Service: Urology;  Laterality: Left;    There were no vitals filed for this visit.   Subjective Assessment - 03/16/21 1022     Subjective A little tired, given we had so much family in for the weekend.  No falls, no stumbles.     Patient Stated Goals Pt's goals for therapy are to work on my left hip/leg with walking.    Currently in Pain? No/denies                               [facility] Adult PT Treatment/Exercise - 03/16/21 0001       Transfers   Transfers Sit to Stand;Stand to Sit    Sit to Stand 6: Modified independent (Device/Increase time);Without upper extremity assist;From chair/3-in-1    Stand to Sit 6: Modified independent (Device/Increase time);Without upper extremity assist;To chair/3-in-1    Comments Performed x 5 reps, after flexibility exercises.  Cues for quad/glut activation.      Ambulation/Gait   Ambulation/Gait Yes    Ambulation/Gait Assistance 5: Supervision    Ambulation/Gait Assistance Details Gait as warm up, at beginning of session, cues for arm swing, step length, posture.    Ambulation Distance (Feet) 600 Feet   400   Assistive device None    Gait Pattern Step-through pattern;Decreased step length - left;Decreased dorsiflexion - left;Left circumduction;Left foot flat;Right foot flat;Narrow base of support;Poor foot clearance - left    Ambulation Surface Level;Indoor    Gait Comments Occasional cues to "PWR! Up" for a posture break to reset posture during gait.  Self-Care   Self-Care Other Self-Care Comments    Other Self-Care Comments  Discussed importance of schedule for medication and for HEP.  Pt reports sleeping in later and not always getting up the same time each day.  Discussed importance of Sinemet being "On time, every time" and PT recommends pt try to set a schedule for his day to include getting up near the same time.  Discussed exercise as part of schedule and provided chart for exercise tracking/scheduling.               Reviewed HEP with stretches given last visit with pt return demo understanding  Standing posture stretch - 1 x daily - 7 x weekly - 1 sets - 5 reps - 10-15 sec hold Seated Hamstring Stretch - 1 x daily - 7 x weekly - 1 sets - 3  reps - 30 sec hold Standing Gastroc Stretch on Step - 1 x daily - 7 x weekly - 1 sets - 3 reps - 30 sec hold   Pt performs PWR! Moves in standing position with chair in front, if needed for balance, but does not use.  PWR! Up for improved posture x 10  PWR! Rock for improved weighshifting x 10 reps, cues for widened BOS and looking up at hands  PWR! Twist for improved trunk rotation x 5 reps, 2 sets, cues for brief stop in midline  PWR! Step for improved step initiation x 5 reps, 2 sets, cues for step height, foot clearance.  Pt demo good understanding of standing PWR! Moves.  He has handout at home, but is not currently performing.  Requested pt add this into his exercise routine.      PT Education - 03/16/21 1054     Education Details Schedule for exercise; exercise chart    Person(s) Educated Patient    Methods Explanation;Handout    Comprehension Verbalized understanding              PT Short Term Goals - 03/08/21 1328       PT SHORT TERM GOAL #1   Title Pt will be independent with HEP for improved balance, posture, functional mobility.  03/05/2021    Baseline independent with initial HEP    Time 4    Period Weeks    Status Achieved      PT SHORT TERM GOAL #2   Title Pt will improve 5x sit<>stand to less than or equal to 12.5 seconds for improved transfer efficiency and safety.    Baseline 14.79 sec; 12.78 sec 03/08/21    Time 4    Period Weeks    Status Achieved      PT SHORT TERM GOAL #3   Title Pt will improve posterior step and release test to 2 steps or less with independent balance recovery, to demonstrate improved balance recovery in posterior direction.    Baseline 1 step to recover posterior direction 03/08/2021    Time 4    Period Weeks    Status Achieved      PT SHORT TERM GOAL #4   Title Pt will improveTUG score to less than or equal to 13.5 sec for decreased fall risk.    Baseline 14.54, 11.19 sec 03/08/2021    Time 4    Period Weeks    Status  Achieved               PT Long Term Goals - 02/01/21 1530       PT LONG TERM GOAL #  1   Title Pt will be independent with progression of HEP to address strength, balance, posture, and mobility.  TARGET 04/02/2021    Time 8    Period Weeks    Status New      PT LONG TERM GOAL #2   Title Pt will improve gait velocity to at least 2.62 ft/sec for improved gait efficiency.    Baseline 2.2 ft/sec    Time 8    Period Weeks    Status New      PT LONG TERM GOAL #3   Title Pt will improve MiniBESTest score to at least 22/28 for decreased fall risk.    Time 8    Period Weeks    Status New      PT LONG TERM GOAL #4   Title Pt will verbalize understanding of transition to appropriate community fitness classes upon d/c from PT to maximize gains made in therapy.    Time 8    Period Weeks    Status New                   Plan - 03/16/21 1239     Clinical Impression Statement Initially in session, pt reports fatigue and tiredness, but reports feeling better overall with exercises in session.  PT used this example as part of education reiterating importance of consistent, regular exercise in his routine.  Reviewed stretches added to HEP last visit, with pt return demo understanding.  Performed standing PWR! Moves, which pt has had previously and has copies of at home; pt performs well and he would benefit from adding into his HEP routine.  Overall, his HEP routine and schedule became interuppted with him having Covid last month and he is open to suggestions to get back into a routine for exercise.  He will continue to benefit from skilled PT to further address posture, balance, functional strength for improved overall mobility and decreased fall risk.    Personal Factors and Comorbidities Comorbidity 3+    Comorbidities BPH, GERD, cystoid macular edema.  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    Examination-Activity Limitations  Transfers;Stand;Locomotion Level    Examination-Participation Restrictions Community Activity;[employer] activities, community fitness classes   Stability/Clinical Decision Making Evolving/Moderate complexity    Rehab Potential Good    PT Frequency 2x / week    PT Duration 8 weeks   plus eval   PT Treatment/Interventions ADLs/Self Care Home Management;Gait training;Functional mobility training;Therapeutic activities;Therapeutic exercise;Balance training;Neuromuscular re-education;DME Instruction;Patient/family education    PT Next Visit Plan Perform seated and standing  PWR! Moves exercises and discuss at some point transition to [facility]! Moves class and continue to reintroduce PWR moves..  Work on gait and postural stability, posterior balance recovery    Consulted and Agree with Plan of Care Patient             Patient will benefit from skilled therapeutic intervention in order to improve the following deficits and impairments:  Abnormal gait, Difficulty walking, Decreased balance, Decreased mobility, Decreased strength, Postural dysfunction, Impaired flexibility  Visit Diagnosis: Other abnormalities of gait and mobility  Abnormal posture  Muscle weakness (generalized)  Unsteadiness on feet     Problem List Patient Active Problem List   Diagnosis Date Noted   BPH (benign prostatic hypertrophy) with urinary retention 09/23/2013    Almir Botts W. 03/16/2021, 12:45 PM Frazier Butt., PT   [facility] [address]  [place], [place], [address] Phone: [phone]   Fax:  [phone]  Name: Joshua Browning MRN: [id number] Date of Birth: May 13, 1946

## 2021-03-16 NOTE — Patient Instructions (Addendum)
(  Exercise) Monday Tuesday Wednesday Thursday Friday Saturday Sunday    Standing Exercises           Stretches             PWR! Moves Standing            PWR! Moves Sitting            Walking

## 2021-03-17 ENCOUNTER — Encounter: Payer: Self-pay | Admitting: Physical Therapy

## 2021-03-17 ENCOUNTER — Ambulatory Visit: Payer: Medicare Other | Admitting: Physical Therapy

## 2021-03-17 DIAGNOSIS — R293 Abnormal posture: Secondary | ICD-10-CM | POA: Diagnosis not present

## 2021-03-17 DIAGNOSIS — R2681 Unsteadiness on feet: Secondary | ICD-10-CM | POA: Diagnosis not present

## 2021-03-17 DIAGNOSIS — M6281 Muscle weakness (generalized): Secondary | ICD-10-CM | POA: Diagnosis not present

## 2021-03-17 DIAGNOSIS — R29818 Other symptoms and signs involving the nervous system: Secondary | ICD-10-CM | POA: Diagnosis not present

## 2021-03-17 DIAGNOSIS — R2689 Other abnormalities of gait and mobility: Secondary | ICD-10-CM | POA: Diagnosis not present

## 2021-03-17 NOTE — Therapy (Addendum)
[facility] [address] [place], [place], [address] Phone: [phone]   Fax:  [phone]  Physical Therapy Treatment  Patient Details  Name: Joshua Browning MRN: [id number] Date of Birth: 1946/06/09 Referring Provider (PT): Tat, Wells Guiles   Encounter Date: 03/17/2021   PT End of Session - 03/17/21 1744     Visit Number 7    Number of Visits 17    Date for PT Re-Evaluation 04/02/21    Authorization Type [employer] Medicare    PT Start Time 1704    PT Stop Time 1745    PT Time Calculation (min) 41 min    Activity Tolerance Patient tolerated treatment well    Behavior During Therapy WFL for tasks assessed/performed             Past Medical History:  Diagnosis Date   BPH (benign prostatic hyperplasia)    Foley catheter in place    removed after turp   GERD (gastroesophageal reflux disease)    Nocturnal leg cramps    occ   Urinary retention    Wears glasses     Past Surgical History:  Procedure Laterality Date   CATARACT EXTRACTION W/ INTRAOCULAR LENS  IMPLANT, BILATERAL Bilateral    INGUINAL HERNIA REPAIR Bilateral RIGHT x 2 Q000111Q umbilical done with last right   left last one done 10 yrs ago   RE-DO RIGHT INGUINAL HERNIA REPAIR AND UMBILICAL HERNIA REPAIR  2001   REMOVAL LEFT EYE INTRAOCULAR LENS AND REPLACEMENT   08-29-2013   TRANSURETHRAL RESECTION OF PROSTATE N/A 09/23/2013   Procedure: TRANSURETHRAL RESECTION OF THE PROSTATE (TURP) WITH GYRUS;  Surgeon: Claybon Jabs, MD;  Location: [facility];  Service: Urology;  Laterality: N/A;   VARICOCELECTOMY Left 07/02/2018   Procedure: VARICOCELECTOMY;  Surgeon: Kathie Rhodes, MD;  Location: [facility];  Service: Urology;  Laterality: Left;    There were no vitals filed for this visit.   Subjective Assessment - 03/17/21 1704     Subjective No changes since yesterday.    Patient Stated Goals Pt's goals for therapy are to work on my  left hip/leg with walking.    Currently in Pain? No/denies                               [facility] Adult PT Treatment/Exercise - 03/17/21 0001       Transfers   Transfers Sit to Stand;Stand to Sit    Sit to Stand 6: Modified independent (Device/Increase time);Without upper extremity assist;From chair/3-in-1    Stand to Sit 6: Modified independent (Device/Increase time);Without upper extremity assist;To chair/3-in-1      Ambulation/Gait   Ambulation/Gait Yes    Ambulation/Gait Assistance 5: Supervision    Ambulation Distance (Feet) 600 Feet    Assistive device None    Gait Pattern Step-through pattern;Decreased step length - left;Decreased dorsiflexion - left;Left circumduction;Left foot flat;Right foot flat;Narrow base of support;Poor foot clearance - left    Ambulation Surface Level;Indoor    Gait Comments Gait at end of session with quick stop/starts, with quick changes of directions, with quick change forward/retro gait.  No LOB and pt demo good foot position with quick stops.             Pt performs PWR! Moves in sitting position    PWR! Up for improved posture x 10   PWR! Rock for improved weighshifting x 10 reps, cues  for hand placement/big reach and looking up at hands   PWR! Twist for improved trunk rotation x 5 reps, 2 sets, cues for brief stop in midline   PWR! Step for improved step initiation x 10 reps, initial cues for step height, foot clearance.     Pt performs PWR! Moves in standing position with chair in front, if needed for balance, but does not use.   PWR! Up for improved posture x 10   PWR! Rock for improved weighshifting x 10 reps, cues for widened BOS and looking up at hands.  Cues for upright through trunk to avoid lateral trunk lean (near LOB to L side with rock and reach)   PWR! Twist for improved trunk rotation x 5 reps, 2 sets, cues for brief stop in midline   PWR! Step for improved step initiation x 5 reps, 2 sets, cues for  step height, foot clearance.  Cues for brief stop in midline to maintain balance.   Pt demo good understanding of standing PWR! Moves.  He has handouts at home and PT requested pt add these into his exercise routine.    Balance Exercises - 03/17/21 0001       Balance Exercises: Standing   Stepping Strategy Posterior;UE support;10 reps;Limitations    Stepping Strategy Limitations 2 sets, 1 set with UE support, 2nd set with no UE support.    Other Standing Exercises Side/diagonal step out and in (single leg), like initiating a turn, 5 reps each side with UE support, progressing to using this step from counter to mat to initiate quarter turn and short distance gait. At least 5 reps to R and L with cues and supervision.               PT Education - 03/17/21 1834     Education Details Adding in PWR! Moves seated and standing (he already has the pictures) into his routine for HEP    Person(s) Educated Patient    Methods Explanation;Demonstration    Comprehension Verbalized understanding;Returned demonstration              PT Short Term Goals - 03/08/21 1328       PT SHORT TERM GOAL #1   Title Pt will be independent with HEP for improved balance, posture, functional mobility.  03/05/2021    Baseline independent with initial HEP    Time 4    Period Weeks    Status Achieved      PT SHORT TERM GOAL #2   Title Pt will improve 5x sit<>stand to less than or equal to 12.5 seconds for improved transfer efficiency and safety.    Baseline 14.79 sec; 12.78 sec 03/08/21    Time 4    Period Weeks    Status Achieved      PT SHORT TERM GOAL #3   Title Pt will improve posterior step and release test to 2 steps or less with independent balance recovery, to demonstrate improved balance recovery in posterior direction.    Baseline 1 step to recover posterior direction 03/08/2021    Time 4    Period Weeks    Status Achieved      PT SHORT TERM GOAL #4   Title Pt will improveTUG score to  less than or equal to 13.5 sec for decreased fall risk.    Baseline 14.54, 11.19 sec 03/08/2021    Time 4    Period Weeks    Status Achieved  PT Long Term Goals - 02/01/21 1530       PT LONG TERM GOAL #1   Title Pt will be independent with progression of HEP to address strength, balance, posture, and mobility.  TARGET 04/02/2021    Time 8    Period Weeks    Status New      PT LONG TERM GOAL #2   Title Pt will improve gait velocity to at least 2.62 ft/sec for improved gait efficiency.    Baseline 2.2 ft/sec    Time 8    Period Weeks    Status New      PT LONG TERM GOAL #3   Title Pt will improve MiniBESTest score to at least 22/28 for decreased fall risk.    Time 8    Period Weeks    Status New      PT LONG TERM GOAL #4   Title Pt will verbalize understanding of transition to appropriate community fitness classes upon d/c from PT to maximize gains made in therapy.    Time 8    Period Weeks    Status New                   Plan - 03/17/21 1835     Clinical Impression Statement Focus of today's skilled PT session on performance/review of PWR! Moves in sitting and standing positions.  Pt overall has a good handle on technique, just needs some reminder cues.  He does have some mild loss of balance with trunk lean in standing PWR! Step (to the left).  Addressed this with step strategy work in lateral, posterior and posterior lateral directions.  He will continue to benefit from skilled PT to fruther address balance, posture, gait towards LTGs.    Personal Factors and Comorbidities Comorbidity 3+    Comorbidities BPH, GERD, cystoid macular edema.  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    Examination-Activity Limitations Transfers;Stand;Locomotion Level    Examination-Participation Restrictions Community Activity;[employer] activities, community fitness classes   Stability/Clinical Decision  Making Evolving/Moderate complexity    Rehab Potential Good    PT Frequency 2x / week    PT Duration 8 weeks   plus eval   PT Treatment/Interventions ADLs/Self Care Home Management;Gait training;Functional mobility training;Therapeutic activities;Therapeutic exercise;Balance training;Neuromuscular re-education;DME Instruction;Patient/family education    PT Next Visit Plan Review seated and standing  PWR! Moves exercises.  Discuss at some point transition to [facility]! Moves class (I didn't get a chance to do this) and could work on other positions if he thinks he may try the class.  Continue to work on gait, postural stability, posterior/posterior-lateral balance recovery.    Consulted and Agree with Plan of Care Patient             Patient will benefit from skilled therapeutic intervention in order to improve the following deficits and impairments:  Abnormal gait, Difficulty walking, Decreased balance, Decreased mobility, Decreased strength, Postural dysfunction, Impaired flexibility  Visit Diagnosis: Other abnormalities of gait and mobility  Unsteadiness on feet  Abnormal posture     Problem List Patient Active Problem List   Diagnosis Date Noted   BPH (benign prostatic hypertrophy) with urinary retention 09/23/2013    Carlee Vonderhaar W. 03/17/2021, 6:39 PM Frazier Butt., PT   [facility] [address] [place], [place], [address] Phone: [phone]   Fax:  [phone]  Name: Joshua Browning MRN: [id number] Date of  Birth: 06-18-1946

## 2021-03-23 ENCOUNTER — Other Ambulatory Visit: Payer: Self-pay

## 2021-03-23 ENCOUNTER — Other Ambulatory Visit: Payer: Self-pay | Admitting: Neurology

## 2021-03-23 ENCOUNTER — Ambulatory Visit: Payer: Medicare Other | Admitting: Physical Therapy

## 2021-03-23 ENCOUNTER — Encounter: Payer: Self-pay | Admitting: Physical Therapy

## 2021-03-23 DIAGNOSIS — M6281 Muscle weakness (generalized): Secondary | ICD-10-CM | POA: Diagnosis not present

## 2021-03-23 DIAGNOSIS — R293 Abnormal posture: Secondary | ICD-10-CM | POA: Diagnosis not present

## 2021-03-23 DIAGNOSIS — R29818 Other symptoms and signs involving the nervous system: Secondary | ICD-10-CM | POA: Diagnosis not present

## 2021-03-23 DIAGNOSIS — R2689 Other abnormalities of gait and mobility: Secondary | ICD-10-CM

## 2021-03-23 DIAGNOSIS — R2681 Unsteadiness on feet: Secondary | ICD-10-CM

## 2021-03-23 NOTE — Therapy (Signed)
[place] [address] [place], [place], [address] Phone: [phone]   Fax:  [phone]  Physical Therapy Treatment  Patient Details  Name: Joshua Browning MRN: [id number] Date of Birth: [birth year] Referring Provider (PT): Tat, Wells Guiles   Encounter Date: 03/23/2021   PT End of Session - 03/23/21 2103     Visit Number 8    Number of Visits 17    Date for PT Re-Evaluation 04/02/21    Authorization Type [employer] Medicare    PT Start Time 1618    PT Stop Time 1700    PT Time Calculation (min) 42 min    Activity Tolerance Patient tolerated treatment well    Behavior During Therapy [facility] for tasks assessed/performed             Past Medical History:  Diagnosis Date   BPH (benign prostatic hyperplasia)    Foley catheter in place    removed after turp   GERD (gastroesophageal reflux disease)    Nocturnal leg cramps    occ   Urinary retention    Wears glasses     Past Surgical History:  Procedure Laterality Date   CATARACT EXTRACTION W/ INTRAOCULAR LENS  IMPLANT, BILATERAL Bilateral    INGUINAL HERNIA REPAIR Bilateral RIGHT x 2 Q000111Q umbilical done with last right   left last one done 10 yrs ago   RE-DO RIGHT INGUINAL HERNIA REPAIR AND UMBILICAL HERNIA REPAIR  2001   REMOVAL LEFT EYE INTRAOCULAR LENS AND REPLACEMENT   08-29-2013   TRANSURETHRAL RESECTION OF PROSTATE N/A 09/23/2013   Procedure: TRANSURETHRAL RESECTION OF THE PROSTATE (TURP) WITH GYRUS;  Surgeon: Claybon Jabs, MD;  Location: [facility];  Service: Urology;  Laterality: N/A;   VARICOCELECTOMY Left 07/02/2018   Procedure: VARICOCELECTOMY;  Surgeon: Kathie Rhodes, MD;  Location: [facility];  Service: Urology;  Laterality: Left;    There were no vitals filed for this visit.   Subjective Assessment - 03/23/21 1619     Subjective No changes since he was last here.    Patient Stated Goals Pt's goals for therapy are to work  on my left hip/leg with walking.    Currently in Pain? No/denies                          Performs PWR! Moves in standing position with chair in front, if needed for balance, but does not use.   PWR! Up for improved posture x 10   PWR! Rock for improved weighshifting x 10 reps, cues for widened BOS and looking up at hands. Cues to reach up towards ceiling vs. Laterally.    PWR! Twist for improved trunk rotation x 5 reps, 2 sets, cues for brief stop in midline   PWR! Step for improved step initiation x 5 reps,cues for step height, foot clearance.  Cues for brief stop in midline  and to PWR up to maintain balance. Performed an additional x5 reps B with 2" foam beam for foot clearance   Pt performs PWR! Moves in modified quadraped position at countertop:   PWR! Up for improved posture x10 reps   PWR! Rock for improved weighshifting x10 reps   PWR! Twist for improved trunk rotation x5 reps B  PWR! Step for improved step initiation x10 reps B  Cues provided for technique and intensity of movement.       [facility] Adult PT Treatment/Exercise - 03/23/21 1627  Ambulation/Gait   Ambulation/Gait Yes    Ambulation/Gait Assistance 5: Supervision    Ambulation/Gait Assistance Details at start of session, cues for step length and posture    Ambulation Distance (Feet) 345 Feet    Assistive device None    Gait Pattern Step-through pattern;Decreased step length - left;Decreased dorsiflexion - left;Left circumduction;Left foot flat;Right foot flat;Narrow base of support;Poor foot clearance - left    Ambulation Surface Level;Indoor    Gait Comments perfomed x5 reps wide BOS/ diagonal turns with LLE to simulate pt making these turns in his kitchen and feeling off balance with more narrow BOS, will continue to practice in session      Self-Care   Self-Care --    Other Self-Care Comments  --      Therapeutic Activites    Therapeutic Activities Other Therapeutic Activities     Other Therapeutic Activities discussed PWR moves exercise classes to see if pt would be interested in participating, pt reports that he would be interested. discussed that during sessions will start to incorporate other variations of PWR moves that pt might be performing in the class. also discussed criteria to participate in class (will need to further assess floor transfers). pt reports that he can't lie supine on his back for exercises due to it making him nauseous, PT will talk to PT that runs PWR moves classes about ways he can modify these in the class                 Balance Exercises - 03/23/21 0001       Balance Exercises: Standing   Stepping Strategy Posterior;UE support;10 reps;Limitations    Stepping Strategy Limitations with random perturbations at hip to ellicit a stepping strategy in the posterior direction, working on taking 1 robust step               PT Education - 03/23/21 2103     Education Details see TA    Person(s) Educated Patient    Methods Explanation    Comprehension Verbalized understanding              PT Short Term Goals - 03/08/21 1328       PT SHORT TERM GOAL #1   Title Pt will be independent with HEP for improved balance, posture, functional mobility.  03/05/2021    Baseline independent with initial HEP    Time 4    Period Weeks    Status Achieved      PT SHORT TERM GOAL #2   Title Pt will improve 5x sit<>stand to less than or equal to 12.5 seconds for improved transfer efficiency and safety.    Baseline 14.79 sec; 12.78 sec 03/08/21    Time 4    Period Weeks    Status Achieved      PT SHORT TERM GOAL #3   Title Pt will improve posterior step and release test to 2 steps or less with independent balance recovery, to demonstrate improved balance recovery in posterior direction.    Baseline 1 step to recover posterior direction 03/08/2021    Time 4    Period Weeks    Status Achieved      PT SHORT TERM GOAL #4   Title Pt will  improveTUG score to less than or equal to 13.5 sec for decreased fall risk.    Baseline 14.54, 11.19 sec 03/08/2021    Time 4    Period Weeks    Status Achieved  PT Long Term Goals - 02/01/21 1530       PT LONG TERM GOAL #1   Title Pt will be independent with progression of HEP to address strength, balance, posture, and mobility.  TARGET 04/02/2021    Time 8    Period Weeks    Status New      PT LONG TERM GOAL #2   Title Pt will improve gait velocity to at least 2.62 ft/sec for improved gait efficiency.    Baseline 2.2 ft/sec    Time 8    Period Weeks    Status New      PT LONG TERM GOAL #3   Title Pt will improve MiniBESTest score to at least 22/28 for decreased fall risk.    Time 8    Period Weeks    Status New      PT LONG TERM GOAL #4   Title Pt will verbalize understanding of transition to appropriate community fitness classes upon d/c from PT to maximize gains made in therapy.    Time 8    Period Weeks    Status New                   Plan - 03/23/21 2108     Clinical Impression Statement Reviewed standing PWR moves with pt needing brief reminder cues to reset with tall posture in midline and for balance. Also initiated modified quadraped PWR moves at the countertop today to introduce other positions that pt would perform in the PWR moves exercise class (pt reporting that he would be intersted in pursuing these classes). Will continue to progress towards LTGs.    Personal Factors and Comorbidities Comorbidity 3+    Comorbidities BPH, GERD, cystoid macular edema.  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    Examination-Activity Limitations Transfers;Stand;Locomotion Level    Examination-Participation Restrictions Community Activity;[employer] activities, community fitness classes   Stability/Clinical Decision Making Evolving/Moderate complexity    Rehab Potential Good    PT Frequency  2x / week    PT Duration 8 weeks   plus eval   PT Treatment/Interventions ADLs/Self Care Home Management;Gait training;Functional mobility training;Therapeutic activities;Therapeutic exercise;Balance training;Neuromuscular re-education;DME Instruction;Patient/family education    PT Next Visit Plan WIDE TURNS. Review seated and standing  PWR! Moves exercises.  try other PWR moves position for class and give pt more information. try floor transfers. Continue to work on gait, postural stability, posterior/posterior-lateral balance recovery.    Consulted and Agree with Plan of Care Patient             Patient will benefit from skilled therapeutic intervention in order to improve the following deficits and impairments:  Abnormal gait, Difficulty walking, Decreased balance, Decreased mobility, Decreased strength, Postural dysfunction, Impaired flexibility  Visit Diagnosis: Other abnormalities of gait and mobility  Unsteadiness on feet  Abnormal posture  Muscle weakness (generalized)     Problem List Patient Active Problem List   Diagnosis Date Noted   BPH (benign prostatic hypertrophy) with urinary retention 09/23/2013    Arliss Journey, PT, DPT  03/23/2021, 9:09 PM  [place] [address] [place], [place], [address] Phone: [phone]   Fax:  [phone]  Name: Joshua Browning MRN: [id number] Date of Birth: [birth year]

## 2021-03-26 ENCOUNTER — Other Ambulatory Visit: Payer: Self-pay

## 2021-03-26 ENCOUNTER — Ambulatory Visit: Payer: Medicare Other | Admitting: Physical Therapy

## 2021-03-26 ENCOUNTER — Encounter: Payer: Self-pay | Admitting: Physical Therapy

## 2021-03-26 DIAGNOSIS — M6281 Muscle weakness (generalized): Secondary | ICD-10-CM

## 2021-03-26 DIAGNOSIS — R2689 Other abnormalities of gait and mobility: Secondary | ICD-10-CM | POA: Diagnosis not present

## 2021-03-26 DIAGNOSIS — R29818 Other symptoms and signs involving the nervous system: Secondary | ICD-10-CM | POA: Diagnosis not present

## 2021-03-26 DIAGNOSIS — R293 Abnormal posture: Secondary | ICD-10-CM

## 2021-03-26 DIAGNOSIS — R2681 Unsteadiness on feet: Secondary | ICD-10-CM

## 2021-03-26 NOTE — Therapy (Signed)
[facility] [address] [place], [place], [address] Phone: [phone]   Fax:  [phone]  Physical Therapy Treatment  Patient Details  Name: Joshua Browning MRN: [id number] Date of Birth: 09-03-1945 Referring Provider (PT): Tat, Wells Guiles   Encounter Date: 03/26/2021   PT End of Session - 03/26/21 0850     Visit Number 9    Number of Visits 17    Date for PT Re-Evaluation 04/02/21    Authorization Type [employer] Medicare    PT Start Time 0848    PT Stop Time 0930    PT Time Calculation (min) 42 min    Activity Tolerance Patient tolerated treatment well    Behavior During Therapy [facility] for tasks assessed/performed             Past Medical History:  Diagnosis Date   BPH (benign prostatic hyperplasia)    Foley catheter in place    removed after turp   GERD (gastroesophageal reflux disease)    Nocturnal leg cramps    occ   Urinary retention    Wears glasses     Past Surgical History:  Procedure Laterality Date   CATARACT EXTRACTION W/ INTRAOCULAR LENS  IMPLANT, BILATERAL Bilateral    INGUINAL HERNIA REPAIR Bilateral RIGHT x 2 Q000111Q umbilical done with last right   left last one done 10 yrs ago   RE-DO RIGHT INGUINAL HERNIA REPAIR AND UMBILICAL HERNIA REPAIR  2001   REMOVAL LEFT EYE INTRAOCULAR LENS AND REPLACEMENT   08-29-2013   TRANSURETHRAL RESECTION OF PROSTATE N/A 09/23/2013   Procedure: TRANSURETHRAL RESECTION OF THE PROSTATE (TURP) WITH GYRUS;  Surgeon: Claybon Jabs, MD;  Location: [facility];  Service: Urology;  Laterality: N/A;   VARICOCELECTOMY Left 07/02/2018   Procedure: VARICOCELECTOMY;  Surgeon: Kathie Rhodes, MD;  Location: [facility];  Service: Urology;  Laterality: Left;    There were no vitals filed for this visit.   Subjective Assessment - 03/26/21 0850     Subjective No changes since last time, no falls.    Patient Stated Goals Pt's goals for therapy are to  work on my left hip/leg with walking.    Currently in Pain? No/denies                               [facility] Adult PT Treatment/Exercise - 03/26/21 0854       Ambulation/Gait   Ambulation/Gait Yes    Ambulation/Gait Assistance 5: Supervision    Ambulation/Gait Assistance Details cues for stride length initially    Assistive device None    Gait Pattern Step-through pattern;Decreased step length - left;Decreased dorsiflexion - left;Left circumduction;Left foot flat;Right foot flat;Narrow base of support;Poor foot clearance - left    Ambulation Surface Level;Indoor    Gait Comments --      Therapeutic Activites    Therapeutic Activities Other Therapeutic Activities    Other Therapeutic Activities discussed pt having 2 more appts scheduled next week and pt has missed 2 weeks due to COVID, discussed adding these at the end and would need to check goals and extend POC next wee, pt to check his schedule and see about adding these or pt reports he might be ready to wrap up at end of next week. pt to think about it over the weekend and will further discuss on monday      Knee/Hip Exercises: Aerobic   Stepper SciFit,  Level 2.3, legs x 5 minutes, cues for steps/min >80 for improved flexibility and strength                 Balance Exercises - 03/26/21 0001       Balance Exercises: Standing   Stepping Strategy Posterior;10 reps;Limitations    Stepping Strategy Limitations without UE support, alternating, initial cues for technique    Other Standing Exercises with BUE support: x10 reps heel toe raises, no LOB in posterior direction    Other Standing Exercises Comments working on wide BOS turns when going around corners as if to mimic pt's turns in his kitchen (working on more of a diagonal/wide turn with outside foot when rounding corner to prevent narrow BOS and maintaining larger steps, performed x7 reps turning to L and x7 reps turning to R             Pt  performs PWR! Moves in modified quadraped position at countertop:   PWR! Up for improved posture x10 reps    PWR! Rock for improved weighshifting x10 reps    PWR! Twist for improved trunk rotation x5 reps B - cues for alternating UE when performing    PWR! Step for improved step initiation x5 reps B, cues for incr step height, esp when back to midline   Cues provided for technique and intensity of movement.    PT Education - 03/26/21 1102     Education Details see TA    Person(s) Educated Patient    Methods Explanation    Comprehension Verbalized understanding              PT Short Term Goals - 03/08/21 1328       PT SHORT TERM GOAL #1   Title Pt will be independent with HEP for improved balance, posture, functional mobility.  03/05/2021    Baseline independent with initial HEP    Time 4    Period Weeks    Status Achieved      PT SHORT TERM GOAL #2   Title Pt will improve 5x sit<>stand to less than or equal to 12.5 seconds for improved transfer efficiency and safety.    Baseline 14.79 sec; 12.78 sec 03/08/21    Time 4    Period Weeks    Status Achieved      PT SHORT TERM GOAL #3   Title Pt will improve posterior step and release test to 2 steps or less with independent balance recovery, to demonstrate improved balance recovery in posterior direction.    Baseline 1 step to recover posterior direction 03/08/2021    Time 4    Period Weeks    Status Achieved      PT SHORT TERM GOAL #4   Title Pt will improveTUG score to less than or equal to 13.5 sec for decreased fall risk.    Baseline 14.54, 11.19 sec 03/08/2021    Time 4    Period Weeks    Status Achieved               PT Long Term Goals - 02/01/21 1530       PT LONG TERM GOAL #1   Title Pt will be independent with progression of HEP to address strength, balance, posture, and mobility.  TARGET 04/02/2021    Time 8    Period Weeks    Status New      PT LONG TERM GOAL #2   Title Pt will improve gait  velocity to at  least 2.62 ft/sec for improved gait efficiency.    Baseline 2.2 ft/sec    Time 8    Period Weeks    Status New      PT LONG TERM GOAL #3   Title Pt will improve MiniBESTest score to at least 22/28 for decreased fall risk.    Time 8    Period Weeks    Status New      PT LONG TERM GOAL #4   Title Pt will verbalize understanding of transition to appropriate community fitness classes upon d/c from PT to maximize gains made in therapy.    Time 8    Period Weeks    Status New                   Plan - 03/26/21 1110     Clinical Impression Statement Continued to work on wider BOS/diagonal turns when rounding corners to mimic pt's turns that he makes in his kitchen as pt currently performs with too narrow of a BOS and almost bumping into the wall at hme. Pt with improvement in technique with larger steps and wider BOS when turning with incr reps. Without UE support, pt able to perform a posterior stepping strategy with one robust step without losing his balance. Will continue to progess towards LTGs.    Personal Factors and Comorbidities Comorbidity 3+    Comorbidities BPH, GERD, cystoid macular edema.  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    Examination-Activity Limitations Transfers;Stand;Locomotion Level    Examination-Participation Restrictions Community Activity;[employer] activities, community fitness classes   Stability/Clinical Decision Making Evolving/Moderate complexity    Rehab Potential Good    PT Frequency 2x / week    PT Duration 8 weeks   plus eval   PT Treatment/Interventions ADLs/Self Care Home Management;Gait training;Functional mobility training;Therapeutic activities;Therapeutic exercise;Balance training;Neuromuscular re-education;DME Instruction;Patient/family education    PT Next Visit Plan 10TH VISIT PROGRESS NOTE! continue to work on wide TURNS.  Review seated and standing  PWR! Moves  exercises as appropriate.  try other PWR moves position for class and give pt more information. try floor transfers. Continue to work on gait, postural stability, posterior/posterior-lateral balance recovery.    Consulted and Agree with Plan of Care Patient             Patient will benefit from skilled therapeutic intervention in order to improve the following deficits and impairments:  Abnormal gait, Difficulty walking, Decreased balance, Decreased mobility, Decreased strength, Postural dysfunction, Impaired flexibility  Visit Diagnosis: Other abnormalities of gait and mobility  Abnormal posture  Unsteadiness on feet  Muscle weakness (generalized)     Problem List Patient Active Problem List   Diagnosis Date Noted   BPH (benign prostatic hypertrophy) with urinary retention 09/23/2013    Arliss Journey, PT, DPT  03/26/2021, 11:12 AM  [facility] [address] [place], [place], [address] Phone: [phone]   Fax:  [phone]  Name: Joshua Browning MRN: [id number] Date of Birth: July 25, 1946

## 2021-03-29 ENCOUNTER — Other Ambulatory Visit: Payer: Self-pay

## 2021-03-29 ENCOUNTER — Encounter: Payer: Self-pay | Admitting: Physical Therapy

## 2021-03-29 ENCOUNTER — Ambulatory Visit: Payer: Medicare Other | Admitting: Physical Therapy

## 2021-03-29 DIAGNOSIS — R2681 Unsteadiness on feet: Secondary | ICD-10-CM

## 2021-03-29 DIAGNOSIS — M6281 Muscle weakness (generalized): Secondary | ICD-10-CM | POA: Diagnosis not present

## 2021-03-29 DIAGNOSIS — R2689 Other abnormalities of gait and mobility: Secondary | ICD-10-CM | POA: Diagnosis not present

## 2021-03-29 DIAGNOSIS — R29818 Other symptoms and signs involving the nervous system: Secondary | ICD-10-CM | POA: Diagnosis not present

## 2021-03-29 DIAGNOSIS — R293 Abnormal posture: Secondary | ICD-10-CM | POA: Diagnosis not present

## 2021-03-29 NOTE — Therapy (Signed)
[facility] [address] [place], [place], [address] Phone: [phone]   Fax:  [phone]  Physical Therapy Treatment/10th Visit Progress Note  Patient Details  Name: Joshua Browning MRN: [id number] Date of Birth: 1946-03-05 Referring Provider (PT): Alonza Bogus  10th Visit Physical Therapy Progress Note  Dates of Reporting Period: 02/01/21 to 03/29/21   Encounter Date: 03/29/2021   PT End of Session - 03/29/21 1641     Visit Number 10    Number of Visits 17    Date for PT Re-Evaluation 04/02/21    Authorization Type [employer] Medicare    PT Start Time 1448    PT Stop Time 1530    PT Time Calculation (min) 42 min    Activity Tolerance Patient tolerated treatment well    Behavior During Therapy [facility] for tasks assessed/performed             Past Medical History:  Diagnosis Date   BPH (benign prostatic hyperplasia)    Foley catheter in place    removed after turp   GERD (gastroesophageal reflux disease)    Nocturnal leg cramps    occ   Urinary retention    Wears glasses     Past Surgical History:  Procedure Laterality Date   CATARACT EXTRACTION W/ INTRAOCULAR LENS  IMPLANT, BILATERAL Bilateral    INGUINAL HERNIA REPAIR Bilateral RIGHT x 2 6962 umbilical done with last right   left last one done 10 yrs ago   RE-DO RIGHT INGUINAL HERNIA REPAIR AND UMBILICAL HERNIA REPAIR  2001   REMOVAL LEFT EYE INTRAOCULAR LENS AND REPLACEMENT   08-29-2013   TRANSURETHRAL RESECTION OF PROSTATE N/A 09/23/2013   Procedure: TRANSURETHRAL RESECTION OF THE PROSTATE (TURP) WITH GYRUS;  Surgeon: Claybon Jabs, MD;  Location: [facility];  Service: Urology;  Laterality: N/A;   VARICOCELECTOMY Left 07/02/2018   Procedure: VARICOCELECTOMY;  Surgeon: Kathie Rhodes, MD;  Location: [facility];  Service: Urology;  Laterality: Left;    There were no vitals filed for this visit.   Subjective Assessment -  03/29/21 1450     Subjective No changes since he was last here.Has been working on the wider turns at home and they have gotten better.    Patient Stated Goals Pt's goals for therapy are to work on my left hip/leg with walking.    Currently in Pain? No/denies                [facility] PT Assessment - 03/29/21 1455       Mini-BESTest   Sit To Stand Normal: Comes to stand without use of hands and stabilizes independently.    Rise to Toes Moderate: Heels up, but not full range (smaller than when holding hands), OR noticeable instability for 3 s.    Stand on one leg (left) Moderate: < 20 s   2 sec, 10.72 sec   Stand on one leg (right) Moderate: < 20 s    Stand on one leg - lowest score 1    Compensatory Stepping Correction - Forward Normal: Recovers independently with a single, large step (second realignement is allowed).    Compensatory Stepping Correction - Backward Normal: Recovers independently with a single, large step    Compensatory Stepping Correction - Left Lateral Normal: Recovers independently with 1 step (crossover or lateral OK)    Compensatory Stepping Correction - Right Lateral Moderate: Several steps to recover equilibrium    Stepping Corredtion Lateral - lowest  score 1    Stance - Feet together, eyes open, firm surface  Normal: 30s    Stance - Feet together, eyes closed, foam surface  Normal: 30s    Incline - Eyes Closed Normal: Stands independently 30s and aligns with gravity    Change in Gait Speed Normal: Significantly changes walkling speed without imbalance    Walk with head turns - Horizontal Moderate: performs head turns with reduction in gait speed.    Walk with pivot turns Moderate:Turns with feet close SLOW (>4 steps) with good balance.    Step over obstacles Normal: Able to step over box with minimal change of gait speed and with good balance.    Timed UP & GO with Dual Task Moderate: Dual Task affects either counting OR walking (>10%) when compared to the TUG  without Dual Task.    Mini-BEST total score 22      Timed Up and Go Test   Normal TUG (seconds) 9.56    Cognitive TUG (seconds) 11.81                           [facility] Adult PT Treatment/Exercise - 03/29/21 1523       Ambulation/Gait   Ambulation/Gait Yes    Ambulation/Gait Assistance 5: Supervision    Ambulation/Gait Assistance Details cues for stride length    Ambulation Distance (Feet) 345 Feet    Assistive device None    Gait Pattern Step-through pattern;Decreased step length - left;Decreased dorsiflexion - left;Left circumduction;Left foot flat;Right foot flat;Narrow base of support;Poor foot clearance - left    Ambulation Surface Level;Indoor    Gait velocity 10.97 seconds = 2.98 ft/sec      Therapeutic Activites    Therapeutic Activities Other Therapeutic Activities    Other Therapeutic Activities pt reporting interest in PWR moves classes - provided handout on dates and for information and discussed that pt can shadow a session before participating with pt planning on doing this on wednesday, discussed can modify supine position (as pt reports feeling nauseous in this position) in sitting                    PT Education - 03/29/21 1640     Education Details see TA, progress towards goals, will add 2 additional appts next week to finalize HEP and community program before D/C    Person(s) Educated Patient    Methods Explanation;Handout    Comprehension Verbalized understanding              PT Short Term Goals - 03/08/21 1328       PT SHORT TERM GOAL #1   Title Pt will be independent with HEP for improved balance, posture, functional mobility.  03/05/2021    Baseline independent with initial HEP    Time 4    Period Weeks    Status Achieved      PT SHORT TERM GOAL #2   Title Pt will improve 5x sit<>stand to less than or equal to 12.5 seconds for improved transfer efficiency and safety.    Baseline 14.79 sec; 12.78 sec 03/08/21    Time 4     Period Weeks    Status Achieved      PT SHORT TERM GOAL #3   Title Pt will improve posterior step and release test to 2 steps or less with independent balance recovery, to demonstrate improved balance recovery in posterior direction.    Baseline 1 step to  recover posterior direction 03/08/2021    Time 4    Period Weeks    Status Achieved      PT SHORT TERM GOAL #4   Title Pt will improveTUG score to less than or equal to 13.5 sec for decreased fall risk.    Baseline 14.54, 11.19 sec 03/08/2021    Time 4    Period Weeks    Status Achieved               PT Long Term Goals - 03/29/21 1520       PT LONG TERM GOAL #1   Title Pt will be independent with progression of HEP to address strength, balance, posture, and mobility.  TARGET 04/02/2021    Time 8    Period Weeks    Status New      PT LONG TERM GOAL #2   Title Pt will improve gait velocity to at least 2.62 ft/sec for improved gait efficiency.    Baseline 2.2 ft/sec; 2.98 ft/sec on 03/29/21    Time 8    Period Weeks    Status Achieved      PT LONG TERM GOAL #3   Title Pt will improve MiniBESTest score to at least 22/28 for decreased fall risk.    Baseline 22/28 on 03/29/21    Time 8    Period Weeks    Status Achieved      PT LONG TERM GOAL #4   Title Pt will verbalize understanding of transition to appropriate community fitness classes upon d/c from PT to maximize gains made in therapy.    Time 8    Period Weeks    Status New                    Plan - 03/29/21 1648     Clinical Impression Statement 10TH visit progress note: Today's skilled session focused on assessing pt's LTGs. Pt met LTG #2 and #3. Improved gait speed to 2.98 ft/sec today (previously was 2.2 ft/sec), indicating pt is an improved community ambulator. Pt improved miniBEST score to a 22/28 (initial at eval was 19/28). Pt has improved his cog TUG score to 11.81 seconds, indicating decr fall risk and improved cog dual tasking. Pt overall  reports improvements in his balance and his gait with incr stride length and better able to kick LLE through vs. Swinging it around. Pt is making excellent progress, will continue to progress towards LTGs and finalize exercise and community fitness program.    Personal Factors and Comorbidities Comorbidity 3+    Comorbidities BPH, GERD, cystoid macular edema.  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    Examination-Activity Limitations Transfers;Stand;Locomotion Level    Examination-Participation Restrictions Community Activity;[employer] activities, community fitness classes   Stability/Clinical Decision Making Evolving/Moderate complexity    Rehab Potential Good    PT Frequency 2x / week    PT Duration 8 weeks   plus eval   PT Treatment/Interventions ADLs/Self Care Home Management;Gait training;Functional mobility training;Therapeutic activities;Therapeutic exercise;Balance training;Neuromuscular re-education;DME Instruction;Patient/family education    PT Next Visit Plan will need recert, review and finalize HEP, how was shadowing PWR moves?, continue review of PWR moves, balance strategies in posterior direction, SLS.    Consulted and Agree with Plan of Care Patient             Patient will benefit from skilled therapeutic intervention in order to improve the following deficits and impairments:  Abnormal gait, Difficulty walking, Decreased balance, Decreased mobility, Decreased strength, Postural dysfunction, Impaired flexibility  Visit Diagnosis: Other abnormalities of gait and mobility  Abnormal posture  Unsteadiness on feet  Muscle weakness (generalized)     Problem List Patient Active Problem List   Diagnosis Date Noted   BPH (benign prostatic hypertrophy) with urinary retention 09/23/2013    Arliss Journey, PT, DPT 03/29/2021, 4:49 PM  [facility] [address] [place], [place], [address] Phone: [phone]   Fax:  [phone]  Name: Joshua Browning MRN: [id number] Date of Birth: Sep 22, 1945

## 2021-04-02 ENCOUNTER — Ambulatory Visit: Payer: Medicare Other | Admitting: Physical Therapy

## 2021-04-02 ENCOUNTER — Encounter: Payer: Self-pay | Admitting: Physical Therapy

## 2021-04-02 ENCOUNTER — Other Ambulatory Visit: Payer: Self-pay

## 2021-04-02 DIAGNOSIS — M6281 Muscle weakness (generalized): Secondary | ICD-10-CM

## 2021-04-02 DIAGNOSIS — R293 Abnormal posture: Secondary | ICD-10-CM | POA: Diagnosis not present

## 2021-04-02 DIAGNOSIS — R2689 Other abnormalities of gait and mobility: Secondary | ICD-10-CM

## 2021-04-02 DIAGNOSIS — R29818 Other symptoms and signs involving the nervous system: Secondary | ICD-10-CM | POA: Diagnosis not present

## 2021-04-02 DIAGNOSIS — R2681 Unsteadiness on feet: Secondary | ICD-10-CM

## 2021-04-02 NOTE — Therapy (Addendum)
[facility] [address] [place], [place], [address] Phone: [phone]   Fax:  [phone]  Physical Therapy Treatment/Re-Cert  Patient Details  Name: Joshua Browning MRN: [id number] Date of Birth: Oct 20, 1945 Referring Provider (PT): Tat, Wells Guiles   Encounter Date: 04/02/2021   PT End of Session - 04/02/21 1623     Visit Number 11    Number of Visits 17    Date for PT Re-Evaluation 02/12/15   per re-cert on 0/10/93   Authorization Type [employer] Medicare    PT Start Time 1534    PT Stop Time 1615    PT Time Calculation (min) 41 min    Activity Tolerance Patient tolerated treatment well    Behavior During Therapy WFL for tasks assessed/performed             Past Medical History:  Diagnosis Date   BPH (benign prostatic hyperplasia)    Foley catheter in place    removed after turp   GERD (gastroesophageal reflux disease)    Nocturnal leg cramps    occ   Urinary retention    Wears glasses     Past Surgical History:  Procedure Laterality Date   CATARACT EXTRACTION W/ INTRAOCULAR LENS  IMPLANT, BILATERAL Bilateral    INGUINAL HERNIA REPAIR Bilateral RIGHT x 2 2355 umbilical done with last right   left last one done 10 yrs ago   RE-DO RIGHT INGUINAL HERNIA REPAIR AND UMBILICAL HERNIA REPAIR  2001   REMOVAL LEFT EYE INTRAOCULAR LENS AND REPLACEMENT   08-29-2013   TRANSURETHRAL RESECTION OF PROSTATE N/A 09/23/2013   Procedure: TRANSURETHRAL RESECTION OF THE PROSTATE (TURP) WITH GYRUS;  Surgeon: Claybon Jabs, MD;  Location: [facility];  Service: Urology;  Laterality: N/A;   VARICOCELECTOMY Left 07/02/2018   Procedure: VARICOCELECTOMY;  Surgeon: Kathie Rhodes, MD;  Location: [facility];  Service: Urology;  Laterality: Left;    There were no vitals filed for this visit.   Subjective Assessment - 04/02/21 1537     Subjective Going to be going to the PWR class next week.    Patient  Stated Goals Pt's goals for therapy are to work on my left hip/leg with walking.    Currently in Pain? No/denies                     04/05/21 1245  Assessment  Medical Diagnosis Parkinson's disease  Referring Provider (PT) Tat, Wells Guiles  Onset Date/Surgical Date 01/19/21  Hand Dominance Right  Prior Function  Level of Independence Independent                      Performed PWR moves below in prone and quadruped position for pt to have exposure/learn different positions before pt attends PWR moves class next Wednesday. Discussed that pt can always modify supine (not performed today) in standing at wall in class and quadruped by holding onto chair (performed a few sessions ago).     Pt performs PWR! Moves in quadruped position:    PWR! Up for improved posture x10 reps   PWR! Rock for improved weighshifting x10 reps   PWR! Twist for improved trunk rotation 2 x 5 reps B, cues for alternating UE when performing.   PWR! Step for improved step initiation x5 reps B, pt improving with ROM and incr step intensity with incr reps     Pt performs PWR! Moves in prone position:  PWR! Up for improved posture x10 reps   PWR! Rock for improved weighshifting x10 reps B, cues for wider BOS on mat   PWR! Twist for improved trunk rotation - did not try this one, pt reports he does not recall them doing this one in the class, but showed pt a picture of this exercise on handout.   PWR! Step for improved step initiation x5 reps B   Verbal/demo/and tactile cues provided for technique, intensity of movement. Cues throughout for pt to count to make sure he is not holding his breath.        04/05/21 1245  Transfers  Transfers Floor to Transfer  Floor to Transfer 5: Supervision;Other (comment)  Floor to Transfer Details (indicate cue type and reason) perform x2 reps from mat table to and from floor (red mat) to simulate what pt would be doing for his PWR moves exercise  classes. pt able to perform with supervision and using UE support on mat table, initial cues for technique with half kneel position to get up and down from floor. no issues when performing       PT Short Term Goals - 03/08/21 1328       PT SHORT TERM GOAL #1   Title Pt will be independent with HEP for improved balance, posture, functional mobility.  03/05/2021    Baseline independent with initial HEP    Time 4    Period Weeks    Status Achieved      PT SHORT TERM GOAL #2   Title Pt will improve 5x sit<>stand to less than or equal to 12.5 seconds for improved transfer efficiency and safety.    Baseline 14.79 sec; 12.78 sec 03/08/21    Time 4    Period Weeks    Status Achieved      PT SHORT TERM GOAL #3   Title Pt will improve posterior step and release test to 2 steps or less with independent balance recovery, to demonstrate improved balance recovery in posterior direction.    Baseline 1 step to recover posterior direction 03/08/2021    Time 4    Period Weeks    Status Achieved      PT SHORT TERM GOAL #4   Title Pt will improveTUG score to less than or equal to 13.5 sec for decreased fall risk.    Baseline 14.54, 11.19 sec 03/08/2021    Time 4    Period Weeks    Status Achieved               PT Long Term Goals - 04/02/21 1539       PT LONG TERM GOAL #1   Title Pt will be independent with progression of HEP to address strength, balance, posture, and mobility.  TARGET 04/02/2021    Time 8    Period Weeks    Status New      PT LONG TERM GOAL #2   Title Pt will improve gait velocity to at least 2.62 ft/sec for improved gait efficiency.    Baseline 2.2 ft/sec; 2.98 ft/sec on 03/29/21    Time 8    Period Weeks    Status Achieved      PT LONG TERM GOAL #3   Title Pt will improve MiniBESTest score to at least 22/28 for decreased fall risk.    Baseline 22/28 on 03/29/21    Time 8    Period Weeks    Status Achieved      PT LONG TERM  GOAL #4   Title Pt will verbalize  understanding of transition to appropriate community fitness classes upon d/c from PT to maximize gains made in therapy.    Time 8    Period Weeks    Status New             Revised/ongoing LTGs for re-cert:    PT Long Term Goals - 04/05/21 1254       PT LONG TERM GOAL #1   Title Pt will be independent with progression of HEP to address strength, balance, posture, and mobility.  TARGET 04/19/21    Time 2    Period Weeks    Status On-going    Target Date 04/19/21      PT LONG TERM GOAL #4   Title Pt will verbalize understanding of transition to appropriate community fitness classes upon d/c from PT to maximize gains made in therapy.    Baseline pt to transition to PWR moves classes - pt to attend first class next week    Time 2    Period Weeks    Status On-going                  04/05/21 1247  Plan  Clinical Impression Statement Today's skilled session focused on intiating PWR moves in prone and quadraped for education on how to perform different variations/positions as pt will be beginning PWR moves classes next week. Pt did well and picked up with incr reps. Pt already familiar with seated, standing, and modified quadraped PWR moves at the countertop. Also performed floor<>stand transfers with pt able to perform with UE support and supervision. Pt met LTGs at last session in regards to miniBEST and gait speed. Discussed will perform re-cert for 2 additional appts to get pt comfortable with PWR moves classes and review his HEP/community program for D/C. Will then schedule pt for a return eval in 6 months. Pt in agreement with plan. LTGs revised/updated as appropriate.  Personal Factors and Comorbidities Comorbidity 3+  Comorbidities BPH, GERD, cystoid macular edema.  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  Examination-Activity Limitations Transfers;Stand;Locomotion Level  Examination-Participation Restrictions Community  Activity;[employer];Other (Household activities, community fitness classes)  Pt will benefit from skilled therapeutic intervention in order to improve on the following deficits Abnormal gait;Difficulty walking;Decreased balance;Decreased mobility;Decreased strength;Postural dysfunction;Impaired flexibility  Stability/Clinical Decision Making Evolving/Moderate complexity  Rehab Potential Good  PT Frequency 2x / week  PT Duration 2 weeks  PT Treatment/Interventions ADLs/Self Care Home Management;Gait training;Functional mobility training;Therapeutic activities;Therapeutic exercise;Balance training;Neuromuscular re-education;DME Instruction;Patient/family education  PT Next Visit Plan answer any questions pt has about PWR moves and review any others before class. finalize pt's HEP for D/C at end of this week. balance strategies in posterior direction, SLS.  Consulted and Agree with Plan of Care Patient     Patient will benefit from skilled therapeutic intervention in order to improve the following deficits and impairments:     Visit Diagnosis: Other abnormalities of gait and mobility  Abnormal posture  Unsteadiness on feet  Muscle weakness (generalized)     Problem List Patient Active Problem List   Diagnosis Date Noted   BPH (benign prostatic hypertrophy) with urinary retention 09/23/2013    Arliss Journey, PT ,DPT  04/02/2021, 4:24 PM  [facility] [address] [place], [place], [address] Phone: [phone]   Fax:  [phone]  Name: Joshua Browning MRN: [id number] Date of Birth: 12-Nov-1945

## 2021-04-05 NOTE — Addendum Note (Signed)
Addended by: Arliss Journey on: 04/05/2021 12:57 PM   Modules accepted: Orders

## 2021-04-06 ENCOUNTER — Ambulatory Visit: Payer: Medicare Other | Admitting: Physical Therapy

## 2021-04-06 ENCOUNTER — Other Ambulatory Visit: Payer: Self-pay

## 2021-04-06 ENCOUNTER — Encounter: Payer: Self-pay | Admitting: Physical Therapy

## 2021-04-06 DIAGNOSIS — R2689 Other abnormalities of gait and mobility: Secondary | ICD-10-CM

## 2021-04-06 DIAGNOSIS — R29818 Other symptoms and signs involving the nervous system: Secondary | ICD-10-CM | POA: Diagnosis not present

## 2021-04-06 DIAGNOSIS — R293 Abnormal posture: Secondary | ICD-10-CM | POA: Diagnosis not present

## 2021-04-06 DIAGNOSIS — R2681 Unsteadiness on feet: Secondary | ICD-10-CM

## 2021-04-06 DIAGNOSIS — M6281 Muscle weakness (generalized): Secondary | ICD-10-CM | POA: Diagnosis not present

## 2021-04-06 NOTE — Therapy (Signed)
[name] [address] [place], [place], [address] Phone: [phone]   Fax:  [phone]  Physical Therapy Treatment  Patient Details  Name: Joshua Browning MRN: [id number] Date of Birth: [birth year] Referring Provider (PT): Tat, Wells Guiles   Encounter Date: 04/06/2021   PT End of Session - 04/06/21 1153     Visit Number 12    Number of Visits 17    Date for PT Re-Evaluation [id number]   per re-cert on [id number]   Authorization Type [employer] Medicare    PT Start Time 1101    PT Stop Time 1142    PT Time Calculation (min) 41 min    Activity Tolerance Patient tolerated treatment well    Behavior During Therapy WFL for tasks assessed/performed             Past Medical History:  Diagnosis Date   BPH (benign prostatic hyperplasia)    Foley catheter in place    removed after turp   GERD (gastroesophageal reflux disease)    Nocturnal leg cramps    occ   Urinary retention    Wears glasses     Past Surgical History:  Procedure Laterality Date   CATARACT EXTRACTION W/ INTRAOCULAR LENS  IMPLANT, BILATERAL Bilateral    INGUINAL HERNIA REPAIR Bilateral RIGHT x 2 Q000111Q umbilical done with last right   left last one done 10 yrs ago   RE-DO RIGHT INGUINAL HERNIA REPAIR AND UMBILICAL HERNIA REPAIR  2001   REMOVAL LEFT EYE INTRAOCULAR LENS AND REPLACEMENT   08-29-2013   TRANSURETHRAL RESECTION OF PROSTATE N/A 09/23/2013   Procedure: TRANSURETHRAL RESECTION OF THE PROSTATE (TURP) WITH GYRUS;  Surgeon: Claybon Jabs, MD;  Location: [facility];  Service: Urology;  Laterality: N/A;   VARICOCELECTOMY Left 07/02/2018   Procedure: VARICOCELECTOMY;  Surgeon: Kathie Rhodes, MD;  Location: [facility];  Service: Urology;  Laterality: Left;    There were no vitals filed for this visit.   Subjective Assessment - 04/06/21 1149     Subjective No changes, no falls.  Will plan to try the PWR! Moves Class  tomorrow.  Overall, feel like therapy has really helped me.    Patient Stated Goals Pt's goals for therapy are to work on my left hip/leg with walking.    Currently in Pain? No/denies                  Neuro Re-education   Performed PWR moves below in prone, quadruped, supine position for pt to review different positions before beginning PWR moves class this Wednesday. Discussed that pt can always modify varied positions in class as needed.     Pt performs PWR! Moves in quadruped position:    PWR! Up for improved posture x10 reps    PWR! Rock for improved weighshifting x 5 reps    PWR! Twist for improved trunk rotation 5 reps Bilat, cues for alternating UE when performing.    PWR! Step for improved step initiation 5 reps Bilat, pt improving with ROM and incr step intensity with incr reps.  Did perform in modified position x 3 reps, with hands supported at mat for PWR! Step, with improved flexibility noted in this position.       Pt performs PWR! Moves in prone position:    PWR! Up for improved posture x10 reps    PWR! Rock for improved weighshifting x5 reps Bilat, cues for wider BOS on mat    PWR!  Twist for improved trunk rotation-cues for sidelying positioning to initiate, with improved flexibility with repetition.   PWR! Step for improved step initiation x5 reps bilat   Pt performs PWR! Moves in supine position:  PWR! Up  for posture, through shoulders x 5 reps, through hips x 5 reps, with lower extremities extended.  PWR! Rock improved weightshifting x 5 reps bilat  PWR! Twist for improved trunk rotation, x 5 reps bilat  PWR! Step for improved step initiation x 3 reps each side      Verbal/demo/and tactile cues provided for technique, intensity of movement. Cues throughout for pt to count to make sure he is not holding his breath.               [facility] Adult PT Treatment/Exercise - 04/06/21 0001       Transfers   Transfers Floor to Transfer     Floor to Transfer 5: Supervision;Other (comment);6: Modified independent (Device/Increase time)    Floor to Transfer Details (indicate cue type and reason) performs x 1 reps from floor on red mat, simulating getting up from floor in PWR! Moves class; pt able to perform with UE support at mat.      Ambulation/Gait   Ambulation/Gait Yes    Ambulation/Gait Assistance 5: Supervision    Ambulation/Gait Assistance Details Cues for posture, stride length    Ambulation Distance (Feet) 345 Feet    Assistive device None    Gait Pattern Step-through pattern;Decreased step length - left;Decreased dorsiflexion - left;Left circumduction;Left foot flat;Right foot flat;Narrow base of support;Poor foot clearance - left    Ambulation Surface Level;Indoor    Gait Comments Gait with quick stop/starts, pt able to stop with stagger stance position and good balance prior to initiating gait again.                    PT Education - 04/06/21 1152     Education Details Reviewed information about PWR! Moves classes, with pt planning to transition to those classes this week; discussed benefits and rationale for the varied positions with the PWR! Moves    Person(s) Educated Patient    Methods Explanation;Demonstration    Comprehension Verbalized understanding              PT Short Term Goals - 03/08/21 1328       PT SHORT TERM GOAL #1   Title Pt will be independent with HEP for improved balance, posture, functional mobility.  03/05/2021    Baseline independent with initial HEP    Time 4    Period Weeks    Status Achieved      PT SHORT TERM GOAL #2   Title Pt will improve 5x sit<>stand to less than or equal to 12.5 seconds for improved transfer efficiency and safety.    Baseline 14.79 sec; 12.78 sec 03/08/21    Time 4    Period Weeks    Status Achieved      PT SHORT TERM GOAL #3   Title Pt will improve posterior step and release test to 2 steps or less with independent balance recovery, to  demonstrate improved balance recovery in posterior direction.    Baseline 1 step to recover posterior direction 03/08/2021    Time 4    Period Weeks    Status Achieved      PT SHORT TERM GOAL #4   Title Pt will improveTUG score to less than or equal to 13.5 sec for decreased fall risk.  Baseline 14.54, 11.19 sec 03/08/2021    Time 4    Period Weeks    Status Achieved               PT Long Term Goals - 04/05/21 1254       PT LONG TERM GOAL #1   Title Pt will be independent with progression of HEP to address strength, balance, posture, and mobility.  TARGET 04/19/21    Time 2    Period Weeks    Status On-going    Target Date 04/19/21      PT LONG TERM GOAL #4   Title Pt will verbalize understanding of transition to appropriate community fitness classes upon d/c from PT to maximize gains made in therapy.    Baseline pt to transition to PWR moves classes - pt to attend first class next week    Time 2    Period Weeks    Status On-going                   Plan - 04/06/21 1153     Clinical Impression Statement Skilled PT session focused on PWR! Moves in prone, quadruped, and supine with pillow under head, to work on additional positions that pt will perform as he transitions to [employer]! Moves exercise class.  Did try modified position in quadruped for PWR! Step, with pt holding to mat, with improved ability for foot clearance with the step motion.  Overall, pt appears to be performing the exercise positions well and reports/demo he would be appropriate for transition to community PWR! Moves class.    Personal Factors and Comorbidities Comorbidity 3+    Comorbidities BPH, GERD, cystoid macular edema.  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    Examination-Activity Limitations Transfers;Stand;Locomotion Level    Examination-Participation Restrictions Community Activity;[employer] activities, community fitness classes    Stability/Clinical Decision Making Evolving/Moderate complexity    Rehab Potential Good    PT Frequency 2x / week    PT Duration 2 weeks    PT Treatment/Interventions ADLs/Self Care Home Management;Gait training;Functional mobility training;Therapeutic activities;Therapeutic exercise;Balance training;Neuromuscular re-education;DME Instruction;Patient/family education    PT Next Visit Plan answer any questions pt has about PWR moves and review any that pt has before class. (I didn't feel like he needs any additional sets of PWR! Moves in his HEP).  finalize pt's HEP for D/C at end of this week. balance strategies in posterior direction, SLS.    Consulted and Agree with Plan of Care Patient             Patient will benefit from skilled therapeutic intervention in order to improve the following deficits and impairments:  Abnormal gait, Difficulty walking, Decreased balance, Decreased mobility, Decreased strength, Postural dysfunction, Impaired flexibility  Visit Diagnosis: Abnormal posture  Unsteadiness on feet  Other symptoms and signs involving the nervous system  Other abnormalities of gait and mobility     Problem List Patient Active Problem List   Diagnosis Date Noted   BPH (benign prostatic hypertrophy) with urinary retention 09/23/2013    Nelissa Bolduc W. 04/06/2021, 11:58 AM Frazier Butt., PT   [facility] [address] [place], [place], [address] Phone: [phone]   Fax:  [phone]  Name: Joshua Browning MRN: [id number] Date of Birth: 10/14/1945

## 2021-04-08 ENCOUNTER — Other Ambulatory Visit: Payer: Self-pay

## 2021-04-08 ENCOUNTER — Ambulatory Visit: Payer: Medicare Other | Admitting: Physical Therapy

## 2021-04-08 ENCOUNTER — Encounter: Payer: Self-pay | Admitting: Physical Therapy

## 2021-04-08 DIAGNOSIS — R29818 Other symptoms and signs involving the nervous system: Secondary | ICD-10-CM | POA: Diagnosis not present

## 2021-04-08 DIAGNOSIS — R293 Abnormal posture: Secondary | ICD-10-CM | POA: Diagnosis not present

## 2021-04-08 DIAGNOSIS — R2689 Other abnormalities of gait and mobility: Secondary | ICD-10-CM | POA: Diagnosis not present

## 2021-04-08 DIAGNOSIS — R2681 Unsteadiness on feet: Secondary | ICD-10-CM | POA: Diagnosis not present

## 2021-04-08 DIAGNOSIS — M6281 Muscle weakness (generalized): Secondary | ICD-10-CM | POA: Diagnosis not present

## 2021-04-08 NOTE — Therapy (Signed)
[facility] [address] [place], [place], [address] Phone: [phone]   Fax:  [phone]  Physical Therapy Treatment/Discharge Summary  Patient Details  Name: Joshua Browning MRN: [id number] Date of Birth: 09/06/1945 Referring Provider (PT): Tat, Wells Guiles   Encounter Date: 04/08/2021   PT End of Session - 04/08/21 1209     Visit Number 13    Number of Visits 17    Date for PT Re-Evaluation 93/26/71   per re-cert on 2/45/80   Authorization Type [employer]HC Medicare    PT Start Time 1016    PT Stop Time 1058    PT Time Calculation (min) 42 min    Activity Tolerance Patient tolerated treatment well    Behavior During Therapy WFL for tasks assessed/performed             Past Medical History:  Diagnosis Date   BPH (benign prostatic hyperplasia)    Foley catheter in place    removed after turp   GERD (gastroesophageal reflux disease)    Nocturnal leg cramps    occ   Urinary retention    Wears glasses     Past Surgical History:  Procedure Laterality Date   CATARACT EXTRACTION W/ INTRAOCULAR LENS  IMPLANT, BILATERAL Bilateral    INGUINAL HERNIA REPAIR Bilateral RIGHT x 2 9983 umbilical done with last right   left last one done 10 yrs ago   RE-DO RIGHT INGUINAL HERNIA REPAIR AND UMBILICAL HERNIA REPAIR  2001   REMOVAL LEFT EYE INTRAOCULAR LENS AND REPLACEMENT   08-29-2013   TRANSURETHRAL RESECTION OF PROSTATE N/A 09/23/2013   Procedure: TRANSURETHRAL RESECTION OF THE PROSTATE (TURP) WITH GYRUS;  Surgeon: Claybon Jabs, MD;  Location: [facility];  Service: Urology;  Laterality: N/A;   VARICOCELECTOMY Left 07/02/2018   Procedure: VARICOCELECTOMY;  Surgeon: Kathie Rhodes, MD;  Location: [facility];  Service: Urology;  Laterality: Left;    There were no vitals filed for this visit.   Subjective Assessment - 04/08/21 1018     Subjective PWR moves class went really well. Was able to do  most of the exercises on the ground. no falls.    Patient Stated Goals Pt's goals for therapy are to work on my left hip/leg with walking.    Currently in Pain? No/denies                         Access Code: [email or web address] URL: [email or web address] Date: 04/08/2021 Prepared by: Janann August  Reviewed [employer] HEP - see MedBridge for further details.   Exercises Staggered Sit-to-Stand (Mirrored) - 1 x daily - 5 x weekly - 2 sets - 5 reps Staggered Stance Forward Backward Weight Shift with Counter Support - 1 x daily - 5 x weekly - 2 sets - 10 reps Standing Marching - 1 x daily - 5 x weekly - 2 sets - 10 reps Forward Step Up - 1 x daily - 5 x weekly - 2 sets - 5 reps Standing posture stretch - 1 x daily - 7 x weekly - 1 sets - 5 reps - 10-15 sec hold Seated Hamstring Stretch - 1 x daily - 7 x weekly - 1 sets - 3 reps - 30 sec hold Standing Gastroc Stretch on Step - 1 x daily - 7 x weekly - 1 sets - 3 reps - 30 sec hold       [facility] Adult PT Treatment/Exercise - 04/08/21 1057  Therapeutic Activites    Therapeutic Activities Other Therapeutic Activities    Other Therapeutic Activities discussed scheduling for PD screens in 6 months for all 3 disciplines, pt in agreement with plan            Reviewed supine PWR Step for PWR moves classes.  Performed with yoga block under head for improved positioning Performed x5 reps B, initial cues for proper technique and incr ROM for scoot.   Verbally reviewed seated/standing PWR moves for pt's HEP. Pt feels good about these and is also doing them for [employer] class.      PHYSICAL THERAPY DISCHARGE SUMMARY  Visits from Start of Care: 13  Current functional level related to goals / functional outcomes: miniBEST: 22/28 Gait speed: 2.98 ft/sec   Remaining deficits: Gait abnormality, impaired posture, decr balance   Education / Equipment: HEP, pt to attend PWR moves exercise classes    Patient  agrees to discharge. Patient goals were met. Patient is being discharged due to meeting the stated rehab goals.      PT Short Term Goals - 03/08/21 1328       PT SHORT TERM GOAL #1   Title Pt will be independent with HEP for improved balance, posture, functional mobility.  03/05/2021    Baseline independent with initial HEP    Time 4    Period Weeks    Status Achieved      PT SHORT TERM GOAL #2   Title Pt will improve 5x sit<>stand to less than or equal to 12.5 seconds for improved transfer efficiency and safety.    Baseline 14.79 sec; 12.78 sec 03/08/21    Time 4    Period Weeks    Status Achieved      PT SHORT TERM GOAL #3   Title Pt will improve posterior step and release test to 2 steps or less with independent balance recovery, to demonstrate improved balance recovery in posterior direction.    Baseline 1 step to recover posterior direction 03/08/2021    Time 4    Period Weeks    Status Achieved      PT SHORT TERM GOAL #4   Title Pt will improveTUG score to less than or equal to 13.5 sec for decreased fall risk.    Baseline 14.54, 11.19 sec 03/08/2021    Time 4    Period Weeks    Status Achieved               PT Long Term Goals - 04/08/21 1216       PT LONG TERM GOAL #1   Title Pt will be independent with progression of HEP to address strength, balance, posture, and mobility.  TARGET 04/19/21    Time 2    Period Weeks    Status Achieved      PT LONG TERM GOAL #4   Title Pt will verbalize understanding of transition to appropriate community fitness classes upon d/c from PT to maximize gains made in therapy.    Baseline pt attended first PWR moves class    Time 2    Period Weeks    Status Achieved                   Plan - 04/08/21 1217     Clinical Impression Statement Today's skilled session focused on reviewing pt's final HEP for ROM, strengthening, balance and seated/standing PWR moves. Pt has been consistent with performing and verbalizes  understanding of continuing to perform.  Pt has met both LTGs with HEP and community fitness program (PWR moves). Pt in agreement to D/C based on progress with therapy and will schedule for return PD screens in 6 months.    Personal Factors and Comorbidities Comorbidity 3+    Comorbidities BPH, GERD, cystoid macular edema.  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    Examination-Activity Limitations Transfers;Stand;Locomotion Level    Examination-Participation Restrictions Community Activity;[employer] activities, community fitness classes   Stability/Clinical Decision Making Evolving/Moderate complexity    Rehab Potential Good    PT Frequency 2x / week    PT Duration 2 weeks    PT Treatment/Interventions ADLs/Self Care Home Management;Gait training;Functional mobility training;Therapeutic activities;Therapeutic exercise;Balance training;Neuromuscular re-education;DME Instruction;Patient/family education    PT Next Visit Plan D/C    Consulted and Agree with Plan of Care Patient             Patient will benefit from skilled therapeutic intervention in order to improve the following deficits and impairments:  Abnormal gait, Difficulty walking, Decreased balance, Decreased mobility, Decreased strength, Postural dysfunction, Impaired flexibility  Visit Diagnosis: Abnormal posture  Unsteadiness on feet  Other symptoms and signs involving the nervous system  Other abnormalities of gait and mobility     Problem List Patient Active Problem List   Diagnosis Date Noted   BPH (benign prostatic hypertrophy) with urinary retention 09/23/2013    Arliss Journey, PT, DPT  04/08/2021, 12:18 PM  [facility] [address] [place], [place], [address] Phone: [phone]   Fax:  [phone]  Name: FLYNT BREEZE MRN: [id number] Date of Birth: 1945-09-11

## 2021-04-08 NOTE — Patient Instructions (Signed)
Access Code: [email or web address] URL: [email or web address] Date: 04/08/2021 Prepared by: Janann August  Exercises Staggered Sit-to-Stand (Mirrored) - 1 x daily - 5 x weekly - 2 sets - 5 reps Staggered Stance Forward Backward Weight Shift with Counter Support - 1 x daily - 5 x weekly - 2 sets - 10 reps Standing Marching - 1 x daily - 5 x weekly - 2 sets - 10 reps Forward Step Up - 1 x daily - 5 x weekly - 2 sets - 5 reps Standing posture stretch - 1 x daily - 7 x weekly - 1 sets - 5 reps - 10-15 sec hold Seated Hamstring Stretch - 1 x daily - 7 x weekly - 1 sets - 3 reps - 30 sec hold Standing Gastroc Stretch on Step - 1 x daily - 7 x weekly - 1 sets - 3 reps - 30 sec hold

## 2021-04-13 DIAGNOSIS — R634 Abnormal weight loss: Secondary | ICD-10-CM | POA: Diagnosis not present

## 2021-04-13 DIAGNOSIS — R6881 Early satiety: Secondary | ICD-10-CM | POA: Diagnosis not present

## 2021-04-20 DIAGNOSIS — R6881 Early satiety: Secondary | ICD-10-CM | POA: Diagnosis not present

## 2021-04-20 DIAGNOSIS — R634 Abnormal weight loss: Secondary | ICD-10-CM | POA: Diagnosis not present

## 2021-04-20 DIAGNOSIS — R590 Localized enlarged lymph nodes: Secondary | ICD-10-CM | POA: Diagnosis not present

## 2021-04-21 ENCOUNTER — Other Ambulatory Visit: Payer: Self-pay | Admitting: Physician Assistant

## 2021-04-21 DIAGNOSIS — R634 Abnormal weight loss: Secondary | ICD-10-CM

## 2021-04-21 DIAGNOSIS — R590 Localized enlarged lymph nodes: Secondary | ICD-10-CM

## 2021-04-29 ENCOUNTER — Ambulatory Visit
Admission: RE | Admit: 2021-04-29 | Discharge: 2021-04-29 | Disposition: A | Discharge status: Home / Self Care | Payer: Medicare Other | Source: Ambulatory Visit | Attending: Physician Assistant | Admitting: Physician Assistant

## 2021-04-29 ENCOUNTER — Other Ambulatory Visit: Payer: Self-pay | Admitting: Physician Assistant

## 2021-04-29 DIAGNOSIS — R634 Abnormal weight loss: Secondary | ICD-10-CM | POA: Diagnosis not present

## 2021-04-29 DIAGNOSIS — R599 Enlarged lymph nodes, unspecified: Secondary | ICD-10-CM | POA: Diagnosis not present

## 2021-04-29 DIAGNOSIS — R590 Localized enlarged lymph nodes: Secondary | ICD-10-CM

## 2021-04-29 DIAGNOSIS — L040 Acute lymphadenitis of face, head and neck: Secondary | ICD-10-CM | POA: Diagnosis not present

## 2021-04-29 DIAGNOSIS — N3289 Other specified disorders of bladder: Secondary | ICD-10-CM | POA: Diagnosis not present

## 2021-04-29 MED ORDER — IOPAMIDOL (ISOVUE-370) INJECTION 76%
60.0000 mL | Freq: Once | INTRAVENOUS | Status: AC | PRN
  Administered 2021-04-29: 60 mL via INTRAVENOUS

## 2021-05-05 DIAGNOSIS — C44229 Squamous cell carcinoma of skin of left ear and external auricular canal: Secondary | ICD-10-CM | POA: Diagnosis not present

## 2021-05-05 DIAGNOSIS — C44319 Basal cell carcinoma of skin of other parts of face: Secondary | ICD-10-CM | POA: Diagnosis not present

## 2021-05-05 DIAGNOSIS — L570 Actinic keratosis: Secondary | ICD-10-CM | POA: Diagnosis not present

## 2021-05-05 DIAGNOSIS — L814 Other melanin hyperpigmentation: Secondary | ICD-10-CM | POA: Diagnosis not present

## 2021-05-05 DIAGNOSIS — Z85828 Personal history of other malignant neoplasm of skin: Secondary | ICD-10-CM | POA: Diagnosis not present

## 2021-05-05 DIAGNOSIS — L280 Lichen simplex chronicus: Secondary | ICD-10-CM | POA: Diagnosis not present

## 2021-05-05 DIAGNOSIS — C4401 Basal cell carcinoma of skin of lip: Secondary | ICD-10-CM | POA: Diagnosis not present

## 2021-05-12 ENCOUNTER — Telehealth: Payer: Self-pay | Admitting: *Deleted

## 2021-05-12 NOTE — Progress Notes (Addendum)
New Hematology/Oncology Consult   Requesting MD: Deliah Goody  [phone]      Reason for Consult: Abnormal CT scan  HPI: Joshua Browning is a 75 year old man referred for evaluation of an abnormal neck CT scan.  He underwent CT scans of the neck and abdomen/pelvis on 04/29/2021 due to unintentional weight loss and left cervical adenopathy.  The neck CT showed generalized cervical adenopathy with an increased number of small solid nodes in bilateral neck, similar visible axillary adenopathy.  All neck nodes subcentimeter.  CT abdomen/pelvis showed no significant abnormality.  Joshua Browning reports initially noting an enlarged lymph node at the left neck about 6 months ago.  There has been no significant change.  He estimates about 30 pounds of weight loss over the past 3 years or so.  He feels hungry but has early satiety.  No fevers or sweats.  Periodic dysphagia, mainly with dry food.   Past Medical History:  Diagnosis Date   BPH (benign prostatic hyperplasia)    Foley catheter in place    removed after turp   GERD (gastroesophageal reflux disease)    Nocturnal leg cramps    occ   Urinary retention    Wears glasses   Parkinson's   Past Surgical History:  Procedure Laterality Date   CATARACT EXTRACTION W/ INTRAOCULAR LENS  IMPLANT, BILATERAL Bilateral    INGUINAL HERNIA REPAIR Bilateral RIGHT x 2 0867 umbilical done with last right   left last one done 10 yrs ago   RE-DO RIGHT INGUINAL HERNIA REPAIR AND UMBILICAL HERNIA REPAIR  2001   REMOVAL LEFT EYE INTRAOCULAR LENS AND REPLACEMENT   08-29-2013   TRANSURETHRAL RESECTION OF PROSTATE N/A 09/23/2013   Procedure: TRANSURETHRAL RESECTION OF THE PROSTATE (TURP) WITH GYRUS;  Surgeon: Claybon Jabs, MD;  Location: [facility];  Service: Urology;  Laterality: N/A;   VARICOCELECTOMY Left 07/02/2018   Procedure: VARICOCELECTOMY;  Surgeon: Kathie Rhodes, MD;  Location: [facility];  Service: Urology;  Laterality:  Left;  :   Current Outpatient Medications:    b complex vitamins tablet, Take 1 tablet by mouth daily. (Patient not taking: Reported on 02/01/2021), Disp: , Rfl:    carbidopa-levodopa (SINEMET CR) 50-200 MG tablet, 1 po q hs, Disp: 90 tablet, Rfl: 1   carbidopa-levodopa (SINEMET IR) 25-100 MG tablet, Take 1.5 tablets by mouth 3 (three) times daily., Disp: 405 tablet, Rfl: 1   Cholecalciferol (VITAMIN D3) 1000 UNITS CAPS, Take 1 capsule by mouth every evening., Disp: , Rfl:    clonazePAM (KLONOPIN) 0.5 MG tablet, TAKE 1/2 TABLET BY MOUTH EVERY DAY AT BEDTIME, Disp: 45 tablet, Rfl: 0   famotidine (PEPCID) 20 MG tablet, Take 20 mg by mouth at bedtime. Takes pepcid ac, Disp: , Rfl:    Glucosamine Sulfate 1000 MG CAPS, Take by mouth daily. , Disp: , Rfl:    Magnesium 100 MG CAPS, Take 1 capsule by mouth every evening., Disp: , Rfl:    mirtazapine (REMERON) 15 MG tablet, Take 1 tablet (15 mg total) by mouth at bedtime., Disp: 90 tablet, Rfl: 1   Multiple Vitamins-Minerals (VITABASIC SENIOR PO), Take 1 tablet by mouth., Disp: , Rfl:    Potassium 99 MG TABS, Take by mouth daily., Disp: , Rfl: :  :  No Known Allergies:  FH: Father with MI, mother with CHF.  Paternal uncle with pancreas cancer.  SOCIAL HISTORY: He lives in [place].  [name].  He has 3 sons all reported to be  in good health.  He is retired from [employer].  He also worked at the [employer] for several years.  No tobacco or alcohol use.  He has never had a blood transfusion.  Review of Systems: No fevers or sweats.  No pain.  He estimates 30 pounds of weight loss over the past 3+ years.  He feels hungry but notes early satiety.  Mild dysphagia mainly with dry food.  No unusual headaches.  No vision change.  No shortness of breath or cough.  No chest pain.  No leg swelling or calf pain.  No change in bowel habits.  No urinary symptoms.  He noticed a left neck lymph node about 6 months ago.  There has been no significant  change.  Physical Exam:  Blood pressure (!) 143/84, pulse 63, temperature 98.2 F (36.8 C), temperature source Oral, resp. rate 18, height 5\' 10"  (1.778 m), weight 149 lb 12.8 oz (67.9 kg), SpO2 100 %.  HEENT: No thrush or ulcers.  No mass lesion in the oral cavity. Lungs: Lungs clear bilaterally. Cardiac: Regular rate and rhythm. Abdomen: No hepatosplenomegaly.  No mass. Vascular: No leg edema. Lymph nodes: Bilateral subcentimeter cervical lymph nodes.  Tiny submental lymph node.  Small bilateral axillary lymph nodes.  All lymph nodes are soft, mobile. Neurologic: Alert and oriented.  Follows commands.   LABS:  Peripheral blood smear: platelets-normal in number; RBCs-polychromasia not increased, moderate number of ovalocytes, few acanthocytes; WBCs-relative increase in mature appearing lymphocytes, scattered smudge cells.  RADIOLOGY:  CT SOFT TISSUE NECK W CONTRAST  Result Date: 04/30/2021 CLINICAL DATA:  75 year old male with unintentional weight loss. Left side cervical lymphadenopathy x6 months. Creatinine was obtained on site at [facility] at [address] Results: Creatinine 0.9 mg/dL. EXAM: CT NECK WITH CONTRAST TECHNIQUE: Multidetector CT imaging of the neck was performed using the standard protocol following the bolus administration of intravenous contrast. CONTRAST:  76mL ISOVUE-370 IOPAMIDOL (ISOVUE-370) INJECTION 76% COMPARISON:  Cervical spine MRI 05/13/2017. Brain MRI 12/08/2015. FINDINGS: Pharynx and larynx: Larynx is within normal limits. There is mild asymmetry of the lingual tonsil, greater on the right but within normal limits. Palatine tonsils may be surgically absent. Adenoids are diminutive. Other pharyngeal soft tissue contours are within normal limits. Parapharyngeal and retropharyngeal spaces are within normal limits. Salivary glands: Sublingual space, submandibular glands, and parotid glands are within normal limits. Thyroid: Negative; subcentimeter  hypodense right thyroid nodule Not clinically significant; no follow-up imaging recommended (ref: J Am Coll Radiol. 2015 Feb;12(2): 143-50). Lymph nodes: Subcentimeter, but abnormally numerous bilateral cervical lymph nodes (sagittal image 43 on the right levels 2 and 3). Many of the lymph nodes are 6-7 mm short axis, including a left level IIIb node which corresponds to a marked area of clinical concern (coronal image 63 and series 2, image 68. The largest nodes are 8-9 mm short axis (right level IIIb or level 5 series 2, image 77). And nodes appear too numerous at all stations. No cystic or necrotic nodes. Vascular: Major vascular structures in the neck and at the skull base are patent with mild tortuosity. Minimal ICA siphon calcified atherosclerosis. Limited intracranial: Negative. Visualized orbits: Minimally included. Mastoids and visualized paranasal sinuses: Well aerated bilaterally. 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 Superimposed exaggerated cervical lordosis, chronic disc and endplate degeneration elsewhere. No acute or suspicious osseous lesion identified. Upper chest: Visible bilateral axillary lymph nodes appear small but increased in number. But the visible mediastinal lymph nodes appear relatively  normal. Mild upper lobe lung scarring. IMPRESSION: 1. Generalized cervical lymphadenopathy in the form of an increased number of small solid nodes in the bilateral neck, and similar visible axillary lymphadenopathy. All neck nodes are subcentimeter short axis, including a 7 mm node at the left level IIIb station which was marked as an area of concern. The appearance is most suspicious for Leukemia or other lymphoproliferative disorder. 2. No other acute finding. 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 Electronically Signed   By: Genevie Ann M.D.   On: 04/30/2021 10:11   CT ABDOMEN PELVIS  W CONTRAST  Result Date: 04/29/2021 CLINICAL DATA:  Abnormal weight loss. EXAM: CT ABDOMEN AND PELVIS WITH CONTRAST TECHNIQUE: Multidetector CT imaging of the abdomen and pelvis was performed using the standard protocol following bolus administration of intravenous contrast. CONTRAST:  26mL ISOVUE-370 IOPAMIDOL (ISOVUE-370) INJECTION 76% COMPARISON:  None. FINDINGS: Lower chest: No acute abnormality. Hepatobiliary: No focal liver abnormality is seen. No gallstones, gallbladder wall thickening, or biliary dilatation. Pancreas: Unremarkable. No pancreatic ductal dilatation or surrounding inflammatory changes. Spleen: Normal in size without focal abnormality. Adrenals/Urinary Tract: Adrenal glands appear normal. Large right parapelvic cyst is noted. No hydronephrosis or renal obstruction is noted. Mild distention of urinary bladder is noted. Stomach/Bowel: Stomach is within normal limits. Appendix appears normal. No evidence of bowel wall thickening, distention, or inflammatory changes. Vascular/Lymphatic: No significant vascular findings are present. No enlarged abdominal or pelvic lymph nodes. Reproductive: Moderate prostatic enlargement is noted. Other: No abdominal wall hernia or abnormality. No abdominopelvic ascites. Musculoskeletal: No acute or significant osseous findings. IMPRESSION: Mild urinary bladder distention is noted. Moderate prostatic enlargement. No other significant abnormality seen in the abdomen or pelvis. Electronically Signed   By: Marijo Conception M.D.   On: 04/29/2021 17:03    Assessment and Plan:   Small bilateral cervical and axillary lymph nodes CT neck 04/29/2021-subcentimeter but abnormally numerous bilateral cervical lymph nodes.  Largest node 8 to 9 mm.  Small bilateral axillary lymph nodes.  Stable mediastinal lymph nodes appear relatively normal. Weight loss Early satiety Mild dysphagia Parkinson's  Joshua Browning has small bilateral cervical and axillary lymph nodes identified  on recent neck CT scan.  Dr. Benay Spice discussed different etiologies of lymphadenopathy with him and his wife.  He has no signs/symptoms to suggest infection.  He may have CLL.  He will return to the lab today for a CBC, chemistry panel, LDH, quantitative immunoglobulins and peripheral blood for flow cytometry.  It is not clear that the weight loss is related to the lymphadenopathy.  The weight loss may be related to Parkinson's.  We recommend he follow-up with GI for evaluation of early satiety and dysphagia.  He will return for follow-up in approximately 3 weeks.  Patient seen with Dr. Benay Spice.  Ned Card, NP 05/13/2021, 3:08 PM   This was a shared visit with Ned Card.  Joshua Browning was interviewed and examined.  We reviewed the neck CT images with Joshua Browning and his wife.  He is referred for evaluation of lymphadenopathy and weight loss.  He has small palpable lymph nodes in the neck and axillae.  The clinical presentation is consistent with a low-grade lymphoma such as CLL.  It is unclear whether the weight loss is related to the lymph nodes as he does not have other "B symptoms "seen with lymphoma.  The differential diagnosis includes an infection, collagen vascular disease, or another malignancy.  On The weight  loss may be related to Parkinson's disease.  We recommend he follow-up with GI for evaluation of dysphagia.  We will obtain a laboratory evaluation to look for evidence of CLL.  I was present for greater than 50% of today's visit.  I performed medical decision making.  Julieanne Manson, MD

## 2021-05-13 ENCOUNTER — Other Ambulatory Visit: Payer: Self-pay

## 2021-05-13 ENCOUNTER — Inpatient Hospital Stay: Payer: Medicare Other

## 2021-05-13 ENCOUNTER — Inpatient Hospital Stay: Payer: Medicare Other | Attending: Nurse Practitioner | Admitting: Nurse Practitioner

## 2021-05-13 ENCOUNTER — Encounter: Payer: Self-pay | Admitting: Nurse Practitioner

## 2021-05-13 VITALS — BP 143/84 | HR 63 | Temp 98.2°F | Resp 18 | Ht 70.0 in | Wt 149.8 lb

## 2021-05-13 DIAGNOSIS — R599 Enlarged lymph nodes, unspecified: Secondary | ICD-10-CM | POA: Diagnosis not present

## 2021-05-13 DIAGNOSIS — R590 Localized enlarged lymph nodes: Secondary | ICD-10-CM | POA: Insufficient documentation

## 2021-05-13 LAB — CMP (CANCER CENTER ONLY)
ALT: 5 U/L (ref 0–44)
AST: 16 U/L (ref 15–41)
Albumin: 4.8 g/dL (ref 3.5–5.0)
Alkaline Phosphatase: 45 U/L (ref 38–126)
Anion gap: 7 (ref 5–15)
BUN: 14 mg/dL (ref 8–23)
CO2: 31 mmol/L (ref 22–32)
Calcium: 9.6 mg/dL (ref 8.9–10.3)
Chloride: 104 mmol/L (ref 98–111)
Creatinine: 0.89 mg/dL (ref 0.61–1.24)
GFR, Estimated: >60 mL/min (ref >60)
Glucose, Bld: 97 mg/dL (ref 70–99)
Potassium: 4.6 mmol/L (ref 3.5–5.1)
Sodium: 142 mmol/L (ref 135–145)
Total Bilirubin: 0.5 mg/dL (ref 0.3–1.2)
Total Protein: 6.9 g/dL (ref 6.5–8.1)

## 2021-05-13 LAB — CBC WITH DIFFERENTIAL (CANCER CENTER ONLY)
Abs Immature Granulocytes: 0.02 10*3/uL (ref 0.00–0.07)
Basophils Absolute: 0.1 10*3/uL (ref 0.0–0.1)
Basophils Relative: 1 %
Eosinophils Absolute: 0.2 10*3/uL (ref 0.0–0.5)
Eosinophils Relative: 2 %
HCT: 44 % (ref 39.0–52.0)
Hemoglobin: 14.5 g/dL (ref 13.0–17.0)
Immature Granulocytes: 0 %
Lymphocytes Relative: 56 %
Lymphs Abs: 4.6 10*3/uL — ABNORMAL HIGH (ref 0.7–4.0)
MCH: 30.7 pg (ref 26.0–34.0)
MCHC: 33 g/dL (ref 30.0–36.0)
MCV: 93 fL (ref 80.0–100.0)
Monocytes Absolute: 0.6 10*3/uL (ref 0.1–1.0)
Monocytes Relative: 7 %
Neutro Abs: 2.9 10*3/uL (ref 1.7–7.7)
Neutrophils Relative %: 34 %
Platelet Count: 174 10*3/uL (ref 150–400)
RBC: 4.73 MIL/uL (ref 4.22–5.81)
RDW: 14 % (ref 11.5–15.5)
WBC Count: 8.3 10*3/uL (ref 4.0–10.5)
nRBC: 0 % (ref 0.0–0.2)

## 2021-05-13 LAB — SAVE SMEAR(SSMR), FOR PROVIDER SLIDE REVIEW

## 2021-05-13 LAB — LACTATE DEHYDROGENASE: LDH: 163 U/L (ref 98–192)

## 2021-05-14 LAB — SURGICAL PATHOLOGY

## 2021-05-16 LAB — IGG, IGA, IGM
IgA: 85 mg/dL (ref 61–437)
IgG (Immunoglobin G), Serum: 763 mg/dL (ref 603–1613)
IgM (Immunoglobulin M), Srm: 33 mg/dL (ref 15–143)

## 2021-05-17 LAB — FLOW CYTOMETRY

## 2021-05-19 ENCOUNTER — Telehealth: Payer: Self-pay

## 2021-05-19 NOTE — Telephone Encounter (Signed)
Verbalized understanding of call

## 2021-05-19 NOTE — Telephone Encounter (Signed)
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

## 2021-05-19 NOTE — Telephone Encounter (Signed)
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

## 2021-05-25 ENCOUNTER — Other Ambulatory Visit: Payer: Self-pay

## 2021-05-25 DIAGNOSIS — R599 Enlarged lymph nodes, unspecified: Secondary | ICD-10-CM

## 2021-05-28 ENCOUNTER — Other Ambulatory Visit: Payer: Self-pay | Admitting: Physician Assistant

## 2021-05-28 DIAGNOSIS — R1310 Dysphagia, unspecified: Secondary | ICD-10-CM | POA: Diagnosis not present

## 2021-05-28 DIAGNOSIS — R6881 Early satiety: Secondary | ICD-10-CM | POA: Diagnosis not present

## 2021-05-28 DIAGNOSIS — R634 Abnormal weight loss: Secondary | ICD-10-CM | POA: Diagnosis not present

## 2021-05-28 DIAGNOSIS — C9110 Chronic lymphocytic leukemia of B-cell type not having achieved remission: Secondary | ICD-10-CM | POA: Diagnosis not present

## 2021-05-28 DIAGNOSIS — R590 Localized enlarged lymph nodes: Secondary | ICD-10-CM | POA: Diagnosis not present

## 2021-05-31 ENCOUNTER — Inpatient Hospital Stay: Payer: Medicare Other | Attending: Oncology | Admitting: Oncology

## 2021-05-31 ENCOUNTER — Inpatient Hospital Stay: Payer: Medicare Other

## 2021-05-31 ENCOUNTER — Other Ambulatory Visit: Payer: Self-pay

## 2021-05-31 VITALS — BP 150/86 | HR 73 | Temp 98.2°F | Resp 18 | Ht 70.0 in | Wt 146.4 lb

## 2021-05-31 DIAGNOSIS — C9110 Chronic lymphocytic leukemia of B-cell type not having achieved remission: Secondary | ICD-10-CM | POA: Diagnosis not present

## 2021-05-31 DIAGNOSIS — R1310 Dysphagia, unspecified: Secondary | ICD-10-CM | POA: Diagnosis not present

## 2021-05-31 DIAGNOSIS — R599 Enlarged lymph nodes, unspecified: Secondary | ICD-10-CM

## 2021-05-31 DIAGNOSIS — G20 Parkinson's disease: Secondary | ICD-10-CM | POA: Insufficient documentation

## 2021-05-31 DIAGNOSIS — R634 Abnormal weight loss: Secondary | ICD-10-CM | POA: Diagnosis not present

## 2021-05-31 LAB — CBC WITH DIFFERENTIAL (CANCER CENTER ONLY)
Abs Immature Granulocytes: 0.01 10*3/uL (ref 0.00–0.07)
Basophils Absolute: 0 10*3/uL (ref 0.0–0.1)
Basophils Relative: 1 %
Eosinophils Absolute: 0.1 10*3/uL (ref 0.0–0.5)
Eosinophils Relative: 2 %
HCT: 43.3 % (ref 39.0–52.0)
Hemoglobin: 14.1 g/dL (ref 13.0–17.0)
Immature Granulocytes: 0 %
Lymphocytes Relative: 58 %
Lymphs Abs: 4.2 10*3/uL — ABNORMAL HIGH (ref 0.7–4.0)
MCH: 30.2 pg (ref 26.0–34.0)
MCHC: 32.6 g/dL (ref 30.0–36.0)
MCV: 92.7 fL (ref 80.0–100.0)
Monocytes Absolute: 0.5 10*3/uL (ref 0.1–1.0)
Monocytes Relative: 8 %
Neutro Abs: 2.2 10*3/uL (ref 1.7–7.7)
Neutrophils Relative %: 31 %
Platelet Count: 183 10*3/uL (ref 150–400)
RBC: 4.67 MIL/uL (ref 4.22–5.81)
RDW: 14.1 % (ref 11.5–15.5)
WBC Count: 7.1 10*3/uL (ref 4.0–10.5)
nRBC: 0 % (ref 0.0–0.2)

## 2021-05-31 NOTE — Progress Notes (Signed)
  [facility] OFFICE PROGRESS NOTE   Diagnosis: CLL  INTERVAL HISTORY:   [name] returns as scheduled.  He is here with his wife.  He continues to have dysphagia with certain foods.  He is scheduled for a barium swallow next week.  [name] denies fever and night sweats.  He reports a good appetite.  Objective:  Vital signs in last 24 hours:  Blood pressure (!) 150/86, pulse 73, temperature 98.2 F (36.8 C), temperature source Oral, resp. rate 18, height 5\' 10"  (1.778 m), weight 146 lb 6.4 oz (66.4 kg), SpO2 100 %.    Lymphatics: 1-2 cm bilateral cervical, axillary, and inguinal nodes Resp: Distant breath sounds, lungs clear bilaterally Cardio: Regular rate and rhythm GI: No hepatosplenomegaly Vascular: No leg edema  Lab Results:  Lab Results  Component Value Date   WBC 7.1 05/31/2021   HGB 14.1 05/31/2021   HCT 43.3 05/31/2021   MCV 92.7 05/31/2021   PLT 183 05/31/2021   NEUTROABS 2.2 05/31/2021    CMP  Lab Results  Component Value Date   NA 142 05/13/2021   K 4.6 05/13/2021   CL 104 05/13/2021   CO2 31 05/13/2021   GLUCOSE 97 05/13/2021   BUN 14 05/13/2021   CREATININE 0.89 05/13/2021   CALCIUM 9.6 05/13/2021   PROT 6.9 05/13/2021   ALBUMIN 4.8 05/13/2021   AST 16 05/13/2021   ALT 5 05/13/2021   ALKPHOS 45 05/13/2021   BILITOT 0.5 05/13/2021   GFRNONAA >60 05/13/2021     Medications: I have reviewed the patient's current medications.   Assessment/Plan:  CLL Small bilateral cervical and axillary lymph nodes CT neck 04/29/2021-subcentimeter but abnormally numerous bilateral cervical lymph nodes.  Largest node 8 to 9 mm.  Small bilateral axillary lymph nodes.  Stable mediastinal lymph nodes appear relatively normal. Peripheral blood flow cytometry 05/13/2021-monoclonal lambda restricted B-cell population with expression of CD20, CD5, and CD200 Weight loss Early satiety Mild dysphagia Parkinson's   Disposition: [name] appears  unchanged.  The clinical presentation and peripheral blood flow cytometry are consistent with a diagnosis of chronic lymphocytic leukemia.  I discussed the prognosis and treatment options with [name] and his wife.  We discussed indications for treating CLL.  There is no clear indication for treatment at present.  He has weight loss, but I suspect the weight loss is more related to Parkinson's disease or another GI process.  He does not have other systemic symptoms from CLL.  He plans to follow-up with GI to evaluate the dysphagia.  I encouraged him to use nutrition supplements.  He will return for an office visit in 6 weeks.  He is an increased risk for infection with the diagnosis of CLL.  He will remain up-to-date on the pneumonia and influenza vaccines.  He will obtain a COVID-19 booster vaccine.  We discussed Evusheld and we will plan to administer this when he returns in 6 weeks.  Betsy Coder, MD  05/31/2021  11:51 AM

## 2021-06-01 ENCOUNTER — Other Ambulatory Visit: Payer: Self-pay | Admitting: *Deleted

## 2021-06-01 DIAGNOSIS — C9110 Chronic lymphocytic leukemia of B-cell type not having achieved remission: Secondary | ICD-10-CM

## 2021-06-01 DIAGNOSIS — C4401 Basal cell carcinoma of skin of lip: Secondary | ICD-10-CM | POA: Diagnosis not present

## 2021-06-02 DIAGNOSIS — Z23 Encounter for immunization: Secondary | ICD-10-CM | POA: Diagnosis not present

## 2021-06-11 ENCOUNTER — Ambulatory Visit
Admission: RE | Admit: 2021-06-11 | Discharge: 2021-06-11 | Disposition: A | Discharge status: Home / Self Care | Payer: Medicare Other | Source: Ambulatory Visit | Attending: Physician Assistant | Admitting: Physician Assistant

## 2021-06-11 ENCOUNTER — Other Ambulatory Visit: Payer: Self-pay | Admitting: Physician Assistant

## 2021-06-11 DIAGNOSIS — R1310 Dysphagia, unspecified: Secondary | ICD-10-CM | POA: Diagnosis not present

## 2021-06-11 DIAGNOSIS — K2281 Esophageal polyp: Secondary | ICD-10-CM | POA: Diagnosis not present

## 2021-06-15 ENCOUNTER — Telehealth (HOSPITAL_COMMUNITY): Payer: Self-pay | Admitting: *Deleted

## 2021-06-15 NOTE — Telephone Encounter (Signed)
Contacted patient to schedule OP MBS and was asked to call back week of 07/05/21 to schedule because he is going to be out of town for a few days. RKEEL

## 2021-06-16 ENCOUNTER — Other Ambulatory Visit: Payer: Self-pay | Admitting: Neurology

## 2021-06-16 DIAGNOSIS — G43109 Migraine with aura, not intractable, without status migrainosus: Secondary | ICD-10-CM | POA: Diagnosis not present

## 2021-06-16 DIAGNOSIS — G2581 Restless legs syndrome: Secondary | ICD-10-CM

## 2021-06-16 DIAGNOSIS — Z961 Presence of intraocular lens: Secondary | ICD-10-CM | POA: Diagnosis not present

## 2021-06-16 DIAGNOSIS — T8529XD Other mechanical complication of intraocular lens, subsequent encounter: Secondary | ICD-10-CM | POA: Diagnosis not present

## 2021-06-16 DIAGNOSIS — G20 Parkinson's disease: Secondary | ICD-10-CM

## 2021-06-16 DIAGNOSIS — R251 Tremor, unspecified: Secondary | ICD-10-CM

## 2021-06-24 DIAGNOSIS — R1310 Dysphagia, unspecified: Secondary | ICD-10-CM | POA: Diagnosis not present

## 2021-06-24 DIAGNOSIS — K2950 Unspecified chronic gastritis without bleeding: Secondary | ICD-10-CM | POA: Diagnosis not present

## 2021-07-07 ENCOUNTER — Telehealth (HOSPITAL_COMMUNITY): Payer: Self-pay

## 2021-07-07 NOTE — Telephone Encounter (Signed)
Called to follow up with patient to schedule OP MBS - patient stated Dr.Magod felt he did not need a MBS. Informed patient that if anything changes to reach out to Deliah Goody, [place] to place new order. Close current order.

## 2021-07-12 ENCOUNTER — Inpatient Hospital Stay: Payer: Medicare Other

## 2021-07-12 ENCOUNTER — Other Ambulatory Visit: Payer: Self-pay

## 2021-07-12 ENCOUNTER — Inpatient Hospital Stay: Payer: Medicare Other | Admitting: Oncology

## 2021-07-12 ENCOUNTER — Inpatient Hospital Stay: Payer: Medicare Other | Attending: Oncology

## 2021-07-12 ENCOUNTER — Other Ambulatory Visit: Payer: Self-pay | Admitting: Nurse Practitioner

## 2021-07-12 VITALS — BP 134/76 | HR 76 | Temp 97.8°F | Resp 20 | Ht 70.0 in | Wt 146.2 lb

## 2021-07-12 DIAGNOSIS — C9110 Chronic lymphocytic leukemia of B-cell type not having achieved remission: Secondary | ICD-10-CM

## 2021-07-12 LAB — CBC WITH DIFFERENTIAL (CANCER CENTER ONLY)
Abs Immature Granulocytes: 0.01 10*3/uL (ref 0.00–0.07)
Basophils Absolute: 0 10*3/uL (ref 0.0–0.1)
Basophils Relative: 1 %
Eosinophils Absolute: 0.1 10*3/uL (ref 0.0–0.5)
Eosinophils Relative: 1 %
HCT: 44.8 % (ref 39.0–52.0)
Hemoglobin: 14.4 g/dL (ref 13.0–17.0)
Immature Granulocytes: 0 %
Lymphocytes Relative: 57 %
Lymphs Abs: 4.2 10*3/uL — ABNORMAL HIGH (ref 0.7–4.0)
MCH: 30.1 pg (ref 26.0–34.0)
MCHC: 32.1 g/dL (ref 30.0–36.0)
MCV: 93.7 fL (ref 80.0–100.0)
Monocytes Absolute: 0.5 10*3/uL (ref 0.1–1.0)
Monocytes Relative: 7 %
Neutro Abs: 2.5 10*3/uL (ref 1.7–7.7)
Neutrophils Relative %: 34 %
Platelet Count: 151 10*3/uL (ref 150–400)
RBC: 4.78 MIL/uL (ref 4.22–5.81)
RDW: 13.8 % (ref 11.5–15.5)
WBC Count: 7.4 10*3/uL (ref 4.0–10.5)
nRBC: 0 % (ref 0.0–0.2)

## 2021-07-12 MED ORDER — TIXAGEVIMAB (PART OF EVUSHELD) INJECTION
300.0000 mg | Freq: Once | INTRAMUSCULAR | Status: AC
  Administered 2021-07-12: 13:00:00 300 mg via INTRAMUSCULAR

## 2021-07-12 MED ORDER — CILGAVIMAB (PART OF EVUSHELD) INJECTION
300.0000 mg | Freq: Once | INTRAMUSCULAR | Status: AC
  Administered 2021-07-12: 13:00:00 300 mg via INTRAMUSCULAR

## 2021-07-12 MED ORDER — PNEUMOCOCCAL VAC POLYVALENT 25 MCG/0.5ML IJ INJ: 0.5000 mL | INJECTION | Freq: Once | INTRAMUSCULAR | Status: DC

## 2021-07-12 NOTE — Progress Notes (Signed)
Patient monitored for 30 minutes post evusheld injection without any complications. Patient given education material on evusheld and all questions were answered. VSS upon leaving infusion room.

## 2021-07-12 NOTE — Progress Notes (Signed)
  [facility] OFFICE PROGRESS NOTE   Diagnosis: CLL  INTERVAL HISTORY:   Joshua Browning returns as scheduled.  No fever or night sweats.  Good appetite.  He reports improvement in dysphagia since undergoing an endoscopy by Dr. Watt Climes.  He reports the esophagus was "stretched ".  He aspirated barium at the time of an esophagram 06/11/2021.  The esophagus appeared normal.  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  Objective:  Vital signs in last 24 hours:  Blood pressure 134/76, pulse 76, temperature 97.8 F (36.6 C), temperature source Oral, resp. rate 20, height 5\' 10"  (1.778 m), weight 146 lb 3.2 oz (66.3 kg), SpO2 100 %.     Lymphatics: 1/2-1 cm bilateral low posterior cervical and left occipital nodes.  1 cm bilateral axillary nodes Resp: Lungs clear bilaterally Cardio: Regular rate and rhythm GI: No hepatosplenomegaly Vascular: No leg edema    Lab Results:  Lab Results  Component Value Date   WBC 7.4 07/12/2021   HGB 14.4 07/12/2021   HCT 44.8 07/12/2021   MCV 93.7 07/12/2021   PLT 151 07/12/2021   NEUTROABS 2.5 07/12/2021    CMP  Lab Results  Component Value Date   NA 142 05/13/2021   K 4.6 05/13/2021   CL 104 05/13/2021   CO2 31 05/13/2021   GLUCOSE 97 05/13/2021   BUN 14 05/13/2021   CREATININE 0.89 05/13/2021   CALCIUM 9.6 05/13/2021   PROT 6.9 05/13/2021   ALBUMIN 4.8 05/13/2021   AST 16 05/13/2021   ALT 5 05/13/2021   ALKPHOS 45 05/13/2021   BILITOT 0.5 05/13/2021   GFRNONAA >60 05/13/2021    Medications: I have reviewed the patient's current medications.   Assessment/Plan: CLL Small bilateral cervical and axillary lymph nodes CT neck 04/29/2021-subcentimeter but abnormally numerous bilateral cervical lymph nodes.  Largest node 8 to 9 mm.  Small bilateral axillary lymph nodes.  Stable mediastinal lymph nodes appear relatively normal. Peripheral blood flow cytometry 05/13/2021-monoclonal lambda  restricted B-cell population with expression of CD20, CD5, and CD200 Weight loss Early satiety Mild dysphagia Parkinson's     Disposition: Joshua Browning has CLL.  He is stable from a hematologic standpoint.  There is no indication for treating the CLL at present.  He will return for an office visit and CBC in 4 months.  Joshua Browning will receive Evusheld today.  He will be scheduled for a 23 valent pneumococcal vaccine when he is here in 4 months.  His weight has stabilized is undergoing a GI evaluation earlier this month.  We will follow-up on the endoscopy report from Dr. Watt Climes.  I continue to suspect the dysphagia and early satiety may be related to Parkinson's disease.  Betsy Coder, MD  07/12/2021  12:05 PM

## 2021-07-12 NOTE — Patient Instructions (Signed)
Tixagevimab; Cilg[employer]imab Solutions for Injection °What is this medication? °TIXAGEVIMAB; CILGAVIMAB (tix a jev i mab; sil gav i mab) reduces the risk of getting COVID-19 in persons with immune system problems who may not respond properly to the COVID-19 vaccine or persons who can't receive the COVID-19 vaccine. It belongs to a group of medications called monoclonal antibodies. It may decrease the risk of developing severe symptoms of COVID-19. It may also decrease the chance of going to the hospital. This medication is not approved by the FDA. The FDA has authorized emergency use of this medication during the COVID-19 pandemic. °This medicine may be used for other purposes; ask your health care provider or pharmacist if you have questions. °COMMON BRAND NAME(S): EVUSHELD °What should I tell my care team before I take this medication? °They need to know if you have any of these conditions: °any allergies °any serious illness °bleeding disorder °have received a COVID-19 vaccine °heart disease °an unusual or allergic reaction to tixagevimab, cilgavimab, other medications, foods, dyes, or preservatives °pregnant or trying to get pregnant °breast-feeding °How should I use this medication? °This medication is injected into a muscle. It is given by your care team in a hospital or clinic setting. °Talk to your care team about the use of this medication in children. While it may be given to children as young as 12 years, precautions do apply. °Overdosage: If you think you have taken too much of this medicine contact a poison control center or emergency room at once. °NOTE: This medicine is only for you. Do not share this medicine with others. °What if I miss a dose? °Keep appointments for follow-up doses. It is important not to miss your dose. Call your care team if you are unable to keep an appointment. °What may interact with this medication? °COVID-19 vaccines °This list may not describe all possible interactions. Give  your health care provider a list of all the medicines, herbs, non-prescription drugs, or dietary supplements you use. Also tell them if you smoke, drink alcohol, or use illegal drugs. Some items may interact with your medicine. °What should I watch for while using this medication? °Your condition will be monitored carefully while you are receiving this medication. Visit your care team for regular checks on your progress. Tell your care team if your symptoms do not start to get better or if they get worse. °After receiving this medication, wait at least 14 days before getting the COVID-19 vaccine. °What side effects may I notice from receiving this medication? °Side effects that you should report to your care team as soon as possible: °Allergic reactions--skin rash, itching, hives, swelling of the face, lips, tongue, or throat °Heart attack--pain or tightness in the chest, shoulders, arms, or jaw, nausea, shortness of breath, cold or clammy skin, feeling faint or lightheaded °Heart failure--shortness of breath, swelling of the ankles, feet, or hands, sudden weight gain, unusual weakness or fatigue °Heart rhythm changes--fast or irregular heartbeat, dizziness, feeling faint or lightheaded, chest pain, trouble breathing °Side effects that usually do not require medical attention (report these to your care team if they continue or are bothersome): °Cough °Fatigue °Headache °Pain, redness, or irritation at site where injected °This list may not describe all possible side effects. Call your doctor for medical advice about side effects. You may report side effects to FDA at 1-800-FDA-1088. °Where should I keep my medication? °This medication is given in a hospital or clinic. It will not be stored at home. °NOTE: This sheet is   a summary. It may not cover all possible information. If you have questions about this medicine, talk to your doctor, pharmacist, or health care provider. °© 2022 [employer] Standard (2020-07-23  00:00:00) ° °

## 2021-07-19 ENCOUNTER — Other Ambulatory Visit: Payer: Self-pay | Admitting: Neurology

## 2021-07-19 DIAGNOSIS — G20 Parkinson's disease: Secondary | ICD-10-CM

## 2021-07-19 DIAGNOSIS — G2581 Restless legs syndrome: Secondary | ICD-10-CM

## 2021-07-19 DIAGNOSIS — R251 Tremor, unspecified: Secondary | ICD-10-CM

## 2021-07-26 NOTE — Progress Notes (Signed)
Virtual Visit Via Video   The purpose of this virtual visit is to provide medical care while limiting exposure to the novel coronavirus.    Consent was obtained for video visit:  Yes.   Answered questions that patient had about telehealth interaction:  Yes.   I discussed the limitations, risks, security and privacy concerns of performing an evaluation and management service by telemedicine. I also discussed with the patient that there may be a patient responsible charge related to this service. The patient expressed understanding and agreed to proceed.  Pt location: Home Physician Location: office Name of referring provider:  Lawerance Cruel, MD I connected with Joshua Browning at patients initiation/request on 07/27/2021 at  3:00 PM EST by video enabled telemedicine application and verified that I am speaking with the correct person using two identifiers. Pt MRN:  [id number] Pt DOB:  [birth year] Video Participants:  Joshua Browning;  wife supplements history   Assessment/Plan:   1.  Parkinsons Disease   -Continue carbidopa/levodopa 25/100, 1.5 tablets 3 times per day  -Continue carbidopa/levodopa 50/200 at bedtime  -Continue clonazepam 0.5 mg, half tablet at bedtime for cramping and sleep.  2.  MCI  -Last neurocognitive testing was in 2017 with Dr. Si Raider.  I have not seen any significant change in him cognitively since that time.  3.  Mild depression/insomnia  -Doing well with mirtazapine, 15 mg at bedtime  4.  B12 deficiency  -On oral supplementation  5.  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  Patient following with ophthalmology.  6.  CLL  -Diagnosed September, 2022.  Following with oncology.  7.  Basal cell carcinoma  -Follows with dermatology.  Understands that Parkinson's slightly increases risk for melanoma.  8.  Weight loss  -Discussed with patient that it is doubtful is his weight loss is from Parkinson's disease.  His Parkinson's is far  too mild to cause weight loss.  In addition, Parkinson's disease does not generally cause early satiety, unless there is significant constipation associated with it.  His dysphagia resolved with stretching esophagus, so that wasn't from Parkinsons Disease.  Would strongly encourage looking for another etiology.    -he is already taking in extra protein  -on mirtazapine from me, which should, in theory, cause weight gain  Subjective:   Joshua Browning was seen today in follow up for Parkinsons disease.  My previous records were reviewed prior to todays visit as well as outside records available to me. Pt denies falls.  No lightheadedness or near syncope.  Unfortunately, the patient has been diagnosed with CLL since our last visit.  They felt that while he had weight loss, they felt it was due to Parkinson's disease or another GI process.  He is following with dermatology.  He had a Mohs surgery for basal cell.  He apparently saw GI and had an endoscopy and states that they stretched his esophagus.  Swallowing is "considerably better" since that time.    Current prescribed movement disorder medications: Carbidopa/levodopa 25/100, 1.5 tablets 3 times per day (slightly increased last visit) Carbidopa/levodopa 50/200 at bedtime Mirtazapine, 15 mg at bedtime Clonazepam 0.5 mg, half a tablet at bedtime  B12 supplement    ALLERGIES:  No Known Allergies  CURRENT MEDICATIONS:  Outpatient Encounter Medications as of 07/27/2021  Medication Sig   carbidopa-levodopa (SINEMET CR) 50-200 MG tablet 1 po q hs   carbidopa-levodopa (SINEMET IR) 25-100 MG tablet Take 1.5 tablets by mouth 3 (three)  times daily.   Cholecalciferol (VITAMIN D3) 1000 UNITS CAPS Take 1 capsule by mouth every evening.   clonazePAM (KLONOPIN) 0.5 MG tablet TAKE 1/2 TABLET BY MOUTH EVERY DAY AT BEDTIME   cyanocobalamin 1000 MCG tablet 1 tablet   famotidine (PEPCID) 20 MG tablet Take 20 mg by mouth at bedtime. Takes pepcid ac    Glucosamine Sulfate 1000 MG CAPS Take by mouth daily.    Magnesium 100 MG CAPS Take 1 capsule by mouth every evening.   mirtazapine (REMERON) 15 MG tablet TAKE 1 TABLET BY MOUTH EVERYDAY AT BEDTIME   Multiple Vitamins-Minerals (VITABASIC SENIOR PO) Take 1 tablet by mouth.   Potassium 99 MG TABS Take by mouth daily.   No facility-administered encounter medications on file as of 07/27/2021.    Objective:   PHYSICAL EXAMINATION:    VITALS:   Vitals:   07/27/21 1442  BP: 127/79  Weight: 144 lb (65.3 kg)  Height: 5\' 10"  (1.778 m)     GEN:  The patient appears stated age and is in NAD. HEENT:  Normocephalic, atraumatic.  The mucous membranes are moist.   Neurological examination:  Orientation: The patient is alert and oriented x3. Cranial nerves: There is good facial symmetry with minimal facial hypomimia. The speech is fluent and clear. Soft palate rises symmetrically and there is no tongue deviation. Hearing is intact to conversational tone. Motor: Strength is at least antigravity x4.  Movement examination: Abnormal movements: no tremor today Coordination:  There is no decremation with RAM's, with any form of RAMS, including alternating supination and pronation of the forearm, hand opening and closing, finger taps,   I have reviewed and interpreted the following labs independently    Chemistry      Component Value Date/Time   NA 142 05/13/2021 1512   K 4.6 05/13/2021 1512   CL 104 05/13/2021 1512   CO2 31 05/13/2021 1512   BUN 14 05/13/2021 1512   CREATININE 0.89 05/13/2021 1512   CREATININE 0.86 06/25/2019 1049      Component Value Date/Time   CALCIUM 9.6 05/13/2021 1512   ALKPHOS 45 05/13/2021 1512   AST 16 05/13/2021 1512   ALT 5 05/13/2021 1512   BILITOT 0.5 05/13/2021 1512       Lab Results  Component Value Date   WBC 7.4 07/12/2021   HGB 14.4 07/12/2021   HCT 44.8 07/12/2021   MCV 93.7 07/12/2021   PLT 151 07/12/2021    Lab Results  Component  Value Date   TSH 1.49 06/25/2019     Total time spent on today's visit was 20 minutes, including both face-to-face time and nonface-to-face time.  Time included that spent on review of records (prior notes available to me/labs/imaging if pertinent), discussing treatment and goals, answering patient's questions and coordinating care.  Cc:  Lawerance Cruel, MD

## 2021-07-27 ENCOUNTER — Encounter: Payer: Self-pay | Admitting: Neurology

## 2021-07-27 ENCOUNTER — Other Ambulatory Visit: Payer: Self-pay

## 2021-07-27 ENCOUNTER — Telehealth (INDEPENDENT_AMBULATORY_CARE_PROVIDER_SITE_OTHER): Payer: Medicare Other | Admitting: Neurology

## 2021-07-27 VITALS — BP 127/79 | Ht 70.0 in | Wt 144.0 lb

## 2021-07-27 DIAGNOSIS — G20 Parkinson's disease: Secondary | ICD-10-CM

## 2021-07-27 DIAGNOSIS — R634 Abnormal weight loss: Secondary | ICD-10-CM

## 2021-07-27 MED ORDER — CARBIDOPA-LEVODOPA ER 50-200 MG PO TBCR: EXTENDED_RELEASE_TABLET | ORAL | 1 refills | Status: DC

## 2021-09-20 ENCOUNTER — Other Ambulatory Visit: Payer: Self-pay | Admitting: Neurology

## 2021-09-20 DIAGNOSIS — G20 Parkinson's disease: Secondary | ICD-10-CM

## 2021-10-12 ENCOUNTER — Ambulatory Visit: Payer: Medicare Other | Admitting: Occupational Therapy

## 2021-10-12 ENCOUNTER — Telehealth: Payer: Self-pay | Admitting: Occupational Therapy

## 2021-10-12 ENCOUNTER — Ambulatory Visit: Payer: Medicare Other | Attending: Family Medicine

## 2021-10-12 ENCOUNTER — Ambulatory Visit: Payer: Medicare Other | Admitting: Physical Therapy

## 2021-10-12 ENCOUNTER — Other Ambulatory Visit: Payer: Self-pay

## 2021-10-12 DIAGNOSIS — G20 Parkinson's disease: Secondary | ICD-10-CM

## 2021-10-12 DIAGNOSIS — R29818 Other symptoms and signs involving the nervous system: Secondary | ICD-10-CM

## 2021-10-12 DIAGNOSIS — R471 Dysarthria and anarthria: Secondary | ICD-10-CM | POA: Insufficient documentation

## 2021-10-12 NOTE — Therapy (Signed)
[facility] [address] [place], [place], [address] Phone: [phone]   Fax:  [phone]  Patient Details  Name: Joshua Browning MRN: [id number] Date of Birth: [birth year] Referring Provider:  Lawerance Cruel, MD  Encounter Date: 10/12/2021    Occupational Therapy Parkinson's Disease Screen  Hand dominance:  right   Physical Performance Test item #4 (donning/doffing jacket):  23.53 sec  Fastening/unfastening 3 buttons in:  26.31 sec  9-hole peg test:    RUE  33.06 sec        LUE  33.25 sec  Box & Blocks Test:   RUE  42 blocks        LUE  39 blocks  Change in ability to perform ADLs/IADLs:  pt reports that writing fluctuates with difficulty with signature at times, "need a refresher putting shirts on"--trouble donning shirts   Pt would benefit from occupational therapy evaluation due to  slowing of coordination measures and incr difficulty with ADLs.   Vianne Bulls, [place] 10/12/2021, 1:12 PM  [facility] [address] [place], [place], [address] Phone: [phone]   Fax:  [place], OTR/L [facility] [address] [place], [place]  [address] [phone] phone [phone] 10/12/21 1:12 PM

## 2021-10-12 NOTE — Therapy (Signed)
[facility] [address] [place], [place], [address] Phone: [phone]   Fax:  [phone]  Patient Details  Name: Joshua Browning MRN: [id number] Date of Birth: [birth year] Referring Provider:  Alonza Bogus DO  Encounter Date: 10/12/2021  Speech Therapy Parkinson's Disease Screen   Decibel Level today: averaged low 70s dB  (WNL=70-72 dB) with range of mid 60s to low 70s dB, with sound level meter 30cm away from pt's masked mouth. Pt's conversational volume has relatively maintained since last treatment course. Pt reports good carryover of increased volume with limited repetitions requested at home. Pt and wife discussed and reported no overt concerns for speech at this time.   Pt does not report difficulty with swallowing or cognition warranting further evaluation.  Pt would likely benefit from speech therapy services at this time for further maintenance but pt elected to defer at this time. Recommend ST screen in another 6-8 months  Marzetta Board, [place] 10/12/2021, 1:05 PM  [facility] [address] [place], [place], [address] Phone: [phone]   Fax:  [phone]

## 2021-10-12 NOTE — Therapy (Signed)
[facility] [address] [place], [place], [address] Phone: [phone]   Fax:  [phone]  Patient Details  Name: Joshua Browning MRN: [id number] Date of Birth: 09-12-1945 Referring Provider:  Lawerance Cruel, MD  Encounter Date: 10/12/2021  Physical Therapy Parkinson's Disease Screen   Timed Up and Go test:8.78 seconds (previously 11.19 seconds)  10 meter walk test:9.84 seconds = 3.33 ft/sec  5 time sit to stand test: 10.87 seconds (previously 12.78 seconds)  Pt going to the [employer] moves classes every week and trying to walk as much as he can. No falls.    Patient does not require Physical Therapy services at this time.  Recommend Physical Therapy screen in 6 months.     Arliss Journey, PT, DPT  10/12/2021, 1:34 PM  [facility] [address] [place], [place], [address] Phone: [phone]   Fax:  [phone]

## 2021-10-12 NOTE — Telephone Encounter (Signed)
Dr. Wells Guiles Tat,   Joshua Browning was seen for Parkinsons OT, PT, and ST screens 10/12/21.   Pt may benefit from OT referral (due to slowing of coordination measures and incr difficulty with ADLs).   If you are in agreement, please send updated order for Occupational therapy via epic.  Thank you,  Vianne Bulls, OTR/L [facility] [address] [place], [place]  [address] [phone] phone [phone] 10/12/21 2:39 PM

## 2021-10-21 ENCOUNTER — Other Ambulatory Visit: Payer: Self-pay

## 2021-10-21 ENCOUNTER — Ambulatory Visit: Payer: Medicare Other | Attending: Family Medicine | Admitting: Occupational Therapy

## 2021-10-21 ENCOUNTER — Encounter: Payer: Self-pay | Admitting: Occupational Therapy

## 2021-10-21 DIAGNOSIS — R2681 Unsteadiness on feet: Secondary | ICD-10-CM

## 2021-10-21 DIAGNOSIS — M25621 Stiffness of right elbow, not elsewhere classified: Secondary | ICD-10-CM | POA: Diagnosis not present

## 2021-10-21 DIAGNOSIS — R29818 Other symptoms and signs involving the nervous system: Secondary | ICD-10-CM | POA: Diagnosis not present

## 2021-10-21 DIAGNOSIS — R278 Other lack of coordination: Secondary | ICD-10-CM | POA: Insufficient documentation

## 2021-10-21 DIAGNOSIS — M25611 Stiffness of right shoulder, not elsewhere classified: Secondary | ICD-10-CM | POA: Diagnosis not present

## 2021-10-21 DIAGNOSIS — R29898 Other symptoms and signs involving the musculoskeletal system: Secondary | ICD-10-CM | POA: Diagnosis not present

## 2021-10-21 DIAGNOSIS — R293 Abnormal posture: Secondary | ICD-10-CM

## 2021-10-21 DIAGNOSIS — R251 Tremor, unspecified: Secondary | ICD-10-CM | POA: Diagnosis not present

## 2021-10-21 DIAGNOSIS — M25622 Stiffness of left elbow, not elsewhere classified: Secondary | ICD-10-CM | POA: Diagnosis not present

## 2021-10-21 NOTE — Therapy (Signed)
[facility] [address] [place], [place], [address] Phone: [phone]   Fax:  [phone]  Occupational Therapy Evaluation  Patient Details  Name: LISSANDRO DILORENZO MRN: [id number] Date of Birth: 1946/01/18 Referring Provider (OT): Dr. Wells Guiles Tat   Encounter Date: 10/21/2021   OT End of Session - 10/21/21 1418     Visit Number 1    Number of Visits 17    Date for OT Re-Evaluation 12/20/21    Authorization Type [employer] Medicare    Authorization - Visit Number 1    Authorization - Number of Visits 10    Progress Note Due on Visit 10    OT Start Time [phone]    OT Stop Time 1020    OT Time Calculation (min) 46 min    Activity Tolerance Patient tolerated treatment well    Behavior During Therapy WFL for tasks assessed/performed             Past Medical History:  Diagnosis Date   BPH (benign prostatic hyperplasia)    Foley catheter in place    removed after turp   GERD (gastroesophageal reflux disease)    Nocturnal leg cramps    occ   Urinary retention    Wears glasses     Past Surgical History:  Procedure Laterality Date   CATARACT EXTRACTION W/ INTRAOCULAR LENS  IMPLANT, BILATERAL Bilateral    INGUINAL HERNIA REPAIR Bilateral RIGHT x 2 1779 umbilical done with last right   left last one done 10 yrs ago   RE-DO RIGHT INGUINAL HERNIA REPAIR AND UMBILICAL HERNIA REPAIR  2001   REMOVAL LEFT EYE INTRAOCULAR LENS AND REPLACEMENT   08-29-2013   TRANSURETHRAL RESECTION OF PROSTATE N/A 09/23/2013   Procedure: TRANSURETHRAL RESECTION OF THE PROSTATE (TURP) WITH GYRUS;  Surgeon: Claybon Jabs, MD;  Location: [facility];  Service: Urology;  Laterality: N/A;   VARICOCELECTOMY Left 07/02/2018   Procedure: VARICOCELECTOMY;  Surgeon: Kathie Rhodes, MD;  Location: [facility];  Service: Urology;  Laterality: Left;    There were no vitals filed for this visit.   Subjective Assessment - 10/21/21  0946     Subjective  "I don't feel as strong as I did before Covid"    Pertinent History Parkinson's Disease.  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 No/denies                [facility] OT Assessment - 10/21/21 0001       Assessment   Medical Diagnosis Parkinson's Disease    Referring Provider (OT) Dr. Wells Guiles Tat    Onset Date/Surgical Date 10/12/21   OT PD screen   Hand Dominance Right    Prior Therapy last OT 07/2020      Precautions   Precautions Fall      Balance Screen   Has the patient fallen in the past 6 months No      Home  Environment   Family/patient expects to be discharged to: Private residence    Lives With [place] Retired   retired from [employer] officer, political party), retired from [employer] (part-time)   Leisure [employer] class, walk with wife, some light weights, occasional therapy exercises.  Can't ride motorcycle anymore, [place] (hasn't recently), word searches      ADL   Eating/Feeding --   min diffiuclty with tremors with certain food, spaghetti, corn/peas   Grooming Modified independent   electric razor and toothbrush, wife assists sometimes with back of hair   Upper Body Bathing Modified independent   pt reports decr balance with washing hair   Lower Body Bathing --   mod I   Upper Body Dressing Increased time   difficulty donning shirt/jacket, difficulty with buttons   Lower Body Dressing Increased time    Toilet Transfer Modified independent    Toileting - Clothing Manipulation Modified independent    Toileting -  Hygiene Modified Independent    Tub/Shower Transfer Modified independent    Warden/ranger Grab bars;Walk in shower    Transfers/Ambulation Related to ADL's mod I car transfer, bed  mobility    ADL comments decr balance getting something from floor      IADL   Shopping Takes care of all shopping needs independently    Light Housekeeping --   does vacuuming, mopping   Meal Prep --   helps wife cook without difficulty   Programmer, applications own vehicle      Mobility   Mobility Status Independent      Written Expression   Dominant Hand Right    Handwriting --   pt reports difficulty, particularly with signature     Vision - History   Additional Comments Pt reports hx of bilateral cataract surgeries/implants and that R implant is slipping.  L implant already corrected.  Pt reports diplopia due to R implant and that he is closely monitored--with distance vision (tv, play, church) overlapping images at the bottom.      Activity Tolerance   Activity Tolerance Comments pt reports that he fatigues quicker now      Cognition   Overall Cognitive Status Impaired/Different from baseline    Bradyphrenia Yes    Cognition Comments pt reports word-finding deficits (observed today) and difficulty recalling names      Observation/Other Assessments   Standing Functional Reach Test R-12", L-13"    Other Surveys  Select    Physical Performance Test   Yes    Simulated Eating Time (seconds) 12.03   holds spoon at end, tremors noted   Donning Doffing Jacket Time (seconds) 20.85  with decr trunk rotation    Donning Doffing Jacket Comments fastening/unfastening 3 buttons in 31sec      Posture/Postural Control   Posture/Postural Control Postural limitations    Postural Limitations Rounded Shoulders;Forward head;Flexed trunk      Coordination   9 Hole Peg Test Right;Left    Right 9 Hole Peg Test 33.06sec (from screen 2/28)    Left 9 Hole Peg Test 33.25sec (from screen 10/12/21)    Box and Blocks R-42, L-34 blocks      Tone   Assessment Location Right Upper Extremity;Left Upper Extremity      ROM / Strength   AROM / PROM / Strength AROM      AROM   Overall AROM   Deficits    Overall AROM Comments R shoulder flex 110* with -20* elbow flex, L 120* with -30* elbow flex      RUE Tone   RUE Tone Mild      LUE Tone   LUE Tone Mild  OT Short Term Goals - 10/21/21 1424       OT SHORT TERM GOAL #1   Title Pt will be independent with updated PD-specific HEP.--check STGs 11/20/21    Time 4    Period Weeks    Status New      OT SHORT TERM GOAL #2   Title Pt will demo at least 120* R shoulder flex with at least -15* elbow ext for overhead reach.    Baseline 110* with -20* elbow flex    Time 4    Period Weeks    Status New      OT SHORT TERM GOAL #3   Title Pt will demo at least 120* L shoulder flex with at least -20* elbow ext for overhead reach.    Baseline 120* with -30* elbow flex    Time 4    Period Weeks    Status New      OT SHORT TERM GOAL #4   Title --      OT SHORT TERM GOAL #5   Title --               OT Long Term Goals - 10/21/21 1426       OT LONG TERM GOAL #1   Title Pt will verbalize understanding of adaptive strategies for ADLs/IADLs in order to incr ease, incr safety, and decr risk of future complications (including eating, dressing, picking up items from floor, vacuuming, writing).    Time 8    Period Weeks    Status New      OT LONG TERM GOAL #2   Title Pt will write a short paragraph with 100% legibility and no significant decrease in letter size    Time 8    Period Weeks    Status New      OT LONG TERM GOAL #3   Title Pt will demonstrate increased ease with dressing as eveidnced by decreasing PPT#4 (don/ doff jacket) to 18 secs or less    Baseline 20.85sec    Time 8    Period Weeks    Status New      OT LONG TERM GOAL #4   Title Pt will demonstrate improved ease with dressing as eveidenced by fastening/unfastening 3 buttons in 28 secs or less    Baseline 31sec    Time 8    Period Weeks    Status New      OT LONG TERM GOAL #5   Title Pt will  demo incr ease with eating as shown by performing PPT#2 in 10sec or less.    Baseline 12.03sec    Time 8    Period Weeks    Status New                   Plan - 10/21/21 1419     Clinical Impression Statement Pt is a 76 y.o. male with Parkinsons disease.   During multi-disciplinary PD screen on 10/12/21, occupational therapy was recommended due to incr difficulty with ADLs and slowed coordianation.   Pt with PMH that includes Chronic Lymphocytic Leukemia (newly diagnosed), BPH, GERD, Bilateral cataract Extraction with Intraocular lens implant.  Pt presents with bradykinesia, mild rigidity, decr coordination, tremor, abnormal posture, decr balance for ADLs, decr ROM.  Pt would benefit from occupational therapy to address these deficits in order to incr ease/efficiency with ADL/IADL performance, improve quality of life, and decr risk of future complications.    OT Occupational Profile and History Detailed  Assessment- Review of Records and additional review of physical, cognitive, psychosocial history related to current functional performance    Occupational performance deficits (Please refer to evaluation for details): ADL's;IADL's;Leisure    Body Structure / Function / Physical Skills ADL;Tone;Dexterity;Balance;UE functional use;Body mechanics;IADL;ROM;Endurance;Improper spinal/pelvic alignment;Coordination;Flexibility;Mobility;FMC    Cognitive Skills Memory    Rehab Potential Good    Clinical Decision Making Several treatment options, min-mod task modification necessary    Comorbidities Affecting Occupational Performance: May have comorbidities impacting occupational performance    Modification or Assistance to Complete Evaluation  Min-Moderate modification of tasks or assist with assess necessary to complete eval    OT Frequency 2x / week    OT Duration 8 weeks   +eval (for scheduling purposes, but anticipate d/c after 6 weeks)   OT Treatment/Interventions Self-care/ADL training;Moist  Heat;DME and/or AE instruction;Splinting;Balance training;Therapeutic activities;Therapeutic exercise;Cognitive remediation/compensation;Passive range of motion;Functional Mobility Training;Neuromuscular education;Cryotherapy;Energy conservation;Manual Therapy;Patient/family education             Patient will benefit from skilled therapeutic intervention in order to improve the following deficits and impairments:   Body Structure / Function / Physical Skills: ADL, Tone, Dexterity, Balance, UE functional use, Body mechanics, IADL, ROM, Endurance, Improper spinal/pelvic alignment, Coordination, Flexibility, Mobility, FMC Cognitive Skills: Memory     Visit Diagnosis: Other symptoms and signs involving the nervous system  Abnormal posture  Unsteadiness on feet  Other lack of coordination  Stiffness of right elbow, not elsewhere classified  Stiffness of left elbow, not elsewhere classified  Other symptoms and signs involving the musculoskeletal system  Tremor  Stiffness of right shoulder, not elsewhere classified    Problem List Patient Active Problem List   Diagnosis Date Noted   BPH (benign prostatic hypertrophy) with urinary retention 09/23/2013    [facility], OT 10/21/2021, 2:30 PM  [facility] [address] [place], [place], [address] Phone: [phone]   Fax:  [phone]  Name: ISIAAH CUERVO MRN: [id number] Date of Birth: [birth year]  Vianne Bulls, OTR/L [facility] [address] [place], [place]  [address] [phone] phone [phone] 10/21/21 2:30 PM

## 2021-10-26 ENCOUNTER — Ambulatory Visit: Payer: Medicare Other | Admitting: Occupational Therapy

## 2021-10-26 ENCOUNTER — Other Ambulatory Visit: Payer: Self-pay

## 2021-10-26 ENCOUNTER — Encounter: Payer: Self-pay | Admitting: Occupational Therapy

## 2021-10-26 DIAGNOSIS — M25611 Stiffness of right shoulder, not elsewhere classified: Secondary | ICD-10-CM | POA: Diagnosis not present

## 2021-10-26 DIAGNOSIS — R293 Abnormal posture: Secondary | ICD-10-CM

## 2021-10-26 DIAGNOSIS — M25621 Stiffness of right elbow, not elsewhere classified: Secondary | ICD-10-CM

## 2021-10-26 DIAGNOSIS — R29898 Other symptoms and signs involving the musculoskeletal system: Secondary | ICD-10-CM | POA: Diagnosis not present

## 2021-10-26 DIAGNOSIS — R251 Tremor, unspecified: Secondary | ICD-10-CM | POA: Diagnosis not present

## 2021-10-26 DIAGNOSIS — R278 Other lack of coordination: Secondary | ICD-10-CM | POA: Diagnosis not present

## 2021-10-26 DIAGNOSIS — R29818 Other symptoms and signs involving the nervous system: Secondary | ICD-10-CM

## 2021-10-26 DIAGNOSIS — R2681 Unsteadiness on feet: Secondary | ICD-10-CM | POA: Diagnosis not present

## 2021-10-26 DIAGNOSIS — M25622 Stiffness of left elbow, not elsewhere classified: Secondary | ICD-10-CM

## 2021-10-26 NOTE — Therapy (Signed)
[facility] ?[place], [place], [address] ?Phone: [phone]   Fax:  [phone] ? ?Occupational Therapy Treatment ? ?Patient Details  ?Name: Joshua Browning ?MRN: [id number] ?Date of Birth: 1946/01/30 ?Referring Provider (OT): Dr. Wells Guiles Tat ? ? ?Encounter Date: 10/26/2021 ? ? OT End of Session - 10/26/21 1246   ? ? Visit Number 2   ? Number of Visits 17   ? Date for OT Re-Evaluation 12/20/21   ? Authorization Type [employer] Medicare   ? Authorization - Visit Number 2   ? Authorization - Number of Visits 10   ? Progress Note Due on Visit 10   ? OT Start Time 1235   ? OT Stop Time 1315   ? OT Time Calculation (min) 40 min   ? Activity Tolerance Patient tolerated treatment well   ? Behavior During Therapy [facility] for tasks assessed/performed   ? ?  ?  ? ?  ? ? ?Past Medical History:  ?Diagnosis Date  ? BPH (benign prostatic hyperplasia)   ? Foley catheter in place   ? removed after turp  ? GERD (gastroesophageal reflux disease)   ? Nocturnal leg cramps   ? occ  ? Urinary retention   ? Wears glasses   ? ? ?Past Surgical History:  ?Procedure Laterality Date  ? CATARACT EXTRACTION W/ INTRAOCULAR LENS  IMPLANT, BILATERAL Bilateral   ? INGUINAL HERNIA REPAIR Bilateral RIGHT x 2 6720 umbilical done with last right  ? left last one done 10 yrs ago  ? RE-DO RIGHT INGUINAL HERNIA REPAIR AND UMBILICAL HERNIA REPAIR  2001  ? REMOVAL LEFT EYE INTRAOCULAR LENS AND REPLACEMENT   08-29-2013  ? TRANSURETHRAL RESECTION OF PROSTATE N/A 09/23/2013  ? Procedure: TRANSURETHRAL RESECTION OF THE PROSTATE (TURP) WITH GYRUS;  Surgeon: Claybon Jabs, MD;  Location: [facility];  Service: Urology;  Laterality: N/A;  ? VARICOCELECTOMY Left 07/02/2018  ? Procedure: VARICOCELECTOMY;  Surgeon: Kathie Rhodes, MD;  Location: [facility];  Service: Urology;  Laterality: Left;  ? ? ?There were no vitals filed for this visit. ? ? Subjective Assessment - 10/26/21  1246   ? ? Subjective  "Busy weekend"   ? Pertinent History Parkinson's Disease.  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 Patient Stated Goals improve ADLs (dressing), handwriting   ? Currently in Pain? No/denies   ? ?  ?  ? ?  ? ? ? ? ? ? ? ? ? ? ? ? ? OT Education - 10/26/21 1313   ? ? Education Details PWR! Hands (basic 4); PD Coordination HEP with focus on large amplitude movements; Large amplitude movement strategies for donning/doffing jacket (cape, L arm in first, pull and push RUE in, trunk rotation and large amplitude movements)   ? Person(s) Educated Patient   ? Methods Explanation;Demonstration;Verbal cues;Handout   ? Comprehension Verbalized understanding;Returned demonstration;Verbal cues required   ? ?  ?  ? ?  ? ? ? OT Short Term Goals - 10/21/21 1424   ? ?  ? OT SHORT TERM GOAL #1  ? Title Pt will be independent with updated PD-specific HEP.--check STGs 11/20/21   ? Time 4   ? Period Weeks   ? Status New   ?  ? OT SHORT TERM GOAL #2  ? Title Pt will demo at least 120* R shoulder flex with at least -15* elbow ext  for overhead reach.   ? Baseline 110* with -20* elbow flex   ? Time 4   ? Period Weeks   ? Status New   ?  ? OT SHORT TERM GOAL #3  ? Title Pt will demo at least 120* L shoulder flex with at least -20* elbow ext for overhead reach.   ? Baseline 120* with -30* elbow flex   ? Time 4   ? Period Weeks   ? Status New   ?  ? OT SHORT TERM GOAL #4  ? Title --   ?  ? OT SHORT TERM GOAL #5  ? Title --   ? ?  ?  ? ?  ? ? ? ? OT Long Term Goals - 10/21/21 1426   ? ?  ? OT LONG TERM GOAL #1  ? Title Pt will verbalize understanding of adaptive strategies for ADLs/IADLs in order to incr ease, incr safety, and decr risk of future complications (including eating, dressing, picking up items from floor, vacuuming, writing).   ? Time 8   ? Period Weeks   ? Status New   ?  ? OT LONG TERM GOAL #2  ? Title Pt  will write a short paragraph with 100% legibility and no significant decrease in letter size   ? Time 8   ? Period Weeks   ? Status New   ?  ? OT LONG TERM GOAL #3  ? Title Pt will demonstrate increased ease with dressing as eveidnced by decreasing PPT#4 (don/ doff jacket) to 18 secs or less   ? Baseline 20.85sec   ? Time 8   ? Period Weeks   ? Status New   ?  ? OT LONG TERM GOAL #4  ? Title Pt will demonstrate improved ease with dressing as eveidenced by fastening/unfastening 3 buttons in 28 secs or less   ? Baseline 31sec   ? Time 8   ? Period Weeks   ? Status New   ?  ? OT LONG TERM GOAL #5  ? Title Pt will demo incr ease with eating as shown by performing PPT#2 in 10sec or less.   ? Baseline 12.03sec   ? Time 8   ? Period Weeks   ? Status New   ? ?  ?  ? ?  ? ? ? ? ? ? ? ? Plan - 10/26/21 1247   ? ? Clinical Impression Statement Pt demo incr ease/efficiency with donning/doffing jacket with min cueing for large amplitude movement strategies.   ? OT Occupational Profile and History Detailed Assessment- Review of Records and additional review of physical, cognitive, psychosocial history related to current functional performance   ? Occupational performance deficits (Please refer to evaluation for details): ADL's;IADL's;Leisure   ? Body Structure / Function / Physical Skills ADL;Tone;Dexterity;Balance;UE functional use;Body mechanics;IADL;ROM;Endurance;Improper spinal/pelvic alignment;Coordination;Flexibility;Mobility;FMC   ? Cognitive Skills Memory   ? Rehab Potential Good   ? Clinical Decision Making Several treatment options, min-mod task modification necessary   ? Comorbidities Affecting Occupational Performance: May have comorbidities impacting occupational performance   ? Modification or Assistance to Complete Evaluation  Min-Moderate modification of tasks or assist with assess necessary to complete eval   ? OT Frequency 2x / week   ? OT Duration 8 weeks   +eval (for scheduling purposes, but anticipate d/c  after 6 weeks)  ? OT Treatment/Interventions Self-care/ADL training;Moist Heat;DME and/or AE instruction;Splinting;Balance training;Therapeutic activities;Therapeutic exercise;Cognitive remediation/compensation;Passive range of motion;Functional Mobility Training;Neuromuscular education;Cryotherapy;Energy conservation;Manual Therapy;Patient/family education   ?  Plan continue with large amplitude movement strategies for ADLs, functional reaching, writing   ? Consulted and Agree with Plan of Care Patient   ? ?  ?  ? ?  ? ? ?Patient will benefit from skilled therapeutic intervention in order to improve the following deficits and impairments:   ?Body Structure / Function / Physical Skills: ADL, Tone, Dexterity, Balance, UE functional use, Body mechanics, IADL, ROM, Endurance, Improper spinal/pelvic alignment, Coordination, Flexibility, Mobility, FMC ?Cognitive Skills: Memory ?  ? ? ?Visit Diagnosis: ?Other symptoms and signs involving the nervous system ? ?Abnormal posture ? ?Unsteadiness on feet ? ?Other lack of coordination ? ?Stiffness of right elbow, not elsewhere classified ? ?Stiffness of left elbow, not elsewhere classified ? ?Other symptoms and signs involving the musculoskeletal system ? ?Tremor ? ?Stiffness of right shoulder, not elsewhere classified ? ? ? ?Problem List ?Patient Active Problem List  ? Diagnosis Date Noted  ? BPH (benign prostatic hypertrophy) with urinary retention 09/23/2013  ? ? Vianne Bulls, [place] ?10/26/2021, 1:21 PM ? ?[facility] ?[place] ?[place], [place], [address] ?Phone: [phone]   Fax:  [phone] ? ?Name: Joshua Browning ?MRN: [id number] ?Date of Birth: Oct 01, 1945 ? ?Vianne Bulls, OTR/L ?[facility] ?[place], [place]  [address] ?[phone] phone ?[phone] ?10/26/21 1:21 PM ? ? ? ?

## 2021-10-26 NOTE — Patient Instructions (Addendum)
? ?  PWR! Hand Exercises ? ?Then, start with elbows bent and hands closed: ? ?PWR! Hands: Push hands out BIG. Elbows straight, wrists up, fingers open and spread apart BIG.  ? ?PWR! Step: Touch index finger to thumb while keeping other fingers straight. Flick fingers out BIG (thumb out/straighten fingers). Repeat with other fingers. (Step your thumb to each finger). ? ?With arms stretched out in front of you (elbows straight), perform the following: ? ?PWR! Rock:  Move wrists up and down BIG ? ?PWR! Twist: Twist palms up and down BIG ? ?** Make each movement big and deliberate so that you feel the movement. ? ?Perform at least 10 repetitions 1x/day, but perform PWR! Hands throughout the day when you are having trouble using your hands (picking up/manipulating small objects, writing, eating, typing, sewing, buttoning, etc.). ? ?Coordination Exercises ? ?Perform the following exercises for 20 minutes 1 times per day. Perform with both hand(s). Perform using big movements. ? ?Flipping Cards: Place deck of cards on the table. Flip cards over by opening your hand big to grasp and then turn your palm up big, opening hand fully to release. ?Deal cards: Hold 1/2 or whole deck in your hand. Use thumb to push card off top of deck with one big push. ?Rotate ball with fingertips: Pick up with fingers/thumb and move as much as you can with each turn/movement (clockwise and counter-clockwise). ?Toss ball in the air and catch with the same hand: Toss big/high.  Deliberately open with toss and deliberately close hand after catch. ?Pick up coins and stack one at a time: Open hand big and pick up with big, intentional movements. Do not drag coin to the edge. (5-10 in a stack) ?Pick up 5-10 coins one at a time and hold in palm. Then, move coins from palm to fingertips one at time and place in coin bank/container. ? ? ? ? ? ?

## 2021-10-28 ENCOUNTER — Ambulatory Visit: Payer: Medicare Other | Admitting: Occupational Therapy

## 2021-10-28 ENCOUNTER — Other Ambulatory Visit: Payer: Self-pay

## 2021-10-28 ENCOUNTER — Encounter: Payer: Self-pay | Admitting: Occupational Therapy

## 2021-10-28 DIAGNOSIS — R251 Tremor, unspecified: Secondary | ICD-10-CM | POA: Diagnosis not present

## 2021-10-28 DIAGNOSIS — R29898 Other symptoms and signs involving the musculoskeletal system: Secondary | ICD-10-CM | POA: Diagnosis not present

## 2021-10-28 DIAGNOSIS — M25621 Stiffness of right elbow, not elsewhere classified: Secondary | ICD-10-CM | POA: Diagnosis not present

## 2021-10-28 DIAGNOSIS — R293 Abnormal posture: Secondary | ICD-10-CM | POA: Diagnosis not present

## 2021-10-28 DIAGNOSIS — M25611 Stiffness of right shoulder, not elsewhere classified: Secondary | ICD-10-CM | POA: Diagnosis not present

## 2021-10-28 DIAGNOSIS — R2681 Unsteadiness on feet: Secondary | ICD-10-CM | POA: Diagnosis not present

## 2021-10-28 DIAGNOSIS — R278 Other lack of coordination: Secondary | ICD-10-CM | POA: Diagnosis not present

## 2021-10-28 DIAGNOSIS — R29818 Other symptoms and signs involving the nervous system: Secondary | ICD-10-CM | POA: Diagnosis not present

## 2021-10-28 DIAGNOSIS — M25622 Stiffness of left elbow, not elsewhere classified: Secondary | ICD-10-CM | POA: Diagnosis not present

## 2021-10-28 NOTE — Therapy (Signed)
[facility] ?[place] ?[place], [place], [address] ?Phone: [phone]   Fax:  [phone] ? ?Occupational Therapy Treatment ? ?Patient Details  ?Name: Joshua Browning ?MRN: [id number] ?Date of Birth: 1946/02/18 ?Referring Provider (OT): Dr. Wells Guiles Tat ? ? ?Encounter Date: 10/28/2021 ? ? OT End of Session - 10/28/21 0929   ? ? Visit Number 3   ? Number of Visits 17   ? Date for OT Re-Evaluation 12/20/21   ? Authorization Type [employer] Medicare   ? Authorization - Visit Number 3   ? Authorization - Number of Visits 10   ? Progress Note Due on Visit 10   ? OT Start Time [phone]   ? OT Stop Time 1015   ? OT Time Calculation (min) 42 min   ? Activity Tolerance Patient tolerated treatment well   ? Behavior During Therapy [facility] for tasks assessed/performed   ? ?  ?  ? ?  ? ? ?Past Medical History:  ?Diagnosis Date  ? BPH (benign prostatic hyperplasia)   ? Foley catheter in place   ? removed after turp  ? GERD (gastroesophageal reflux disease)   ? Nocturnal leg cramps   ? occ  ? Urinary retention   ? Wears glasses   ? ? ?Past Surgical History:  ?Procedure Laterality Date  ? CATARACT EXTRACTION W/ INTRAOCULAR LENS  IMPLANT, BILATERAL Bilateral   ? INGUINAL HERNIA REPAIR Bilateral RIGHT x 2 8185 umbilical done with last right  ? left last one done 10 yrs ago  ? RE-DO RIGHT INGUINAL HERNIA REPAIR AND UMBILICAL HERNIA REPAIR  2001  ? REMOVAL LEFT EYE INTRAOCULAR LENS AND REPLACEMENT   08-29-2013  ? TRANSURETHRAL RESECTION OF PROSTATE N/A 09/23/2013  ? Procedure: TRANSURETHRAL RESECTION OF THE PROSTATE (TURP) WITH GYRUS;  Surgeon: Claybon Jabs, MD;  Location: [facility];  Service: Urology;  Laterality: N/A;  ? VARICOCELECTOMY Left 07/02/2018  ? Procedure: VARICOCELECTOMY;  Surgeon: Kathie Rhodes, MD;  Location: [facility];  Service: Urology;  Laterality: Left;  ? ? ?There were no vitals filed for this visit. ? ? Subjective Assessment - 10/28/21  0925   ? ? Subjective  Pt reports that he did his HEP   ? Pertinent History Parkinson's Disease.  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 Patient Stated Goals improve ADLs (dressing), handwriting   ? Currently in Pain? No/denies   ? ?  ?  ? ?  ? ? ? ? ?Practiced writing sentences with good legibility and very minimal decr in size of letters at ends of lines ("e, o" more closed). X 8 sentences.  Then practiced "O" and "e" with focus on size and letter formation.  Then practiced signature with difficulty with cursive "r".  Continuous "r" with min difficulty prior to practicing name again with improvement noted.   Pt reports incr tremors today.   ? ?Practiced buttoning/unbuttoning shirt on table top with min cues for use of PWR! Hands prior to buttoning and use of deliberate/large amplitude movements for fastening and push-pull technique for unfastening after instruction.  Pt demo improvement with repetition and min cues for use of large amplitude movements. ? ?Practiced donning/doffing jacket using large amplitude movement strategies.  Pt demo improvement today with repetition and use/min cues for large amplitude movements. ? ?Simulated ADLs with bag with focus/min-mod cues for large amplitude movements:  Donning/doffing pull-over shirt, donning/doffing pants,  pulling bag into hand for clothing adjustment/donning socks. ? ?Practiced scooping dried beans for simulated eating with min cueing for strategies including:  deliberate movements and holding spoon in middle of handle.  Pt returned demo.   ? ? ? ? ? ? ? OT Short Term Goals - 10/21/21 1424   ? ?  ? OT SHORT TERM GOAL #1  ? Title Pt will be independent with updated PD-specific HEP.--check STGs 11/20/21   ? Time 4   ? Period Weeks   ? Status New   ?  ? OT SHORT TERM GOAL #2  ? Title Pt will demo at least 120* R shoulder flex with at least -15* elbow ext for  overhead reach.   ? Baseline 110* with -20* elbow flex   ? Time 4   ? Period Weeks   ? Status New   ?  ? OT SHORT TERM GOAL #3  ? Title Pt will demo at least 120* L shoulder flex with at least -20* elbow ext for overhead reach.   ? Baseline 120* with -30* elbow flex   ? Time 4   ? Period Weeks   ? Status New   ?  ? OT SHORT TERM GOAL #4  ? Title --   ?  ? OT SHORT TERM GOAL #5  ? Title --   ? ?  ?  ? ?  ? ? ? ? OT Long Term Goals - 10/21/21 1426   ? ?  ? OT LONG TERM GOAL #1  ? Title Pt will verbalize understanding of adaptive strategies for ADLs/IADLs in order to incr ease, incr safety, and decr risk of future complications (including eating, dressing, picking up items from floor, vacuuming, writing).   ? Time 8   ? Period Weeks   ? Status New   ?  ? OT LONG TERM GOAL #2  ? Title Pt will write a short paragraph with 100% legibility and no significant decrease in letter size   ? Time 8   ? Period Weeks   ? Status New   ?  ? OT LONG TERM GOAL #3  ? Title Pt will demonstrate increased ease with dressing as eveidnced by decreasing PPT#4 (don/ doff jacket) to 18 secs or less   ? Baseline 20.85sec   ? Time 8   ? Period Weeks   ? Status New   ?  ? OT LONG TERM GOAL #4  ? Title Pt will demonstrate improved ease with dressing as eveidenced by fastening/unfastening 3 buttons in 28 secs or less   ? Baseline 31sec   ? Time 8   ? Period Weeks   ? Status New   ?  ? OT LONG TERM GOAL #5  ? Title Pt will demo incr ease with eating as shown by performing PPT#2 in 10sec or less.   ? Baseline 12.03sec   ? Time 8   ? Period Weeks   ? Status New   ? ?  ?  ? ?  ? ? ? ? ? ? ? ? Plan - 10/28/21 0935   ? ? OT Occupational Profile and History Detailed Assessment- Review of Records and additional review of physical, cognitive, psychosocial history related to current functional performance   ? Occupational performance deficits (Please refer to evaluation for details): ADL's;IADL's;Leisure   ? Body Structure / Function / Physical Skills  ADL;Tone;Dexterity;Balance;UE functional use;Body mechanics;IADL;ROM;Endurance;Improper spinal/pelvic alignment;Coordination;Flexibility;Mobility;FMC   ? Cognitive Skills Memory   ? Rehab Potential Good   ?  Clinical Decision Making Several treatment options, min-mod task modification necessary   ? Comorbidities Affecting Occupational Performance: May have comorbidities impacting occupational performance   ? Modification or Assistance to Complete Evaluation  Min-Moderate modification of tasks or assist with assess necessary to complete eval   ? OT Frequency 2x / week   ? OT Duration 8 weeks   +eval (for scheduling purposes, but anticipate d/c after 6 weeks)  ? OT Treatment/Interventions Self-care/ADL training;Moist Heat;DME and/or AE instruction;Splinting;Balance training;Therapeutic activities;Therapeutic exercise;Cognitive remediation/compensation;Passive range of motion;Functional Mobility Training;Neuromuscular education;Cryotherapy;Energy conservation;Manual Therapy;Patient/family education   ? Plan continue with large amplitude movement strategies for ADLs, functional reaching, writing   ? Consulted and Agree with Plan of Care Patient   ? ?  ?  ? ?  ? ? ?Patient will benefit from skilled therapeutic intervention in order to improve the following deficits and impairments:   ?Body Structure / Function / Physical Skills: ADL, Tone, Dexterity, Balance, UE functional use, Body mechanics, IADL, ROM, Endurance, Improper spinal/pelvic alignment, Coordination, Flexibility, Mobility, FMC ?Cognitive Skills: Memory ?  ? ? ?Visit Diagnosis: ?Other symptoms and signs involving the nervous system ? ?Abnormal posture ? ?Unsteadiness on feet ? ?Other lack of coordination ? ?Stiffness of right elbow, not elsewhere classified ? ?Stiffness of left elbow, not elsewhere classified ? ?Other symptoms and signs involving the musculoskeletal system ? ?Tremor ? ?Stiffness of right shoulder, not elsewhere classified ? ? ? ?Problem  List ?Patient Active Problem List  ? Diagnosis Date Noted  ? BPH (benign prostatic hypertrophy) with urinary retention 09/23/2013  ? ? Vianne Bulls, [place] ?10/28/2021, 10:31 AM ? ?[place] ?[place]

## 2021-11-01 ENCOUNTER — Other Ambulatory Visit: Payer: Self-pay

## 2021-11-01 ENCOUNTER — Ambulatory Visit: Payer: Medicare Other | Admitting: Occupational Therapy

## 2021-11-01 ENCOUNTER — Encounter: Payer: Self-pay | Admitting: Occupational Therapy

## 2021-11-01 DIAGNOSIS — M25622 Stiffness of left elbow, not elsewhere classified: Secondary | ICD-10-CM

## 2021-11-01 DIAGNOSIS — M25621 Stiffness of right elbow, not elsewhere classified: Secondary | ICD-10-CM | POA: Diagnosis not present

## 2021-11-01 DIAGNOSIS — R278 Other lack of coordination: Secondary | ICD-10-CM

## 2021-11-01 DIAGNOSIS — R2681 Unsteadiness on feet: Secondary | ICD-10-CM

## 2021-11-01 DIAGNOSIS — R29818 Other symptoms and signs involving the nervous system: Secondary | ICD-10-CM

## 2021-11-01 DIAGNOSIS — R293 Abnormal posture: Secondary | ICD-10-CM | POA: Diagnosis not present

## 2021-11-01 DIAGNOSIS — R251 Tremor, unspecified: Secondary | ICD-10-CM

## 2021-11-01 DIAGNOSIS — M25611 Stiffness of right shoulder, not elsewhere classified: Secondary | ICD-10-CM | POA: Diagnosis not present

## 2021-11-01 DIAGNOSIS — R29898 Other symptoms and signs involving the musculoskeletal system: Secondary | ICD-10-CM

## 2021-11-01 NOTE — Therapy (Signed)
[place] ?[place] ?[place], [place], [address] ?Phone: [phone]   Fax:  [phone] ? ?Occupational Therapy Treatment ? ?Patient Details  ?Name: Joshua Browning ?MRN: [id number] ?Date of Birth: 1946/05/12 ?Referring Provider (OT): Dr. Wells Guiles Tat ? ? ?Encounter Date: 11/01/2021 ? ? OT End of Session - 11/01/21 1327   ? ? Visit Number 4   ? Number of Visits 17   ? Date for OT Re-Evaluation 12/20/21   ? Authorization Type U[employer] Medicare   ? Authorization - Visit Number 4   ? Authorization - Number of Visits 10   ? Progress Note Due on Visit 10   ? OT Start Time 1320   ? OT Stop Time 1400   ? OT Time Calculation (min) 40 min   ? Activity Tolerance Patient tolerated treatment well   ? Behavior During Therapy [facility] for tasks assessed/performed   ? ?  ?  ? ?  ? ? ?Past Medical History:  ?Diagnosis Date  ? BPH (benign prostatic hyperplasia)   ? Foley catheter in place   ? removed after turp  ? GERD (gastroesophageal reflux disease)   ? Nocturnal leg cramps   ? occ  ? Urinary retention   ? Wears glasses   ? ? ?Past Surgical History:  ?Procedure Laterality Date  ? CATARACT EXTRACTION W/ INTRAOCULAR LENS  IMPLANT, BILATERAL Bilateral   ? INGUINAL HERNIA REPAIR Bilateral RIGHT x 2 0086 umbilical done with last right  ? left last one done 10 yrs ago  ? RE-DO RIGHT INGUINAL HERNIA REPAIR AND UMBILICAL HERNIA REPAIR  2001  ? REMOVAL LEFT EYE INTRAOCULAR LENS AND REPLACEMENT   08-29-2013  ? TRANSURETHRAL RESECTION OF PROSTATE N/A 09/23/2013  ? Procedure: TRANSURETHRAL RESECTION OF THE PROSTATE (TURP) WITH GYRUS;  Surgeon: Claybon Jabs, MD;  Location: [place];  Service: Urology;  Laterality: N/A;  ? VARICOCELECTOMY Left 07/02/2018  ? Procedure: VARICOCELECTOMY;  Surgeon: Kathie Rhodes, MD;  Location: [facility];  Service: Urology;  Laterality: Left;  ? ? ?There were no vitals filed for this visit. ? ? Subjective Assessment - 11/01/21  1327   ? ? Subjective  Pt reports that he did his HEP   ? Pertinent History Parkinson's Disease.  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 Patient Stated Goals improve ADLs (dressing), handwriting   ? Currently in Pain? No/denies   ? ?  ?  ? ?  ? ? ? ?Practiced donning/doffing jacket using large amplitude movement strategies, min cues for large amplitude movements (like a cape, LUE first, pull jacket over before pushing RUE in). ? ?Practiced large amplitude movement strategies with simulated vacuuming and picking up items from the floor with min cues.   ? ?Coordination activities with min cueing for large amplitude movements with each hand:  flipping cards, dealing cards with thumb, sliding cards across table using PWR! Hands. ? ?Standing functional reaching to place/remove clothespins with trunk rotation/wt. Shift with min cueing for large amplitude movements and feet apart with each UE.   ?  ? ? ? ? ? ? OT Short Term Goals - 10/21/21 1424   ? ?  ? OT SHORT TERM GOAL #1  ? Title Pt will be independent with updated PD-specific HEP.--check STGs 11/20/21   ? Time 4   ? Period Weeks   ? Status New   ?  ? OT  SHORT TERM GOAL #2  ? Title Pt will demo at least 120* R shoulder flex with at least -15* elbow ext for overhead reach.   ? Baseline 110* with -20* elbow flex   ? Time 4   ? Period Weeks   ? Status New   ?  ? OT SHORT TERM GOAL #3  ? Title Pt will demo at least 120* L shoulder flex with at least -20* elbow ext for overhead reach.   ? Baseline 120* with -30* elbow flex   ? Time 4   ? Period Weeks   ? Status New   ?  ? OT SHORT TERM GOAL #4  ? Title --   ?  ? OT SHORT TERM GOAL #5  ? Title --   ? ?  ?  ? ?  ? ? ? ? OT Long Term Goals - 10/21/21 1426   ? ?  ? OT LONG TERM GOAL #1  ? Title Pt will verbalize understanding of adaptive strategies for ADLs/IADLs in order to incr ease, incr safety, and decr risk of future  complications (including eating, dressing, picking up items from floor, vacuuming, writing).   ? Time 8   ? Period Weeks   ? Status New   ?  ? OT LONG TERM GOAL #2  ? Title Pt will write a short paragraph with 100% legibility and no significant decrease in letter size   ? Time 8   ? Period Weeks   ? Status New   ?  ? OT LONG TERM GOAL #3  ? Title Pt will demonstrate increased ease with dressing as eveidnced by decreasing PPT#4 (don/ doff jacket) to 18 secs or less   ? Baseline 20.85sec   ? Time 8   ? Period Weeks   ? Status New   ?  ? OT LONG TERM GOAL #4  ? Title Pt will demonstrate improved ease with dressing as eveidenced by fastening/unfastening 3 buttons in 28 secs or less   ? Baseline 31sec   ? Time 8   ? Period Weeks   ? Status New   ?  ? OT LONG TERM GOAL #5  ? Title Pt will demo incr ease with eating as shown by performing PPT#2 in 10sec or less.   ? Baseline 12.03sec   ? Time 8   ? Period Weeks   ? Status New   ? ?  ?  ? ?  ? ? ? ? ? ? ? ? Plan - 11/01/21 1329   ? ? Clinical Impression Statement Pt is progressing towards goals with improving movement amplitude and coordination.   ? OT Occupational Profile and History Detailed Assessment- Review of Records and additional review of physical, cognitive, psychosocial history related to current functional performance   ? Occupational performance deficits (Please refer to evaluation for details): ADL's;IADL's;Leisure   ? Body Structure / Function / Physical Skills ADL;Tone;Dexterity;Balance;UE functional use;Body mechanics;IADL;ROM;Endurance;Improper spinal/pelvic alignment;Coordination;Flexibility;Mobility;FMC   ? Cognitive Skills Memory   ? Rehab Potential Good   ? Clinical Decision Making Several treatment options, min-mod task modification necessary   ? Comorbidities Affecting Occupational Performance: May have comorbidities impacting occupational performance   ? Modification or Assistance to Complete Evaluation  Min-Moderate modification of tasks or assist  with assess necessary to complete eval   ? OT Frequency 2x / week   ? OT Duration 8 weeks   +eval (for scheduling purposes, but anticipate d/c after 6 weeks)  ? OT Treatment/Interventions Self-care/ADL  training;Moist Heat;DME and/or AE instruction;Splinting;Balance training;Therapeutic activities;Therapeutic exercise;Cognitive remediation/compensation;Passive range of motion;Functional Mobility Training;Neuromuscular education;Cryotherapy;Energy conservation;Manual Therapy;Patient/family education   ? Plan continue with large amplitude movement strategies for ADLs, functional reaching overhead   ? Consulted and Agree with Plan of Care Patient   ? ?  ?  ? ?  ? ? ?Patient will benefit from skilled therapeutic intervention in order to improve the following deficits and impairments:   ?Body Structure / Function / Physical Skills: ADL, Tone, Dexterity, Balance, UE functional use, Body mechanics, IADL, ROM, Endurance, Improper spinal/pelvic alignment, Coordination, Flexibility, Mobility, FMC ?Cognitive Skills: Memory ?  ? ? ?Visit Diagnosis: ?Other symptoms and signs involving the nervous system ? ?Abnormal posture ? ?Unsteadiness on feet ? ?Other lack of coordination ? ?Stiffness of right elbow, not elsewhere classified ? ?Stiffness of left elbow, not elsewhere classified ? ?Other symptoms and signs involving the musculoskeletal system ? ?Tremor ? ?Stiffness of right shoulder, not elsewhere classified ? ? ? ?Problem List ?Patient Active Problem List  ? Diagnosis Date Noted  ? BPH (benign prostatic hypertrophy) with urinary retention 09/23/2013  ? ? Vianne Bulls, [place] ?11/01/2021, 2:12 PM ? ?[place] ?[place] ?[place], [place], [address] ?Phone: [phone]   Fax:  [phone] ? ?Name: Joshua Browning ?MRN: [id number] ?Date of Birth: [birth year] ? ?Vianne Bulls, OTR/L ?[place] ?[place], [place]   [address] ?[phone] phone ?[phone] ?11/01/21 2:12 PM ? ? ? ?

## 2021-11-03 ENCOUNTER — Ambulatory Visit: Payer: Medicare Other | Admitting: Occupational Therapy

## 2021-11-03 ENCOUNTER — Other Ambulatory Visit: Payer: Self-pay

## 2021-11-03 DIAGNOSIS — L821 Other seborrheic keratosis: Secondary | ICD-10-CM | POA: Diagnosis not present

## 2021-11-03 DIAGNOSIS — L570 Actinic keratosis: Secondary | ICD-10-CM | POA: Diagnosis not present

## 2021-11-03 DIAGNOSIS — R29898 Other symptoms and signs involving the musculoskeletal system: Secondary | ICD-10-CM | POA: Diagnosis not present

## 2021-11-03 DIAGNOSIS — M25621 Stiffness of right elbow, not elsewhere classified: Secondary | ICD-10-CM

## 2021-11-03 DIAGNOSIS — R29818 Other symptoms and signs involving the nervous system: Secondary | ICD-10-CM

## 2021-11-03 DIAGNOSIS — R2681 Unsteadiness on feet: Secondary | ICD-10-CM

## 2021-11-03 DIAGNOSIS — Z85828 Personal history of other malignant neoplasm of skin: Secondary | ICD-10-CM | POA: Diagnosis not present

## 2021-11-03 DIAGNOSIS — R278 Other lack of coordination: Secondary | ICD-10-CM | POA: Diagnosis not present

## 2021-11-03 DIAGNOSIS — R251 Tremor, unspecified: Secondary | ICD-10-CM

## 2021-11-03 DIAGNOSIS — M25622 Stiffness of left elbow, not elsewhere classified: Secondary | ICD-10-CM

## 2021-11-03 DIAGNOSIS — L814 Other melanin hyperpigmentation: Secondary | ICD-10-CM | POA: Diagnosis not present

## 2021-11-03 DIAGNOSIS — M25611 Stiffness of right shoulder, not elsewhere classified: Secondary | ICD-10-CM

## 2021-11-03 DIAGNOSIS — R293 Abnormal posture: Secondary | ICD-10-CM

## 2021-11-03 DIAGNOSIS — D1801 Hemangioma of skin and subcutaneous tissue: Secondary | ICD-10-CM | POA: Diagnosis not present

## 2021-11-03 NOTE — Therapy (Signed)
[place] ?[place] ?[place], [place], [address] ?Phone: [phone]   Fax:  [phone] ? ?Occupational Therapy Treatment ? ?Patient Details  ?Name: Joshua Browning ?MRN: [id number] ?Date of Birth: May 14, 1946 ?Referring Provider (OT): Dr. Wells Guiles Tat ? ? ?Encounter Date: 11/03/2021 ? ? OT End of Session - 11/03/21 1500   ? ? Visit Number 5   ? Number of Visits 17   ? Date for OT Re-Evaluation 12/20/21   ? Authorization Type UHC Medicare   ? Authorization - Visit Number 5   ? Authorization - Number of Visits 10   ? Progress Note Due on Visit 10   ? OT Start Time 1402   ? OT Stop Time 0938   ? OT Time Calculation (min) 43 min   ? Activity Tolerance Patient tolerated treatment well   ? Behavior During Therapy [facility] for tasks assessed/performed   ? ?  ?  ? ?  ? ? ?Past Medical History:  ?Diagnosis Date  ? BPH (benign prostatic hyperplasia)   ? Foley catheter in place   ? removed after turp  ? GERD (gastroesophageal reflux disease)   ? Nocturnal leg cramps   ? occ  ? Urinary retention   ? Wears glasses   ? ? ?Past Surgical History:  ?Procedure Laterality Date  ? CATARACT EXTRACTION W/ INTRAOCULAR LENS  IMPLANT, BILATERAL Bilateral   ? INGUINAL HERNIA REPAIR Bilateral RIGHT x 2 1829 umbilical done with last right  ? left last one done 10 yrs ago  ? RE-DO RIGHT INGUINAL HERNIA REPAIR AND UMBILICAL HERNIA REPAIR  2001  ? REMOVAL LEFT EYE INTRAOCULAR LENS AND REPLACEMENT   08-29-2013  ? TRANSURETHRAL RESECTION OF PROSTATE N/A 09/23/2013  ? Procedure: TRANSURETHRAL RESECTION OF THE PROSTATE (TURP) WITH GYRUS;  Surgeon: Claybon Jabs, MD;  Location: [place];  Service: Urology;  Laterality: N/A;  ? VARICOCELECTOMY Left 07/02/2018  ? Procedure: VARICOCELECTOMY;  Surgeon: Kathie Rhodes, MD;  Location: [facility];  Service: Urology;  Laterality: Left;  ? ? ?There were no vitals filed for this visit. ? ? Subjective Assessment - 11/03/21  1458   ? ? Subjective  Pt reports it has been a very busy day   ? Pertinent History Parkinson's Disease.  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 Patient Stated Goals improve ADLs (dressing), handwriting   ? Currently in Pain? No/denies   ? ?  ?  ? ?  ? ? ? ? ? ? ? ? ? ? ? ? ? ? ?Treatment: Bag exercises for simulated ADLS, simulated pulling up socks, donning pants and donning shirt, min v.c for posture ?PWR! Rock at [employer], modified quadraped followed by dynamic step and reach with let and right UE's to place clothespins on targets, min v.c for step length, posture and amplitude. ?PWR! Hands basic 4, min v.c for performance and posture ?Handwriting activities: writing "o", continuous"l" and "r" then writing sentence with min v.c for letter size ? ? ? ? ? ? ? ? ? OT Short Term Goals - 10/21/21 1424   ? ?  ? OT SHORT TERM GOAL #1  ? Title Pt will be independent with updated PD-specific HEP.--check STGs 11/20/21   ? Time 4   ? Period Weeks   ? Status New   ?  ? OT SHORT TERM GOAL #2  ? Title Pt will demo at  least 120* R shoulder flex with at least -15* elbow ext for overhead reach.   ? Baseline 110* with -20* elbow flex   ? Time 4   ? Period Weeks   ? Status New   ?  ? OT SHORT TERM GOAL #3  ? Title Pt will demo at least 120* L shoulder flex with at least -20* elbow ext for overhead reach.   ? Baseline 120* with -30* elbow flex   ? Time 4   ? Period Weeks   ? Status New   ?  ? OT SHORT TERM GOAL #4  ? Title --   ?  ? OT SHORT TERM GOAL #5  ? Title --   ? ?  ?  ? ?  ? ? ? ? OT Long Term Goals - 10/21/21 1426   ? ?  ? OT LONG TERM GOAL #1  ? Title Pt will verbalize understanding of adaptive strategies for ADLs/IADLs in order to incr ease, incr safety, and decr risk of future complications (including eating, dressing, picking up items from floor, vacuuming, writing).   ? Time 8   ? Period Weeks   ? Status New   ?   ? OT LONG TERM GOAL #2  ? Title Pt will write a short paragraph with 100% legibility and no significant decrease in letter size   ? Time 8   ? Period Weeks   ? Status New   ?  ? OT LONG TERM GOAL #3  ? Title Pt will demonstrate increased ease with dressing as eveidnced by decreasing PPT#4 (don/ doff jacket) to 18 secs or less   ? Baseline 20.85sec   ? Time 8   ? Period Weeks   ? Status New   ?  ? OT LONG TERM GOAL #4  ? Title Pt will demonstrate improved ease with dressing as eveidenced by fastening/unfastening 3 buttons in 28 secs or less   ? Baseline 31sec   ? Time 8   ? Period Weeks   ? Status New   ?  ? OT LONG TERM GOAL #5  ? Title Pt will demo incr ease with eating as shown by performing PPT#2 in 10sec or less.   ? Baseline 12.03sec   ? Time 8   ? Period Weeks   ? Status New   ? ?  ?  ? ?  ? ? ? ? ? ? ? ? Plan - 11/03/21 1405   ? ? Clinical Impression Statement Pt is progressing towards goals. He demonstrates improving ability to donn/ doff jacket.   ? OT Occupational Profile and History Detailed Assessment- Review of Records and additional review of physical, cognitive, psychosocial history related to current functional performance   ? Occupational performance deficits (Please refer to evaluation for details): ADL's;IADL's;Leisure   ? Body Structure / Function / Physical Skills ADL;Tone;Dexterity;Balance;UE functional use;Body mechanics;IADL;ROM;Endurance;Improper spinal/pelvic alignment;Coordination;Flexibility;Mobility;FMC   ? Cognitive Skills Memory   ? Rehab Potential Good   ? Clinical Decision Making Several treatment options, min-mod task modification necessary   ? Comorbidities Affecting Occupational Performance: May have comorbidities impacting occupational performance   ? Modification or Assistance to Complete Evaluation  Min-Moderate modification of tasks or assist with assess necessary to complete eval   ? OT Frequency 2x / week   ? OT Duration 8 weeks   +eval (for scheduling purposes, but  anticipate d/c after 6 weeks)  ? OT Treatment/Interventions Self-care/ADL training;Moist Heat;DME and/or AE instruction;Splinting;Balance training;Therapeutic activities;Therapeutic exercise;Cognitive remediation/compensation;Passive  range of motion;Functional Mobility Training;Neuromuscular education;Cryotherapy;Energy conservation;Manual Therapy;Patient/family education   ? Plan continue with large amplitude movement strategies for ADLs, functional reaching overhead   ? Consulted and Agree with Plan of Care Patient   ? ?  ?  ? ?  ? ? ?Patient will benefit from skilled therapeutic intervention in order to improve the following deficits and impairments:   ?Body Structure / Function / Physical Skills: ADL, Tone, Dexterity, Balance, UE functional use, Body mechanics, IADL, ROM, Endurance, Improper spinal/pelvic alignment, Coordination, Flexibility, Mobility, FMC ?Cognitive Skills: Memory ?  ? ? ?Visit Diagnosis: ?Other symptoms and signs involving the nervous system ? ?Other lack of coordination ? ?Stiffness of right elbow, not elsewhere classified ? ?Stiffness of left elbow, not elsewhere classified ? ?Other symptoms and signs involving the musculoskeletal system ? ?Tremor ? ?Stiffness of right shoulder, not elsewhere classified ? ?Unsteadiness on feet ? ?Abnormal posture ? ? ? ?Problem List ?Patient Active Problem List  ? Diagnosis Date Noted  ? BPH (benign prostatic hypertrophy) with urinary retention 09/23/2013  ? ? ?Corrisa Gibby, OT ?11/03/2021, 3:01 PM ? ?[place] ?[place] ?[place], [place], [address] ?Phone: [phone]   Fax:  [phone] ? ?Name: Joshua Browning ?MRN: [id number] ?Date of Birth: 1945/10/08 ? ?

## 2021-11-08 ENCOUNTER — Encounter: Payer: Self-pay | Admitting: Occupational Therapy

## 2021-11-08 ENCOUNTER — Ambulatory Visit: Payer: Medicare Other | Admitting: Occupational Therapy

## 2021-11-08 ENCOUNTER — Other Ambulatory Visit: Payer: Self-pay

## 2021-11-08 DIAGNOSIS — R293 Abnormal posture: Secondary | ICD-10-CM | POA: Diagnosis not present

## 2021-11-08 DIAGNOSIS — R2681 Unsteadiness on feet: Secondary | ICD-10-CM

## 2021-11-08 DIAGNOSIS — M25621 Stiffness of right elbow, not elsewhere classified: Secondary | ICD-10-CM

## 2021-11-08 DIAGNOSIS — M25611 Stiffness of right shoulder, not elsewhere classified: Secondary | ICD-10-CM | POA: Diagnosis not present

## 2021-11-08 DIAGNOSIS — R251 Tremor, unspecified: Secondary | ICD-10-CM

## 2021-11-08 DIAGNOSIS — R278 Other lack of coordination: Secondary | ICD-10-CM | POA: Diagnosis not present

## 2021-11-08 DIAGNOSIS — R29818 Other symptoms and signs involving the nervous system: Secondary | ICD-10-CM | POA: Diagnosis not present

## 2021-11-08 DIAGNOSIS — M25622 Stiffness of left elbow, not elsewhere classified: Secondary | ICD-10-CM

## 2021-11-08 DIAGNOSIS — R29898 Other symptoms and signs involving the musculoskeletal system: Secondary | ICD-10-CM | POA: Diagnosis not present

## 2021-11-08 NOTE — Therapy (Signed)
[place] ?[place] ?[place], [place], [address] ?Phone: [phone]   Fax:  [phone] ? ?Occupational Therapy Treatment ? ?Patient Details  ?Name: Joshua Browning ?MRN: [id number] ?Date of Birth: [birth year] ?Referring Provider (OT): Dr. Wells Guiles Tat ? ? ?Encounter Date: 11/08/2021 ? ? OT End of Session - 11/08/21 1307   ? ? Visit Number 6   ? Number of Visits 17   ? Date for OT Re-Evaluation 12/20/21   ? Authorization Type UHC Medicare   ? Authorization - Visit Number 6   ? Authorization - Number of Visits 10   ? Progress Note Due on Visit 10   ? OT Start Time 1319   ? OT Stop Time 1400   ? OT Time Calculation (min) 41 min   ? Activity Tolerance Patient tolerated treatment well   ? Behavior During Therapy [facility] for tasks assessed/performed   ? ?  ?  ? ?  ? ? ?Past Medical History:  ?Diagnosis Date  ? BPH (benign prostatic hyperplasia)   ? Foley catheter in place   ? removed after turp  ? GERD (gastroesophageal reflux disease)   ? Nocturnal leg cramps   ? occ  ? Urinary retention   ? Wears glasses   ? ? ?Past Surgical History:  ?Procedure Laterality Date  ? CATARACT EXTRACTION W/ INTRAOCULAR LENS  IMPLANT, BILATERAL Bilateral   ? INGUINAL HERNIA REPAIR Bilateral RIGHT x 2 2878 umbilical done with last right  ? left last one done 10 yrs ago  ? RE-DO RIGHT INGUINAL HERNIA REPAIR AND UMBILICAL HERNIA REPAIR  2001  ? REMOVAL LEFT EYE INTRAOCULAR LENS AND REPLACEMENT   08-29-2013  ? TRANSURETHRAL RESECTION OF PROSTATE N/A 09/23/2013  ? Procedure: TRANSURETHRAL RESECTION OF THE PROSTATE (TURP) WITH GYRUS;  Surgeon: Claybon Jabs, MD;  Location: [place];  Service: Urology;  Laterality: N/A;  ? VARICOCELECTOMY Left 07/02/2018  ? Procedure: VARICOCELECTOMY;  Surgeon: Kathie Rhodes, MD;  Location[facility];  Service: Urology;  Laterality: Left;  ? ? ?There were no vitals filed for this visit. ? ? Subjective Assessment - 11/08/21  1307   ? ? Subjective  tired today, Pt reports that he went to [place] over the weekend   ? Pertinent History Parkinson's Disease.  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 Patient Stated Goals improve ADLs (dressing), handwriting   ? Currently in Pain? No/denies   ? ?  ?  ? ?  ? ? ? ?Coordination activities with min cueing for large amplitude movements with each hand:  dealing cards with thumb, sliding cards across table using PWR! Hands, picking up coins and stacking with use of PWR! Hands, manipulating coins to place in coin bank 1 at a time. ? ?Standing functional step and reach to flip cards with trunk rotation/wt. Shift laterally with min cueing for large amplitude movements with each UE.   ? ?Standing functional step and reach to place large pegs in vertical pegboard with trunk rotation and forward/backward wt. Shift with min cueing for large amplitude movements with each UE.   ? ?Practiced fastening/unfastening buttons using large amplitude movements/adaptive strategies with min v.c. ? ? ? ? ? OT Short Term Goals - 10/21/21 1424   ? ?  ? OT SHORT TERM GOAL #1  ? Title Pt will be independent with updated PD-specific HEP.--check STGs 11/20/21   ? Time  4   ? Period Weeks   ? Status New   ?  ? OT SHORT TERM GOAL #2  ? Title Pt will demo at least 120* R shoulder flex with at least -15* elbow ext for overhead reach.   ? Baseline 110* with -20* elbow flex   ? Time 4   ? Period Weeks   ? Status New   ?  ? OT SHORT TERM GOAL #3  ? Title Pt will demo at least 120* L shoulder flex with at least -20* elbow ext for overhead reach.   ? Baseline 120* with -30* elbow flex   ? Time 4   ? Period Weeks   ? Status New   ?  ? OT SHORT TERM GOAL #4  ? Title --   ?  ? OT SHORT TERM GOAL #5  ? Title --   ? ?  ?  ? ?  ? ? ? ? OT Long Term Goals - 10/21/21 1426   ? ?  ? OT LONG TERM GOAL #1  ? Title Pt will verbalize understanding of  adaptive strategies for ADLs/IADLs in order to incr ease, incr safety, and decr risk of future complications (including eating, dressing, picking up items from floor, vacuuming, writing).   ? Time 8   ? Period Weeks   ? Status New   ?  ? OT LONG TERM GOAL #2  ? Title Pt will write a short paragraph with 100% legibility and no significant decrease in letter size   ? Time 8   ? Period Weeks   ? Status New   ?  ? OT LONG TERM GOAL #3  ? Title Pt will demonstrate increased ease with dressing as eveidnced by decreasing PPT#4 (don/ doff jacket) to 18 secs or less   ? Baseline 20.85sec   ? Time 8   ? Period Weeks   ? Status New   ?  ? OT LONG TERM GOAL #4  ? Title Pt will demonstrate improved ease with dressing as eveidenced by fastening/unfastening 3 buttons in 28 secs or less   ? Baseline 31sec   ? Time 8   ? Period Weeks   ? Status New   ?  ? OT LONG TERM GOAL #5  ? Title Pt will demo incr ease with eating as shown by performing PPT#2 in 10sec or less.   ? Baseline 12.03sec   ? Time 8   ? Period Weeks   ? Status New   ? ?  ?  ? ?  ? ? ? ? ? ? ? ? Plan - 11/08/21 1307   ? ? Clinical Impression Statement Pt is progressing toward goals with improving coordination.   ? OT Occupational Profile and History Detailed Assessment- Review of Records and additional review of physical, cognitive, psychosocial history related to current functional performance   ? Occupational performance deficits (Please refer to evaluation for details): ADL's;IADL's;Leisure   ? Body Structure / Function / Physical Skills ADL;Tone;Dexterity;Balance;UE functional use;Body mechanics;IADL;ROM;Endurance;Improper spinal/pelvic alignment;Coordination;Flexibility;Mobility;FMC   ? Cognitive Skills Memory   ? Rehab Potential Good   ? Clinical Decision Making Several treatment options, min-mod task modification necessary   ? Comorbidities Affecting Occupational Performance: May have comorbidities impacting occupational performance   ? Modification or  Assistance to Complete Evaluation  Min-Moderate modification of tasks or assist with assess necessary to complete eval   ? OT Frequency 2x / week   ? OT Duration 8 weeks   +eval (  for scheduling purposes, but anticipate d/c after 6 weeks)  ? OT Treatment/Interventions Self-care/ADL training;Moist Heat;DME and/or AE instruction;Splinting;Balance training;Therapeutic activities;Therapeutic exercise;Cognitive remediation/compensation;Passive range of motion;Functional Mobility Training;Neuromuscular education;Cryotherapy;Energy conservation;Manual Therapy;Patient/family education   ? Plan continue with large amplitude movement strategies for ADLs, functional reaching overhead, dual task with movements   ? Consulted and Agree with Plan of Care Patient   ? ?  ?  ? ?  ? ? ?Patient will benefit from skilled therapeutic intervention in order to improve the following deficits and impairments:   ?Body Structure / Function / Physical Skills: ADL, Tone, Dexterity, Balance, UE functional use, Body mechanics, IADL, ROM, Endurance, Improper spinal/pelvic alignment, Coordination, Flexibility, Mobility, FMC ?Cognitive Skills: Memory ?  ? ? ?Visit Diagnosis: ?Other symptoms and signs involving the nervous system ? ?Other lack of coordination ? ?Stiffness of right elbow, not elsewhere classified ? ?Stiffness of left elbow, not elsewhere classified ? ?Other symptoms and signs involving the musculoskeletal system ? ?Tremor ? ?Stiffness of right shoulder, not elsewhere classified ? ?Unsteadiness on feet ? ?Abnormal posture ? ? ? ?Problem List ?Patient Active Problem List  ? Diagnosis Date Noted  ? BPH (benign prostatic hypertrophy) with urinary retention 09/23/2013  ? ? Vianne Bulls, [place] ?11/08/2021, 2:12 PM ? ?[place] ?[place] ?[place], [place], [address] ?Phone: [phone]   Fax:  [phone] ? ?Name: Joshua Browning ?MRN: [id number] ?Date of Birth:  1946-04-19 ? ? ?Vianne Bulls, OTR/L ?[place] ?[place], [place]  [address] ?[phone] phone ?[phone] ?11/08/21 2:12 PM ? ? ?

## 2021-11-09 ENCOUNTER — Other Ambulatory Visit (HOSPITAL_BASED_OUTPATIENT_CLINIC_OR_DEPARTMENT_OTHER): Payer: Self-pay

## 2021-11-09 ENCOUNTER — Inpatient Hospital Stay: Payer: Medicare Other | Attending: Oncology

## 2021-11-09 ENCOUNTER — Inpatient Hospital Stay: Payer: Medicare Other

## 2021-11-09 ENCOUNTER — Inpatient Hospital Stay: Payer: Medicare Other | Admitting: Oncology

## 2021-11-09 VITALS — BP 150/72 | HR 61 | Temp 97.8°F | Resp 20 | Ht 70.0 in | Wt 147.6 lb

## 2021-11-09 DIAGNOSIS — C9110 Chronic lymphocytic leukemia of B-cell type not having achieved remission: Secondary | ICD-10-CM | POA: Diagnosis not present

## 2021-11-09 LAB — CBC WITH DIFFERENTIAL (CANCER CENTER ONLY)
Abs Immature Granulocytes: 0.02 10*3/uL (ref 0.00–0.07)
Basophils Absolute: 0 10*3/uL (ref 0.0–0.1)
Basophils Relative: 1 %
Eosinophils Absolute: 0.1 10*3/uL (ref 0.0–0.5)
Eosinophils Relative: 2 %
HCT: 44.2 % (ref 39.0–52.0)
Hemoglobin: 14.4 g/dL (ref 13.0–17.0)
Immature Granulocytes: 0 %
Lymphocytes Relative: 60 %
Lymphs Abs: 4.8 10*3/uL — ABNORMAL HIGH (ref 0.7–4.0)
MCH: 30.3 pg (ref 26.0–34.0)
MCHC: 32.6 g/dL (ref 30.0–36.0)
MCV: 93.1 fL (ref 80.0–100.0)
Monocytes Absolute: 0.6 10*3/uL (ref 0.1–1.0)
Monocytes Relative: 8 %
Neutro Abs: 2.3 10*3/uL (ref 1.7–7.7)
Neutrophils Relative %: 29 %
Platelet Count: 150 10*3/uL (ref 150–400)
RBC: 4.75 MIL/uL (ref 4.22–5.81)
RDW: 13.9 % (ref 11.5–15.5)
WBC Count: 7.8 10*3/uL (ref 4.0–10.5)
nRBC: 0 % (ref 0.0–0.2)

## 2021-11-09 MED ORDER — PNEUMOVAX 23 25 MCG/0.5ML IJ INJ
INJECTION | INTRAMUSCULAR | 0 refills | Status: DC
  Filled 2021-11-09: qty 0.5, 1d supply, fill #0

## 2021-11-09 NOTE — Progress Notes (Signed)
?  [place] ?OFFICE PROGRESS NOTE ? ? ?Diagnosis: CLL ? ?INTERVAL HISTORY:  ? ?Joshua Browning returns as scheduled.  He reports malaise.  No dysphagia.  He has gained weight.  Good appetite.  No fever or night sweats.  No change in palpable lymph nodes.  He is attending a movement disorder program. ? ?Objective: ? ?Vital signs in last 24 hours: ? ?Blood pressure (!) 150/72, pulse 61, temperature 97.8 ?F (36.6 ?C), temperature source Oral, resp. rate 20, height '5\' 10"'$  (1.778 m), weight 147 lb 9.6 oz (67 kg), SpO2 100 %. ?  ? ?Lymphatics: 1/2-1 cm bilateral low cervical/scalene and axillary nodes.  No inguinal nodes.  1/2 cm left occipital node, 1/2 cm left preauricular node ?Resp: Distant breath sounds, clear bilaterally, no respiratory distress ?Cardio: Regular rate and rhythm ?GI: No hepatosplenomegaly ?Vascular: No leg edema ? ?Lab Results: ? ?Lab Results  ?Component Value Date  ? WBC 7.8 11/09/2021  ? HGB 14.4 11/09/2021  ? HCT 44.2 11/09/2021  ? MCV 93.1 11/09/2021  ? PLT 150 11/09/2021  ? NEUTROABS 2.3 11/09/2021  ? ? ?CMP  ?Lab Results  ?Component Value Date  ? NA 142 05/13/2021  ? K 4.6 05/13/2021  ? CL 104 05/13/2021  ? CO2 31 05/13/2021  ? GLUCOSE 97 05/13/2021  ? BUN 14 05/13/2021  ? CREATININE 0.89 05/13/2021  ? CALCIUM 9.6 05/13/2021  ? PROT 6.9 05/13/2021  ? ALBUMIN 4.8 05/13/2021  ? AST 16 05/13/2021  ? ALT 5 05/13/2021  ? ALKPHOS 45 05/13/2021  ? BILITOT 0.5 05/13/2021  ? GFRNONAA >60 05/13/2021  ? ?Medications: I have reviewed the patient's current medications. ? ? ?Assessment/Plan: ?CLL ?Small bilateral cervical and axillary lymph nodes ?CT neck 04/29/2021-subcentimeter but abnormally numerous bilateral cervical lymph nodes.  Largest node 8 to 9 mm.  Small bilateral axillary lymph nodes.  Stable mediastinal lymph nodes appear relatively normal. 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 ? ? ?Disposition: ?Joshua Browning appears asymptomatic from the CLL.  He is stable from a hematologic standpoint.  There is no indication for treating the CLL at present.  He will return for an office visit and CBC in 6 months.  He will call in the interim for new symptoms.  He will receive a 23 valent pneumococcal vaccine today. ? ?Betsy Coder, MD ? ?11/09/2021  ?12:06 PM ? ? ?

## 2021-11-10 ENCOUNTER — Ambulatory Visit: Payer: Medicare Other | Admitting: Occupational Therapy

## 2021-11-11 ENCOUNTER — Ambulatory Visit: Payer: Medicare Other | Admitting: Occupational Therapy

## 2021-11-11 DIAGNOSIS — R278 Other lack of coordination: Secondary | ICD-10-CM

## 2021-11-11 DIAGNOSIS — R2681 Unsteadiness on feet: Secondary | ICD-10-CM | POA: Diagnosis not present

## 2021-11-11 DIAGNOSIS — M25621 Stiffness of right elbow, not elsewhere classified: Secondary | ICD-10-CM

## 2021-11-11 DIAGNOSIS — R29818 Other symptoms and signs involving the nervous system: Secondary | ICD-10-CM | POA: Diagnosis not present

## 2021-11-11 DIAGNOSIS — R29898 Other symptoms and signs involving the musculoskeletal system: Secondary | ICD-10-CM

## 2021-11-11 DIAGNOSIS — R293 Abnormal posture: Secondary | ICD-10-CM | POA: Diagnosis not present

## 2021-11-11 DIAGNOSIS — M25622 Stiffness of left elbow, not elsewhere classified: Secondary | ICD-10-CM

## 2021-11-11 DIAGNOSIS — R251 Tremor, unspecified: Secondary | ICD-10-CM | POA: Diagnosis not present

## 2021-11-11 DIAGNOSIS — M25611 Stiffness of right shoulder, not elsewhere classified: Secondary | ICD-10-CM | POA: Diagnosis not present

## 2021-11-11 NOTE — Therapy (Signed)
[facility] ?[place], [place], [address] ?Phone: [phone]   Fax:  [phone] ? ?Occupational Therapy Treatment ? ?Patient Details  ?Name: Joshua Browning ?MRN: [id number] ?Date of Birth: [birth year] ?Referring Provider (OT): Dr. Wells Guiles Tat ? ? ?Encounter Date: 11/11/2021 ? ? OT End of Session - 11/11/21 0759   ? ? Visit Number 7   ? Number of Visits 17   ? Date for OT Re-Evaluation 12/20/21   ? Authorization Type [employer]   ? Authorization - Visit Number 7   ? Authorization - Number of Visits 10   ? Progress Note Due on Visit 10   ? OT Start Time 0800   ? OT Stop Time 0845   ? OT Time Calculation (min) 45 min   ? Activity Tolerance Patient tolerated treatment well   ? Behavior During Therapy [facility] for tasks assessed/performed   ? ?  ?  ? ?  ? ? ?Past Medical History:  ?Diagnosis Date  ? BPH (benign prostatic hyperplasia)   ? Foley catheter in place   ? removed after turp  ? GERD (gastroesophageal reflux disease)   ? Nocturnal leg cramps   ? occ  ? Urinary retention   ? Wears glasses   ? ? ?Past Surgical History:  ?Procedure Laterality Date  ? CATARACT EXTRACTION W/ INTRAOCULAR LENS  IMPLANT, BILATERAL Bilateral   ? INGUINAL HERNIA REPAIR Bilateral RIGHT x 2 6948 umbilical done with last right  ? left last one done 10 yrs ago  ? RE-DO RIGHT INGUINAL HERNIA REPAIR AND UMBILICAL HERNIA REPAIR  2001  ? REMOVAL LEFT EYE INTRAOCULAR LENS AND REPLACEMENT   08-29-2013  ? TRANSURETHRAL RESECTION OF PROSTATE N/A 09/23/2013  ? Procedure: TRANSURETHRAL RESECTION OF THE PROSTATE (TURP) WITH GYRUS;  Surgeon: Claybon Jabs, MD;  Location: [facility];  Service: Urology;  Laterality: N/A;  ? VARICOCELECTOMY Left 07/02/2018  ? Procedure: VARICOCELECTOMY;  Surgeon: Kathie Rhodes, MD;  Location: [facility];  Service: Urology;  Laterality: Left;  ? ? ?There were no vitals filed for this visit. ? ? Subjective Assessment - 11/11/21  0759   ? ? Subjective  No changes. Sorry I missed my appt yesterday. I put in the wrong time on my phone   ? Pertinent History Parkinson's Disease.  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 Patient Stated Goals improve ADLs (dressing), handwriting   ? Currently in Pain? No/denies   ? ?  ?  ? ?  ? ? ?Tossing scarf in air (standing with back against counter) and catching w/ opposite hand w/ cues to open hands fully to catch, to widen BOS, and stand up tall. Once pt felt comfortable w/ physical task, added cognitive task for dual tasking - pt would often have to stop physical task to perform category generation. ?Standing facing counter - cross reaching/trunk rotation to pick up scarves and then toss to ipsilateral side while performing category generation for dual tasking - pt required cues to open hand fully to grasp scarf, widen BOS, and toss big.  ? ?Worked on [employer]! Up exercise standing w/ back against wall w/ focus on trunk extension, scapula retraction. Followed by [facility]! Rock in modified quadraped over table for trunk extension and pelvic mobility. Stressed importance of PWR! Up exercises for posture and verbally reviewed PWR! Up in supine as well to prevent further flexed posture  and rounded shoulders.  ? ?Reviewed strategies for writing to maintain size and legibility and for donning/doffing jacket.  ? ? ? ? ? ? ? ? ? ? ? ? ? ? ? ? ? ? ? ? ? ? OT Short Term Goals - 11/11/21 0858   ? ?  ? OT SHORT TERM GOAL #1  ? Title Pt will be independent with updated PD-specific HEP.--check STGs 11/20/21   ? Time 4   ? Period Weeks   ? Status On-going   ?  ? OT SHORT TERM GOAL #2  ? Title Pt will demo at least 120* R shoulder flex with at least -15* elbow ext for overhead reach.   ? Baseline 110* with -20* elbow flex   ? Time 4   ? Period Weeks   ? Status On-going   ?  ? OT SHORT TERM GOAL #3  ? Title Pt will demo at least  120* L shoulder flex with at least -20* elbow ext for overhead reach.   ? Baseline 120* with -30* elbow flex   ? Time 4   ? Period Weeks   ? Status On-going   ?  ? OT SHORT TERM GOAL #4  ? Title --   ?  ? OT SHORT TERM GOAL #5  ? Title --   ? ?  ?  ? ?  ? ? ? ? OT Long Term Goals - 10/21/21 1426   ? ?  ? OT LONG TERM GOAL #1  ? Title Pt will verbalize understanding of adaptive strategies for ADLs/IADLs in order to incr ease, incr safety, and decr risk of future complications (including eating, dressing, picking up items from floor, vacuuming, writing).   ? Time 8   ? Period Weeks   ? Status New   ?  ? OT LONG TERM GOAL #2  ? Title Pt will write a short paragraph with 100% legibility and no significant decrease in letter size   ? Time 8   ? Period Weeks   ? Status New   ?  ? OT LONG TERM GOAL #3  ? Title Pt will demonstrate increased ease with dressing as eveidnced by decreasing PPT#4 (don/ doff jacket) to 18 secs or less   ? Baseline 20.85sec   ? Time 8   ? Period Weeks   ? Status New   ?  ? OT LONG TERM GOAL #4  ? Title Pt will demonstrate improved ease with dressing as eveidenced by fastening/unfastening 3 buttons in 28 secs or less   ? Baseline 31sec   ? Time 8   ? Period Weeks   ? Status New   ?  ? OT LONG TERM GOAL #5  ? Title Pt will demo incr ease with eating as shown by performing PPT#2 in 10sec or less.   ? Baseline 12.03sec   ? Time 8   ? Period Weeks   ? Status New   ? ?  ?  ? ?  ? ? ? ? ? ? ? ? Plan - 11/11/21 0857   ? ? Clinical Impression Statement Pt is progressing toward goals. Pt responds well to cueing for posture   ? OT Occupational Profile and History Detailed Assessment- Review of Records and additional review of physical, cognitive, psychosocial history related to current functional performance   ? Occupational performance deficits (Please refer to evaluation for details): ADL's;IADL's;Leisure   ? Body Structure / Function / Physical Skills ADL;Tone;Dexterity;Balance;UE functional use;Body  mechanics;IADL;ROM;Endurance;Improper  spinal/pelvic alignment;Coordination;Flexibility;Mobility;FMC   ? Cognitive Skills Memory   ? Rehab Potential Good   ? Clinical Decision Making Several treatment options, min-mod task modification necessary   ? Comorbidities Affecting Occupational Performance: May have comorbidities impacting occupational performance   ? Modification or Assistance to Complete Evaluation  Min-Moderate modification of tasks or assist with assess necessary to complete eval   ? OT Frequency 2x / week   ? OT Duration 8 weeks   +eval (for scheduling purposes, but anticipate d/c after 6 weeks)  ? OT Treatment/Interventions Self-care/ADL training;Moist Heat;DME and/or AE instruction;Splinting;Balance training;Therapeutic activities;Therapeutic exercise;Cognitive remediation/compensation;Passive range of motion;Functional Mobility Training;Neuromuscular education;Cryotherapy;Energy conservation;Manual Therapy;Patient/family education   ? Plan continue with large amplitude movement strategies for ADLs, functional reaching overhead, continue to reinforce posture and PWR! Up moves   ? Consulted and Agree with Plan of Care Patient   ? ?  ?  ? ?  ? ? ?Patient will benefit from skilled therapeutic intervention in order to improve the following deficits and impairments:   ?Body Structure / Function / Physical Skills: ADL, Tone, Dexterity, Balance, UE functional use, Body mechanics, IADL, ROM, Endurance, Improper spinal/pelvic alignment, Coordination, Flexibility, Mobility, FMC ?Cognitive Skills: Memory ?  ? ? ?Visit Diagnosis: ?Other symptoms and signs involving the nervous system ? ?Other lack of coordination ? ?Stiffness of right elbow, not elsewhere classified ? ?Stiffness of left elbow, not elsewhere classified ? ?Other symptoms and signs involving the musculoskeletal system ? ?Stiffness of right shoulder, not elsewhere classified ? ?Unsteadiness on feet ? ? ? ?Problem List ?Patient Active Problem List  ?  Diagnosis Date Noted  ? BPH (benign prostatic hypertrophy) with urinary retention 09/23/2013  ? ? ?Carey Bullocks, OTR/L ?11/11/2021, 8:59 AM ? ?[place] [facility]

## 2021-11-16 ENCOUNTER — Ambulatory Visit: Payer: Medicare Other | Attending: Family Medicine | Admitting: Occupational Therapy

## 2021-11-16 DIAGNOSIS — M25622 Stiffness of left elbow, not elsewhere classified: Secondary | ICD-10-CM | POA: Diagnosis not present

## 2021-11-16 DIAGNOSIS — R278 Other lack of coordination: Secondary | ICD-10-CM | POA: Insufficient documentation

## 2021-11-16 DIAGNOSIS — R251 Tremor, unspecified: Secondary | ICD-10-CM | POA: Insufficient documentation

## 2021-11-16 DIAGNOSIS — R2681 Unsteadiness on feet: Secondary | ICD-10-CM | POA: Diagnosis not present

## 2021-11-16 DIAGNOSIS — M25621 Stiffness of right elbow, not elsewhere classified: Secondary | ICD-10-CM | POA: Diagnosis not present

## 2021-11-16 DIAGNOSIS — R29898 Other symptoms and signs involving the musculoskeletal system: Secondary | ICD-10-CM | POA: Diagnosis not present

## 2021-11-16 DIAGNOSIS — R29818 Other symptoms and signs involving the nervous system: Secondary | ICD-10-CM | POA: Diagnosis not present

## 2021-11-16 DIAGNOSIS — M25611 Stiffness of right shoulder, not elsewhere classified: Secondary | ICD-10-CM | POA: Diagnosis not present

## 2021-11-16 DIAGNOSIS — R293 Abnormal posture: Secondary | ICD-10-CM | POA: Insufficient documentation

## 2021-11-16 NOTE — Therapy (Signed)
[facility] ?[place] ?[place], [place], [address] ?Phone: [phone]   Fax:  [phone] ? ?Occupational Therapy Treatment ? ?Patient Details  ?Name: Joshua Browning ?MRN: [id number] ?Date of Birth: 05/27/1946 ?Referring Provider (OT): Dr. Wells Guiles Tat ? ? ?Encounter Date: 11/16/2021 ? ? OT End of Session - 11/16/21 1408   ? ? Visit Number 8   ? Number of Visits 17   ? Date for OT Re-Evaluation 12/20/21   ? Authorization - Visit Number 8   ? Authorization - Number of Visits 10   ? Progress Note Due on Visit 10   ? OT Start Time 1405   ? OT Stop Time 6962   ? OT Time Calculation (min) 40 min   ? ?  ?  ? ?  ? ? ?Past Medical History:  ?Diagnosis Date  ? BPH (benign prostatic hyperplasia)   ? Foley catheter in place   ? removed after turp  ? GERD (gastroesophageal reflux disease)   ? Nocturnal leg cramps   ? occ  ? Urinary retention   ? Wears glasses   ? ? ?Past Surgical History:  ?Procedure Laterality Date  ? CATARACT EXTRACTION W/ INTRAOCULAR LENS  IMPLANT, BILATERAL Bilateral   ? INGUINAL HERNIA REPAIR Bilateral RIGHT x 2 9528 umbilical done with last right  ? left last one done 10 yrs ago  ? RE-DO RIGHT INGUINAL HERNIA REPAIR AND UMBILICAL HERNIA REPAIR  2001  ? REMOVAL LEFT EYE INTRAOCULAR LENS AND REPLACEMENT   08-29-2013  ? TRANSURETHRAL RESECTION OF PROSTATE N/A 09/23/2013  ? Procedure: TRANSURETHRAL RESECTION OF THE PROSTATE (TURP) WITH GYRUS;  Surgeon: Claybon Jabs, MD;  Location: [facility];  Service: Urology;  Laterality: N/A;  ? VARICOCELECTOMY Left 07/02/2018  ? Procedure: VARICOCELECTOMY;  Surgeon: Kathie Rhodes, MD;  Location: [facility];  Service: Urology;  Laterality: Left;  ? ? ?There were no vitals filed for this visit. ? ? Subjective Assessment - 11/16/21 1408   ? ? Subjective  Denies pain   ? Pertinent History Parkinson's Disease.  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 Patient Stated Goals improve ADLs (dressing), handwriting   ? Currently in Pain? No/denies   ? ?  ?  ? ?  ? ? ? ? ? ? ? ? ? ? ? ? ? ? ? ? ? ? ? ? ? ? ? OT Treatment/ Education - 11/16/21 1430   ? ? Education Details PWR! moves basic 4 in modified quadraped, min v.c initally then pt returned demonstration, ADL strategies for donning, doffing jacket, eating, and fastening buttons, min v.c for techniques, Therapist started checking progress towards goals.   ? Person(s) Educated Patient   ? Methods Explanation;Demonstration;Verbal cues;Handout   ? Comprehension Verbalized understanding;Returned demonstration   ? ?  ?  ? ?  ? ? ? OT Short Term Goals - 11/16/21 1429   ? ?  ? OT SHORT TERM GOAL #1  ? Title Pt will be independent with updated PD-specific HEP.--check STGs 11/20/21   ? Time 4   ? Period Weeks   ? Status On-going   ?  ? OT SHORT TERM GOAL #2  ? Title Pt will demo at least 120* R shoulder flex with at least -15* elbow ext for overhead reach.   ? Baseline 110* with -20* elbow flex   ? Time 4   ?  Period Weeks   ? Status On-going   ?  ? OT SHORT TERM GOAL #3  ? Title Pt will demo at least 120* L shoulder flex with at least -20* elbow ext for overhead reach.   ? Baseline 120* with -30* elbow flex   ? Time 4   ? Period Weeks   ? Status On-going   ?  ? OT SHORT TERM GOAL #4  ? Title --   ?  ? OT SHORT TERM GOAL #5  ? Title --   ? ?  ?  ? ?  ? ? ? ? OT Long Term Goals - 11/16/21 1436   ? ?  ? OT LONG TERM GOAL #1  ? Title Pt will verbalize understanding of adaptive strategies for ADLs/IADLs in order to incr ease, incr safety, and decr risk of future complications (including eating, dressing, picking up items from floor, vacuuming, writing).   ? Time 8   ? Period Weeks   ? Status On-going   ?  ? OT LONG TERM GOAL #2  ? Title Pt will write a short paragraph with 100% legibility and no significant decrease in letter size   ? Time 8   ? Period Weeks    ? Status On-going   ?  ? OT LONG TERM GOAL #3  ? Title Pt will demonstrate increased ease with dressing as eveidnced by decreasing PPT#4 (don/ doff jacket) to 18 secs or less   ? Baseline 20.85sec   ? Time 8   ? Period Weeks   ? Status On-going   11.19 secs  ?  ? OT LONG TERM GOAL #4  ? Title Pt will demonstrate improved ease with dressing as eveidenced by fastening/unfastening 3 buttons in 28 secs or less   ? Baseline 31sec   ? Time 8   ? Period Weeks   ? Status On-going   28.07 secs  ?  ? OT LONG TERM GOAL #5  ? Title Pt will demo incr ease with eating as shown by performing PPT#2 in 10sec or less.   ? Baseline 12.03sec   ? Time 8   ? Period Weeks   ? Status On-going   11.35 secs  ? ?  ?  ? ?  ? ? ? ? ? ? ? ? Plan - 11/16/21 1409   ? ? Clinical Impression Statement Pt is progressing toward goals. Pt responds well to cueing for posture   ? OT Occupational Profile and History Detailed Assessment- Review of Records and additional review of physical, cognitive, psychosocial history related to current functional performance   ? Occupational performance deficits (Please refer to evaluation for details): ADL's;IADL's;Leisure   ? Body Structure / Function / Physical Skills ADL;Tone;Dexterity;Balance;UE functional use;Body mechanics;IADL;ROM;Endurance;Improper spinal/pelvic alignment;Coordination;Flexibility;Mobility;FMC   ? Cognitive Skills Memory   ? Rehab Potential Good   ? Clinical Decision Making Several treatment options, min-mod task modification necessary   ? Comorbidities Affecting Occupational Performance: May have comorbidities impacting occupational performance   ? Modification or Assistance to Complete Evaluation  Min-Moderate modification of tasks or assist with assess necessary to complete eval   ? OT Frequency 2x / week   ? OT Duration 8 weeks   +eval (for scheduling purposes, but anticipate d/c after 6 weeks)  ? OT Treatment/Interventions Self-care/ADL training;Moist Heat;DME and/or AE  instruction;Splinting;Balance training;Therapeutic activities;Therapeutic exercise;Cognitive remediation/compensation;Passive range of motion;Functional Mobility Training;Neuromuscular education;Cryotherapy;Energy conservation;Manual Therapy;Patient/family education   ? Plan continue with large amplitude movement strategies for ADLs, functional reaching  overhead, continue to reinforce posture and PWR! Up moves   ? Consulted and Agree with Plan of Care Patient   ? ?  ?  ? ?  ? ? ?Patient will benefit from skilled therapeutic intervention in order to improve the following deficits and impairments:   ?Body Structure / Function / Physical Skills: ADL, Tone, Dexterity, Balance, UE functional use, Body mechanics, IADL, ROM, Endurance, Improper spinal/pelvic alignment, Coordination, Flexibility, Mobility, FMC ?Cognitive Skills: Memory ?  ? ? ?Visit Diagnosis: ?Other symptoms and signs involving the nervous system ? ?Other lack of coordination ? ?Stiffness of right elbow, not elsewhere classified ? ?Stiffness of left elbow, not elsewhere classified ? ?Other symptoms and signs involving the musculoskeletal system ? ?Stiffness of right shoulder, not elsewhere classified ? ?Unsteadiness on feet ? ?Abnormal posture ? ?Tremor ? ? ? ?Problem List ?Patient Active Problem List  ? Diagnosis Date Noted  ? BPH (benign prostatic hypertrophy) with urinary retention 09/23/2013  ? ? ?Staley Lunz, OT ?11/16/2021, 4:20 PM ? ?[facility] ?[place] ?[place], [place], [address] ?Phone: [phone]   Fax:  [phone] ? ?Name: Joshua Browning ?MRN: [id number] ?Date of Birth: 1945-10-02 ? ?

## 2021-11-18 ENCOUNTER — Ambulatory Visit: Payer: Medicare Other | Admitting: Occupational Therapy

## 2021-11-18 ENCOUNTER — Encounter: Payer: Self-pay | Admitting: Occupational Therapy

## 2021-11-18 DIAGNOSIS — M25621 Stiffness of right elbow, not elsewhere classified: Secondary | ICD-10-CM

## 2021-11-18 DIAGNOSIS — M25611 Stiffness of right shoulder, not elsewhere classified: Secondary | ICD-10-CM | POA: Diagnosis not present

## 2021-11-18 DIAGNOSIS — R293 Abnormal posture: Secondary | ICD-10-CM | POA: Diagnosis not present

## 2021-11-18 DIAGNOSIS — R278 Other lack of coordination: Secondary | ICD-10-CM

## 2021-11-18 DIAGNOSIS — M25622 Stiffness of left elbow, not elsewhere classified: Secondary | ICD-10-CM

## 2021-11-18 DIAGNOSIS — R2681 Unsteadiness on feet: Secondary | ICD-10-CM | POA: Diagnosis not present

## 2021-11-18 DIAGNOSIS — R29898 Other symptoms and signs involving the musculoskeletal system: Secondary | ICD-10-CM | POA: Diagnosis not present

## 2021-11-18 DIAGNOSIS — R29818 Other symptoms and signs involving the nervous system: Secondary | ICD-10-CM | POA: Diagnosis not present

## 2021-11-18 DIAGNOSIS — R251 Tremor, unspecified: Secondary | ICD-10-CM | POA: Diagnosis not present

## 2021-11-18 NOTE — Therapy (Signed)
[facility] ?[place], [place], [address] ?Phone: [phone]   Fax:  [phone] ? ?Occupational Therapy Treatment ? ?Patient Details  ?Name: Joshua Browning ?MRN: [id number] ?Date of Birth: 1945/12/24 ?Referring Provider (OT): Dr. Wells Guiles Tat ? ? ?Encounter Date: 11/18/2021 ? ? OT End of Session - 11/18/21 0942   ? ? Visit Number 9   ? Number of Visits 17   ? Date for OT Re-Evaluation 12/20/21   ? Authorization Type [employer] Medicare   ? Authorization - Visit Number 9   ? Authorization - Number of Visits 10   ? Progress Note Due on Visit 10   ? OT Start Time [phone]   ? OT Stop Time 1015   ? OT Time Calculation (min) 50 min   ? Activity Tolerance Patient tolerated treatment well   ? Behavior During Therapy [facility] for tasks assessed/performed   ? ?  ?  ? ?  ? ? ?Past Medical History:  ?Diagnosis Date  ? BPH (benign prostatic hyperplasia)   ? Foley catheter in place   ? removed after turp  ? GERD (gastroesophageal reflux disease)   ? Nocturnal leg cramps   ? occ  ? Urinary retention   ? Wears glasses   ? ? ?Past Surgical History:  ?Procedure Laterality Date  ? CATARACT EXTRACTION W/ INTRAOCULAR LENS  IMPLANT, BILATERAL Bilateral   ? INGUINAL HERNIA REPAIR Bilateral RIGHT x 2 9357 umbilical done with last right  ? left last one done 10 yrs ago  ? RE-DO RIGHT INGUINAL HERNIA REPAIR AND UMBILICAL HERNIA REPAIR  2001  ? REMOVAL LEFT EYE INTRAOCULAR LENS AND REPLACEMENT   08-29-2013  ? TRANSURETHRAL RESECTION OF PROSTATE N/A 09/23/2013  ? Procedure: TRANSURETHRAL RESECTION OF THE PROSTATE (TURP) WITH GYRUS;  Surgeon: Claybon Jabs, MD;  Location: [facility];  Service: Urology;  Laterality: N/A;  ? VARICOCELECTOMY Left 07/02/2018  ? Procedure: VARICOCELECTOMY;  Surgeon: Kathie Rhodes, MD;  Location: [facility];  Service: Urology;  Laterality: Left;  ? ? ?There were no vitals filed for this visit. ? ? Subjective Assessment - 11/18/21  0943   ? ? Subjective  I don't have any problems with the PWR! moves in class. The only ones difficult were on my hands and knees and the review w/ Curt Bears last time helped   ? Pertinent History Parkinson's Disease.  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 Patient Stated Goals improve ADLs (dressing), handwriting   ? Currently in Pain? No/denies   ? ?  ?  ? ?  ? ? ?Checked progress towards goals - see below goal section for times on measured/timed tests.  ?Practiced writing 3 paragraphs w/ 90% legibility and slightly decrease letter size.  ?Practiced cursive l's, a/r's across page.  ? ?Reviewed strategies for vacuuming and picking up items off floor - may need further review. Reinforced need for wider and staggered stance ? ?Standing in corner: working on wt shifts and trunk rotation w/ diagonal patterns and contralateral reaching ?PWR! Up against wall.  ? ?UBE x 5 min, maintaining rpm > 40 ? ? ? ? ? ? ? ? ? ? ? ? ? ? ? ? ? ? ? ? OT Short Term Goals - 11/18/21 0946   ? ?  ? OT SHORT TERM GOAL #1  ? Title Pt will be independent with updated PD-specific HEP.--check STGs  11/20/21   ? Time 4   ? Period Weeks   ? Status Achieved   ?  ? OT SHORT TERM GOAL #2  ? Title Pt will demo at least 120* R shoulder flex with at least -15* elbow ext for overhead reach.   ? Baseline 110* with -20* elbow flex   ? Time 4   ? Period Weeks   ? Status On-going   ?  ? OT SHORT TERM GOAL #3  ? Title Pt will demo at least 120* L shoulder flex with at least -20* elbow ext for overhead reach.   ? Baseline 120* with -30* elbow flex   ? Time 4   ? Period Weeks   ? Status On-going   ?  ? OT SHORT TERM GOAL #4  ? Title --   ?  ? OT SHORT TERM GOAL #5  ? Title --   ? ?  ?  ? ?  ? ? ? ? OT Long Term Goals - 11/18/21 0946   ? ?  ? OT LONG TERM GOAL #1  ? Title Pt will verbalize understanding of adaptive strategies for ADLs/IADLs in order to incr  ease, incr safety, and decr risk of future complications (including eating, dressing, picking up items from floor, vacuuming, writing).   ? Time 8   ? Period Weeks   ? Status On-going   reviewed on 11/18/21 - may benefit from further review  ?  ? OT LONG TERM GOAL #2  ? Title Pt will write a short paragraph with 100% legibility and no significant decrease in letter size   ? Time 8   ? Period Weeks   ? Status On-going   11/18/21: 90%  ?  ? OT LONG TERM GOAL #3  ? Title Pt will demonstrate increased ease with dressing as eveidnced by decreasing PPT#4 (don/ doff jacket) to 18 secs or less   ? Baseline 20.85sec   ? Time 8   ? Period Weeks   ? Status Achieved   11.19 secs, 15 sec  ?  ? OT LONG TERM GOAL #4  ? Title Pt will demonstrate improved ease with dressing as eveidenced by fastening/unfastening 3 buttons in 28 secs or less   ? Baseline 31sec   ? Time 8   ? Period Weeks   ? Status Achieved   28.07 secs, 11/18/21: 28.78 sec  ?  ? OT LONG TERM GOAL #5  ? Title Pt will demo incr ease with eating as shown by performing PPT#2 in 10sec or less.   ? Baseline 12.03sec   ? Time 8   ? Period Weeks   ? Status On-going   11.35 secs, 11/18/21: 10.25 sec  ? ?  ?  ? ?  ? ? ? ? ? ? ? ? Plan - 11/18/21 1014   ? ? Clinical Impression Statement Pt is progressing toward goals. Pt responds well to cueing for posture. Pt requires cueing for wider stance   ? OT Occupational Profile and History Detailed Assessment- Review of Records and additional review of physical, cognitive, psychosocial history related to current functional performance   ? Occupational performance deficits (Please refer to evaluation for details): ADL's;IADL's;Leisure   ? Body Structure / Function / Physical Skills ADL;Tone;Dexterity;Balance;UE functional use;Body mechanics;IADL;ROM;Endurance;Improper spinal/pelvic alignment;Coordination;Flexibility;Mobility;FMC   ? Cognitive Skills Memory   ? Rehab Potential Good   ? Clinical Decision Making Several treatment options, min-mod  task modification necessary   ? Comorbidities Affecting Occupational Performance: May  have comorbidities impacting occupational performance   ? Modification or Assistance to Complete Evaluation  Min-Moderate modification of tasks or assist with assess necessary to complete eval   ? OT Frequency 2x / week   ? OT Duration 8 weeks   +eval (for scheduling purposes, but anticipate d/c after 6 weeks)  ? OT Treatment/Interventions Self-care/ADL training;Moist Heat;DME and/or AE instruction;Splinting;Balance training;Therapeutic activities;Therapeutic exercise;Cognitive remediation/compensation;Passive range of motion;Functional Mobility Training;Neuromuscular education;Cryotherapy;Energy conservation;Manual Therapy;Patient/family education   ? Plan 10th progress note, check remaining STG's and LTG's in prep for d/c next week  ? Consulted and Agree with Plan of Care Patient   ? ?  ?  ? ?  ? ? ?Patient will benefit from skilled therapeutic intervention in order to improve the following deficits and impairments:   ?Body Structure / Function / Physical Skills: ADL, Tone, Dexterity, Balance, UE functional use, Body mechanics, IADL, ROM, Endurance, Improper spinal/pelvic alignment, Coordination, Flexibility, Mobility, FMC ?Cognitive Skills: Memory ?  ? ? ?Visit Diagnosis: ?Other symptoms and signs involving the nervous system ? ?Other lack of coordination ? ?Stiffness of right elbow, not elsewhere classified ? ?Stiffness of left elbow, not elsewhere classified ? ?Other symptoms and signs involving the musculoskeletal system ? ?Stiffness of right shoulder, not elsewhere classified ? ?Unsteadiness on feet ? ? ? ?Problem List ?Patient Active Problem List  ? Diagnosis Date Noted  ? BPH (benign prostatic hypertrophy) with urinary retention 09/23/2013  ? ? ?Hans Eden, OTR/L ?11/18/2021, 10:17 AM ? ?[facility] ?[place], [place], [address] ?Phone: [phone]    Fax:  [phone] ? ?Name: MAHAMED ZALEWSKI ?MRN: [id number] ?Date of Birth: 1946/01/18 ? ?

## 2021-11-23 ENCOUNTER — Ambulatory Visit: Payer: Medicare Other | Admitting: Occupational Therapy

## 2021-11-23 ENCOUNTER — Encounter: Payer: Self-pay | Admitting: Occupational Therapy

## 2021-11-23 DIAGNOSIS — R2681 Unsteadiness on feet: Secondary | ICD-10-CM | POA: Diagnosis not present

## 2021-11-23 DIAGNOSIS — R278 Other lack of coordination: Secondary | ICD-10-CM | POA: Diagnosis not present

## 2021-11-23 DIAGNOSIS — R293 Abnormal posture: Secondary | ICD-10-CM | POA: Diagnosis not present

## 2021-11-23 DIAGNOSIS — R29898 Other symptoms and signs involving the musculoskeletal system: Secondary | ICD-10-CM | POA: Diagnosis not present

## 2021-11-23 DIAGNOSIS — M25621 Stiffness of right elbow, not elsewhere classified: Secondary | ICD-10-CM | POA: Diagnosis not present

## 2021-11-23 DIAGNOSIS — M25611 Stiffness of right shoulder, not elsewhere classified: Secondary | ICD-10-CM | POA: Diagnosis not present

## 2021-11-23 DIAGNOSIS — R251 Tremor, unspecified: Secondary | ICD-10-CM

## 2021-11-23 DIAGNOSIS — R29818 Other symptoms and signs involving the nervous system: Secondary | ICD-10-CM | POA: Diagnosis not present

## 2021-11-23 DIAGNOSIS — M25622 Stiffness of left elbow, not elsewhere classified: Secondary | ICD-10-CM | POA: Diagnosis not present

## 2021-11-23 NOTE — Therapy (Signed)
?[facility] ?[place], [place], [address] ?Phone: [phone]   Fax:  [phone] ? ?Occupational Therapy Treatment ? ?Patient Details  ?Name: FURQAN GOSSELIN ?MRN: [id number] ?Date of Birth: 03/07/1946 ?Referring Provider (OT): Dr. Wells Guiles Tat ? ? ?Encounter Date: 11/23/2021 ? ? OT End of Session - 11/23/21 1110   ? ? Visit Number 10   ? Number of Visits 17   ? Date for OT Re-Evaluation 12/20/21   ? Authorization Type [employer]HC Medicare   ? Authorization - Visit Number 10   ? Authorization - Number of Visits 10   ? Progress Note Due on Visit 10   ? OT Start Time 1108   ? OT Stop Time 1146   ? OT Time Calculation (min) 38 min   ? Activity Tolerance Patient tolerated treatment well   ? Behavior During Therapy [facility] for tasks assessed/performed   ? ?  ?  ? ?  ? ? ?Past Medical History:  ?Diagnosis Date  ? BPH (benign prostatic hyperplasia)   ? Foley catheter in place   ? removed after turp  ? GERD (gastroesophageal reflux disease)   ? Nocturnal leg cramps   ? occ  ? Urinary retention   ? Wears glasses   ? ? ?Past Surgical History:  ?Procedure Laterality Date  ? CATARACT EXTRACTION W/ INTRAOCULAR LENS  IMPLANT, BILATERAL Bilateral   ? INGUINAL HERNIA REPAIR Bilateral RIGHT x 2 4268 umbilical done with last right  ? left last one done 10 yrs ago  ? RE-DO RIGHT INGUINAL HERNIA REPAIR AND UMBILICAL HERNIA REPAIR  2001  ? REMOVAL LEFT EYE INTRAOCULAR LENS AND REPLACEMENT   08-29-2013  ? TRANSURETHRAL RESECTION OF PROSTATE N/A 09/23/2013  ? Procedure: TRANSURETHRAL RESECTION OF THE PROSTATE (TURP) WITH GYRUS;  Surgeon: Claybon Jabs, MD;  Location: [facility];  Service: Urology;  Laterality: N/A;  ? VARICOCELECTOMY Left 07/02/2018  ? Procedure: VARICOCELECTOMY;  Surgeon: Kathie Rhodes, MD;  Location: [facility];  Service: Urology;  Laterality: Left;  ? ? ?There were no vitals filed for this visit. ? ? Subjective Assessment -  11/23/21 1110   ? ? Subjective  I think that I'm doing better and that this has been helpful   ? Pertinent History Parkinson's Disease.  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 Patient Stated Goals improve ADLs (dressing), handwriting   ? Currently in Pain? No/denies   ? ?  ?  ? ?  ? ? ?Practiced writing:  Copied 2 sentences then wrote 2 sentences that were self generated.  Pt printed with good legibility and no significant decr in size.  Practiced signature with good legibility, very min decr in size x2 letters (x4 attempts).   ? ?Continued checking goals--see goal section below.  Reviewed use of large amplitude movement strategies for ADLs and pt able to verbalize.   ? ?Standing, functional reaching to pick up items from floor (reviewed use of strategies) and pt returned demo. ? ?Standing, functional reaching with trunk rotation and lateral wt. Shifts to perform lateral and cross body reaching with each UE with min cueing and set-up for large amplitude movements. ? ?Dealing cards with each thumb and picking up and stacking coins with PWR! Hands with each UE with min difficulty. ? ? ? ? OT Short Term Goals - 11/23/21 1116   ? ?  ? OT SHORT TERM  GOAL #1  ? Title Pt will be independent with updated PD-specific HEP.--check STGs 11/20/21   ? Time 4   ? Period Weeks   ? Status Achieved   ?  ? OT SHORT TERM GOAL #2  ? Title Pt will demo at least 120* R shoulder flex with at least -15* elbow ext for overhead reach.   ? Baseline 110* with -20* elbow flex   ? Time 4   ? Period Weeks   ? Status Partially Met   11/23/21:  120* with -25* elbow ext  ?  ? OT SHORT TERM GOAL #3  ? Title Pt will demo at least 120* L shoulder flex with at least -20* elbow ext for overhead reach.   ? Baseline 120* with -30* elbow flex   ? Time 4   ? Period Weeks   ? Status Partially Met   11/23/21:  130* with -30*  ?  ? OT SHORT TERM GOAL #4  ?  Title --   ?  ? OT SHORT TERM GOAL #5  ? Title --   ? ?  ?  ? ?  ? ? ? ? OT Long Term Goals - 11/18/21 0946   ? ?  ? OT LONG TERM GOAL #1  ? Title Pt will verbalize understanding of adaptive strategies for ADLs/IADLs in order to incr ease, incr safety, and decr risk of future complications (including eating, dressing, picking up items from floor, vacuuming, writing).   ? Time 8   ? Period Weeks   ? Status On-going   reviewed on 11/18/21 - may benefit from further review  ?  ? OT LONG TERM GOAL #2  ? Title Pt will write a short paragraph with 100% legibility and no significant decrease in letter size   ? Time 8   ? Period Weeks   ? Status On-going   11/18/21: 90%  ?  ? OT LONG TERM GOAL #3  ? Title Pt will demonstrate increased ease with dressing as eveidnced by decreasing PPT#4 (don/ doff jacket) to 18 secs or less   ? Baseline 20.85sec   ? Time 8   ? Period Weeks   ? Status Achieved   11.19 secs, 15 sec  ?  ? OT LONG TERM GOAL #4  ? Title Pt will demonstrate improved ease with dressing as eveidenced by fastening/unfastening 3 buttons in 28 secs or less   ? Baseline 31sec   ? Time 8   ? Period Weeks   ? Status Achieved   28.07 secs, 11/18/21: 28.78 sec  ?  ? OT LONG TERM GOAL #5  ? Title Pt will demo incr ease with eating as shown by performing PPT#2 in 10sec or less.   ? Baseline 12.03sec   ? Time 8   ? Period Weeks   ? Status On-going   11.35 secs, 11/18/21: 10.25 sec  ? ?  ?  ? ?  ? ? ? ? ? ? ? ? Plan - 11/23/21 1111   ? ? Clinical Impression Statement Pt is progressing towards goals and demo incr ease with ADLs and shoulder ROM.  Pt would benefit from additional visit to complete/review pt education.   ? OT Occupational Profile and History Detailed Assessment- Review of Records and additional review of physical, cognitive, psychosocial history related to current functional performance   ? Occupational performance deficits (Please refer to evaluation for details): ADL's;IADL's;Leisure   ? Body Structure / Function /  Physical Skills ADL;Tone;Dexterity;Balance;UE functional  use;Body mechanics;IADL;ROM;Endurance;Improper spinal/pelvic alignment;Coordination;Flexibility;Mobility;FMC   ? Cognitive Skills Memory   ? Rehab Potential Good   ? Clinical Decision Making Several treatment options, min-mod task modification necessary   ? Comorbidities Affecting Occupational Performance: May have comorbidities impacting occupational performance   ? Modification or Assistance to Complete Evaluation  Min-Moderate modification of tasks or assist with assess necessary to complete eval   ? OT Frequency 2x / week   ? OT Duration 8 weeks   +eval (for scheduling purposes, but anticipate d/c after 6 weeks)  ? OT Treatment/Interventions Self-care/ADL training;Moist Heat;DME and/or AE instruction;Splinting;Balance training;Therapeutic activities;Therapeutic exercise;Cognitive remediation/compensation;Passive range of motion;Functional Mobility Training;Neuromuscular education;Cryotherapy;Energy conservation;Manual Therapy;Patient/family education   ? Plan check remaining STG's and LTG's and review ADL strategies prn; schedule PD screens in approx 6 months.   ? Consulted and Agree with Plan of Care Patient   ? ?  ?  ? ?  ? ? ?Patient will benefit from skilled therapeutic intervention in order to improve the following deficits and impairments:   ?Body Structure / Function / Physical Skills: ADL, Tone, Dexterity, Balance, UE functional use, Body mechanics, IADL, ROM, Endurance, Improper spinal/pelvic alignment, Coordination, Flexibility, Mobility, FMC ?Cognitive Skills: Memory ?  ? ? ?Visit Diagnosis: ?Other symptoms and signs involving the nervous system ? ?Other lack of coordination ? ?Stiffness of right elbow, not elsewhere classified ? ?Stiffness of left elbow, not elsewhere classified ? ?Other symptoms and signs involving the musculoskeletal system ? ?Stiffness of right shoulder, not elsewhere classified ? ?Unsteadiness on feet ? ?Abnormal  posture ? ?Tremor ? ? ? ?Problem List ?Patient Active Problem List  ? Diagnosis Date Noted  ? BPH (benign prostatic hypertrophy) with urinary retention 09/23/2013  ? ? Vianne Bulls, [place] ?11/23/2021, 11:24 AM ? ?[place]

## 2021-11-24 IMAGING — CT CT NECK W/ CM
5 of 6 series · 14 of 35 positions shown, 16 images · IV contrast (iopamidol)
Comparison: Cervical spine MRI 05/13/2017. Brain MRI 12/08/2015.

CLINICAL DATA: 75-year-old male with unintentional weight loss.
Left side cervical lymphadenopathy x6 months.

Creatinine was obtained on site at [HOSPITAL] at [REDACTED].
Results: Creatinine 0.9 mg/dL.
EXAM:
CT NECK WITH CONTRAST
TECHNIQUE: Multidetector CT imaging of the neck was performed using the
standard protocol following the bolus administration of intravenous
contrast.
CONTRAST:  60mL [id number] IOPAMIDOL ([id number]) INJECTION 76%

[Series 2: neck 2.00 br40 s3 st/ no angle · axial · 0.51mm/px · z∈[-712,-618]mm · 2 of 138 slices shown, 3 images]
[im 46/138  soft-tissue]
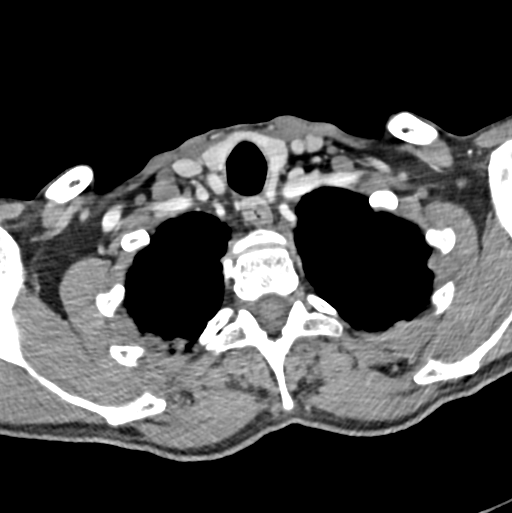
[im 46/138  bone]
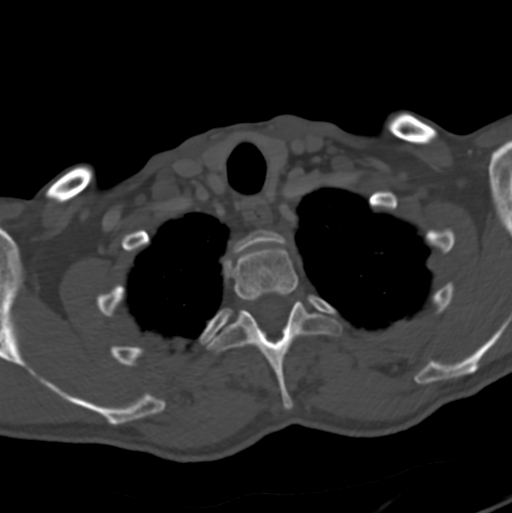
[im 92/138  bone]
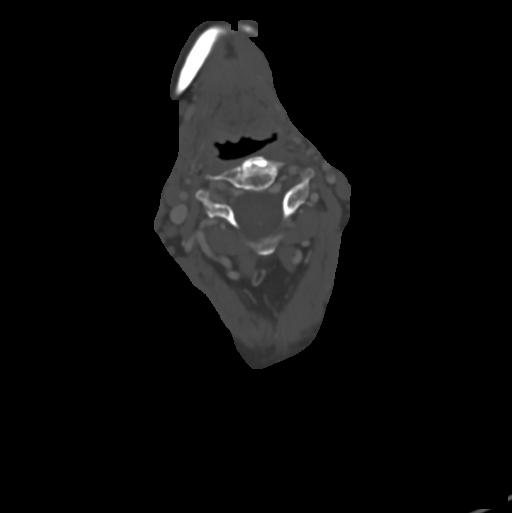

[Series 4: neck 2.00 br60 s3 bone/ no angle · axial · 0.51mm/px · z∈[-710,-618]mm · 2 of 139 slices shown]
[im 47/139  bone]
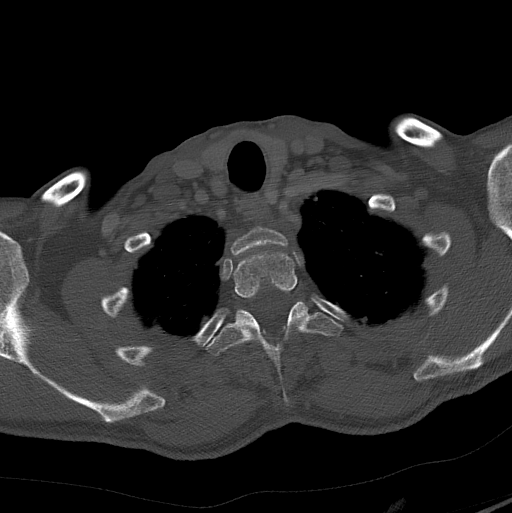
[im 93/139  bone]
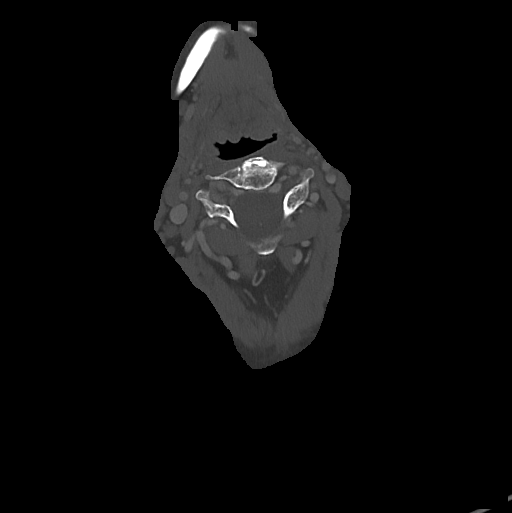

[Series 6: neck 2.00 br36 s3 angled axial (person_name) · axial · 0.51mm/px · z∈[-708,-616]mm · 2 of 139 slices shown]
[im 47/139  bone]
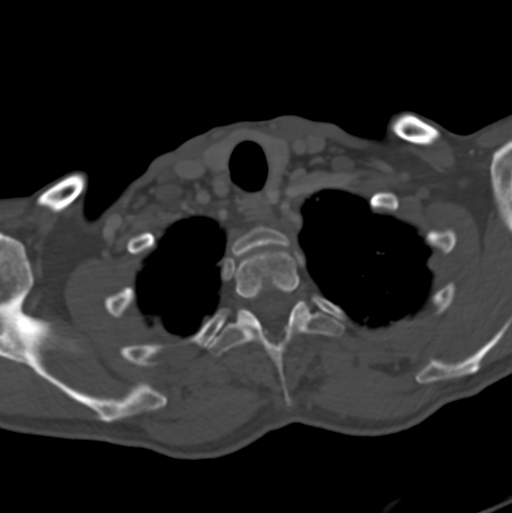
[im 93/139  bone]
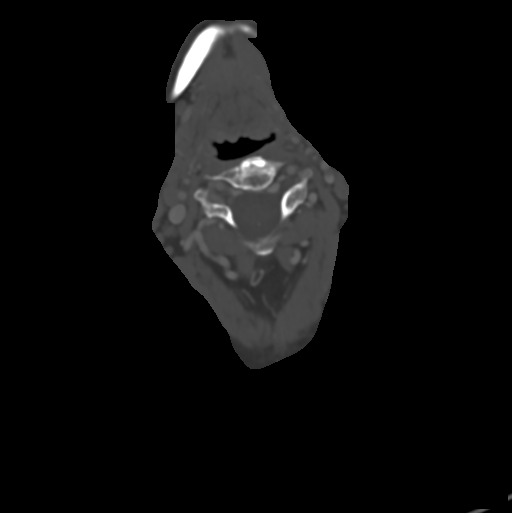

[Series 10: neck 2.00 br40 s3 (person_name) · coronal · 0.51mm/px · 3 of 144 slices shown (1 of 2)]
[im 29/144  bone]
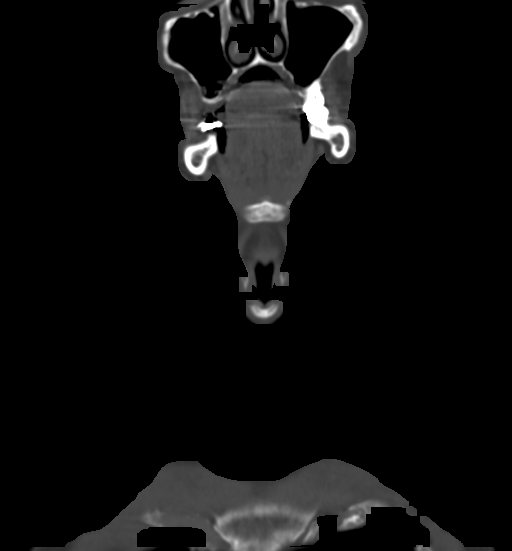
[im 58/144  bone]
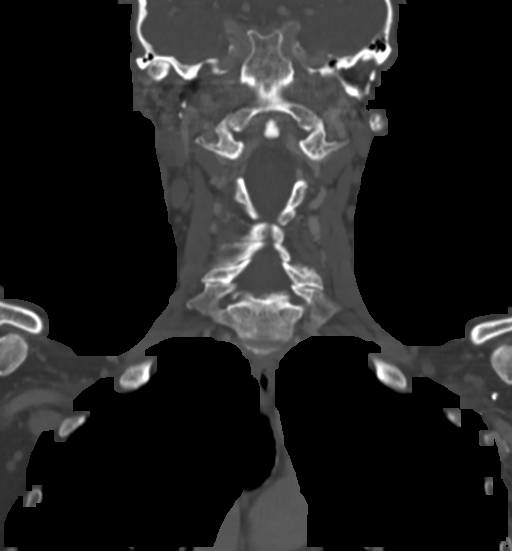
[im 86/144  bone]
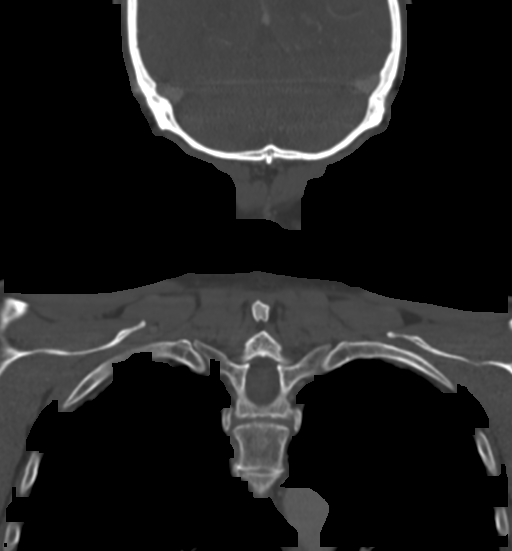

[Series 12: neck 2.00 br40 s3 (person_name) · sagittal · 0.51mm/px · 5 of 129 slices shown, 6 images (2 of 2)]
[im 43/129  bone]
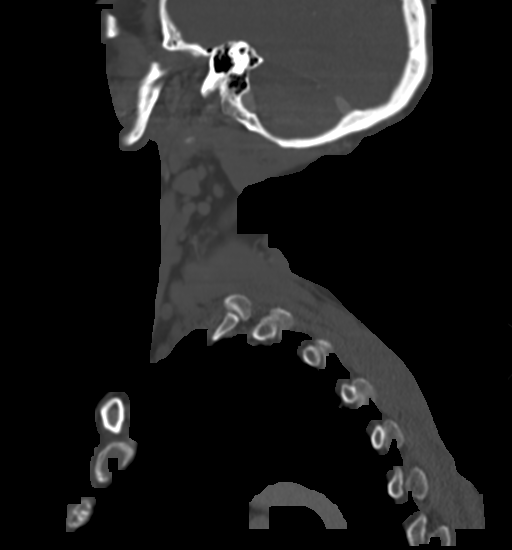
[im 54/129  bone]
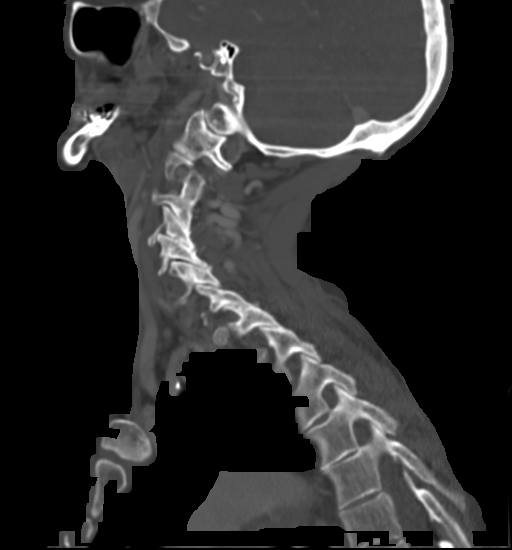
[im 65/129  soft-tissue]
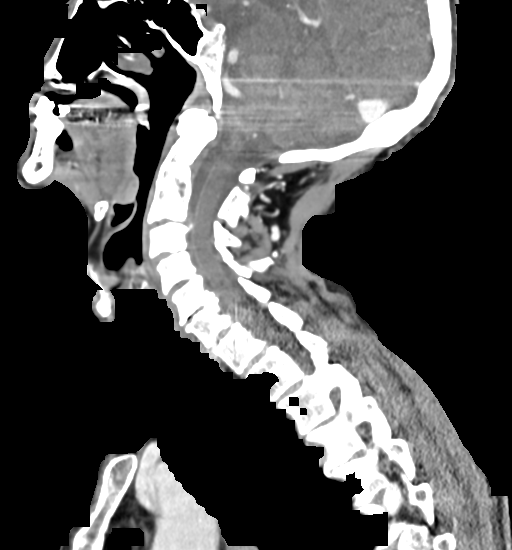
[im 65/129  bone]
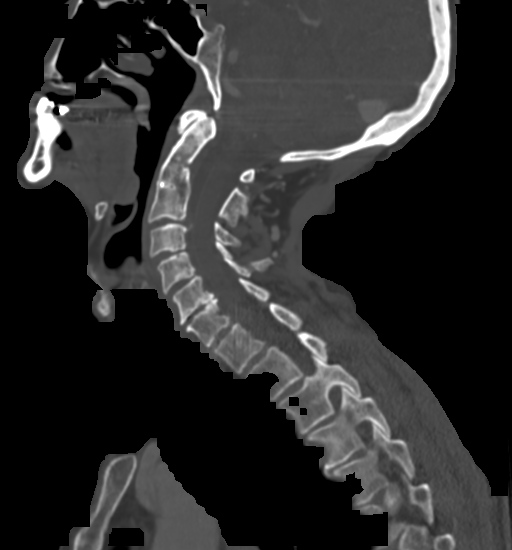
[im 75/129  bone]
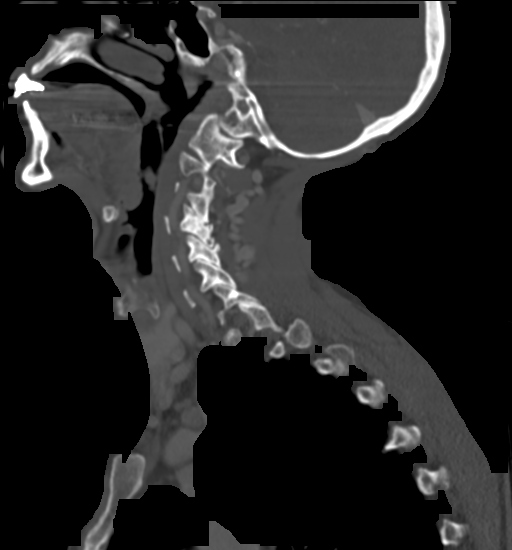
[im 86/129  bone]
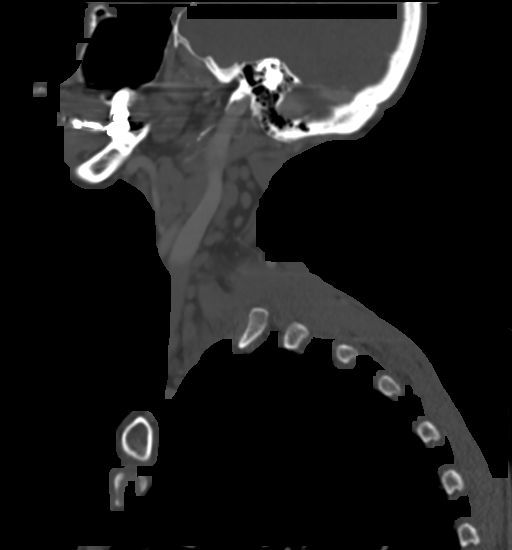

[14 of 35 positions shown; findings below may reference images not displayed]

FINDINGS: Pharynx and larynx: Larynx is within normal limits. There is mild
asymmetry of the lingual tonsil, greater on the right but within
normal limits. Palatine tonsils may be surgically absent. Adenoids
are diminutive. Other pharyngeal soft tissue contours are within
normal limits. Parapharyngeal and retropharyngeal spaces are within
normal limits.

Salivary glands: Sublingual space, submandibular glands, and parotid
glands are within normal limits.

Thyroid: Negative; subcentimeter hypodense right thyroid nodule Not
clinically significant; no follow-up imaging recommended (ref: [HOSPITAL]. [DATE]): 143-50).

Lymph nodes: Subcentimeter, but abnormally numerous bilateral
cervical lymph nodes (sagittal image 43 on the right levels 2 and
3). Many of the lymph nodes are 6-7 mm short axis, including a left
level IIIb node which corresponds to a marked area of clinical
concern (coronal image 63 and series 2, image 68. The largest nodes
are 8-9 mm short axis (right level IIIb or level 5 series 2, image
77). And nodes appear too numerous at all stations. No cystic or
necrotic nodes.

Vascular: Major vascular structures in the neck and at the skull
base are patent with mild tortuosity. Minimal ICA siphon calcified
atherosclerosis.

Limited intracranial: Negative.

Visualized orbits: Minimally included.

Mastoids and visualized paranasal sinuses: Well aerated bilaterally.

Skeleton: Congenital incomplete segmentation of the C2-C3 cervical
vertebrae. Superimposed exaggerated cervical lordosis, chronic disc
and endplate degeneration elsewhere. No acute or suspicious osseous
lesion identified.

Upper chest: Visible bilateral axillary lymph nodes appear small but
increased in number. But the visible mediastinal lymph nodes appear
relatively normal. Mild upper lobe lung scarring.
IMPRESSION: 1. Generalized cervical lymphadenopathy in the form of an increased
number of small solid nodes in the bilateral neck, and similar
visible axillary lymphadenopathy. All neck nodes are subcentimeter
short axis, including a 7 mm node at the left level IIIb station
which was marked as an area of concern.
The appearance is most suspicious for Leukemia or other
lymphoproliferative disorder.

2. No other acute finding. Congenital incomplete segmentation of the
C2-C3 cervical vertebrae with superimposed chronic cervical spine
degeneration.

## 2021-11-24 IMAGING — CT CT ABD-PELV W/ CM
2 of 5 series · 13 of 46 positions shown, 15 images · IV contrast (iopamidol)
Comparison: None.

CLINICAL DATA: Abnormal weight loss.

EXAM:
CT ABDOMEN AND PELVIS WITH CONTRAST
TECHNIQUE: Multidetector CT imaging of the abdomen and pelvis was performed
using the standard protocol following bolus administration of
intravenous contrast.
CONTRAST:  60mL [id number] IOPAMIDOL ([id number]) INJECTION 76%

[Series 4: abd pelvis 5.00 br40 s3 axial · axial · 0.59mm/px · z∈[+1199,+1599]mm · 10 of 90 slices shown, 12 images]
[im 5/90  soft-tissue]
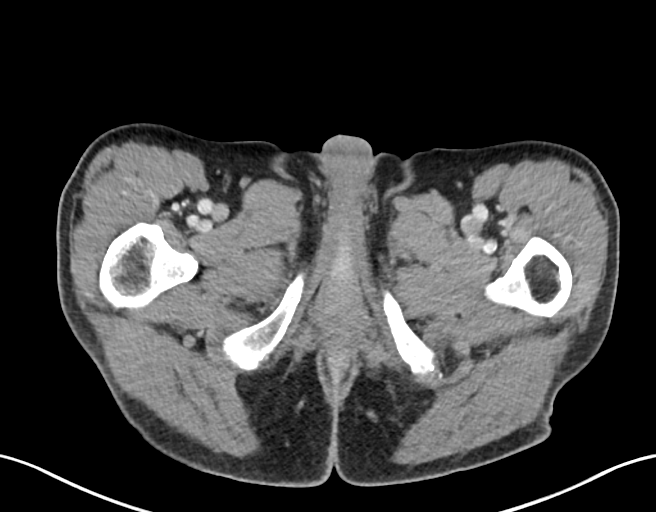
[im 5/90  bone]
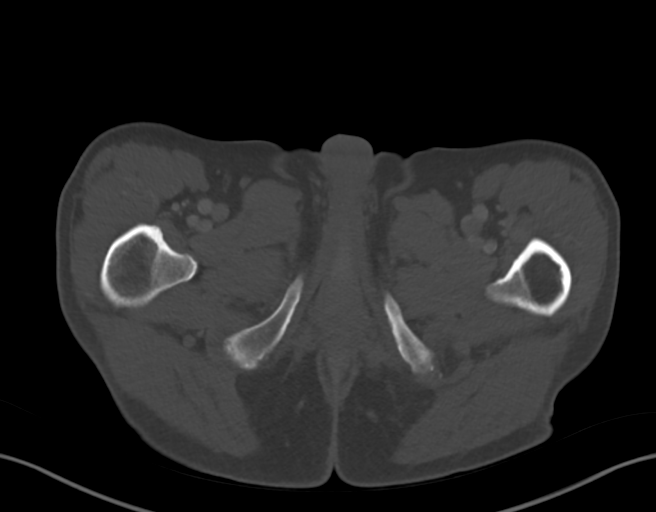
[im 14/90  soft-tissue]
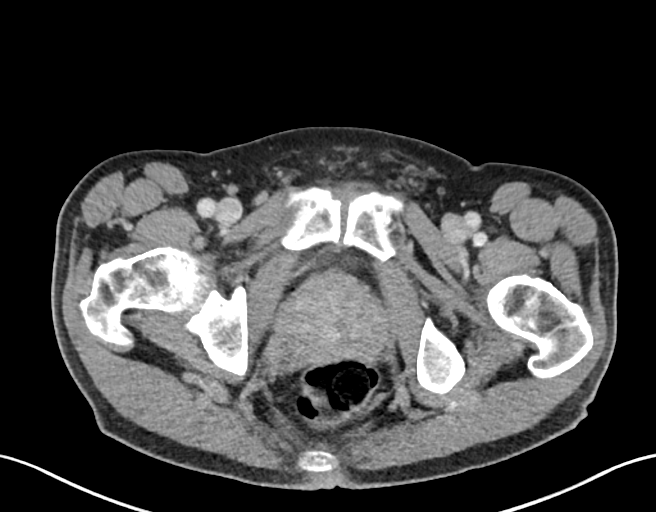
[im 23/90  soft-tissue]
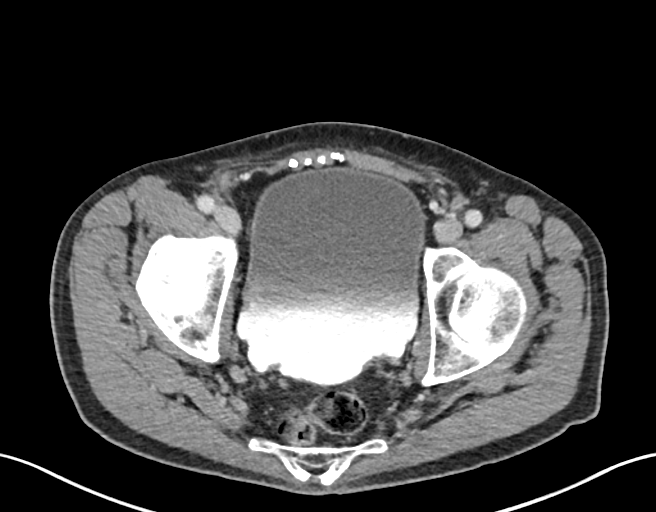
[im 32/90  soft-tissue]
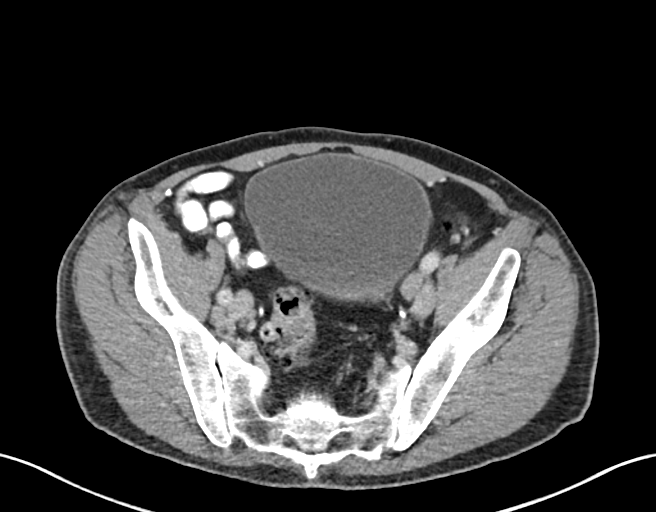
[im 41/90  soft-tissue]
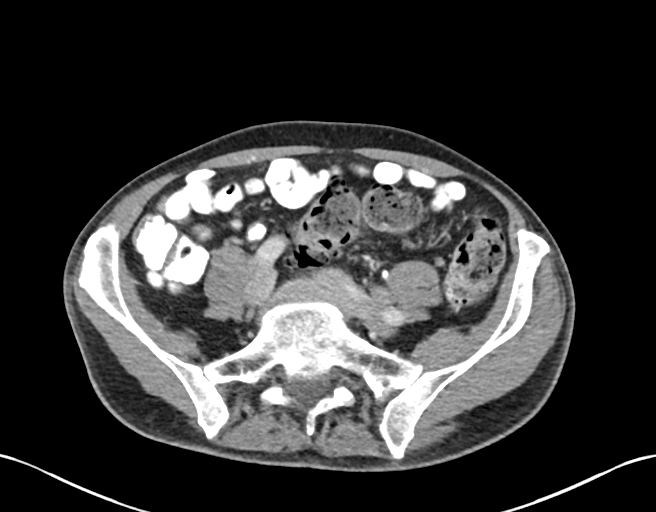
[im 49/90  soft-tissue]
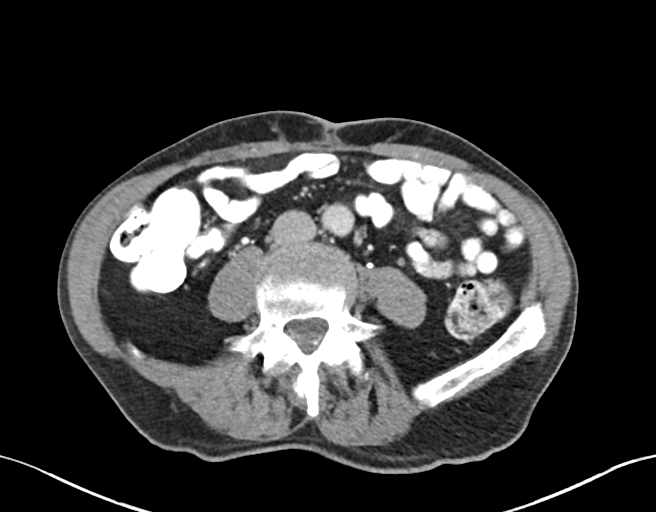
[im 58/90  soft-tissue]
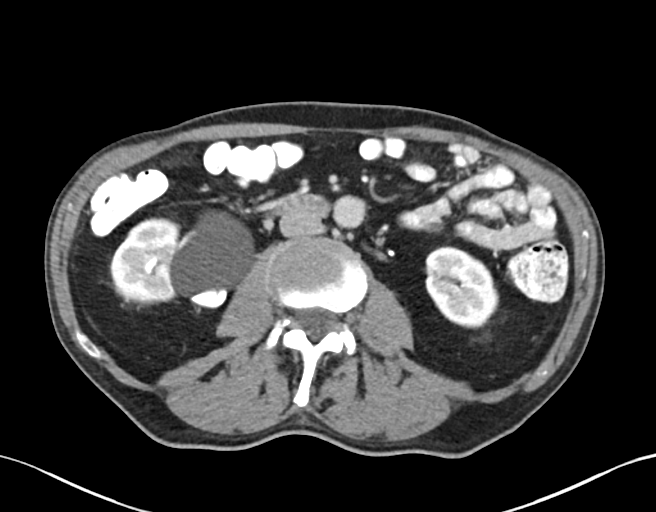
[im 67/90  soft-tissue]
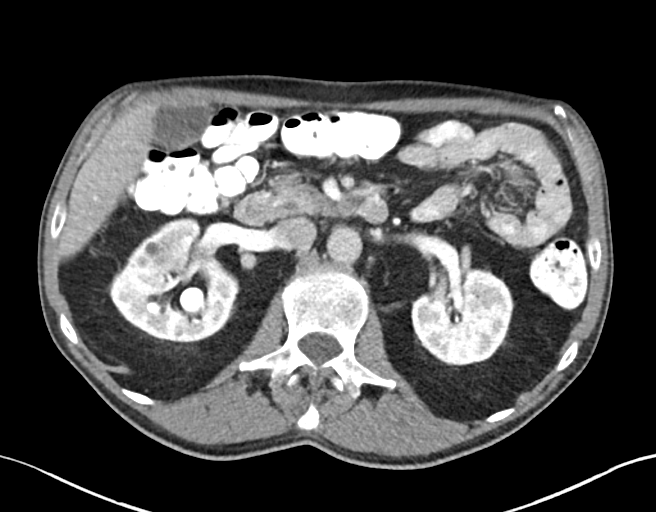
[im 76/90  soft-tissue]
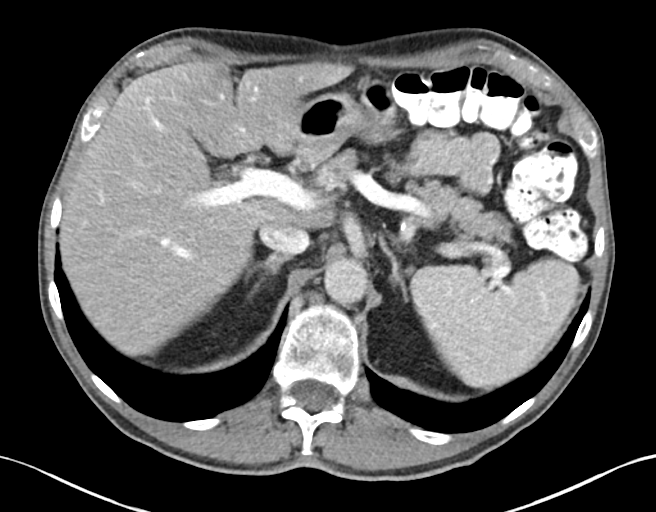
[im 76/90  bone]
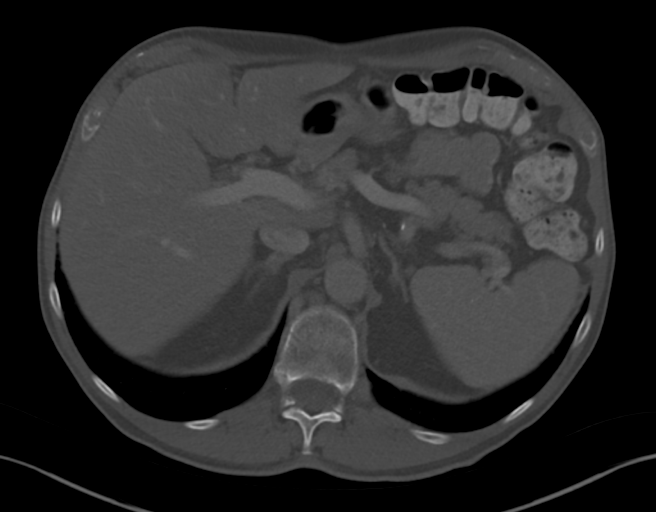
[im 85/90  soft-tissue]
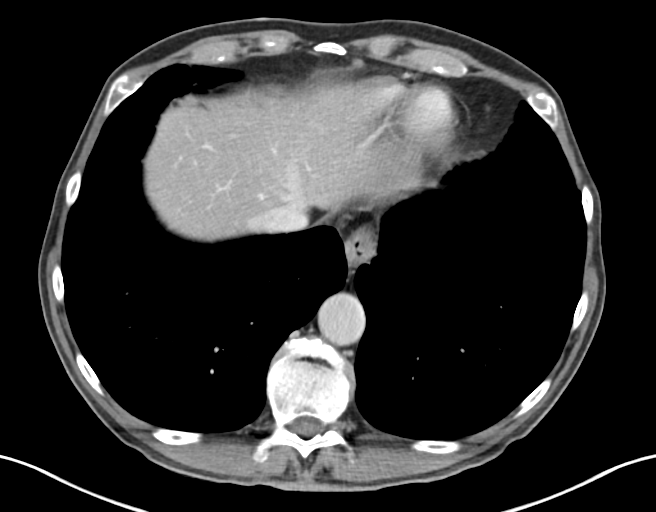

[Series 8: abd pelvis 2.00 br40 s3 cor · coronal · 0.69mm/px · 3 of 151 slices shown]
[im 51/151  soft-tissue]
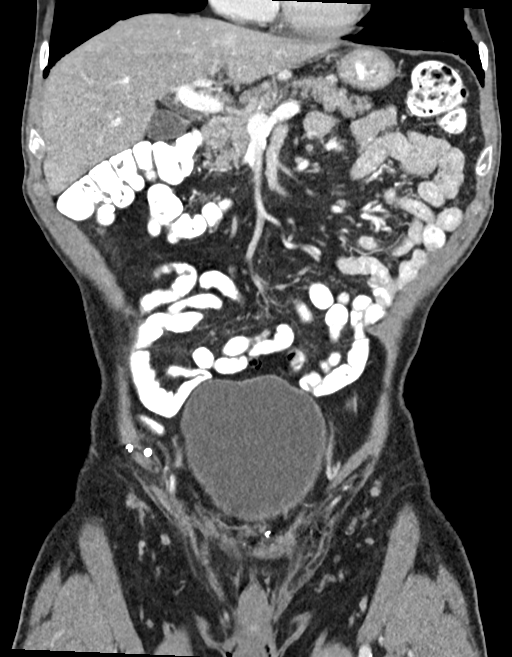
[im 67/151  soft-tissue]
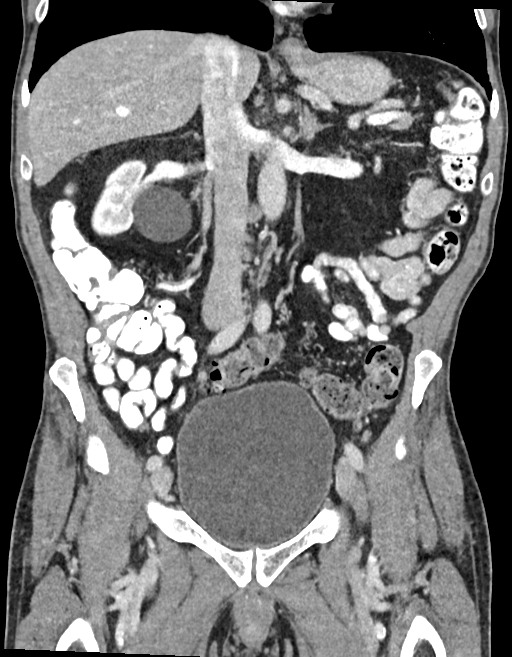
[im 84/151  soft-tissue]
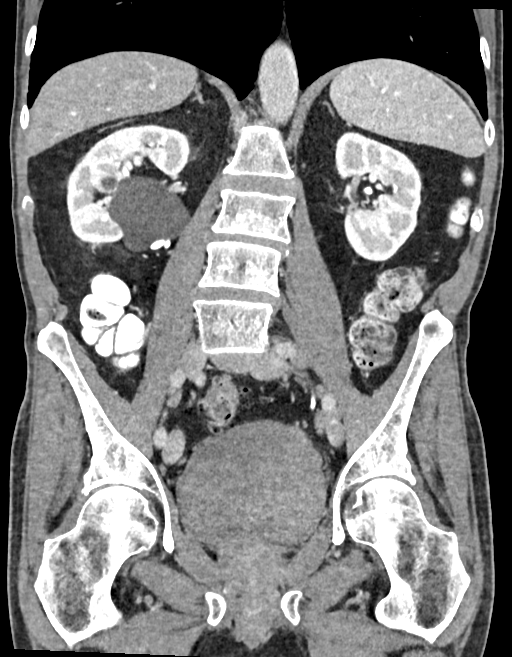

[13 of 46 positions shown; findings below may reference images not displayed]

FINDINGS: Lower chest: No acute abnormality.

Hepatobiliary: No focal liver abnormality is seen. No gallstones,
gallbladder wall thickening, or biliary dilatation.

Pancreas: Unremarkable. No pancreatic ductal dilatation or
surrounding inflammatory changes.

Spleen: Normal in size without focal abnormality.

Adrenals/Urinary Tract: Adrenal glands appear normal. Large right
parapelvic cyst is noted. No hydronephrosis or renal obstruction is
noted. Mild distention of urinary bladder is noted.

Stomach/Bowel: Stomach is within normal limits. Appendix appears
normal. No evidence of bowel wall thickening, distention, or
inflammatory changes.

Vascular/Lymphatic: No significant vascular findings are present. No
enlarged abdominal or pelvic lymph nodes.

Reproductive: Moderate prostatic enlargement is noted.

Other: No abdominal wall hernia or abnormality. No abdominopelvic
ascites.

Musculoskeletal: No acute or significant osseous findings.
IMPRESSION: Mild urinary bladder distention is noted.

Moderate prostatic enlargement.

No other significant abnormality seen in the abdomen or pelvis.

## 2021-11-25 ENCOUNTER — Ambulatory Visit: Payer: Medicare Other | Admitting: Occupational Therapy

## 2021-11-25 ENCOUNTER — Encounter: Payer: Self-pay | Admitting: Occupational Therapy

## 2021-11-25 DIAGNOSIS — R29818 Other symptoms and signs involving the nervous system: Secondary | ICD-10-CM | POA: Diagnosis not present

## 2021-11-25 DIAGNOSIS — R251 Tremor, unspecified: Secondary | ICD-10-CM | POA: Diagnosis not present

## 2021-11-25 DIAGNOSIS — M25621 Stiffness of right elbow, not elsewhere classified: Secondary | ICD-10-CM

## 2021-11-25 DIAGNOSIS — R293 Abnormal posture: Secondary | ICD-10-CM

## 2021-11-25 DIAGNOSIS — M25611 Stiffness of right shoulder, not elsewhere classified: Secondary | ICD-10-CM

## 2021-11-25 DIAGNOSIS — R278 Other lack of coordination: Secondary | ICD-10-CM | POA: Diagnosis not present

## 2021-11-25 DIAGNOSIS — R29898 Other symptoms and signs involving the musculoskeletal system: Secondary | ICD-10-CM | POA: Diagnosis not present

## 2021-11-25 DIAGNOSIS — M25622 Stiffness of left elbow, not elsewhere classified: Secondary | ICD-10-CM | POA: Diagnosis not present

## 2021-11-25 DIAGNOSIS — R2681 Unsteadiness on feet: Secondary | ICD-10-CM | POA: Diagnosis not present

## 2021-11-25 NOTE — Therapy (Signed)
[facility], [place], [address] ?Phone: [phone]   Fax:  [phone] ? ?Occupational Therapy Treatment ? ?Patient Details  ?Name: Joshua Browning ?MRN: [id number] ?Date of Birth: 08-06-1946 ?Referring Provider (OT): Dr. Wells Guiles Tat ? ? ?Encounter Date: 11/25/2021 ? ? OT End of Session - 11/25/21 0934   ? ? Visit Number 11   ? Number of Visits 17   ? Date for OT Re-Evaluation 12/20/21   ? Authorization Type UHC Medicare   ? Authorization - Visit Number 11   ? Authorization - Number of Visits 20   ? Progress Note Due on Visit 10   ? OT Start Time 0932   ? OT Stop Time 1015   ? OT Time Calculation (min) 43 min   ? Activity Tolerance Patient tolerated treatment well   ? Behavior During Therapy [facility] for tasks assessed/performed   ? ?  ?  ? ?  ? ? ?Past Medical History:  ?Diagnosis Date  ? BPH (benign prostatic hyperplasia)   ? Foley catheter in place   ? removed after turp  ? GERD (gastroesophageal reflux disease)   ? Nocturnal leg cramps   ? occ  ? Urinary retention   ? Wears glasses   ? ? ?Past Surgical History:  ?Procedure Laterality Date  ? CATARACT EXTRACTION W/ INTRAOCULAR LENS  IMPLANT, BILATERAL Bilateral   ? INGUINAL HERNIA REPAIR Bilateral RIGHT x 2 5366 umbilical done with last right  ? left last one done 10 yrs ago  ? RE-DO RIGHT INGUINAL HERNIA REPAIR AND UMBILICAL HERNIA REPAIR  2001  ? REMOVAL LEFT EYE INTRAOCULAR LENS AND REPLACEMENT   08-29-2013  ? TRANSURETHRAL RESECTION OF PROSTATE N/A 09/23/2013  ? Procedure: TRANSURETHRAL RESECTION OF THE PROSTATE (TURP) WITH GYRUS;  Surgeon: Claybon Jabs, MD;  Location: [place];  Service: Urology;  Laterality: N/A;  ? VARICOCELECTOMY Left 07/02/2018  ? Procedure: VARICOCELECTOMY;  Surgeon: Kathie Rhodes, MD;  Location: [facility];  Service: Urology;  Laterality: Left;  ? ? ?There were no vitals filed for this visit. ? ? Subjective Assessment -  11/25/21 0934   ? ? Subjective  "It's early"   ? Pertinent History Parkinson's Disease.  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 Patient Stated Goals improve ADLs (dressing), handwriting   ? Currently in Pain? No/denies   ? ?  ?  ? ?  ? ? ? ?Flipping cards with use of large amplitude movements with min cueing with each hand. ? ?Sliding cards across table with use of PWR! Hands with each hand with min cueing/difficulty. ? ?Completing Purdue Pegboard with each UE for incr coordination with min difficulty.  ? ?Standing, functional reaching with trunk rotation and lateral wt. Shifts to perform lateral and cross body reaching with coordination with each UE with min cueing and set-up for large amplitude movements. ? ?Standing, rolling ball up wall with BUEs for stretching/posture with min cueing.   ? ?Arm bike x5 min level 1 for reciprocal movement with cues/target of at least 40rpms for intensity while maintaining movement amplitude/reciprocal movement.   Pt maintained 40-44 rpms. ? ?Check remaining goals and discussed progress. ? ? ? ? ? ? ? ? OT Short Term Goals - 11/23/21 1116   ? ?  ? OT SHORT TERM GOAL #1  ? Title Pt will be independent with updated PD-specific HEP.--check STGs  11/20/21   ? Time 4   ? Period Weeks   ? Status Achieved   ?  ? OT SHORT TERM GOAL #2  ? Title Pt will demo at least 120* R shoulder flex with at least -15* elbow ext for overhead reach.   ? Baseline 110* with -20* elbow flex   ? Time 4   ? Period Weeks   ? Status Partially Met   11/23/21:  120* with -25* elbow ext  ?  ? OT SHORT TERM GOAL #3  ? Title Pt will demo at least 120* L shoulder flex with at least -20* elbow ext for overhead reach.   ? Baseline 120* with -30* elbow flex   ? Time 4   ? Period Weeks   ? Status Partially Met   11/23/21:  130* with -30*  ?  ? OT SHORT TERM GOAL #4  ? Title --   ?  ? OT SHORT TERM GOAL #5  ? Title --    ? ?  ?  ? ?  ? ? ? ? OT Long Term Goals - 11/25/21 0938   ? ?  ? OT LONG TERM GOAL #1  ? Title Pt will verbalize understanding of adaptive strategies for ADLs/IADLs in order to incr ease, incr safety, and decr risk of future complications (including eating, dressing, picking up items from floor, vacuuming, writing).   ? Time 8   ? Period Weeks   ? Status Achieved   reviewed on 11/18/21 - may benefit from further review  ?  ? OT LONG TERM GOAL #2  ? Title Pt will write a short paragraph with 100% legibility and no significant decrease in letter size   ? Time 8   ? Period Weeks   ? Status Achieved   11/18/21: 90%.  11/23/21:  met 100% legibility  ?  ? OT LONG TERM GOAL #3  ? Title Pt will demonstrate increased ease with dressing as eveidnced by decreasing PPT#4 (don/ doff jacket) to 18 secs or less   ? Baseline 20.85sec   ? Time 8   ? Period Weeks   ? Status Achieved   11.19 secs, 15 sec  ?  ? OT LONG TERM GOAL #4  ? Title Pt will demonstrate improved ease with dressing as eveidenced by fastening/unfastening 3 buttons in 28 secs or less   ? Baseline 31sec   ? Time 8   ? Period Weeks   ? Status Achieved   28.07 secs, 11/18/21: 28.78 sec  ?  ? OT LONG TERM GOAL #5  ? Title Pt will demo incr ease with eating as shown by performing PPT#2 in 10sec or less.   ? Baseline 12.03sec   ? Time 8   ? Period Weeks   ? Status Not Met   11.35 secs, 11/18/21: 10.25 sec (improved)  ? ?  ?  ? ?  ? ? ? ? ? ? ? ? Plan - 11/25/21 0937   ? ? Clinical Impression Statement Pt has made good progress towards goals with improved movement quality.   ? OT Occupational Profile and History Detailed Assessment- Review of Records and additional review of physical, cognitive, psychosocial history related to current functional performance   ? Occupational performance deficits (Please refer to evaluation for details): ADL's;IADL's;Leisure   ? Body Structure / Function / Physical Skills ADL;Tone;Dexterity;Balance;UE functional use;Body  mechanics;IADL;ROM;Endurance;Improper spinal/pelvic alignment;Coordination;Flexibility;Mobility;FMC   ? Cognitive Skills Memory   ? Rehab Potential Good   ? Clinical  Decision Making Several treatment options, min-mod task modification necessary   ? Comorbidities Affecting Occupational Performance: May have comorbidities impacting occupational performance   ? Modification or Assistance to Complete Evaluation  Min-Moderate modification of tasks or assist with assess necessary to complete eval   ? OT Frequency 2x / week   ? OT Duration 8 weeks   +eval (for scheduling purposes, but anticipate d/c after 6 weeks)  ? OT Treatment/Interventions Self-care/ADL training;Moist Heat;DME and/or AE instruction;Splinting;Balance training;Therapeutic activities;Therapeutic exercise;Cognitive remediation/compensation;Passive range of motion;Functional Mobility Training;Neuromuscular education;Cryotherapy;Energy conservation;Manual Therapy;Patient/family education   ? Plan d/c OT; schedule PD screens in approx 6 months.   ? Consulted and Agree with Plan of Care Patient   ? ?  ?  ? ?  ? ? ?Patient will benefit from skilled therapeutic intervention in order to improve the following deficits and impairments:   ?Body Structure / Function / Physical Skills: ADL, Tone, Dexterity, Balance, UE functional use, Body mechanics, IADL, ROM, Endurance, Improper spinal/pelvic alignment, Coordination, Flexibility, Mobility, FMC ?Cognitive Skills: Memory ?  ? ? ?Visit Diagnosis: ?Other symptoms and signs involving the nervous system ? ?Other lack of coordination ? ?Stiffness of right elbow, not elsewhere classified ? ?Stiffness of left elbow, not elsewhere classified ? ?Other symptoms and signs involving the musculoskeletal system ? ?Stiffness of right shoulder, not elsewhere classified ? ?Unsteadiness on feet ? ?Abnormal posture ? ?Tremor ? ? ? ?Problem List ?Patient Active Problem List  ? Diagnosis Date Noted  ? BPH (benign prostatic hypertrophy)  with urinary retention 09/23/2013  ? ? ? ?OCCUPATIONAL THERAPY DISCHARGE SUMMARY ? ?Visits from Start of Care: 11 ? ?Current functional level related to goals / functional outcomes: ?See above ?  ?Remaining deficits: ?Bradykinesia, rigid

## 2021-11-30 ENCOUNTER — Encounter: Payer: Medicare Other | Admitting: Occupational Therapy

## 2021-12-02 ENCOUNTER — Encounter: Payer: Medicare Other | Admitting: Occupational Therapy

## 2021-12-15 DIAGNOSIS — G43109 Migraine with aura, not intractable, without status migrainosus: Secondary | ICD-10-CM | POA: Diagnosis not present

## 2021-12-15 DIAGNOSIS — T8529XD Other mechanical complication of intraocular lens, subsequent encounter: Secondary | ICD-10-CM | POA: Diagnosis not present

## 2021-12-15 DIAGNOSIS — H35352 Cystoid macular degeneration, left eye: Secondary | ICD-10-CM | POA: Diagnosis not present

## 2021-12-15 DIAGNOSIS — Z961 Presence of intraocular lens: Secondary | ICD-10-CM | POA: Diagnosis not present

## 2021-12-19 ENCOUNTER — Other Ambulatory Visit: Payer: Self-pay | Admitting: Neurology

## 2021-12-19 DIAGNOSIS — G20 Parkinson's disease: Secondary | ICD-10-CM

## 2021-12-19 DIAGNOSIS — R251 Tremor, unspecified: Secondary | ICD-10-CM

## 2021-12-19 DIAGNOSIS — G20A1 Parkinson's disease without dyskinesia, without mention of fluctuations: Secondary | ICD-10-CM

## 2021-12-19 DIAGNOSIS — G2581 Restless legs syndrome: Secondary | ICD-10-CM

## 2021-12-20 NOTE — Progress Notes (Signed)
? ? ?Assessment/Plan:  ? ?1.  Parkinsons Disease  ? -Continue carbidopa/levodopa 25/100, 1.5 tablets 3 times per day ? -Continue carbidopa/levodopa 50/200 at bedtime ? -Continue clonazepam 0.5 mg, half tablet at bedtime for cramping and sleep. ? ?2.  MCI ? -Last neurocognitive testing was in 2017 with Dr. Si Raider.  I have not seen any significant change in him cognitively since that time. ? ?3.  Mild depression/insomnia ? -Doing well with mirtazapine, 15 mg at bedtime ? ?4.  B12 deficiency ? -On oral supplementation ? ?5.  Diplopia ? -This is due to dislocated intraocular lens implant.  Patient following with ophthalmology. ? ?6.  CLL ? -Diagnosed September, 2022. ? -Following with oncology. ? ?7.  Basal cell carcinoma ? -Following with dermatology. ? ?8.  Weight loss ? -As we have discussed previously, it is doubtful his weight loss is Parkinson's related.  His Parkinson's is far too mild to cause weight loss.  In addition, Parkinson's disease does not generally cause early satiety, unless there is significant constipation associated with it.  His dysphagia resolved with stretching esophagus, so that wasn't from Parkinsons Disease.  Would strongly encourage looking for another etiology.   ? -His weight has been fairly stable since October of last year.  He has had a fairly extensive work-up. ?            -he is already taking in extra protein ?            -on mirtazapine from me, which should, in theory, cause weight gain ? ?9.  Ankle/feet paresthesias ? -do EMG.  If EMG is negative, we can consider an MRI of the lumbar spine.  He is pretty hyperreflexic in the legs, but denies any back pain right now, so we decided to hold off on imaging. ? ? ?Subjective:  ? ?Joshua Browning was seen today in follow up for Parkinsons disease.  My previous records were reviewed prior to todays visit as well as outside records available to me.  Pt with wife who supplements hx.  He has been to therapy since our last visit.  Those  notes are reviewed.  Pt states legs numb around the ankles (esp in the evenings) and feet feel "thick, " when has no shoes on.   Legs feel "rubbery" mostly after PWR moves classes and after exercise and after working in the yard.  No back pain.  He does feel better overall after the [employer] class.  He has had no falls but has had near falls.    No hallucinations.  No near syncope.  Appetite is good, eating everything, including protein bars.   ? ?Current prescribed movement disorder medications: ?Carbidopa/levodopa 25/100, 1.5 tablet 3 times per day ?Carbidopa/levodopa 50/200 at bedtime ?Mirtazapine, 15 mg at bedtime ?Clonazepam 0.5 mg, half a tablet at bedtime  ?B12 supplement  ? ? ?ALLERGIES:  No Known Allergies ? ?CURRENT MEDICATIONS:  ?Outpatient Encounter Medications as of 12/21/2021  ?Medication Sig  ? carbidopa-levodopa (SINEMET CR) 50-200 MG tablet 1 po q hs  ? carbidopa-levodopa (SINEMET IR) 25-100 MG tablet Take 1.5 tablets by mouth 3 (three) times daily.  ? Cholecalciferol (VITAMIN D3) 1000 UNITS CAPS Take 1 capsule by mouth every evening.  ? clonazePAM (KLONOPIN) 0.5 MG tablet TAKE 1/2 TABLET BY MOUTH EVERY DAY AT BEDTIME  ? cyanocobalamin 1000 MCG tablet 1 tablet  ? famotidine (PEPCID) 20 MG tablet Take 20 mg by mouth at bedtime. Takes pepcid ac  ? Glucosamine Sulfate 1000  MG CAPS Take by mouth daily.   ? Magnesium 100 MG CAPS Take 1 capsule by mouth every evening.  ? mirtazapine (REMERON) 15 MG tablet TAKE 1 TABLET BY MOUTH EVERYDAY AT BEDTIME  ? pneumococcal 23 valent vaccine (PNEUMOVAX 23) 25 MCG/0.5ML injection Pharmacist to administer.  ? Potassium 99 MG TABS Take by mouth daily.  ? [DISCONTINUED] Multiple Vitamins-Minerals (VITABASIC SENIOR PO) Take 1 tablet by mouth. (Patient not taking: Reported on 10/21/2021)  ? ?No facility-administered encounter medications on file as of 12/21/2021.  ? ? ?Objective:  ? ?PHYSICAL EXAMINATION:   ? ?VITALS:   ?Vitals:  ? 12/21/21 1512  ?BP: 123/74  ?Pulse: 73   ?SpO2: 98%  ?Weight: 146 lb (66.2 kg)  ?Height: '5\' 10"'$  (1.778 m)  ? ? ? ?GEN:  The patient appears stated age and is in NAD. ?HEENT:  Normocephalic, atraumatic.  The mucous membranes are moist.  ? ?Neurological examination: ? ?Orientation: The patient is alert and oriented x3. ?Cranial nerves: There is good facial symmetry with minimal facial hypomimia. The speech is fluent and clear. Soft palate rises symmetrically and there is no tongue deviation. Hearing is intact to conversational tone. ?Sensation: Sensation is intact to light touch throughout.  Vibration is just slightly decreased distally.  No extinction with double simultaneous stimulation. ?Motor: Strength is at least antigravity x4. ?DTR's:  2- at bilateral biceps/triceps/BR and 3/4 at bilateral patella with cross adductors bilaterally ? ?Movement examination: ?Tone: There is nl tone in the bilateral UE ?Abnormal movements: no tremor today ?Coordination:  There is no decremation with RAM's, with any form of RAMS, including alternating supination and pronation of the forearm, hand opening and closing, finger taps, heel taps and toe taps. ?Gait and Station: The patient arises without using his hands.  He is forward flexed. The patient's stride length is slightly decreased and he circumducts the L leg (same as previous) ? ?I have reviewed and interpreted the following labs independently ? ?  Chemistry   ?   ?Component Value Date/Time  ? NA 142 05/13/2021 1512  ? K 4.6 05/13/2021 1512  ? CL 104 05/13/2021 1512  ? CO2 31 05/13/2021 1512  ? BUN 14 05/13/2021 1512  ? CREATININE 0.89 05/13/2021 1512  ? CREATININE 0.86 06/25/2019 1049  ?    ?Component Value Date/Time  ? CALCIUM 9.6 05/13/2021 1512  ? ALKPHOS 45 05/13/2021 1512  ? AST 16 05/13/2021 1512  ? ALT 5 05/13/2021 1512  ? BILITOT 0.5 05/13/2021 1512  ?  ? ? ? ?Lab Results  ?Component Value Date  ? WBC 7.8 11/09/2021  ? HGB 14.4 11/09/2021  ? HCT 44.2 11/09/2021  ? MCV 93.1 11/09/2021  ? PLT 150 11/09/2021   ? ? ?Lab Results  ?Component Value Date  ? TSH 1.49 06/25/2019  ? ?Lab Results  ?Component Value Date  ? BEEFEOFH21 269 06/25/2019  ? ? ? ?Total time spent on today's visit was 33 minutes, including both face-to-face time and nonface-to-face time.  Time included that spent on review of records (prior notes available to me/labs/imaging if pertinent), discussing treatment and goals, answering patient's questions and coordinating care. ? ?Cc:  Lawerance Cruel, MD ? ?

## 2021-12-21 ENCOUNTER — Encounter: Payer: Self-pay | Admitting: Neurology

## 2021-12-21 ENCOUNTER — Ambulatory Visit: Payer: Medicare Other | Admitting: Neurology

## 2021-12-21 VITALS — BP 123/74 | HR 73 | Ht 70.0 in | Wt 146.0 lb

## 2021-12-21 DIAGNOSIS — R292 Abnormal reflex: Secondary | ICD-10-CM

## 2021-12-21 DIAGNOSIS — G20 Parkinson's disease: Secondary | ICD-10-CM

## 2021-12-21 DIAGNOSIS — G629 Polyneuropathy, unspecified: Secondary | ICD-10-CM

## 2021-12-21 DIAGNOSIS — G6289 Other specified polyneuropathies: Secondary | ICD-10-CM | POA: Diagnosis not present

## 2021-12-21 NOTE — Patient Instructions (Signed)
Local and Online Resources for Power over Parkinson's Group ?May 2023 ? ?LOCAL [place] PARKINSON'S GROUPS  ?Power over Parkinson's Group:   ?Power Over Parkinson's Patient Education Group will be Wednesday, May 10th-*Hybrid meting*- in person at [facility] location and via [facility] at 2:00 pm.   ?Upcoming Power over Parkinson's Meetings:  2nd Wednesdays of the month at 2 pm:  May 10th, June 14th, July 12th ?Contact Joshua Browning at [email or web address] if interested in participating in this group ?Parkinson's Care Partners Group:    3rd Mondays, Contact Joshua Browning ?Atypical Parkinsonian Patient Group:   4th Wednesdays, Contact Joshua Browning ?If you are interested in participating in these groups with Joshua, please contact her directly for how to join those meetings.  Her contact information is [email or web address].   ? ?LOCAL EVENTS AND NEW OFFERINGS ?Moving Day [place]:  Saturday, May 6th, 9:30 am at [place], [place], [place]. Participate in Moving Day as a way to ?honor loved ones, raise funds, fight Parkinson's disease, and celebrate movement.?  Register today at [email or web address] ?[place]!  Play [place]!  Join [place] for home game for a fun evening to bring awareness of Parkinson's and raise funds for our Movement Disorder Funds. Rescheduled to May 11th  6:30 pm [place]. To purchase tickets:  [email or web address] ?Parkinson's T-shirts for sale!  Designed by a local group member, with funds going to [place].  $25.00  Contact Joshua to purchase  ?New PWR! Moves [employer] Instructor-Led Class offering at [employer]!  Wednesdays 1-2 pm, starting April 12th.   Contact Joshua Browning, Acupuncturist at [employer].  Manuela [email or web address] ? ?ONLINE EDUCATION AND SUPPORT ?[place]:  www.[email or web address] ?PD Health at Home continues:  Mindfulness  Mondays, Wellness Wednesdays, Fitness Fridays  ?Upcoming Education:  ?Understanding Gene and Cell-Based Therapies in Parkinson's.  Wednesday, May 10th at 1:00 pm ?Additional Education offerings virtually through their website-upcoming topics include Palliative Care/Hospice and PD, Sleep and PD ?Register for expert briefings Cytogeneticist) at [email or web address] ?Please check out their website to sign up for emails and see their full online offerings ? ? ?[place]:  [email or web address]  ?Third Thursday Webinars:  On the third Thursday of every month at 12 p.m. ET, join our free live webinars to learn about various aspects of living with Parkinson's disease and our work to speed medical breakthroughs. ?Upcoming Webinar: Get Moving: Exercising for a Healthy Brain.  Thursday, May 18th  at  12 noon. ?Check out additional information on their website to see their full online offerings ? ?[place]:  www.[email or web address] ?Upcoming Webinar:   Stay tuned ?Webinar Series:  Living with Parkinson's Meetup.   Third Thursdays each month, 3 pm ?Care Partner Monthly Meetup.  With Joshua Browning [place].  First Tuesday of each month, 2 pm ?Check out additional information to Live Well Today on their website ? ?Parkinson and Movement Disorders (PMD) Alliance:  www.pmdalliance.org ?[employer] Online:  Online Education Events ?Sign up for emails, which are sent weekly to give you updates on programming and online offerings ? ?Parkinson's Association of the [place]:  www.parkinsonassociation.org ?Information on online support groups, education events, and online exercises including Yoga, Parkinson's exercises and more-LOTS of information on links to PD resources and online events ?Virtual Support Group through [employer] of the [place]; next one is scheduled for Wednesday, May 3rd at 2 pm. (These are typically  scheduled for the 1st Wednesday of the month at 2 pm).  Visit website for details. ?MOVEMENT AND  EXERCISE OPPORTUNITIES ?Parkinson's DRUMMING Classes/Music Therapy with Joshua Browning:  This is a returning class and it's FREE!  2nd Mondays, continuing May 8th, 11:00 at the [place].  Contact *Joshua Browning at [email or web address] or Joshua Browning at [phone] or [email or web address]  ?PWR! Moves Classes at [place].  Wednesdays 10 and 11 am.   Contact Joshua Browning, PT [email or web address] if interested. ?NEW PWR! Moves Class offering at [employer].  Wednesdays 1-2 pm, starting April 12th.  Contact Joshua Browning, Acupuncturist at [employer].  [email or web address] [email or web address] ?Here is a link to the PWR!Moves classes on Zoom from [place] - Daily Mon-Sat at 10:00. Via Zoom, FREE and open to all.  There is also a link below via Facebook if you use that platform. ? ?[email or web address].si ?https://www.PrepaidParty.no ? ?Parkinson's Wellness Recovery (PWR! Moves)  www.pwr4life.org ?Info on the PWR! Virtual Experience:  You will have access to our expertise through self-assessment, guided plans that start with the PD-specific fundamentals, educational content, tips, Q&A with an expert, and a growing Art therapist of PD-specific pre-recorded and live exercise classes of varying types and intensity - both physical and cognitive! If that is not enough, we offer 1:1 wellness consultations (in-person or virtual) to personalize your PWR! Research scientist (medical).  ?[employer] Fridays:  ?As part of the PD Health @ Home program, this free video series focuses each week on one aspect of fitness designed to support people living with  Parkinson's.  These weekly videos highlight the [place] recent fitness guidelines for people with Parkinson's disease. ?www.KVTVnet.com.cy ?Dance for PD website is offering free, live-stream classes throughout the week, as well as links to [name] of classes:  https://danceforparkinsons.org/ ?Virtual dance and Pilates for Parkinson's classes: Click on the Community Tab> Parkinson's Movement Initiative Tab.  To register for classes and for more information, visit www.SeekAlumni.co.za and click the ?community? tab.  ?[employer] Parkinson's Cycling Classes  ?[employer]:  Thursdays @ Noon-Live classes at [employer] ([employer] at [email or web address] or [phone]) ?[employer]: Virtual Classes Mondays and Thursdays Jeanette Caprice classes Tuesday, Wednesday and Thursday (contact [place] at [email or web address]  or [phone]) ?[employer] ?Varied levels of classes are offered Mondays, Tuesdays and Thursdays at [employer].  ?Stretching with Verdis Frederickson weekly class is also offered for people with Parkinson's ?To observe a class or for more information, call [phone] or email Hezzie Bump at [email or web address] .[email or web address] ?ADDITIONAL SUPPORT AND RESOURCES ?[employer]:Online Caregiver Education Opportunities:  www.well-springsolutions.org/caregiver-education/caregiver-support-group.  You may also contact Vickki Muff at [email or web address] or [phone].    ?[employer] Navigator:  Just1Navigator program, a free service to help individuals and families through the journey of determining care for older adults.  The ?Navigator? is a [phone], Education officer, museum, who will speak with a prospective client and/or loved ones to provide an assessment of the situation and a set of recommendations for a personalized care plan -- all free of charge, and whether [employer]  offers the needed service or not. If the need is not a service we provide, we are well-connected with reputable programs in town that we can refer you to.  www.well-springsolutions.org or to speak with the Navigator,

## 2022-01-06 IMAGING — RF DG ESOPHAGUS
5 series · 14 of 16 positions shown · non-contrast
Comparison: CT neck dated April 29, 2021.

CLINICAL DATA: Dysphagia.  Weight loss.

EXAM:
ESOPHAGUS/BARIUM SWALLOW/TABLET STUDY
TECHNIQUE: Combined double and single contrast examination was performed using
effervescent crystals, high-density barium, and thin liquid barium.
This exam was performed by Theresa Atencio, and was supervised and
interpreted by Yeye Puma.
FLUOROSCOPY TIME:  Radiation Exposure Index (as provided by the
fluoroscopic device): 0.7 mGy
Fluoroscopy Time:  30 seconds
Number of Acquired Images:  0

[Series 1: sequence · 3 of 25 frames shown (1 of 3)]
[frame 3/25]
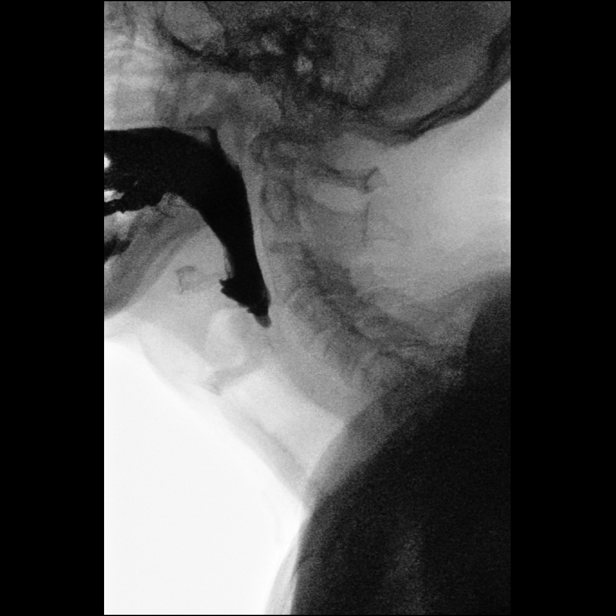
[frame 4/25]
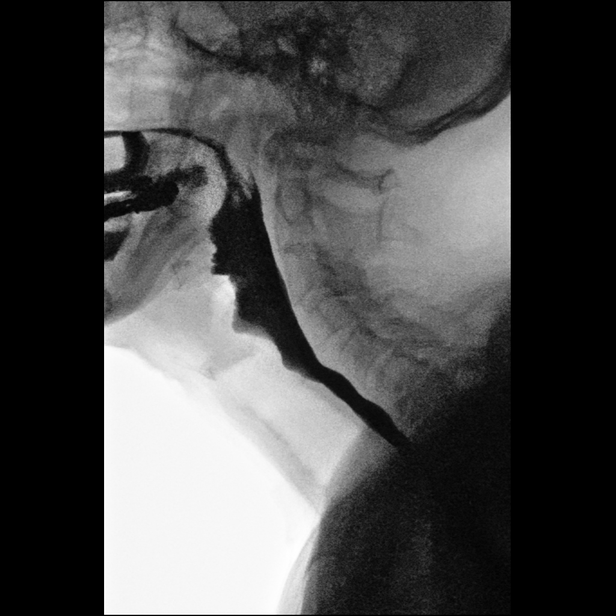
[frame 13/25]
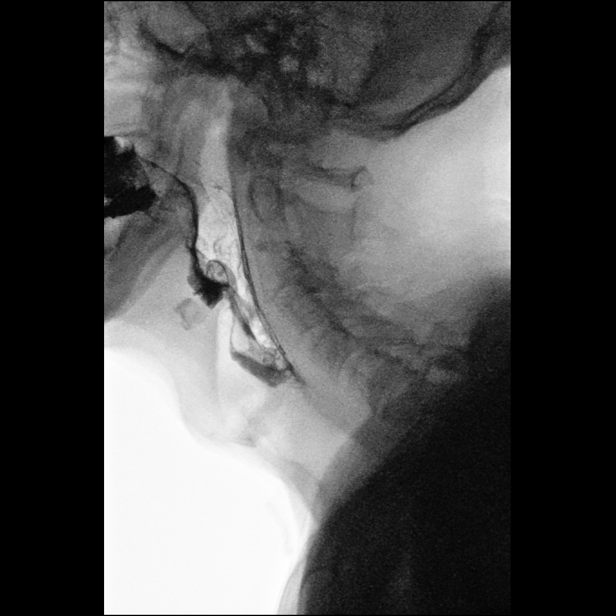

[Series 2: sequence · 3 of 27 frames shown (2 of 3)]
[frame 5/27]
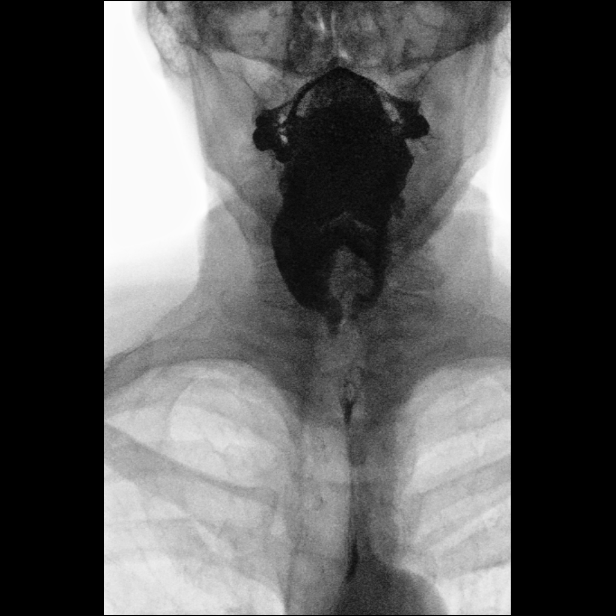
[frame 14/27]
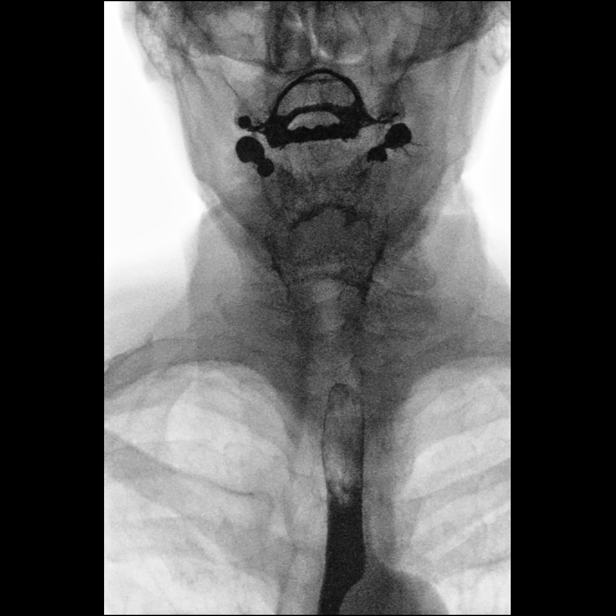
[frame 23/27]
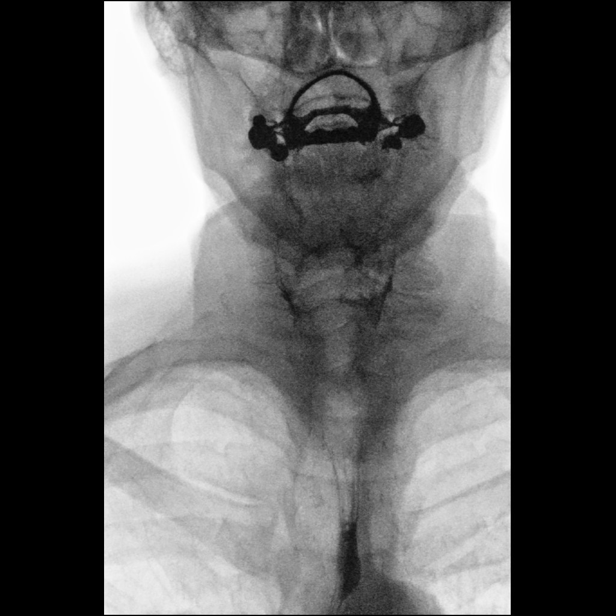

[Series 3: one shot · 1 of 1 slices shown (1 of 2)]
[im 1/1]
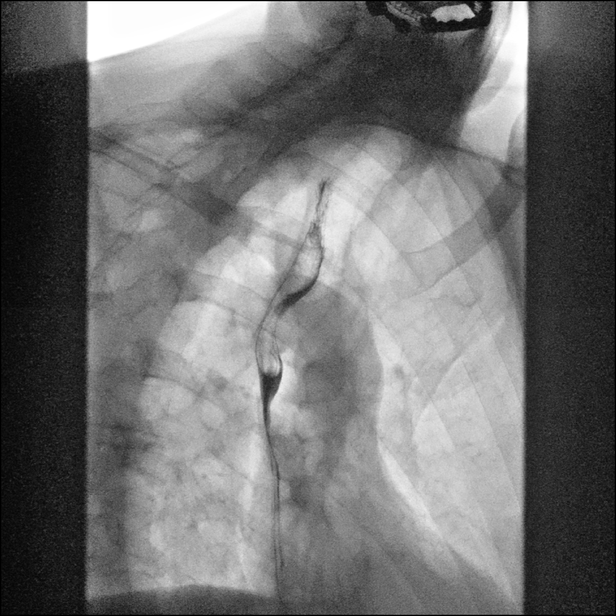

[Series 4: sequence · 3 of 20 frames shown (3 of 3)]
[frame 4/20]
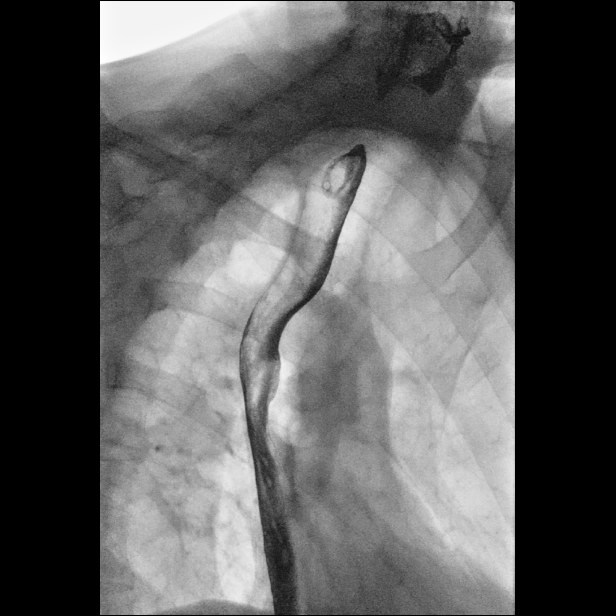
[frame 11/20]
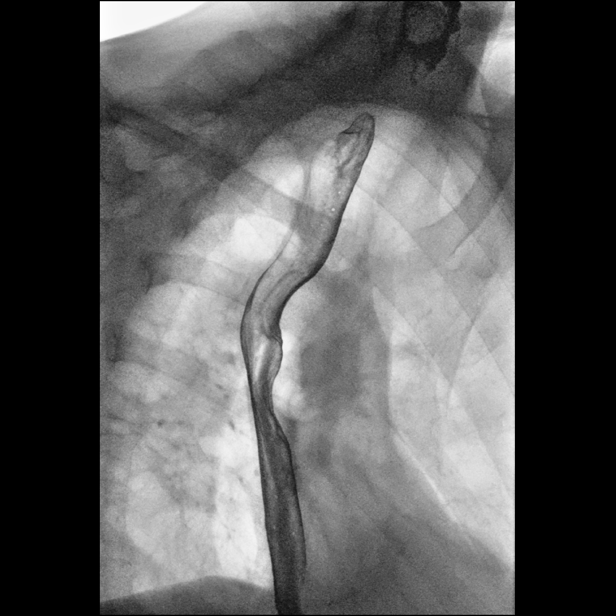
[frame 18/20]
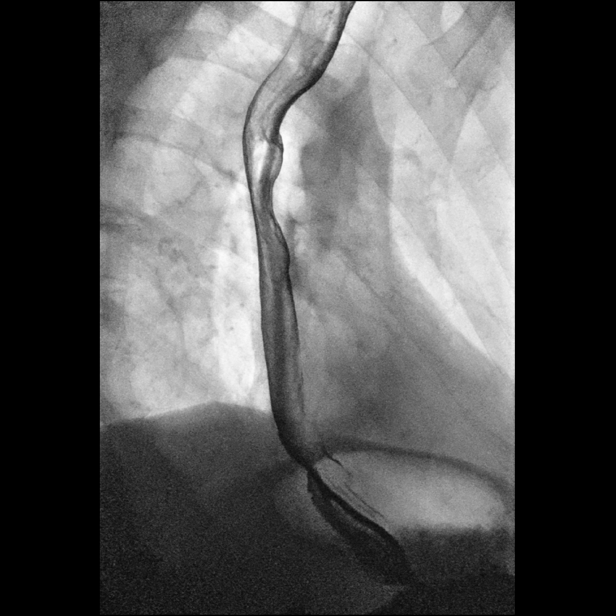

[Series 5: one shot · 4 of 4 slices shown (2 of 2)]
[im 1/4]
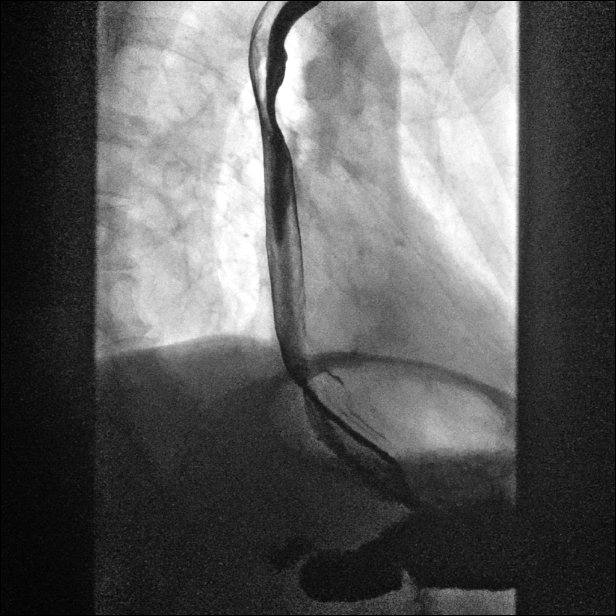
[im 2/4]
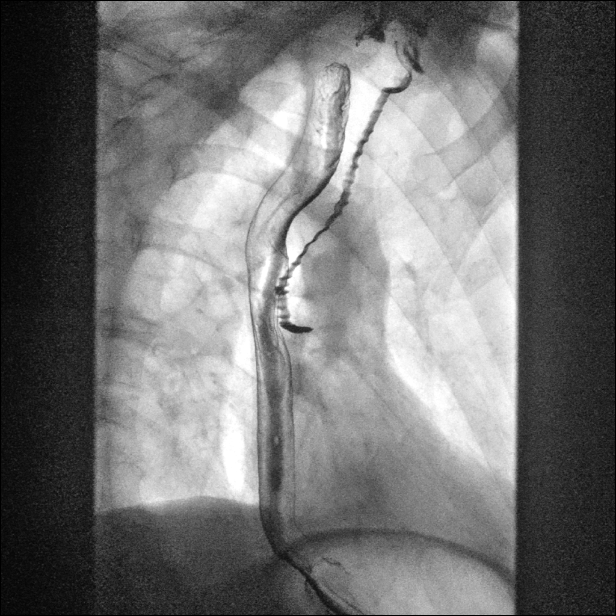
[im 3/4]
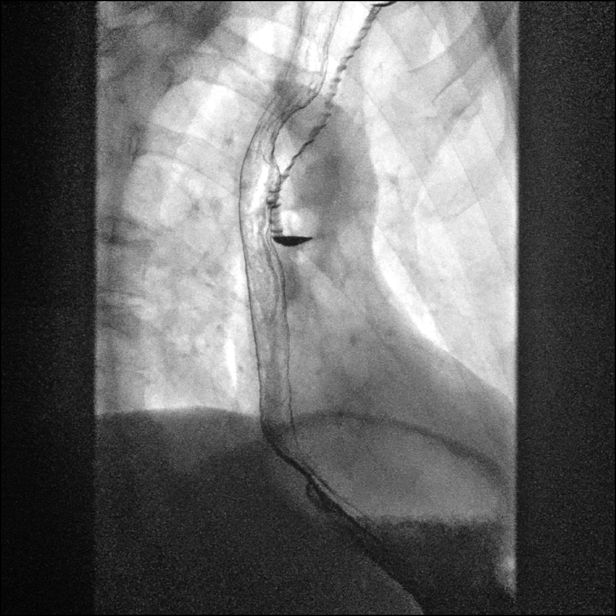
[im 4/4]
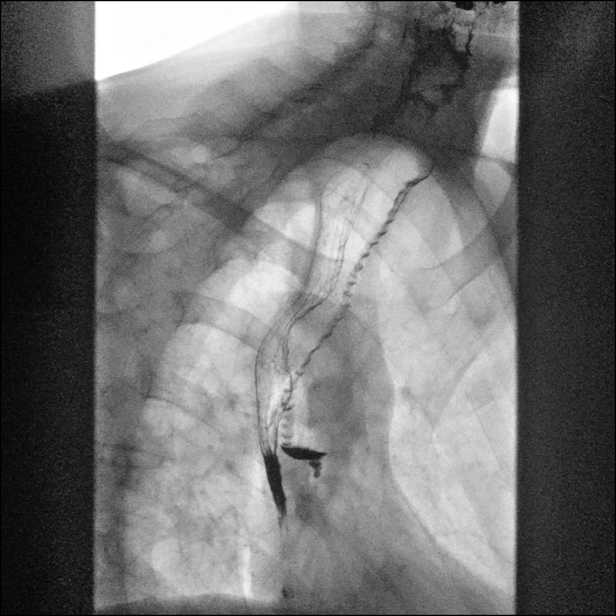

[14 of 16 positions shown; findings below may reference images not displayed]

FINDINGS: Swallowing: Normal with thin barium. However, the patient aspirated
after a few swallows of thick barium and the examination was ended
early.

Pharynx: Unremarkable.

Esophagus: Normal appearance.

Esophageal motility: Within normal limits.

Hiatal Hernia: None.

Gastroesophageal reflux: Not evaluated.

Ingested 13mm barium tablet: Not given due to aspiration.

Other: None.
IMPRESSION: 1. Limited study. Examination was ended early after the patient
aspirated a small amount of thick barium.
2. Grossly unremarkable pharynx and esophagus.  No hiatal hernia.

## 2022-01-26 DIAGNOSIS — T8529XD Other mechanical complication of intraocular lens, subsequent encounter: Secondary | ICD-10-CM | POA: Diagnosis not present

## 2022-01-26 DIAGNOSIS — G43109 Migraine with aura, not intractable, without status migrainosus: Secondary | ICD-10-CM | POA: Diagnosis not present

## 2022-01-26 DIAGNOSIS — Z961 Presence of intraocular lens: Secondary | ICD-10-CM | POA: Diagnosis not present

## 2022-02-02 DIAGNOSIS — Z833 Family history of diabetes mellitus: Secondary | ICD-10-CM | POA: Diagnosis not present

## 2022-02-02 DIAGNOSIS — Z131 Encounter for screening for diabetes mellitus: Secondary | ICD-10-CM | POA: Diagnosis not present

## 2022-02-09 DIAGNOSIS — Z0000 Encounter for general adult medical examination without abnormal findings: Secondary | ICD-10-CM | POA: Diagnosis not present

## 2022-02-17 DIAGNOSIS — Z961 Presence of intraocular lens: Secondary | ICD-10-CM | POA: Diagnosis not present

## 2022-02-17 DIAGNOSIS — T8529XD Other mechanical complication of intraocular lens, subsequent encounter: Secondary | ICD-10-CM | POA: Diagnosis not present

## 2022-02-20 ENCOUNTER — Other Ambulatory Visit: Payer: Self-pay | Admitting: Neurology

## 2022-02-20 DIAGNOSIS — G2581 Restless legs syndrome: Secondary | ICD-10-CM

## 2022-02-20 DIAGNOSIS — G20 Parkinson's disease: Secondary | ICD-10-CM

## 2022-02-20 DIAGNOSIS — R251 Tremor, unspecified: Secondary | ICD-10-CM

## 2022-02-21 ENCOUNTER — Other Ambulatory Visit: Payer: Self-pay

## 2022-02-21 DIAGNOSIS — R251 Tremor, unspecified: Secondary | ICD-10-CM

## 2022-02-21 DIAGNOSIS — G2581 Restless legs syndrome: Secondary | ICD-10-CM

## 2022-02-21 DIAGNOSIS — G20 Parkinson's disease: Secondary | ICD-10-CM

## 2022-02-21 MED ORDER — MIRTAZAPINE 15 MG PO TABS: ORAL_TABLET | ORAL | 0 refills | Status: DC

## 2022-03-03 DIAGNOSIS — R350 Frequency of micturition: Secondary | ICD-10-CM | POA: Diagnosis not present

## 2022-03-17 ENCOUNTER — Other Ambulatory Visit: Payer: Self-pay | Admitting: Neurology

## 2022-03-17 DIAGNOSIS — G20 Parkinson's disease: Secondary | ICD-10-CM

## 2022-03-17 DIAGNOSIS — G2581 Restless legs syndrome: Secondary | ICD-10-CM

## 2022-03-17 DIAGNOSIS — R251 Tremor, unspecified: Secondary | ICD-10-CM

## 2022-03-21 ENCOUNTER — Other Ambulatory Visit: Payer: Self-pay | Admitting: Neurology

## 2022-03-21 DIAGNOSIS — G20 Parkinson's disease: Secondary | ICD-10-CM

## 2022-03-29 DIAGNOSIS — T8529XA Other mechanical complication of intraocular lens, initial encounter: Secondary | ICD-10-CM | POA: Diagnosis not present

## 2022-03-29 DIAGNOSIS — T8529XD Other mechanical complication of intraocular lens, subsequent encounter: Secondary | ICD-10-CM | POA: Diagnosis not present

## 2022-04-07 DIAGNOSIS — T8522XA Displacement of intraocular lens, initial encounter: Secondary | ICD-10-CM | POA: Diagnosis not present

## 2022-04-07 DIAGNOSIS — T8529XA Other mechanical complication of intraocular lens, initial encounter: Secondary | ICD-10-CM | POA: Diagnosis not present

## 2022-04-11 ENCOUNTER — Encounter: Payer: Medicare Other | Admitting: Neurology

## 2022-04-13 ENCOUNTER — Other Ambulatory Visit: Payer: Self-pay | Admitting: Neurology

## 2022-04-13 DIAGNOSIS — G2581 Restless legs syndrome: Secondary | ICD-10-CM

## 2022-04-13 DIAGNOSIS — R251 Tremor, unspecified: Secondary | ICD-10-CM

## 2022-04-13 DIAGNOSIS — G20 Parkinson's disease: Secondary | ICD-10-CM

## 2022-04-28 ENCOUNTER — Ambulatory Visit: Payer: Medicare Other | Admitting: Neurology

## 2022-04-28 DIAGNOSIS — G629 Polyneuropathy, unspecified: Secondary | ICD-10-CM

## 2022-04-28 DIAGNOSIS — M5417 Radiculopathy, lumbosacral region: Secondary | ICD-10-CM

## 2022-04-28 NOTE — Procedures (Signed)
[facility]  [place], [address]  [place], [place] [address] Tel: [phone] Fax:  [phone] Test Date:  04/28/2022  Patient: Joshua Browning DOB: 08-Mar-1946 Physician: Narda Amber, DO  Sex: Male Height: '5\' 10"'$  Ref Phys: Alonza Bogus, D.O.  ID#: [id number]   Technician:    Patient Complaints: This is a 76 year old man referred for evaluation of bilateral feet paresthesias.  NCV & EMG Findings: Extensive electrodiagnostic testing of the right lower extremity and additional studies of the left shows:  Bilateral sural and superficial peroneal sensory responses are within normal limits. Bilateral peroneal motor responses at the extensor digitorum brevis and tibial motor responses show reduced amplitudes (R1.4, L1.0, R1.9, L2.6 mV).  Bilateral peroneal motor responses at the tibialis anterior are within normal limits.   Bilateral tibial H reflex studies show prolonged latencies.   Chronic motor axonal loss changes are seen affecting all the tested muscles involving the L3-S1 myotomes bilaterally, without accompanied active denervation.    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 There is no evidence of a large fiber sensorimotor polyneuropathy.   ___________________________ Narda Amber, DO    Nerve Conduction Studies Anti Sensory Summary Table   Stim Site NR Peak (ms) Norm Peak (ms) O-P Amp (V) Norm O-P Amp  Left Sup Peroneal Anti Sensory (Ant Lat Mall)  32C  12 cm    2.9 <4.6 6.3 >3  Right Sup Peroneal Anti Sensory (Ant Lat Mall)  32C  12 cm    2.8 <4.6 7.5 >3  Left Sural Anti Sensory (Lat Mall)  32C  Calf    3.4 <4.6 6.8 >3  Right Sural Anti Sensory (Lat Mall)  32C  Calf    4.8 <4.6 6.4 >3   Motor Summary Table   Stim Site NR Onset (ms) Norm Onset (ms) O-P Amp (mV) Norm O-P Amp Site1 Site2 Delta-0 (ms) Dist (cm) Vel (m/s) Norm Vel (m/s)  Left Peroneal Motor (Ext Dig Brev)  32C   Ankle    4.2 <6.0 1.0 >2.5 B Fib Ankle 9.1 39.0 43 >40  B Fib    13.3  0.8  Poplt B Fib 1.7 8.0 47 >40  Poplt    15.0  0.8         Right Peroneal Motor (Ext Dig Brev)  32C  Ankle    4.1 <6.0 1.4 >2.5 B Fib Ankle 8.3 38.0 46 >40  B Fib    12.4  1.1  Poplt B Fib 2.1 9.0 43 >40  Poplt    14.5  1.1         Left Peroneal TA Motor (Tib Ant)  32C  Fib Head    2.9 <4.5 3.7 >3 Poplit Fib Head 1.8 8.0 44 >40  Poplit    4.7  3.4         Right Peroneal TA Motor (Tib Ant)  32C  Fib Head    3.7 <4.5 3.6 >3 Poplit Fib Head 1.8 9.0 50 >40  Poplit    5.5  3.6         Left Tibial Motor (Abd Hall Brev)  32C  Ankle    5.2 <6.0 2.6 >4 Knee Ankle 8.7 42.0 48 >40  Knee    13.9  1.7         Right Tibial Motor (Abd Hall Brev)  32C  Ankle    4.2 <6.0 1.9 >4 Knee Ankle 10.1 43.0 43 >40  Knee    14.3  1.1          H Reflex Studies   NR H-Lat (ms) Lat Norm (ms) L-R H-Lat (ms)  Left Tibial (Gastroc)  32C     38.23 <35 1.77  Right Tibial (Gastroc)  32C     40.00 <35 1.77   EMG   Side Muscle Ins Act Fibs Fasc Recrt Dur. Amp. Poly. Activation Comment  Right AntTibialis Nml Nml Nml 2- 1+ 2+ 1+ Nml N/A  Right Gastroc Nml Nml Nml 2- 1+ 2+ 1+ Nml N/A  Right Flex Dig Long Nml Nml Nml 3- 1+ 2+ 1+ Nml N/A  Right RectFemoris Nml Nml Nml 2- 1+ 1+ 1+ Nml N/A  Right GluteusMed Nml Nml Nml 2- 1+ 1+ 1+ Nml N/A  Right AdductorLong Nml Nml Nml 2- 1+ 1+ 1+ Nml N/A  Right BicepsFemS Nml Nml Nml 2- 1+ 1+ 1+ Nml N/A  Left AntTibialis Nml Nml Nml 2- 1+ 2+ 1+ Nml N/A  Left Gastroc Nml Nml Nml 2- 1+ 2+ 1+ Nml N/A  Left RectFemoris Nml Nml Nml 2- 1+ 1+ 1+ Nml N/A      Waveforms:

## 2022-05-03 ENCOUNTER — Other Ambulatory Visit: Payer: Self-pay

## 2022-05-03 DIAGNOSIS — M5416 Radiculopathy, lumbar region: Secondary | ICD-10-CM

## 2022-05-09 ENCOUNTER — Ambulatory Visit
Admission: RE | Admit: 2022-05-09 | Discharge: 2022-05-09 | Disposition: A | Discharge status: Home / Self Care | Payer: Medicare Other | Source: Ambulatory Visit | Attending: Neurology | Admitting: Neurology

## 2022-05-09 DIAGNOSIS — M5416 Radiculopathy, lumbar region: Secondary | ICD-10-CM

## 2022-05-09 DIAGNOSIS — R200 Anesthesia of skin: Secondary | ICD-10-CM | POA: Diagnosis not present

## 2022-05-09 DIAGNOSIS — M545 Low back pain, unspecified: Secondary | ICD-10-CM | POA: Diagnosis not present

## 2022-05-10 ENCOUNTER — Other Ambulatory Visit: Payer: Medicare Other

## 2022-05-11 ENCOUNTER — Encounter: Payer: Self-pay | Admitting: Neurology

## 2022-05-12 ENCOUNTER — Inpatient Hospital Stay: Payer: Medicare Other | Admitting: Oncology

## 2022-05-12 ENCOUNTER — Inpatient Hospital Stay: Payer: Medicare Other | Attending: Oncology

## 2022-05-12 ENCOUNTER — Other Ambulatory Visit (HOSPITAL_BASED_OUTPATIENT_CLINIC_OR_DEPARTMENT_OTHER): Payer: Self-pay

## 2022-05-12 VITALS — BP 130/75 | HR 73 | Temp 98.1°F | Resp 18 | Ht 70.0 in | Wt 142.2 lb

## 2022-05-12 DIAGNOSIS — R634 Abnormal weight loss: Secondary | ICD-10-CM | POA: Insufficient documentation

## 2022-05-12 DIAGNOSIS — C9110 Chronic lymphocytic leukemia of B-cell type not having achieved remission: Secondary | ICD-10-CM

## 2022-05-12 DIAGNOSIS — R1310 Dysphagia, unspecified: Secondary | ICD-10-CM | POA: Insufficient documentation

## 2022-05-12 DIAGNOSIS — R599 Enlarged lymph nodes, unspecified: Secondary | ICD-10-CM | POA: Diagnosis not present

## 2022-05-12 DIAGNOSIS — G20 Parkinson's disease: Secondary | ICD-10-CM | POA: Diagnosis not present

## 2022-05-12 LAB — CBC WITH DIFFERENTIAL (CANCER CENTER ONLY)
Abs Immature Granulocytes: 0.01 10*3/uL (ref 0.00–0.07)
Basophils Absolute: 0 10*3/uL (ref 0.0–0.1)
Basophils Relative: 0 %
Eosinophils Absolute: 0.1 10*3/uL (ref 0.0–0.5)
Eosinophils Relative: 1 %
HCT: 43.4 % (ref 39.0–52.0)
Hemoglobin: 14.4 g/dL (ref 13.0–17.0)
Immature Granulocytes: 0 %
Lymphocytes Relative: 65 %
Lymphs Abs: 4.6 10*3/uL — ABNORMAL HIGH (ref 0.7–4.0)
MCH: 31.1 pg (ref 26.0–34.0)
MCHC: 33.2 g/dL (ref 30.0–36.0)
MCV: 93.7 fL (ref 80.0–100.0)
Monocytes Absolute: 0.5 10*3/uL (ref 0.1–1.0)
Monocytes Relative: 7 %
Neutro Abs: 2 10*3/uL (ref 1.7–7.7)
Neutrophils Relative %: 27 %
Platelet Count: 147 10*3/uL — ABNORMAL LOW (ref 150–400)
RBC: 4.63 MIL/uL (ref 4.22–5.81)
RDW: 13.8 % (ref 11.5–15.5)
WBC Count: 7.2 10*3/uL (ref 4.0–10.5)
nRBC: 0 % (ref 0.0–0.2)

## 2022-05-12 MED ORDER — INFLUENZA VAC A&B SA ADJ QUAD 0.5 ML IM PRSY
PREFILLED_SYRINGE | INTRAMUSCULAR | 0 refills | Status: DC
  Filled 2022-05-12: qty 0.5, 1d supply, fill #0

## 2022-05-12 NOTE — Progress Notes (Signed)
[facility] OFFICE PROGRESS NOTE   Diagnosis: CLL  INTERVAL HISTORY:   Joshua Browning returns as scheduled.  He reports improvement in dysphagia after undergoing an esophagus dilation.  He is followed by neurology for Parkinson disease and foot numbness.  He underwent a recent lumbar MRI.  No fever, night sweats, anorexia, or change in palpable lymph nodes.  No recent infection.  Objective:  Vital signs in last 24 hours:  Blood pressure 130/75, pulse 73, temperature 98.1 F (36.7 C), temperature source Oral, resp. rate 18, height '5\' 10"'$  (1.778 m), weight 142 lb 3.2 oz (64.5 kg), SpO2 100 %.    Lymphatics: Soft 1 cm bilateral low posterior cervical and axillary nodes.  No inguinal nodes. Resp: Lungs clear bilaterally Cardio: Regular rate and rhythm GI: No hepatosplenomegaly Vascular: No leg edema  Lab Results:  Lab Results  Component Value Date   WBC 7.2 05/12/2022   HGB 14.4 05/12/2022   HCT 43.4 05/12/2022   MCV 93.7 05/12/2022   PLT 147 (L) 05/12/2022   NEUTROABS 2.0 05/12/2022    CMP  Lab Results  Component Value Date   NA 142 05/13/2021   K 4.6 05/13/2021   CL 104 05/13/2021   CO2 31 05/13/2021   GLUCOSE 97 05/13/2021   BUN 14 05/13/2021   CREATININE 0.89 05/13/2021   CALCIUM 9.6 05/13/2021   PROT 6.9 05/13/2021   ALBUMIN 4.8 05/13/2021   AST 16 05/13/2021   ALT 5 05/13/2021   ALKPHOS 45 05/13/2021   BILITOT 0.5 05/13/2021   GFRNONAA >60 05/13/2021    No results found for: "CEA1", "CEA", "SLH734", "CA125"  No results found for: "INR", "LABPROT"  Imaging:  MR LUMBAR SPINE WO CONTRAST  Result Date: 05/10/2022 CLINICAL DATA:  Lumbar radiculopathy, bilateral lower extremity and feet numbness EXAM: MRI LUMBAR SPINE WITHOUT CONTRAST TECHNIQUE: Multiplanar, multisequence MR imaging of the lumbar spine was performed. No intravenous contrast was administered. COMPARISON:  No prior MRI of the lumbar spine, correlation is made with CT abdomen  pelvis 04/29/2021 FINDINGS: Segmentation: In correlation with the prior CT abdomen pelvis, the last rib-bearing vertebral body is labeled T12, and there are 5 lumbar-type vertebral bodies. Lumbarization of S1, with the last disc space at S1-S2. Alignment: S shaped curvature of the thoracolumbar spine, with levocurvature of the lumbar spine. 5 mm anterolisthesis L5 on S1. Vertebrae: No acute fracture or suspicious osseous lesion. Decreased and heterogeneous marrow signal, which is nonspecific; although this can be caused by an infiltrative marrow process, the most common causes include anemia, obesity, or tobacco use. Conus medullaris and cauda equina: Conus extends to the L1-L2 level. Conus and cauda equina appear normal. Paraspinal and other soft tissues: Right renal cysts, for which no follow-up is indicated. The bladder is quite distended. Disc levels: T12-L1: No significant disc bulge. Mild facet arthropathy. No spinal canal stenosis or neural foraminal narrowing. L1-L2: No significant disc bulge. Mild facet arthropathy. No spinal canal stenosis or neural foraminal narrowing. L2-L3: No significant disc bulge. Mild facet arthropathy. No spinal canal stenosis or neural foraminal narrowing. L3-L4: Minimal disc bulge. Mild facet arthropathy. No spinal canal stenosis or neural foraminal narrowing. L4-L5: Minimal disc bulge. Moderate facet arthropathy. No spinal canal stenosis. Mild right neural foraminal narrowing. L5-S1: Grade 1 anterolisthesis with disc unroofing. Severe left and moderate right facet arthropathy. Narrowing of the left lateral recess. No spinal canal stenosis. Mild left neural foraminal narrowing. IMPRESSION: 1. L5-S1 mild left neural foraminal narrowing and narrowing of the left  lateral recess, which could affect the descending left S1 nerve roots. 2. L4-L5 mild right neural foraminal narrowing. 3. Multilevel facet arthropathy, which is worst on the left at L5-S1, which can be a cause of back pain.  4. Transitional anatomy, with lumbarization of S1. Please correlate with imaging if any intervention is planned. Electronically Signed   By: Merilyn Baba M.D.   On: 05/10/2022 19:21    Medications: I have reviewed the patient's current medications.   Assessment/Plan: CLL Small bilateral cervical and axillary lymph nodes CT neck 04/29/2021-subcentimeter but abnormally numerous bilateral cervical lymph nodes.  Largest node 8 to 9 mm.  Small bilateral axillary lymph nodes.  Stable mediastinal lymph nodes appear relatively normal. Peripheral blood flow cytometry 05/13/2021-monoclonal lambda restricted B-cell population with expression of CD20, CD5, and CD200 Weight loss Early satiety Mild dysphagia Parkinson's   Disposition: Mr. Arriaga appears stable from a hematologic standpoint.  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  He will seek medical attention for symptoms of an infection.  He will remain up-to-date on influenza and COVID-19 vaccines.  He will receive a pneumococcal 20 vaccine when he is here in 6 months.  Mr. Grissinger will return for an office and lab visit in 6 months.  Betsy Coder, MD  05/12/2022  11:13 AM

## 2022-05-20 ENCOUNTER — Other Ambulatory Visit: Payer: Self-pay | Admitting: Neurology

## 2022-05-20 DIAGNOSIS — G2581 Restless legs syndrome: Secondary | ICD-10-CM

## 2022-05-20 DIAGNOSIS — G20A1 Parkinson's disease without dyskinesia, without mention of fluctuations: Secondary | ICD-10-CM

## 2022-05-20 DIAGNOSIS — R251 Tremor, unspecified: Secondary | ICD-10-CM

## 2022-05-26 ENCOUNTER — Ambulatory Visit: Payer: Medicare Other | Attending: Neurology | Admitting: Occupational Therapy

## 2022-05-26 ENCOUNTER — Ambulatory Visit: Payer: Medicare Other | Admitting: Physical Therapy

## 2022-05-26 ENCOUNTER — Ambulatory Visit: Payer: Medicare Other | Admitting: Speech Pathology

## 2022-05-26 DIAGNOSIS — R471 Dysarthria and anarthria: Secondary | ICD-10-CM

## 2022-05-26 DIAGNOSIS — R29818 Other symptoms and signs involving the nervous system: Secondary | ICD-10-CM

## 2022-05-26 NOTE — Therapy (Addendum)
[facility] [address] [place], [place], [address] Phone: [phone]   Fax:  [phone]  Patient Details  Name: Joshua Browning MRN: [id number] Date of Birth: 1945-11-16 Referring Provider:  Daisy Floro, MD  Encounter Date: 05/26/2022  Speech Therapy Parkinson's Disease Screen   Decibel Level today: 68 dB  (WNL=70-72 dB) with sound level meter 30cm away from pt's masked mouth. Pt's conversational volume has maintained since last treatment course  Pt does not report difficulty with swallowing, which does not warrant further evaluation   Pt does does not require speech therapy services at this time. Recommend ST screen in another 6-8 months  Maia Breslow, CCC-SLP 05/26/2022, 8:36 AM  [facility] [address] [place], [place], [address] Phone: [phone]   Fax:  [phone]

## 2022-05-26 NOTE — Therapy (Signed)
[facility] [address] [place], [place], [address] Phone: [phone]   Fax:  [phone]  Patient Details  Name: Joshua Browning MRN: [id number] Date of Birth: 1946-02-25 Referring Provider:  Lawerance Cruel, MD  Encounter Date: 05/26/2022  Physical Therapy Parkinson's Disease Screen   Timed Up and Go test: 10.5 seconds (previously 8.78 seconds)   10 meter walk test:10.87 seconds = 3.02 ft/sec (previously 9.84 seconds = 3.33 ft/sec)  5 time sit to stand test: 10.72 seconds with no UE support (previously  10.87 seconds)  Pt reports going to the [employer] classes 1x a week and feels better after them. Walking 1x a day. No falls or almost falls. Feels like his balance is doing well.   Patient does not require Physical Therapy services at this time.  Recommend Physical Therapy screen in 6 months.     Arliss Journey, PT, DPT 05/26/2022, 8:27 AM  [facility] [address] [place], [place], [address] Phone: [phone]   Fax:  [phone]

## 2022-05-26 NOTE — Therapy (Signed)
[facility] [address] [place], [place], [address] Phone: [phone]   Fax:  [phone]  Patient Details  Name: Joshua Browning MRN: [id number] Date of Birth: 11-02-1945 Referring Provider:  Lawerance Cruel, MD  Encounter Date: 05/26/2022  Occupational Therapy Parkinson's Disease Screen  Hand dominance:  right   Physical Performance Test item #4 (donning/doffing jacket):  14.41 sec  Fastening/unfastening 3 buttons in:  29.03sec  9-hole peg test:    RUE  25.44 sec        LUE  29.75 sec  Box & Blocks Test:   LUE  44 blocks  Change in ability to perform ADLs/IADLs:  no.  Writing with good legibility and size.    Other Comments:  RUE shoulder flexion 115* with -40 elbow extension.   LUE shoulder flexion 120* with -20* elbow extension  Pt does not require occupational therapy services at this time.  Recommended occupational therapy screen in months 6 months.  [facility], OTR/L 05/26/2022, 8:32 AM  [facility] [address] [place], [place], [address] Phone: [phone]   Fax:  [phone]

## 2022-05-27 NOTE — Progress Notes (Unsigned)
Assessment/Plan:   1.  Parkinsons Disease   -Increase carbidopa/levodopa 25/100, 2 tablets 3 times per day.  Some of this is b/c of convenience (his pharmacy isn't giving him scored tabs) and he is a bit slower)  -Continue carbidopa/levodopa 50/200 at bedtime  -Continue clonazepam 0.5 mg, half tablet at bedtime for cramping and sleep.  2.  MCI  -Last neurocognitive testing was in 2017 with Dr. Si Raider.  I have not seen any significant change in him cognitively since that time.  3.  Mild depression/insomnia  -Doing well with mirtazapine, 15 mg at bedtime  4.  B12 deficiency  -On oral supplementation  5.  Diplopia  -This was due to dislocated intraocular lens implant.  Patient following with ophthalmology and just had a new lens on the R and diplopia resolved.    6.  CLL  -Diagnosed September, 2022.  -Following with oncology.  7.  Basal cell carcinoma  -Following with dermatology.  8.  Weight loss  -As we have discussed previously, it is doubtful his weight loss is Parkinson's related.  His Parkinson's is far too mild to cause weight loss.  In addition, Parkinson's disease does not generally cause early satiety, unless there is significant constipation associated with it.  His dysphagia resolved with stretching esophagus, so that wasn't from Parkinsons Disease.  Would strongly encourage looking for another etiology.    -His weight has been fairly stable since October of last year.  He has had a fairly extensive work-up.             -he is already taking in extra protein             -on mirtazapine from me, which should, in theory, cause weight gain  9.  Ankle/feet paresthesias  -EMG negative for large fiber neuropathy.  -MRI lumbar spine with evidence of left S1 radiculopathy.  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  Patient without back pain.   Subjective:   Joshua Browning was seen today in follow up for  Parkinsons disease.  My previous records were reviewed prior to todays visit as well as outside records available to me.  Pt with wife who supplements hx. patient had an MRI lumbar spine done since last visit which demonstrated left S1 radiculopathy and multiple level facet arthropathy.  EMG demonstrated chronic multilevel radiculopathies affecting the L3-L5 nerve root segments bilaterally, overall moderate.  This was done primarily because of feet and ankle paresthesias.  Patient was not having any back pain.  There was no evidence of a large fiber neuropathy.  Saw oncology for early stage CLL recently.  He does state that ankle/feet paresthesias are improved.  He is [employer] class on Wednesdays and he is walking most days for exercise (about a mile).  He does some squats.  He had a lens replacement in the R eye.  He still has stitches in which is affecting night driving.  He will get those out potentially in December.    Current prescribed movement disorder medications: Carbidopa/levodopa 25/100, 1.5 tablet 3 times per day Carbidopa/levodopa 50/200 at bedtime Mirtazapine, 15 mg at bedtime Clonazepam 0.5 mg, half a tablet at bedtime  B12 supplement    ALLERGIES:  No Known Allergies  CURRENT MEDICATIONS:  Outpatient Encounter Medications as of 05/31/2022  Medication Sig   carbidopa-levodopa (SINEMET CR) 50-200 MG tablet TAKE 1 TABLET BY MOUTH EVERYDAY AT BEDTIME   carbidopa-levodopa (SINEMET IR) 25-100 MG tablet TAKE 1.5  TABLETS BY MOUTH 3 TIMES DAILY.   Cholecalciferol (VITAMIN D3) 1000 UNITS CAPS Take 1 capsule by mouth every evening.   clonazePAM (KLONOPIN) 0.5 MG tablet TAKE 1/2 TABLET BY MOUTH EVERY DAY AT BEDTIME   cyanocobalamin 1000 MCG tablet 1 tablet   famotidine (PEPCID) 20 MG tablet Take 20 mg by mouth at bedtime. Takes pepcid ac   fluorouracil (EFUDEX) 5 % cream Apply topically as needed.   Glucosamine Sulfate 1000 MG CAPS Take by mouth daily.    influenza vaccine adjuvanted  (FLUAD) 0.5 ML injection Inject into the muscle.   Magnesium 100 MG CAPS Take 1 capsule by mouth every evening.   mirtazapine (REMERON) 15 MG tablet TAKE 1 TABLET BY MOUTH EVERYDAY AT BEDTIME   Potassium 99 MG TABS Take by mouth daily.   No facility-administered encounter medications on file as of 05/31/2022.    Objective:   PHYSICAL EXAMINATION:    VITALS:   Vitals:   05/31/22 0909  BP: 119/79  Pulse: 78  SpO2: 98%  Weight: 142 lb 3.2 oz (64.5 kg)  Height: '5\' 10"'$  (1.778 m)   Wt Readings from Last 3 Encounters:  05/31/22 142 lb 3.2 oz (64.5 kg)  05/12/22 142 lb 3.2 oz (64.5 kg)  12/21/21 146 lb (66.2 kg)      GEN:  The patient appears stated age and is in NAD. HEENT:  Normocephalic, atraumatic.  The mucous membranes are moist.   Neurological examination:  Orientation: The patient is alert and oriented x3. Cranial nerves: There is good facial symmetry with minimal facial hypomimia. The speech is fluent and clear. Soft palate rises symmetrically and there is no tongue deviation. Hearing is intact to conversational tone. Sensation: Sensation is intact to light touch throughout.  Vibration is just slightly decreased distally.  No extinction with double simultaneous stimulation. Motor: Strength is at least antigravity x4.   Movement examination: Tone: There is nl tone in the bilateral UE Abnormal movements: no tremor today Coordination:  There is mild decremation on the L Gait and Station: The patient arises without using his hands.  He is forward flexed. Pt is slower than he was.    I have reviewed and interpreted the following labs independently    Chemistry      Component Value Date/Time   NA 142 05/13/2021 1512   K 4.6 05/13/2021 1512   CL 104 05/13/2021 1512   CO2 31 05/13/2021 1512   BUN 14 05/13/2021 1512   CREATININE 0.89 05/13/2021 1512   CREATININE 0.86 06/25/2019 1049      Component Value Date/Time   CALCIUM 9.6 05/13/2021 1512   ALKPHOS 45  05/13/2021 1512   AST 16 05/13/2021 1512   ALT 5 05/13/2021 1512   BILITOT 0.5 05/13/2021 1512       Lab Results  Component Value Date   WBC 7.2 05/12/2022   HGB 14.4 05/12/2022   HCT 43.4 05/12/2022   MCV 93.7 05/12/2022   PLT 147 (L) 05/12/2022    Lab Results  Component Value Date   TSH 1.49 06/25/2019   Lab Results  Component Value Date   VITAMINB12 269 06/25/2019     Total time spent on today's visit was 21 minutes, including both face-to-face time and nonface-to-face time.  Time included that spent on review of records (prior notes available to me/labs/imaging if pertinent), discussing treatment and goals, answering patient's questions and coordinating care.  Cc:  Lawerance Cruel, MD

## 2022-05-31 ENCOUNTER — Ambulatory Visit: Payer: Medicare Other | Admitting: Neurology

## 2022-05-31 ENCOUNTER — Encounter: Payer: Self-pay | Admitting: Neurology

## 2022-05-31 VITALS — BP 119/79 | HR 78 | Ht 70.0 in | Wt 142.2 lb

## 2022-05-31 DIAGNOSIS — G20A1 Parkinson's disease without dyskinesia, without mention of fluctuations: Secondary | ICD-10-CM | POA: Diagnosis not present

## 2022-05-31 DIAGNOSIS — G2581 Restless legs syndrome: Secondary | ICD-10-CM

## 2022-05-31 DIAGNOSIS — R251 Tremor, unspecified: Secondary | ICD-10-CM

## 2022-05-31 MED ORDER — CARBIDOPA-LEVODOPA 25-100 MG PO TABS: 2.0000 | ORAL_TABLET | Freq: Three times a day (TID) | ORAL | 1 refills | Status: DC

## 2022-05-31 NOTE — Patient Instructions (Addendum)
Increase carbidopa/levodopa 25/100, 2 tablets three times per day Continue carbidopa/levodopa 50/200 at bed

## 2022-06-06 DIAGNOSIS — L814 Other melanin hyperpigmentation: Secondary | ICD-10-CM | POA: Diagnosis not present

## 2022-06-06 DIAGNOSIS — L821 Other seborrheic keratosis: Secondary | ICD-10-CM | POA: Diagnosis not present

## 2022-06-06 DIAGNOSIS — L570 Actinic keratosis: Secondary | ICD-10-CM | POA: Diagnosis not present

## 2022-06-06 DIAGNOSIS — Z85828 Personal history of other malignant neoplasm of skin: Secondary | ICD-10-CM | POA: Diagnosis not present

## 2022-06-06 DIAGNOSIS — D225 Melanocytic nevi of trunk: Secondary | ICD-10-CM | POA: Diagnosis not present

## 2022-06-07 ENCOUNTER — Ambulatory Visit: Payer: Medicare Other | Admitting: Speech Pathology

## 2022-06-13 ENCOUNTER — Other Ambulatory Visit: Payer: Self-pay | Admitting: Neurology

## 2022-06-15 ENCOUNTER — Other Ambulatory Visit (HOSPITAL_BASED_OUTPATIENT_CLINIC_OR_DEPARTMENT_OTHER): Payer: Self-pay

## 2022-06-15 MED ORDER — COMIRNATY 30 MCG/0.3ML IM SUSY
PREFILLED_SYRINGE | INTRAMUSCULAR | 0 refills | Status: DC
  Filled 2022-06-15: qty 0.3, 1d supply, fill #0

## 2022-08-22 DIAGNOSIS — Z4802 Encounter for removal of sutures: Secondary | ICD-10-CM | POA: Diagnosis not present

## 2022-08-22 DIAGNOSIS — G43109 Migraine with aura, not intractable, without status migrainosus: Secondary | ICD-10-CM | POA: Diagnosis not present

## 2022-08-22 DIAGNOSIS — Z9841 Cataract extraction status, right eye: Secondary | ICD-10-CM | POA: Diagnosis not present

## 2022-08-22 DIAGNOSIS — H11001 Unspecified pterygium of right eye: Secondary | ICD-10-CM | POA: Diagnosis not present

## 2022-08-22 DIAGNOSIS — Z9889 Other specified postprocedural states: Secondary | ICD-10-CM | POA: Diagnosis not present

## 2022-08-22 DIAGNOSIS — Z961 Presence of intraocular lens: Secondary | ICD-10-CM | POA: Diagnosis not present

## 2022-08-26 ENCOUNTER — Telehealth: Payer: Self-pay | Admitting: Neurology

## 2022-08-26 DIAGNOSIS — Z9889 Other specified postprocedural states: Secondary | ICD-10-CM | POA: Diagnosis not present

## 2022-08-26 DIAGNOSIS — G43109 Migraine with aura, not intractable, without status migrainosus: Secondary | ICD-10-CM | POA: Diagnosis not present

## 2022-08-26 DIAGNOSIS — Z961 Presence of intraocular lens: Secondary | ICD-10-CM | POA: Diagnosis not present

## 2022-08-26 NOTE — Telephone Encounter (Signed)
Pt's wife called in stating the pt's balance is worse. He is having memory slip ups that have never happened before. She is worried about him.

## 2022-08-26 NOTE — Telephone Encounter (Signed)
Spoke to patients wife she is upset about the wait time to see Dr. Carles Collet or the Neuro testing. Patient being evaluated for PT

## 2022-08-29 NOTE — Telephone Encounter (Signed)
[facility] Address: [address], [place], [place] [address] Hours:  Open ? Closes 4:30?PM Phone: [phone]

## 2022-08-29 NOTE — Telephone Encounter (Signed)
Left voicemail at [facility] to see timeline and if Dr. Si Raider is accepting new patients. Patient is now on CX list for Dr. Carles Collet and I am waiting for a phone call back from [facility]

## 2022-08-30 ENCOUNTER — Other Ambulatory Visit: Payer: Self-pay

## 2022-08-30 DIAGNOSIS — G20A1 Parkinson's disease without dyskinesia, without mention of fluctuations: Secondary | ICD-10-CM

## 2022-08-30 DIAGNOSIS — R413 Other amnesia: Secondary | ICD-10-CM

## 2022-08-30 DIAGNOSIS — R350 Frequency of micturition: Secondary | ICD-10-CM | POA: Diagnosis not present

## 2022-08-30 NOTE — Telephone Encounter (Signed)
Spoke to [facility] neuro sending referral today

## 2022-09-08 NOTE — Progress Notes (Signed)
Assessment/Plan:   1.  Parkinsons Disease   -Continue carbidopa/levodopa 25/100, 2 tablets 3 times per day.    -Continue carbidopa/levodopa 50/200 at bedtime  -Continue clonazepam 0.5 mg, half tablet at bedtime for cramping and sleep.  2.  MCI with increasing memory change  -Last neurocognitive testing was in 2017 with Joshua Browning.  Repeat testing with Joshua Browning is pending.  Her office is [phone] but they will not process referrals until mid feb  -no driving  -do UA, W65, cbc, chem  -mri brain without  -discussed nature of visual distortions  3.  Mild depression/insomnia  -Doing well with mirtazapine, 15 mg at bedtime  4.  B12 deficiency  -On oral supplementation  -recheck level as has been few years  5.  Diplopia  -This was due to dislocated intraocular lens implant.  Patient following with ophthalmology and just had a new lens on the R and diplopia resolved.    6.  CLL  -Diagnosed September, 2022.  -Following with oncology.  7.  Basal cell carcinoma  -Following with dermatology.  8.  Weight loss  -Patient previously with extensive negative workup.              -he is already taking in extra protein             -on mirtazapine from me, which should, in theory, cause weight gain  9.  Ankle/feet paresthesias  -EMG negative for large fiber neuropathy.  -MRI lumbar spine with evidence of left S1 radiculopathy.  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  Patient without back pain.   Subjective:   Joshua Browning was seen today in follow up for Parkinsons disease.  My previous records were reviewed prior to todays visit as well as outside records available to me.  Pt with wife who supplements hx. slightly increased his levodopa last visit, somewhat for convenience as he was not getting scored tablets.  They called here complaining about increased memory change.  He has had neurocognitive testing in the  remote past with Joshua Browning.  We did send a referral to Joshua Browning for repeat testing.  Pt states that on 1/8 they were going to [place] (he is very familiar with the drive/area).  He had no problem getting to [place] but once he got there he missed a turn and got to a bridge that was out and that was concerning to him/wife.  He got to where he was going (was going to put flowers on cemetary plot) and then passed a restaurant they always go to.  Wife ended up driving home.  He has had similar incidents since then where he just drove by the destination he was going to.  He states that he knows all the turns while driving and then goes right by the destination.  No falls.  When watching TV at night, he has visual distortions but no other visual hallucinations.  The visual distortions are always on the L and is like an image that will move by and seem to disappear.  No new meds/OTC meds. Not walking as much as wife fx pelvis.     Current prescribed movement disorder medications: Carbidopa/levodopa 25/100, 2 tablets 3 times per day (increased) Carbidopa/levodopa 50/200 at bedtime Mirtazapine, 15 mg at bedtime Clonazepam 0.5 mg, half a tablet at bedtime  B12 supplement    ALLERGIES:  No Known Allergies  CURRENT MEDICATIONS:  Outpatient Encounter Medications as of 09/09/2022  Medication Sig   carbidopa-levodopa (SINEMET CR) 50-200 MG tablet TAKE 1 TABLET BY MOUTH EVERYDAY AT BEDTIME   carbidopa-levodopa (SINEMET IR) 25-100 MG tablet Take 2 tablets by mouth 3 (three) times daily.   Cholecalciferol (VITAMIN D3) 1000 UNITS CAPS Take 1 capsule by mouth every evening.   clonazePAM (KLONOPIN) 0.5 MG tablet TAKE 1/2 TABLET BY MOUTH EVERY DAY AT BEDTIME   COVID-19 mRNA vaccine 2023-2024 (COMIRNATY) syringe Inject into the muscle.   cyanocobalamin 1000 MCG tablet 1 tablet   famotidine (PEPCID) 20 MG tablet Take 20 mg by mouth at bedtime. Takes pepcid ac   fluorouracil (EFUDEX) 5 % cream Apply topically as needed.    Glucosamine Sulfate 1000 MG CAPS Take by mouth daily.    influenza vaccine adjuvanted (FLUAD) 0.5 ML injection Inject into the muscle.   Magnesium 100 MG CAPS Take 1 capsule by mouth every evening.   mirtazapine (REMERON) 15 MG tablet TAKE 1 TABLET BY MOUTH EVERYDAY AT BEDTIME   Potassium 99 MG TABS Take by mouth daily.   No facility-administered encounter medications on file as of 09/09/2022.    Objective:   PHYSICAL EXAMINATION:    VITALS:   Vitals:   09/09/22 0810  BP: 112/72  Pulse: 83  SpO2: 98%  Weight: 138 lb 6.4 oz (62.8 kg)  Height: '5\' 10"'$  (1.778 m)    Wt Readings from Last 3 Encounters:  09/09/22 138 lb 6.4 oz (62.8 kg)  05/31/22 142 lb 3.2 oz (64.5 kg)  05/12/22 142 lb 3.2 oz (64.5 kg)      GEN:  The patient appears stated age and is in NAD. HEENT:  Normocephalic, atraumatic.  The mucous membranes are moist.   Neurological examination:  Orientation: The patient is alert and oriented x3.  Supplies own hx without trouble Cranial nerves: There is good facial symmetry with minimal facial hypomimia. The speech is fluent and clear. Soft palate rises symmetrically and there is no tongue deviation. Hearing is intact to conversational tone. Sensation: Sensation is intact to light touch throughout.  Vibration is just slightly decreased distally.  No extinction with double simultaneous stimulation. Motor: Strength is at least antigravity x4.   Movement examination: Tone: There is nl tone in the bilateral UE Abnormal movements: no tremor today Coordination:  There is mild decremation on the L Gait and Station: The patient arises without using his hands.  He is forward flexed. Pt is unstable and dragging the R leg.    I have reviewed and interpreted the following labs independently    Chemistry      Component Value Date/Time   NA 142 05/13/2021 1512   K 4.6 05/13/2021 1512   CL 104 05/13/2021 1512   CO2 31 05/13/2021 1512   BUN 14 05/13/2021 1512   CREATININE  0.89 05/13/2021 1512   CREATININE 0.86 06/25/2019 1049      Component Value Date/Time   CALCIUM 9.6 05/13/2021 1512   ALKPHOS 45 05/13/2021 1512   AST 16 05/13/2021 1512   ALT 5 05/13/2021 1512   BILITOT 0.5 05/13/2021 1512       Lab Results  Component Value Date   WBC 7.2 05/12/2022   HGB 14.4 05/12/2022   HCT 43.4 05/12/2022   MCV 93.7 05/12/2022   PLT 147 (L) 05/12/2022    Lab Results  Component Value Date   TSH 1.49 06/25/2019   Lab Results  Component Value Date   VITAMINB12 269 06/25/2019     Total time spent on today's  visit was 30 minutes, including both face-to-face time and nonface-to-face time.  Time included that spent on review of records (prior notes available to me/labs/imaging if pertinent), discussing treatment and goals, answering patient's questions and coordinating care.  Cc:  Lawerance Cruel, MD

## 2022-09-09 ENCOUNTER — Ambulatory Visit: Payer: Medicare Other | Admitting: Neurology

## 2022-09-09 ENCOUNTER — Other Ambulatory Visit (INDEPENDENT_AMBULATORY_CARE_PROVIDER_SITE_OTHER): Payer: Medicare Other

## 2022-09-09 VITALS — BP 112/72 | HR 83 | Ht 70.0 in | Wt 138.4 lb

## 2022-09-09 DIAGNOSIS — G20A2 Parkinson's disease without dyskinesia, with fluctuations: Secondary | ICD-10-CM

## 2022-09-09 DIAGNOSIS — R4182 Altered mental status, unspecified: Secondary | ICD-10-CM

## 2022-09-09 DIAGNOSIS — E538 Deficiency of other specified B group vitamins: Secondary | ICD-10-CM | POA: Diagnosis not present

## 2022-09-09 DIAGNOSIS — Z5181 Encounter for therapeutic drug level monitoring: Secondary | ICD-10-CM

## 2022-09-09 DIAGNOSIS — R634 Abnormal weight loss: Secondary | ICD-10-CM

## 2022-09-09 LAB — CBC
HCT: 42.1 % (ref 39.0–52.0)
Hemoglobin: 14.2 g/dL (ref 13.0–17.0)
MCHC: 33.7 g/dL (ref 30.0–36.0)
MCV: 93.2 fl (ref 78.0–100.0)
Platelets: 156 10*3/uL (ref 150.0–400.0)
RBC: 4.52 Mil/uL (ref 4.22–5.81)
RDW: 14.5 % (ref 11.5–15.5)
WBC: 7.5 10*3/uL (ref 4.0–10.5)

## 2022-09-09 LAB — COMPREHENSIVE METABOLIC PANEL
ALT: 4 U/L (ref 0–53)
AST: 15 U/L (ref 0–37)
Albumin: 4.5 g/dL (ref 3.5–5.2)
Alkaline Phosphatase: 46 U/L (ref 39–117)
BUN: 14 mg/dL (ref 6–23)
CO2: 31 mEq/L (ref 19–32)
Calcium: 9.3 mg/dL (ref 8.4–10.5)
Chloride: 104 mEq/L (ref 96–112)
Creatinine, Ser: 0.87 mg/dL (ref 0.40–1.50)
GFR: 83.8 mL/min (ref >60.00)
Glucose, Bld: 88 mg/dL (ref 70–99)
Potassium: 4 mEq/L (ref 3.5–5.1)
Sodium: 143 mEq/L (ref 135–145)
Total Bilirubin: 0.6 mg/dL (ref 0.2–1.2)
Total Protein: 6.8 g/dL (ref 6.0–8.3)

## 2022-09-09 LAB — VITAMIN B12: Vitamin B-12: >1500 pg/mL — ABNORMAL HIGH (ref 211–911)

## 2022-09-09 NOTE — Patient Instructions (Addendum)
We sent a referral to Dr. Si Raider.  Her office is [phone] but they will not process referrals until mid feb  Your provider has requested that you have labwork completed today. The lab is located on the Second floor at [place], within the [facility] office. When you get off the elevator, turn right and go in the [facility] [address]; the first brown door on the left.  Tell the ladies behind the desk that you are there for lab work. If you are not called within 15 minutes please check with the front desk.   Once you complete your labs you are free to go. You will receive a call or message via MyChart with your lab results.    A referral to [facility] has been placed for your MRI someone will contact you directly to schedule your appt. They are located at [place]. Please contact them directly by calling [phone] with any questions regarding your referral.

## 2022-09-10 LAB — UA/M W/RFLX CULTURE, COMP
Bilirubin, UA: NEGATIVE
Glucose, UA: NEGATIVE
Ketones, UA: NEGATIVE
Leukocytes,UA: NEGATIVE
Nitrite, UA: NEGATIVE
Protein,UA: NEGATIVE
RBC, UA: NEGATIVE
Specific Gravity, UA: 1.012 (ref 1.005–1.030)
Urobilinogen, Ur: 0.2 mg/dL (ref 0.2–1.0)
pH, UA: 6.5 (ref 5.0–7.5)

## 2022-09-10 LAB — MICROSCOPIC EXAMINATION
Bacteria, UA: NONE SEEN
Casts: NONE SEEN /lpf
Epithelial Cells (non renal): NONE SEEN /hpf (ref 0–10)
WBC, UA: NONE SEEN /hpf (ref 0–5)

## 2022-09-12 ENCOUNTER — Other Ambulatory Visit: Payer: Self-pay | Admitting: Neurology

## 2022-09-12 DIAGNOSIS — G20A1 Parkinson's disease without dyskinesia, without mention of fluctuations: Secondary | ICD-10-CM

## 2022-09-17 ENCOUNTER — Ambulatory Visit
Admission: RE | Admit: 2022-09-17 | Discharge: 2022-09-17 | Disposition: A | Discharge status: Home / Self Care | Payer: Medicare Other | Source: Ambulatory Visit | Attending: Neurology | Admitting: Neurology

## 2022-09-17 DIAGNOSIS — R4182 Altered mental status, unspecified: Secondary | ICD-10-CM

## 2022-09-17 DIAGNOSIS — G20A2 Parkinson's disease without dyskinesia, with fluctuations: Secondary | ICD-10-CM

## 2022-09-17 DIAGNOSIS — I6782 Cerebral ischemia: Secondary | ICD-10-CM | POA: Diagnosis not present

## 2022-09-17 DIAGNOSIS — I6381 Other cerebral infarction due to occlusion or stenosis of small artery: Secondary | ICD-10-CM | POA: Diagnosis not present

## 2022-09-17 DIAGNOSIS — R590 Localized enlarged lymph nodes: Secondary | ICD-10-CM | POA: Diagnosis not present

## 2022-09-19 ENCOUNTER — Telehealth: Payer: Self-pay

## 2022-09-19 ENCOUNTER — Encounter: Payer: Self-pay | Admitting: Neurology

## 2022-09-19 NOTE — Telephone Encounter (Signed)
[facility] called with a call report for Pt Joshua Browning finds are: IMPRESSION: 1. Recent right occipital and posterior temporal cortex infarct. 2. Subacute lacunar infarct in the deep left cerebral white matter. 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 3. Chronic cervical adenopathy also seen on neck CT from 2022, question low-grade lymphoma such as CLL.  Results are in epic

## 2022-09-21 NOTE — Progress Notes (Unsigned)
Assessment/Plan:   1.  Parkinsons Disease   -Continue carbidopa/levodopa 25/100, 2 tablets 3 times per day.    -Continue carbidopa/levodopa 50/200 at bedtime  -Continue clonazepam 0.5 mg, half tablet at bedtime for cramping and sleep.  2.  MCI with increasing memory change  -Last neurocognitive testing was in 2017 with Dr. Si Raider.  Repeat testing with Dr. Si Raider is pending.  Her office is [phone] but they will not process referrals until mid feb   3.  Mild depression/insomnia  -Doing well with mirtazapine, 15 mg at bedtime  4.  B12 deficiency  -On oral supplementation  -recheck level as has been few years  5.  Diplopia  -This was due to dislocated intraocular lens implant.  Resolved after sx  6.  CLL  -Diagnosed September, 2022.  -Following with oncology.  7.  Basal cell carcinoma  -Following with dermatology.  8.  Weight loss  -Patient previously with extensive negative workup.              -he is already taking in extra protein  -not due to Parkinsons Disease  -weight loss continues             -on mirtazapine from me, which should, in theory, cause weight gain  9.  Ankle/feet paresthesias  -EMG negative for large fiber neuropathy.  -MRI lumbar spine with evidence of left S1 radiculopathy.  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  Patient without back pain.  10.  Abnormal brain scan, with infarcts that appear somewhat embolic (different sides of the brain)  -Films personally reviewed with patient and wife.  -ASA, 81 mg and plavix x 21 days and then ASA alone.  Discussed risk, benefits, and side effects, including but not limited to GI bleed.  -Check echo with bubble  -Check carotid ultrasound  -Refer to cardiology for consideration for zio patch.  We will send [facility]  -Talked about importance of blood pressure control with a goal <130/80 mm Hg.   -Talked about importance of lipid  control and proper diet.  Lipids should be managed intensively, with a goal LDL < 70 mg/dL.  Last LDL was pretty good at 85, but this was last done in 2022.  We will get fasting labs when he is fasting.    -discussed importance of following up with eye doctor to make sure okay to drive given occipital location.    Subjective:   Joshua Browning was seen today in follow up for Parkinsons disease.  My previous records were reviewed prior to todays visit as well as outside records available to me.  Pt with wife who supplements hx. patient had MRI brain February 3.  I personally reviewed it.  There was restricted diffusion in the right occipital/temporal cortex, that was T1 hyperintensity without gradient hypointensity, suggesting that this could be nonacute.  There was subacute lacunar infarction in the deep left cerebral white matter.  Looking back, they state that the day that the "first event" occurred after he had his stitches taken out of his eyes for cataract surgery that was actually done in August.  He states that they were waiting to take out the stitches until the pressure in his eyes went down.  He is not sure if this is related.  He does state that since I saw him last visit, the visual distortions have gone away.   Current prescribed movement disorder medications: Carbidopa/levodopa 25/100, 2 tablets 3 times per day (  increased) Carbidopa/levodopa 50/200 at bedtime Mirtazapine, 15 mg at bedtime Clonazepam 0.5 mg, half a tablet at bedtime  B12 supplement    ALLERGIES:  No Known Allergies  CURRENT MEDICATIONS:  Outpatient Encounter Medications as of 09/22/2022  Medication Sig   carbidopa-levodopa (SINEMET CR) 50-200 MG tablet TAKE 1 TABLET BY MOUTH EVERYDAY AT BEDTIME   carbidopa-levodopa (SINEMET IR) 25-100 MG tablet Take 2 tablets by mouth 3 (three) times daily.   Cholecalciferol (VITAMIN D3) 1000 UNITS CAPS Take 1 capsule by mouth every evening.   clonazePAM (KLONOPIN) 0.5 MG tablet  TAKE 1/2 TABLET BY MOUTH EVERY DAY AT BEDTIME   COVID-19 mRNA vaccine 2023-2024 (COMIRNATY) syringe Inject into the muscle.   cyanocobalamin 1000 MCG tablet 1 tablet   famotidine (PEPCID) 20 MG tablet Take 20 mg by mouth at bedtime. Takes pepcid ac   fluorouracil (EFUDEX) 5 % cream Apply topically as needed.   Glucosamine Sulfate 1000 MG CAPS Take by mouth daily.    influenza vaccine adjuvanted (FLUAD) 0.5 ML injection Inject into the muscle.   Magnesium 100 MG CAPS Take 1 capsule by mouth every evening.   mirtazapine (REMERON) 15 MG tablet TAKE 1 TABLET BY MOUTH EVERYDAY AT BEDTIME   Potassium 99 MG TABS Take by mouth daily.   No facility-administered encounter medications on file as of 09/22/2022.    Objective:   PHYSICAL EXAMINATION:    VITALS:   Vitals:   09/22/22 1035  BP: 116/80  Pulse: 71  SpO2: 97%  Weight: 135 lb (61.2 kg)  Height: '5\' 10"'$  (1.778 m)     Wt Readings from Last 3 Encounters:  09/22/22 135 lb (61.2 kg)  09/09/22 138 lb 6.4 oz (62.8 kg)  05/31/22 142 lb 3.2 oz (64.5 kg)   Virtually 100% of visit in counseling  I have reviewed and interpreted the following labs independently    Chemistry      Component Value Date/Time   NA 143 09/09/2022 0900   K 4.0 09/09/2022 0900   CL 104 09/09/2022 0900   CO2 31 09/09/2022 0900   BUN 14 09/09/2022 0900   CREATININE 0.87 09/09/2022 0900   CREATININE 0.89 05/13/2021 1512   CREATININE 0.86 06/25/2019 1049      Component Value Date/Time   CALCIUM 9.3 09/09/2022 0900   ALKPHOS 46 09/09/2022 0900   AST 15 09/09/2022 0900   AST 16 05/13/2021 1512   ALT 4 09/09/2022 0900   ALT 5 05/13/2021 1512   BILITOT 0.6 09/09/2022 0900   BILITOT 0.5 05/13/2021 1512       Lab Results  Component Value Date   WBC 7.5 09/09/2022   HGB 14.2 09/09/2022   HCT 42.1 09/09/2022   MCV 93.2 09/09/2022   PLT 156.0 09/09/2022    Lab Results  Component Value Date   TSH 1.49 06/25/2019   Lab Results  Component Value Date    VITAMINB12 >1500 (H) 09/09/2022   Hemoglobin A1c done June, 2023 was 5.5.  We called primary care physician and they sent all labs available.  Last lipid panel was done June, 2022 and his LDL was 85.   Cc:  Lawerance Cruel, MD

## 2022-09-22 ENCOUNTER — Encounter: Payer: Self-pay | Admitting: Neurology

## 2022-09-22 ENCOUNTER — Ambulatory Visit: Payer: Medicare Other | Admitting: Neurology

## 2022-09-22 VITALS — BP 116/80 | HR 71 | Ht 70.0 in | Wt 135.0 lb

## 2022-09-22 DIAGNOSIS — I6310 Cerebral infarction due to embolism of unspecified precerebral artery: Secondary | ICD-10-CM

## 2022-09-22 DIAGNOSIS — R634 Abnormal weight loss: Secondary | ICD-10-CM | POA: Diagnosis not present

## 2022-09-22 MED ORDER — CLOPIDOGREL BISULFATE 75 MG PO TABS: 75.0000 mg | ORAL_TABLET | Freq: Every day | ORAL | 0 refills | Status: DC

## 2022-09-22 NOTE — Patient Instructions (Addendum)
We will get you scheduled for carotid ultrasound and echo We will refer you to [facility] cardiology for consideration for zio patch Take aspirin '81mg'$  coated WITH plavix 75 mg, daily for 21 days and then STOP the plavix and take aspirin alone See if your eye doctor can do a visual field test on you to make sure it is safe to drive since the stroke affected the vision center. You need fasting lipids.  Call when you are ready and we will check you into the lab   The physicians and staff at [facility] are committed to providing excellent care. You may receive a survey requesting feedback about your experience at our office. We strive to receive "very good" responses to the survey questions. If you feel that your experience would prevent you from giving the office a "very good " response, please contact our office to try to remedy the situation. We may be reached at [phone]. Thank you for taking the time out of your busy day to complete the survey.

## 2022-09-26 ENCOUNTER — Other Ambulatory Visit (INDEPENDENT_AMBULATORY_CARE_PROVIDER_SITE_OTHER): Payer: Medicare Other

## 2022-09-26 DIAGNOSIS — I6310 Cerebral infarction due to embolism of unspecified precerebral artery: Secondary | ICD-10-CM | POA: Diagnosis not present

## 2022-09-26 LAB — LIPID PANEL
Cholesterol: 164 mg/dL (ref 0–200)
HDL: 45.9 mg/dL (ref >39.00)
LDL Cholesterol: 104 mg/dL — ABNORMAL HIGH (ref 0–99)
NonHDL: 117.85
Total CHOL/HDL Ratio: 4
Triglycerides: 70 mg/dL (ref 0.0–149.0)
VLDL: 14 mg/dL (ref 0.0–40.0)

## 2022-09-26 MED ORDER — ATORVASTATIN CALCIUM 20 MG PO TABS: 20.0000 mg | ORAL_TABLET | Freq: Every day | ORAL | 1 refills | Status: AC

## 2022-09-27 ENCOUNTER — Other Ambulatory Visit: Payer: Medicare Other

## 2022-09-27 ENCOUNTER — Telehealth: Payer: Self-pay

## 2022-09-27 NOTE — Telephone Encounter (Signed)
Received notice today requiring clinicals for the PA to approve the Echo with Bubble

## 2022-09-28 ENCOUNTER — Telehealth: Payer: Self-pay | Admitting: Anesthesiology

## 2022-09-28 ENCOUNTER — Encounter: Payer: Self-pay | Admitting: Cardiology

## 2022-09-28 ENCOUNTER — Ambulatory Visit: Payer: Medicare Other | Admitting: Cardiology

## 2022-09-28 ENCOUNTER — Ambulatory Visit
Admission: RE | Admit: 2022-09-28 | Discharge: 2022-09-28 | Disposition: A | Discharge status: Home / Self Care | Payer: Medicare Other | Source: Ambulatory Visit | Attending: Neurology | Admitting: Neurology

## 2022-09-28 VITALS — BP 134/80 | HR 82 | Resp 16 | Ht 70.0 in | Wt 132.0 lb

## 2022-09-28 DIAGNOSIS — I6310 Cerebral infarction due to embolism of unspecified precerebral artery: Secondary | ICD-10-CM

## 2022-09-28 DIAGNOSIS — R0609 Other forms of dyspnea: Secondary | ICD-10-CM

## 2022-09-28 DIAGNOSIS — Z961 Presence of intraocular lens: Secondary | ICD-10-CM | POA: Diagnosis not present

## 2022-09-28 DIAGNOSIS — Z9889 Other specified postprocedural states: Secondary | ICD-10-CM | POA: Diagnosis not present

## 2022-09-28 DIAGNOSIS — I639 Cerebral infarction, unspecified: Secondary | ICD-10-CM

## 2022-09-28 DIAGNOSIS — G43109 Migraine with aura, not intractable, without status migrainosus: Secondary | ICD-10-CM | POA: Diagnosis not present

## 2022-09-28 DIAGNOSIS — I6523 Occlusion and stenosis of bilateral carotid arteries: Secondary | ICD-10-CM | POA: Diagnosis not present

## 2022-09-28 NOTE — Telephone Encounter (Signed)
Pt is returning a call. States [place] had some information for her.

## 2022-09-28 NOTE — Progress Notes (Signed)
Patient referred by Tat, Joshua Quail, DO for embolic stroke  Subjective:   Joshua Browning, male    DOB: Oct 17, 1945, 77 y.o.   MRN: [id number]   Chief Complaint  Patient presents with   Cerebrovascular Accident   New Patient (Initial Visit)     HPI  77 y.o. 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  He is retired, activities limited to working around [employer].  He sees Dr. Carles Collet for Parkinson's disease.  He has had some recent memory changes.  In addition, recent MRI was suspicious for embolic stroke.  Separately, he reports exertional dyspnea with minimal walking which is worse over the last 1 year.  He denies any chest pain.  He is also lost 50 pounds unintentionally over the past 2 years.  While he has CLL, it is felt that this is not causing his weight loss, neither his Parkinson's disease.  Workup thus far has been fairly unyielding for specific etiology for weight loss.  He does eat 3 meals, and snacks including protein bars in between, and has not had any appetite loss.   Past Medical History:  Diagnosis Date   BPH (benign prostatic hyperplasia)    Foley catheter in place    removed after turp   GERD (gastroesophageal reflux disease)    Nocturnal leg cramps    occ   Parkinson's disease    Urinary retention    Wears glasses      Past Surgical History:  Procedure Laterality Date   CATARACT EXTRACTION W/ INTRAOCULAR LENS  IMPLANT, BILATERAL Bilateral    INGUINAL HERNIA REPAIR Bilateral RIGHT x 2 [id number] umbilical done with last right   left last one done 10 yrs ago   RE-DO RIGHT INGUINAL HERNIA REPAIR AND UMBILICAL HERNIA REPAIR  2001   REMOVAL LEFT EYE INTRAOCULAR LENS AND REPLACEMENT   08-29-2013   TRANSURETHRAL RESECTION OF PROSTATE N/A 09/23/2013   Procedure: TRANSURETHRAL RESECTION OF THE PROSTATE (TURP) WITH GYRUS;  Surgeon: Claybon Jabs,  MD;  Location: [facility];  Service: Urology;  Laterality: N/A;   VARICOCELECTOMY Left 07/02/2018   Procedure: VARICOCELECTOMY;  Surgeon: Kathie Rhodes, MD;  Location: [facility];  Service: Urology;  Laterality: Left;     Social History   Tobacco Use  Smoking Status Never  Smokeless Tobacco Never    Social History   Substance and Sexual Activity  Alcohol Use No   Alcohol/week: 0.0 standard drinks of alcohol     Family History  Problem Relation Age of Onset   Congestive Heart Failure Mother    Stroke Mother    Heart attack Father 36       died at 60      Current Outpatient Medications:    atorvastatin (LIPITOR) 20 MG tablet, Take 1 tablet (20 mg total) by mouth daily., Disp: 90 tablet, Rfl: 1   carbidopa-levodopa (SINEMET CR) 50-200 MG tablet, TAKE 1 TABLET BY MOUTH EVERYDAY AT BEDTIME, Disp: 90 tablet, Rfl: 0   carbidopa-levodopa (SINEMET IR) 25-100 MG tablet, Take 2 tablets by mouth 3 (three) times daily., Disp: 540 tablet, Rfl: 1   Cholecalciferol (VITAMIN D3) 1000 UNITS CAPS, Take 1 capsule by mouth every evening., Disp: , Rfl:    clonazePAM (KLONOPIN) 0.5 MG tablet, TAKE 1/2 TABLET BY MOUTH EVERY DAY AT BEDTIME, Disp: 45 tablet, Rfl: 1   clopidogrel (PLAVIX) 75 MG  tablet, Take 1 tablet (75 mg total) by mouth daily., Disp: 21 tablet, Rfl: 0   cyanocobalamin 1000 MCG tablet, Take 1,000 mcg by mouth daily., Disp: , Rfl:    famotidine (PEPCID) 20 MG tablet, Take 20 mg by mouth at bedtime. Takes pepcid ac, Disp: , Rfl:    Glucosamine Sulfate 1000 MG CAPS, Take by mouth daily. , Disp: , Rfl:    Magnesium 100 MG CAPS, Take 1 capsule by mouth every evening., Disp: , Rfl:    mirtazapine (REMERON) 15 MG tablet, TAKE 1 TABLET BY MOUTH EVERYDAY AT BEDTIME, Disp: 90 tablet, Rfl: 0   Potassium 99 MG TABS, Take by mouth daily., Disp: , Rfl:    COVID-19 mRNA vaccine 2023-2024 (COMIRNATY) syringe, Inject into the muscle., Disp: 0.3 mL, Rfl: 0    influenza vaccine adjuvanted (FLUAD) 0.5 ML injection, Inject into the muscle., Disp: 0.5 mL, Rfl: 0   Cardiovascular and other pertinent studies:  Reviewed external labs and tests, independently interpreted  EKG 09/28/2022: Sinus rhythm 74 bpm IVCD Old anteroseptal infarct  MRI brain 09/17/2022: 1. Recent right occipital and posterior temporal cortex infarct. 2. Subacute lacunar infarct in the deep left cerebral white matter. 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 3. Chronic cervical adenopathy also seen on neck CT from 2022, question low-grade lymphoma such as CLL.     Recent labs: Jan-Feb 2024: Glucose 88, BUN/Cr 14/0.87. EGFR 83. Na/K 143/4.0. Rest of the CMP normal H/H 14/42. MCV 93. Platelets 156 Chol 164, TG 70, HDL 45, LDL 104 TSH 1.4 normal   Review of Systems  Constitutional: Positive for weight loss.  Cardiovascular:  Positive for dyspnea on exertion. Negative for chest pain, leg swelling, palpitations and syncope.         Vitals:   09/28/22 0840  BP: 134/80  Pulse: 82  Resp: 16  SpO2: 95%     Body mass index is 18.94 kg/m. Filed Weights   09/28/22 0840  Weight: 132 lb (59.9 kg)     Objective:   Physical Exam Vitals and nursing note reviewed.  Constitutional:      General: He is not in acute distress.    Appearance: He is underweight.  Neck:     Vascular: No JVD.  Cardiovascular:     Rate and Rhythm: Normal rate and regular rhythm.     Heart sounds: Normal heart sounds. No murmur heard. Pulmonary:     Effort: Pulmonary effort is normal.     Breath sounds: Normal breath sounds. No wheezing or rales.  Musculoskeletal:     Right lower leg: No edema.     Left lower leg: No edema.          Visit diagnoses:   ICD-10-CM   1. Cerebrovascular accident (CVA) due to embolism of precerebral artery (HCC)  I63.10 EKG 12-Lead       Orders Placed This Encounter  Procedures   PCV MYOCARDIAL PERFUSION WITH LEXISCAN   LONG TERM MONITOR  (3-14 DAYS)   EKG 12-Lead   PCV ECHOCARDIOGRAM COMPLETE W BUBBLE      Assessment & Recommendations:   77 y.o. 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 Recommend to be cardiac telemetry to look for A-fib.  I also encouraged him to get an Apple Watch for longitudinal screening.  If cardiac telemetry with Zio patch is unyielding for presence of A-fib, could consider loop recorder in the future. Also recommend echocardiogram  with bubble study, although suspicion for PFO is relatively low in the 77 year old gentleman with other risk factors. He is already scheduled to undergo carotid ultrasound with [facility] imaging today. Agree with high intensity statin.  Exertional dyspnea: No heart failure on exam.  No specific etiology identified. Will obtain Lexiscan nuclear stress testing.  Unintentional weight loss: Etiology unclear.  CLL, and Parkinson's not felt to be because of his weight loss.  Continue follow-up with PCP Dr. Harrington Challenger.  Further recommendations after above testing.  Thank you for referring the patient to [place]. Please feel free to contact with any questions.   Nigel Mormon, MD Pager: [phone] Office: [phone]

## 2022-09-29 ENCOUNTER — Telehealth: Payer: Self-pay

## 2022-09-29 NOTE — Telephone Encounter (Signed)
Called patient to give test results on The carotid Ultrasound and update patient on the PA for the Echo Dr. Carles Collet wanted to have done. Spoke to patients wife and they have been to cardiology since last visit. Cardiology is doing an Echo tomorrow in there office so we will not need to schedule this appointment. I was still waiting on PA for this procedure. Patient will have a heart monitor put on March 4th and they are preforming a stress test

## 2022-09-29 NOTE — Telephone Encounter (Signed)
Called patient and discussed further testing

## 2022-09-30 ENCOUNTER — Ambulatory Visit: Payer: Medicare Other

## 2022-09-30 DIAGNOSIS — I639 Cerebral infarction, unspecified: Secondary | ICD-10-CM

## 2022-09-30 DIAGNOSIS — I6310 Cerebral infarction due to embolism of unspecified precerebral artery: Secondary | ICD-10-CM

## 2022-09-30 DIAGNOSIS — R0609 Other forms of dyspnea: Secondary | ICD-10-CM | POA: Diagnosis not present

## 2022-10-03 DIAGNOSIS — Z8673 Personal history of transient ischemic attack (TIA), and cerebral infarction without residual deficits: Secondary | ICD-10-CM | POA: Diagnosis not present

## 2022-10-03 DIAGNOSIS — I639 Cerebral infarction, unspecified: Secondary | ICD-10-CM | POA: Diagnosis not present

## 2022-10-04 ENCOUNTER — Other Ambulatory Visit: Payer: Self-pay | Admitting: Neurology

## 2022-10-17 ENCOUNTER — Ambulatory Visit: Payer: Medicare Other

## 2022-10-17 DIAGNOSIS — R0609 Other forms of dyspnea: Secondary | ICD-10-CM

## 2022-10-17 LAB — PCV MYOCARDIAL PERFUSION WITH LEXISCAN: ST Depression (mm): 0 mm

## 2022-10-24 DIAGNOSIS — R0609 Other forms of dyspnea: Secondary | ICD-10-CM | POA: Diagnosis not present

## 2022-10-24 DIAGNOSIS — I639 Cerebral infarction, unspecified: Secondary | ICD-10-CM | POA: Diagnosis not present

## 2022-10-26 ENCOUNTER — Encounter: Payer: Self-pay | Admitting: Cardiology

## 2022-10-26 ENCOUNTER — Ambulatory Visit: Payer: Medicare Other | Admitting: Cardiology

## 2022-10-26 VITALS — BP 120/71 | HR 72 | Ht 70.0 in | Wt 141.6 lb

## 2022-10-26 DIAGNOSIS — I639 Cerebral infarction, unspecified: Secondary | ICD-10-CM | POA: Diagnosis not present

## 2022-10-26 DIAGNOSIS — I6310 Cerebral infarction due to embolism of unspecified precerebral artery: Secondary | ICD-10-CM | POA: Diagnosis not present

## 2022-10-26 DIAGNOSIS — R0609 Other forms of dyspnea: Secondary | ICD-10-CM | POA: Diagnosis not present

## 2022-10-26 DIAGNOSIS — Z8673 Personal history of transient ischemic attack (TIA), and cerebral infarction without residual deficits: Secondary | ICD-10-CM

## 2022-10-26 DIAGNOSIS — R338 Other retention of urine: Secondary | ICD-10-CM | POA: Diagnosis not present

## 2022-10-26 NOTE — Progress Notes (Unsigned)
Patient referred by Lawerance Cruel, MD for embolic stroke  Subjective:   Joshua Browning, male    DOB: 07-27-1946, 77 y.o.   MRN: [id number]   No chief complaint on file.    HPI  77 y.o. 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  He is retired, activities limited to working around [employer].  He sees Dr. Carles Collet for Parkinson's disease.  He has had some recent memory changes.  In addition, recent MRI was suspicious for embolic stroke.  Separately, he reports exertional dyspnea with minimal walking which is worse over the last 1 year.  He denies any chest pain.  He is also lost 50 pounds unintentionally over the past 2 years.  While he has CLL, it is felt that this is not causing his weight loss, neither his Parkinson's disease.  Workup thus far has been fairly unyielding for specific etiology for weight loss.  He does eat 3 meals, and snacks including protein bars in between, and has not had any appetite loss.     Current Outpatient Medications:    atorvastatin (LIPITOR) 20 MG tablet, Take 1 tablet (20 mg total) by mouth daily., Disp: 90 tablet, Rfl: 1   carbidopa-levodopa (SINEMET CR) 50-200 MG tablet, TAKE 1 TABLET BY MOUTH EVERYDAY AT BEDTIME, Disp: 90 tablet, Rfl: 0   carbidopa-levodopa (SINEMET IR) 25-100 MG tablet, Take 2 tablets by mouth 3 (three) times daily., Disp: 540 tablet, Rfl: 1   Cholecalciferol (VITAMIN D3) 1000 UNITS CAPS, Take 1 capsule by mouth every evening., Disp: , Rfl:    clonazePAM (KLONOPIN) 0.5 MG tablet, TAKE 1/2 TABLET BY MOUTH EVERY DAY AT BEDTIME, Disp: 45 tablet, Rfl: 1   clopidogrel (PLAVIX) 75 MG tablet, Take 1 tablet (75 mg total) by mouth daily., Disp: 21 tablet, Rfl: 0   COVID-19 mRNA vaccine 2023-2024 (COMIRNATY) syringe, Inject into the muscle., Disp: 0.3 mL, Rfl: 0   cyanocobalamin 1000 MCG tablet, Take 1,000 mcg by mouth  daily., Disp: , Rfl:    famotidine (PEPCID) 20 MG tablet, Take 20 mg by mouth at bedtime. Takes pepcid ac, Disp: , Rfl:    Glucosamine Sulfate 1000 MG CAPS, Take by mouth daily. , Disp: , Rfl:    influenza vaccine adjuvanted (FLUAD) 0.5 ML injection, Inject into the muscle., Disp: 0.5 mL, Rfl: 0   Magnesium 100 MG CAPS, Take 1 capsule by mouth every evening., Disp: , Rfl:    mirtazapine (REMERON) 15 MG tablet, TAKE 1 TABLET BY MOUTH EVERYDAY AT BEDTIME, Disp: 90 tablet, Rfl: 0   Potassium 99 MG TABS, Take by mouth daily., Disp: , Rfl:    Cardiovascular and other pertinent studies:  Reviewed external labs and tests, independently interpreted  EKG 10/26/2022: Sinus rhythm 94 bpm Right axis deviation Left atrial enlargement. Poor R-wave progression Possible old anterior infarct Nonspecific T wave abnormality  Mobile cardiac telemetry 13 days 09/30/2022 - 10/14/2022: Dominant rhythm: Sinus. HR 45-134 bpm. Avg HR 69 bpm. 18 episodes of probable atrial tachycardia, fastest at 194 bpm for 8 beats, longest for 34.6 secs at 124 bpm. <1% isolated SVE, couplet/triplets. 0 episodes of VT. <1% isolated VE, couplet/triplets. No atrial fibrillation/atrial flutter/VT/high grade AV block, sinus pause >3sec noted. 4 patient triggered events correlated with sinus rhythm, occasional SVE.  Regadenoson Nuclear stress test  10/17/2022: There is a fixed mild defect in the inferior and apical regions consistent with  diaphragmatic attenuation. No ischemia or scar. .  Overall LV systolic function is normal without regional wall motion abnormalities. Stress LV EF: 62%.  Non-diagnostic ECG stress. The heart rate response was consistent with Regadenoson.  No previous exam available for comparison. Low risk.   Echocardiogram 09/30/2022: Normal LV systolic function with visual EF 55-60%. Left ventricle cavity is normal in size. Normal left ventricular wall thickness. Normal global wall motion. Normal diastolic  filling pattern. Calculated EF 60%. Negative bubble study. Trace mitral regurgitation. Mild mitral valve leaflet calcification. Structurally normal tricuspid valve.  Mild tricuspid regurgitation. No evidence of pulmonary hypertension. IVC is dilated with respiratory variation. No prior available for comparison.  Carotid duplex 09/28/2022: Color duplex indicates minimal homogeneous plaque, with no hemodynamically significant stenosis by duplex criteria in the extracranial cerebrovascular circulation.    EKG 09/28/2022: Sinus rhythm 74 bpm IVCD Old anteroseptal infarct  MRI brain 09/17/2022: 1. Recent right occipital and posterior temporal cortex infarct. 2. Subacute lacunar infarct in the deep left cerebral white matter. 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 3. Chronic cervical adenopathy also seen on neck CT from 2022, question low-grade lymphoma such as CLL.     Recent labs: Jan-Feb 2024: Glucose 88, BUN/Cr 14/0.87. EGFR 83. Na/K 143/4.0. Rest of the CMP normal H/H 14/42. MCV 93. Platelets 156 Chol 164, TG 70, HDL 45, LDL 104 TSH 1.4 normal   Review of Systems  Constitutional: Positive for weight loss.  Cardiovascular:  Positive for dyspnea on exertion. Negative for chest pain, leg swelling, palpitations and syncope.         Vitals:   10/26/22 1417  BP: 120/71  Pulse: 72     Body mass index is 20.32 kg/m. Filed Weights   10/26/22 1417  Weight: 141 lb 9.6 oz (64.2 kg)     Objective:   Physical Exam Vitals and nursing note reviewed.  Constitutional:      General: He is not in acute distress.    Appearance: He is underweight.  Neck:     Vascular: No JVD.  Cardiovascular:     Rate and Rhythm: Normal rate and regular rhythm.     Heart sounds: Normal heart sounds. No murmur heard. Pulmonary:     Effort: Pulmonary effort is normal.     Breath sounds: Normal breath sounds. No wheezing or rales.  Musculoskeletal:     Right lower leg: No edema.      Left lower leg: No edema.          Visit diagnoses: No diagnosis found.    No orders of the defined types were placed in this encounter.     Assessment & Recommendations:   77 y.o. 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 Recommend to be cardiac telemetry to look for A-fib.  I also encouraged him to get an Apple Watch for longitudinal screening.  If cardiac telemetry with Zio patch is unyielding for presence of A-fib, could consider loop recorder in the future. Also recommend echocardiogram with bubble study, although suspicion for PFO is relatively low in the 77 year old gentleman with other risk factors. He is already scheduled to undergo carotid ultrasound with [facility] imaging today. Agree with high intensity statin.  Exertional dyspnea: No heart failure on exam.  No specific etiology identified. Will obtain Lexiscan nuclear stress testing.  Unintentional weight loss: Etiology unclear.  CLL, and Parkinson's not felt to be because of his weight loss.  Continue  follow-up with PCP Dr. Harrington Challenger.  Further recommendations after above testing.  Thank you for referring the patient to [place]. Please feel free to contact with any questions.   Nigel Mormon, MD Pager: [phone] Office: [phone]

## 2022-10-27 ENCOUNTER — Encounter: Payer: Self-pay | Admitting: Cardiology

## 2022-10-27 DIAGNOSIS — I679 Cerebrovascular disease, unspecified: Secondary | ICD-10-CM | POA: Diagnosis not present

## 2022-10-27 DIAGNOSIS — G20A1 Parkinson's disease without dyskinesia, without mention of fluctuations: Secondary | ICD-10-CM | POA: Diagnosis not present

## 2022-10-27 DIAGNOSIS — Z8673 Personal history of transient ischemic attack (TIA), and cerebral infarction without residual deficits: Secondary | ICD-10-CM | POA: Insufficient documentation

## 2022-10-27 DIAGNOSIS — F0670 Mild neurocognitive disorder due to known physiological condition without behavioral disturbance: Secondary | ICD-10-CM | POA: Diagnosis not present

## 2022-10-31 ENCOUNTER — Encounter: Payer: Self-pay | Admitting: Neurology

## 2022-10-31 NOTE — Telephone Encounter (Signed)
Called Dr. Joretta Bachelor office and left message on what you needed

## 2022-11-08 ENCOUNTER — Inpatient Hospital Stay: Payer: Medicare Other

## 2022-11-08 ENCOUNTER — Other Ambulatory Visit: Payer: Self-pay

## 2022-11-08 ENCOUNTER — Inpatient Hospital Stay: Payer: Medicare Other | Admitting: Oncology

## 2022-11-08 DIAGNOSIS — G20A2 Parkinson's disease without dyskinesia, with fluctuations: Secondary | ICD-10-CM

## 2022-11-11 ENCOUNTER — Other Ambulatory Visit: Payer: Self-pay | Admitting: Neurology

## 2022-11-11 DIAGNOSIS — R251 Tremor, unspecified: Secondary | ICD-10-CM

## 2022-11-11 DIAGNOSIS — G20A1 Parkinson's disease without dyskinesia, without mention of fluctuations: Secondary | ICD-10-CM

## 2022-11-11 DIAGNOSIS — G2581 Restless legs syndrome: Secondary | ICD-10-CM

## 2022-11-17 ENCOUNTER — Ambulatory Visit: Payer: Medicare Other | Admitting: Neurology

## 2022-11-30 DIAGNOSIS — I69398 Other sequelae of cerebral infarction: Secondary | ICD-10-CM | POA: Diagnosis not present

## 2022-11-30 DIAGNOSIS — H59022 Cataract (lens) fragments in eye following cataract surgery, left eye: Secondary | ICD-10-CM | POA: Diagnosis not present

## 2022-11-30 DIAGNOSIS — H53462 Homonymous bilateral field defects, left side: Secondary | ICD-10-CM | POA: Diagnosis not present

## 2022-11-30 DIAGNOSIS — G43109 Migraine with aura, not intractable, without status migrainosus: Secondary | ICD-10-CM | POA: Diagnosis not present

## 2022-12-06 ENCOUNTER — Inpatient Hospital Stay: Payer: Medicare Other | Admitting: Oncology

## 2022-12-06 ENCOUNTER — Other Ambulatory Visit (HOSPITAL_BASED_OUTPATIENT_CLINIC_OR_DEPARTMENT_OTHER): Payer: Self-pay

## 2022-12-06 ENCOUNTER — Inpatient Hospital Stay: Payer: Medicare Other | Attending: Oncology

## 2022-12-06 VITALS — BP 142/69 | HR 63 | Temp 98.1°F | Resp 18 | Ht 70.0 in | Wt 141.7 lb

## 2022-12-06 DIAGNOSIS — G20A1 Parkinson's disease without dyskinesia, without mention of fluctuations: Secondary | ICD-10-CM | POA: Diagnosis not present

## 2022-12-06 DIAGNOSIS — C9110 Chronic lymphocytic leukemia of B-cell type not having achieved remission: Secondary | ICD-10-CM | POA: Diagnosis not present

## 2022-12-06 DIAGNOSIS — I6381 Other cerebral infarction due to occlusion or stenosis of small artery: Secondary | ICD-10-CM | POA: Diagnosis not present

## 2022-12-06 DIAGNOSIS — R1310 Dysphagia, unspecified: Secondary | ICD-10-CM | POA: Diagnosis not present

## 2022-12-06 LAB — CBC WITH DIFFERENTIAL (CANCER CENTER ONLY)
Abs Immature Granulocytes: 0.01 10*3/uL (ref 0.00–0.07)
Basophils Absolute: 0 10*3/uL (ref 0.0–0.1)
Basophils Relative: 1 %
Eosinophils Absolute: 0.1 10*3/uL (ref 0.0–0.5)
Eosinophils Relative: 1 %
HCT: 40.4 % (ref 39.0–52.0)
Hemoglobin: 13.3 g/dL (ref 13.0–17.0)
Immature Granulocytes: 0 %
Lymphocytes Relative: 53 %
Lymphs Abs: 4.1 10*3/uL — ABNORMAL HIGH (ref 0.7–4.0)
MCH: 30.9 pg (ref 26.0–34.0)
MCHC: 32.9 g/dL (ref 30.0–36.0)
MCV: 93.7 fL (ref 80.0–100.0)
Monocytes Absolute: 0.5 10*3/uL (ref 0.1–1.0)
Monocytes Relative: 6 %
Neutro Abs: 3 10*3/uL (ref 1.7–7.7)
Neutrophils Relative %: 39 %
Platelet Count: 136 10*3/uL — ABNORMAL LOW (ref 150–400)
RBC: 4.31 MIL/uL (ref 4.22–5.81)
RDW: 13.6 % (ref 11.5–15.5)
WBC Count: 7.7 10*3/uL (ref 4.0–10.5)
nRBC: 0 % (ref 0.0–0.2)

## 2022-12-06 MED ORDER — PREVNAR 20 0.5 ML IM SUSY
0.5000 mL | PREFILLED_SYRINGE | INTRAMUSCULAR | 0 refills | Status: DC
  Filled 2022-12-06: qty 0.5, 1d supply, fill #0

## 2022-12-06 NOTE — Progress Notes (Signed)
  [facility] OFFICE PROGRESS NOTE   Diagnosis: CLL  INTERVAL HISTORY:   Mr. Georg returns as scheduled.  He feels well.  Good appetite.  No fever or night sweats.  He feels a new lymph node in the right posterior neck.  He had peripheral vision loss earlier this year and was diagnosed with a right occipital/temporal and left lacunar infarcts.  He reports taking Plavix for 6 weeks and is now maintained on aspirin.  Objective:  Vital signs in last 24 hours:  Blood pressure (!) 142/69, pulse 63, temperature 98.1 F (36.7 C), temperature source Oral, resp. rate 18, height  (1.778 m), weight 141 lb 11.2 oz (64.3 kg), SpO2 100 %.   Lymphatics: Bilateral 1/2-1 cm posterior cervical/scalene, and axillary nodes.  Pea-sized submental node Resp: Decreased breath sounds at the lower posterior chest bilaterally, no respiratory distress Cardio: Regular rate and rhythm GI: No hepatosplenomegaly Vascular: No leg edema   Lab Results:  Lab Results  Component Value Date   WBC 7.7 12/06/2022   HGB 13.3 12/06/2022   HCT 40.4 12/06/2022   MCV 93.7 12/06/2022   PLT 136 (L) 12/06/2022   NEUTROABS 3.0 12/06/2022    CMP  Lab Results  Component Value Date   NA 143 09/09/2022   K 4.0 09/09/2022   CL 104 09/09/2022   CO2 31 09/09/2022   GLUCOSE 88 09/09/2022   BUN 14 09/09/2022   CREATININE 0.87 09/09/2022   CALCIUM 9.3 09/09/2022   PROT 6.8 09/09/2022   ALBUMIN 4.5 09/09/2022   AST 15 09/09/2022   ALT 4 09/09/2022   ALKPHOS 46 09/09/2022   BILITOT 0.6 09/09/2022   GFRNONAA >60 05/13/2021     Medications: I have reviewed the patient's current medications.   Assessment/Plan: CLL Small bilateral cervical and axillary lymph nodes CT neck 04/29/2021-subcentimeter but abnormally numerous bilateral cervical lymph nodes.  Largest node 8 to 9 mm.  Small bilateral axillary lymph nodes.  Stable mediastinal lymph nodes appear relatively normal. Peripheral blood flow  cytometry 05/13/2021-monoclonal lambda restricted B-cell population with expression of CD20, CD5, and CD200 Weight loss Early satiety Mild dysphagia Parkinson's Right occipital/temporal and left lacunar CVAs January 2024    Disposition: Mr. Kappes appears stable.  He has stable small peripheral lymph nodes and mild lymphocytosis.  He has mild thrombocytopenia.  He is asymptomatic from the CLL.  There is no indication for treating the CLL at present.  He will receive a pneumococcal 20 vaccine today.  Mr. Kessen will return for an office visit in 8 months.  He will call in the interim for new symptoms.    Thornton Papas, MD  12/06/2022  2:55 PM

## 2022-12-20 ENCOUNTER — Other Ambulatory Visit: Payer: Self-pay | Admitting: Cardiology

## 2022-12-20 DIAGNOSIS — R609 Edema, unspecified: Secondary | ICD-10-CM | POA: Diagnosis not present

## 2022-12-20 DIAGNOSIS — I679 Cerebrovascular disease, unspecified: Secondary | ICD-10-CM | POA: Diagnosis not present

## 2022-12-20 DIAGNOSIS — C9110 Chronic lymphocytic leukemia of B-cell type not having achieved remission: Secondary | ICD-10-CM | POA: Diagnosis not present

## 2022-12-20 DIAGNOSIS — I6340 Cerebral infarction due to embolism of unspecified cerebral artery: Secondary | ICD-10-CM

## 2023-01-01 ENCOUNTER — Other Ambulatory Visit: Payer: Self-pay | Admitting: Neurology

## 2023-01-01 DIAGNOSIS — G20A2 Parkinson's disease without dyskinesia, with fluctuations: Secondary | ICD-10-CM

## 2023-01-10 ENCOUNTER — Ambulatory Visit: Payer: Medicare Other | Admitting: Physical Therapy

## 2023-01-10 ENCOUNTER — Ambulatory Visit: Payer: Medicare Other | Admitting: Speech Pathology

## 2023-01-10 ENCOUNTER — Encounter: Payer: Self-pay | Admitting: Physical Therapy

## 2023-01-10 ENCOUNTER — Ambulatory Visit: Payer: Medicare Other | Attending: Family Medicine | Admitting: Occupational Therapy

## 2023-01-10 DIAGNOSIS — R29818 Other symptoms and signs involving the nervous system: Secondary | ICD-10-CM

## 2023-01-10 DIAGNOSIS — G20A1 Parkinson's disease without dyskinesia, without mention of fluctuations: Secondary | ICD-10-CM | POA: Insufficient documentation

## 2023-01-10 NOTE — Therapy (Signed)
[facility] [address] [place], [place], [address] Phone: [phone]   Fax:  [phone]  Patient Details  Name: MARKIESE MAILE MRN: [id number] Date of Birth: 08-08-1946 Referring Provider:  Daisy Floro, MD  Encounter Date: 01/10/2023  Occupational Therapy Parkinson's Disease Screen  Hand dominance:  Rt handed    Physical Performance Test item #4 (donning/doffing jacket):  11 sec  Fastening/unfastening 3 buttons in:  29.28sec  9-hole peg test:    RUE  25.37 sec        LUE  27.15 sec  Box & Blocks Test:    LUE  42 blocks   Change in ability to perform ADLs/IADLs:  No changes reported  Other Comments:  BUE sh flexion slightly limited and Lt elbow limited in ext  Pt does not require occupational therapy services at this time.  Recommended occupational therapy screen in  6 months    Sheran Lawless, OT 01/10/2023, 10:01 AM  [facility] [address] [place], [place], [address] Phone: [phone]   Fax:  [phone]

## 2023-01-10 NOTE — Therapy (Signed)
[facility] [address] [place], [place], [address] Phone: [phone]   Fax:  [phone]  Patient Details  Name: Joshua Browning MRN: [id number] Date of Birth: [birth year] Referring Provider:  Daisy Floro, MD  Encounter Date: 01/10/2023  Physical Therapy Parkinson's Disease Screen   Timed Up and Go test: 9.8 seconds (previously 10.5 seconds)   10 meter walk test:10.81 seconds =  (previously 10.87 seconds = 3.02 ft/sec)  5 time sit to stand test:10.9 seconds with no UE support (previously 10.72 seconds with no UE support)  Reports walking is going well and walks outside when the weather is good. Reports sometimes his balance is not as good, like sudden turns "get a little wacky". No falls. Has been going to the [facility] moves classes.   Patient could potentially benefit from a PT evaluation due to reports of worsening balance for a quick tune up. Pt's outcome measures have remained stable since he was last here for PD Screens. Pt reports he has a lot going on right now and would rather schedule PD screens in 6 months. Discussed if pt needs a referral for balance within those months, can reach out to Dr. Arbutus Leas if things have gotten less busy. Pt verbalized understanding. Educated to continue performing previous HEP and working on [employer] moves outside of class.   Pt to be scheduled for PD screens in 6 months.    Drake Leach, PT, DPT  01/10/2023, 2:10 PM  [facility] [address] [place], [place], [address] Phone: [phone]   Fax:  [phone]

## 2023-01-10 NOTE — Therapy (Signed)
[facility] [address] [place], [place], [address] Phone: [phone]   Fax:  [phone]  Patient Details  Name: Joshua Browning MRN: [id number] Date of Birth: [birth year] Referring Provider:  Daisy Floro, MD  Encounter Date: 01/10/2023  Speech Therapy Parkinson's Disease Screen   Decibel Level today: 66 dB  (WNL=70-72 dB) with sound level meter 30cm away from pt's mouth. Pt's conversational volume has decreased since last treatment course. Patient mentions that his communication is going well at home, with no overt communication challenges.   Pt does not report difficulty with swallowing, reporting improved swallow function since esophageal dilation. Ongoing challenges with street names and people's names as primary concern re: cognitive skills.   Pt would benefit from skilled ST to address dysarthria, however pt declines at this time. Recommend ST screen in another six months  Maia Breslow, CCC-SLP 01/10/2023, 11:57 AM  [facility] [address] [place], [place], [address] Phone: [phone]   Fax:  [phone]

## 2023-01-13 DIAGNOSIS — L821 Other seborrheic keratosis: Secondary | ICD-10-CM | POA: Diagnosis not present

## 2023-01-13 DIAGNOSIS — L570 Actinic keratosis: Secondary | ICD-10-CM | POA: Diagnosis not present

## 2023-01-13 DIAGNOSIS — D1801 Hemangioma of skin and subcutaneous tissue: Secondary | ICD-10-CM | POA: Diagnosis not present

## 2023-01-13 DIAGNOSIS — Z85828 Personal history of other malignant neoplasm of skin: Secondary | ICD-10-CM | POA: Diagnosis not present

## 2023-01-17 ENCOUNTER — Other Ambulatory Visit: Payer: Self-pay | Admitting: Neurology

## 2023-01-17 DIAGNOSIS — R251 Tremor, unspecified: Secondary | ICD-10-CM

## 2023-01-17 DIAGNOSIS — G2581 Restless legs syndrome: Secondary | ICD-10-CM

## 2023-01-17 DIAGNOSIS — G20A1 Parkinson's disease without dyskinesia, without mention of fluctuations: Secondary | ICD-10-CM

## 2023-01-17 DIAGNOSIS — G20A2 Parkinson's disease without dyskinesia, with fluctuations: Secondary | ICD-10-CM

## 2023-02-10 ENCOUNTER — Other Ambulatory Visit: Payer: Self-pay | Admitting: Neurology

## 2023-02-10 DIAGNOSIS — G2581 Restless legs syndrome: Secondary | ICD-10-CM

## 2023-02-10 DIAGNOSIS — G20A1 Parkinson's disease without dyskinesia, without mention of fluctuations: Secondary | ICD-10-CM

## 2023-02-10 DIAGNOSIS — R251 Tremor, unspecified: Secondary | ICD-10-CM

## 2023-02-15 DIAGNOSIS — Z833 Family history of diabetes mellitus: Secondary | ICD-10-CM | POA: Diagnosis not present

## 2023-02-15 DIAGNOSIS — I679 Cerebrovascular disease, unspecified: Secondary | ICD-10-CM | POA: Diagnosis not present

## 2023-02-15 DIAGNOSIS — R609 Edema, unspecified: Secondary | ICD-10-CM | POA: Diagnosis not present

## 2023-02-20 DIAGNOSIS — E7800 Pure hypercholesterolemia, unspecified: Secondary | ICD-10-CM | POA: Diagnosis not present

## 2023-02-20 DIAGNOSIS — Z0000 Encounter for general adult medical examination without abnormal findings: Secondary | ICD-10-CM | POA: Diagnosis not present

## 2023-02-20 DIAGNOSIS — G20A2 Parkinson's disease without dyskinesia, with fluctuations: Secondary | ICD-10-CM | POA: Diagnosis not present

## 2023-03-05 ENCOUNTER — Other Ambulatory Visit: Payer: Self-pay | Admitting: Neurology

## 2023-03-05 DIAGNOSIS — G20A1 Parkinson's disease without dyskinesia, without mention of fluctuations: Secondary | ICD-10-CM

## 2023-03-10 NOTE — Progress Notes (Unsigned)
Assessment/Plan:   1.  Parkinsons Disease   -Continue carbidopa/levodopa 25/100, 2 tablets 3 times per day.    -Continue carbidopa/levodopa 50/200 at bedtime  -Continue clonazepam 0.5 mg, half tablet at bedtime for cramping and sleep.  2.  MCI   -Patient had neurocognitive testing with Dr. Alinda Dooms in 2017.  Repeat testing in 10/2022 demonstrated MCI again, although potentially a bit worse (but he had had a stroke not long prior)  -OT driving eval in 01/5783 at [facility] was good   3.  Mild depression/insomnia  -Doing well with mirtazapine, 15 mg at bedtime  4.  B12 deficiency  -On oral supplementation  -recheck level as has been few years  5.  Diplopia  -This was due to dislocated intraocular lens implant.  Resolved after sx  6.  CLL  -Diagnosed September, 2022.  -Following with oncology.  7.  Basal cell carcinoma  -Following with dermatology.  8.  Weight loss  -Patient previously with extensive negative workup.              -he is already taking in extra protein  -not due to Parkinsons Disease  -weight loss continues             -on mirtazapine from me, which should, in theory, cause weight gain  9.  Ankle/feet paresthesias  -EMG negative for large fiber neuropathy.  -MRI lumbar spine with evidence of left S1 radiculopathy.  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  Patient without back pain.  10.  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  -Talked about importance of blood pressure control with a goal <130/80 mm Hg.   -Most recent LDL 104.  Goal less than 70.  On Lipitor  -discussed importance of following up with eye doctor to make sure okay to drive given occipital location.    Subjective:   Joshua Browning was seen today in follow up for Parkinsons disease.  My previous records were reviewed prior to todays visit as well as  outside records available to me.  Pt with wife who supplements hx. patient did OT dirving eval via [facility] in June and that looked good.  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  He was evaluated again by Dr. Alinda Dooms in March and she diagnosed continued MCI, without current dementia.  He wore a cardiac monitor since last visit and there was no evidence of atrial fibrillation.  He had an echo with bubble study that was unremarkable.  Carotid ultrasound was normal.   Current prescribed movement disorder medications: Carbidopa/levodopa 25/100, 2 tablets 3 times per day (increased) Carbidopa/levodopa 50/200 at bedtime Mirtazapine, 15 mg at bedtime Clonazepam 0.5 mg, half a tablet at bedtime  B12 supplement    ALLERGIES:  No Known Allergies  CURRENT MEDICATIONS:  Outpatient Encounter Medications as of 03/14/2023  Medication Sig   aspirin EC 81 MG tablet Take 81 mg by mouth daily. Swallow whole.   atorvastatin (LIPITOR) 20 MG tablet Take 1 tablet (20 mg total) by mouth daily.   carbidopa-levodopa (SINEMET CR) 50-200 MG tablet TAKE 1 TABLET BY MOUTH EVERYDAY AT BEDTIME   carbidopa-levodopa (SINEMET IR) 25-100 MG tablet TAKE 2 TABLETS BY MOUTH 3 TIMES DAILY.   Cholecalciferol (VITAMIN D3) 1000 UNITS CAPS Take 1 capsule by mouth every evening.   clonazePAM (KLONOPIN) 0.5 MG tablet TAKE 1/2 TABLET BY  MOUTH EVERY DAY AT BEDTIME   cyanocobalamin 1000 MCG tablet Take 1,000 mcg by mouth daily.   famotidine (PEPCID) 20 MG tablet Take 20 mg by mouth at bedtime. Takes pepcid ac   Glucosamine Sulfate 1000 MG CAPS Take by mouth daily.    Magnesium 100 MG CAPS Take 1 capsule by mouth every evening.   mirtazapine (REMERON) 15 MG tablet TAKE 1 TABLET BY MOUTH EVERYDAY AT BEDTIME   pneumococcal 20-valent conjugate vaccine (PREVNAR 20) 0.5 ML injection Inject 0.5 mLs into the muscle.   Potassium 99 MG TABS Take by mouth daily.   No facility-administered encounter medications on  file as of 03/14/2023.    Objective:   PHYSICAL EXAMINATION:    VITALS:   There were no vitals filed for this visit.    Wt Readings from Last 3 Encounters:  12/06/22 141 lb 11.2 oz (64.3 kg)  10/26/22 141 lb 9.6 oz (64.2 kg)  09/28/22 132 lb (59.9 kg)   Virtually 100% of visit in counseling  I have reviewed and interpreted the following labs independently    Chemistry      Component Value Date/Time   NA 143 09/09/2022 0900   K 4.0 09/09/2022 0900   CL 104 09/09/2022 0900   CO2 31 09/09/2022 0900   BUN 14 09/09/2022 0900   CREATININE 0.87 09/09/2022 0900   CREATININE 0.89 05/13/2021 1512   CREATININE 0.86 06/25/2019 1049      Component Value Date/Time   CALCIUM 9.3 09/09/2022 0900   ALKPHOS 46 09/09/2022 0900   AST 15 09/09/2022 0900   AST 16 05/13/2021 1512   ALT 4 09/09/2022 0900   ALT 5 05/13/2021 1512   BILITOT 0.6 09/09/2022 0900   BILITOT 0.5 05/13/2021 1512       Lab Results  Component Value Date   WBC 7.7 12/06/2022   HGB 13.3 12/06/2022   HCT 40.4 12/06/2022   MCV 93.7 12/06/2022   PLT 136 (L) 12/06/2022    Lab Results  Component Value Date   TSH 1.49 06/25/2019   Lab Results  Component Value Date   VITAMINB12 >1500 (H) 09/09/2022   Hemoglobin A1c done June, 2023 was 5.5.  We called primary care physician and they sent all labs available.  Last lipid panel was done June, 2022 and his LDL was 85.  Total time spent on today's visit was *** minutes, including both face-to-face time and nonface-to-face time.  Time included that spent on review of records (prior notes available to me/labs/imaging if pertinent), discussing treatment and goals, answering patient's questions and coordinating care.  Cc:  Daisy Floro, MD

## 2023-03-14 ENCOUNTER — Ambulatory Visit: Payer: Medicare Other | Admitting: Neurology

## 2023-03-14 ENCOUNTER — Encounter: Payer: Self-pay | Admitting: Neurology

## 2023-03-14 VITALS — BP 116/74 | HR 74 | Ht 70.0 in | Wt 142.0 lb

## 2023-03-14 DIAGNOSIS — G20A1 Parkinson's disease without dyskinesia, without mention of fluctuations: Secondary | ICD-10-CM

## 2023-03-14 NOTE — Patient Instructions (Signed)
 SAVE THE DATE!  We are planning a Parkinsons Disease educational symposium at [facility] in [place] on October 11.  More details to come!  If you would like to be added to our email list to get further information, email [email or web address].  We hope to see you there!

## 2023-03-17 ENCOUNTER — Ambulatory Visit: Payer: Medicare Other | Attending: Family Medicine | Admitting: Physical Therapy

## 2023-03-17 DIAGNOSIS — M6281 Muscle weakness (generalized): Secondary | ICD-10-CM | POA: Insufficient documentation

## 2023-03-17 DIAGNOSIS — R2681 Unsteadiness on feet: Secondary | ICD-10-CM | POA: Insufficient documentation

## 2023-03-17 DIAGNOSIS — R293 Abnormal posture: Secondary | ICD-10-CM | POA: Insufficient documentation

## 2023-03-17 DIAGNOSIS — G20A1 Parkinson's disease without dyskinesia, without mention of fluctuations: Secondary | ICD-10-CM | POA: Diagnosis not present

## 2023-03-17 DIAGNOSIS — R278 Other lack of coordination: Secondary | ICD-10-CM | POA: Diagnosis not present

## 2023-03-17 DIAGNOSIS — R2689 Other abnormalities of gait and mobility: Secondary | ICD-10-CM | POA: Diagnosis not present

## 2023-03-17 DIAGNOSIS — R29818 Other symptoms and signs involving the nervous system: Secondary | ICD-10-CM | POA: Diagnosis not present

## 2023-03-17 NOTE — Therapy (Signed)
OUTPATIENT PHYSICAL THERAPY NEURO EVALUATION   Patient Name: Joshua Browning MRN: [id number] DOB:[birth year], 77 y.o., male Today's Date: 03/17/2023   PCP: Daisy Floro, MD  REFERRING PROVIDER: Tat, Octaviano Batty, DO    END OF SESSION:  PT End of Session - 03/17/23 1621     Visit Number 1    Number of Visits 13    Date for PT Re-Evaluation 06/15/23   due to delay in scheduling   Authorization Type [employer]    PT Start Time 1525    PT Stop Time 1613    PT Time Calculation (min) 48 min    Equipment Utilized During Treatment Gait belt    Activity Tolerance Patient tolerated treatment well    Behavior During Therapy WFL for tasks assessed/performed             Past Medical History:  Diagnosis Date   BPH (benign prostatic hyperplasia)    Foley catheter in place    removed after turp   GERD (gastroesophageal reflux disease)    Nocturnal leg cramps    occ   Parkinson's disease    Urinary retention    Wears glasses    Past Surgical History:  Procedure Laterality Date   CATARACT EXTRACTION W/ INTRAOCULAR LENS  IMPLANT, BILATERAL Bilateral    INGUINAL HERNIA REPAIR Bilateral RIGHT x 2 1984 umbilical done with last right   left last one done 10 yrs ago   RE-DO RIGHT INGUINAL HERNIA REPAIR AND UMBILICAL HERNIA REPAIR  2001   REMOVAL LEFT EYE INTRAOCULAR LENS AND REPLACEMENT   08-29-2013   TRANSURETHRAL RESECTION OF PROSTATE N/A 09/23/2013   Procedure: TRANSURETHRAL RESECTION OF THE PROSTATE (TURP) WITH GYRUS;  Surgeon: Garnett Farm, MD;  Location: [facility];  Service: Urology;  Laterality: N/A;   VARICOCELECTOMY Left 07/02/2018   Procedure: VARICOCELECTOMY;  Surgeon: Ihor Gully, MD;  Location: [facility];  Service: Urology;  Laterality: Left;   Patient Active Problem List   Diagnosis Date Noted   H/O: stroke 10/27/2022   Cerebrovascular accident (CVA) (HCC) 09/28/2022   Exertional dyspnea 09/28/2022   Parkinson's disease  without dyskinesia, with fluctuating manifestations 09/09/2022   BPH (benign prostatic hypertrophy) with urinary retention 09/23/2013    ONSET DATE: 03/14/2023   REFERRING DIAG: G20.A1 (ICD-10-CM) - Parkinson's disease without dyskinesia or fluctuating manifestations   THERAPY DIAG:  Other symptoms and signs involving the nervous system  Unsteadiness on feet  Abnormal posture  Other lack of coordination  Other abnormalities of gait and mobility  Muscle weakness (generalized)  Rationale for Evaluation and Treatment: Rehabilitation  SUBJECTIVE:  SUBJECTIVE STATEMENT: Not walking as well. Feels like he is swinging his L leg out after CVA. Also had some visual deficits. Had a driving evaluation with OT in mid June and passed. Balance is not as good as it was, esp when having to make a sudden move. Backwards balance has always been a little bit of a problem. Has some tennis shoes that are really flat and that will just feel himself going backwards. No falls. Still doing the [facility] moves classes on Wednesday.   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 No  PRECAUTIONS: Fall   WEIGHT BEARING RESTRICTIONS: No  FALLS: Has patient fallen in last 6 months? No  LIVING ENVIRONMENT: Lives with: lives with their spouse Lives in: House/apartment Stairs: Yes: Internal: 12 steps; on left going up and External: 4 steps; can reach both Has following equipment at home: Grab bars and trekking poles recommended by Dr. Arbutus Leas, has not bought them but wants to work on them   PLOF: Independent  PATIENT GOALS: Wants to improve his gait/balance, learn to use trekking poles    OBJECTIVE:   DIAGNOSTIC FINDINGS: MRI brain 09/18/22:  IMPRESSION: 1. Recent right occipital and posterior temporal cortex infarct. 2. Subacute lacunar infarct in the deep left cerebral white matter. 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 3. Chronic cervical adenopathy also seen on neck CT from 2022, question low-grade lymphoma such as CLL.  MRI lumbar spine with evidence of left S1 radiculopathy. 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     COGNITION: Overall cognitive status: Dr. Alinda Dooms: Repeat testing in 10/2022 demonstrated MCI again, although potentially a bit worse (but he had had a stroke not long prior)    SENSATION: Light touch: WFL  COORDINATION: Heel to shin: WNL  POSTURE: rounded shoulders, forward head, increased thoracic kyphosis, and posterior pelvic tilt  LOWER EXTREMITY ROM:     Decr AROM of knee extension LLE> RLE   LOWER EXTREMITY MMT:    MMT Right Eval Left Eval  Hip flexion 4+ 4  Hip extension    Hip abduction 4 4  Hip adduction 4+ 4+  Hip internal rotation    Hip external rotation    Knee flexion 4+ 4-  Knee extension 4 4  Ankle dorsiflexion 5 4+  Ankle plantarflexion    Ankle inversion    Ankle eversion    (Blank rows = not tested)  All tested in sitting   BED MOBILITY:  Pt reports independence   TRANSFERS: Assistive device utilized: None  Sit to stand: SBA Stand to sit: SBA  Decr forward lean at times   GAIT: Gait pattern: decreased arm swing- Left, decreased stride length, decreased hip/knee flexion- Right, decreased hip/knee flexion- Left, circumduction- Left, Right foot flat, Left foot flat, trunk flexed, narrow BOS, and poor foot clearance- Left Distance walked: Clinic distances  Assistive device utilized: None Level of assistance: SBA Comments: Pt with LLE circumduction and narrow BOS    FUNCTIONAL TESTS:  5 times sit to stand: 11.59 seconds, last rep decr  eccentric control back down to chair with no UE support 10 meter walk test: 12.87 seconds = 2.55 ft/sec, limited community ambulator with no AD     [facility] PT Assessment - 03/17/23 1553       Standardized Balance Assessment   Standardized Balance Assessment Mini-BESTest;Timed  Up and Go Test      Mini-BESTest   Sit To Stand Normal: Comes to stand without use of hands and stabilizes independently.    Rise to Toes Moderate: Heels up, but not full range (smaller than when holding hands), OR noticeable instability for 3 s.    Stand on one leg (left) Moderate: < 20 s   4.3, 7   Stand on one leg (right) Moderate: < 20 s   7.8, 10.8   Stand on one leg - lowest score 1    Compensatory Stepping Correction - Forward Normal: Recovers independently with a single, large step (second realignement is allowed).    Compensatory Stepping Correction - Backward No step, OR would fall if not caught, OR falls spontaneously.   multiple steps, needed to grab onto // bars for balance   Compensatory Stepping Correction - Left Lateral Moderate: Several steps to recover equilibrium   2 steps   Compensatory Stepping Correction - Right Lateral Normal: Recovers independently with 1 step (crossover or lateral OK)    Stepping Corredtion Lateral - lowest score 1    Stance - Feet together, eyes open, firm surface  Normal: 30s    Stance - Feet together, eyes closed, foam surface  Moderate: < 30s   13 seconds   Incline - Eyes Closed Moderate: Stands independently < 30s OR aligns with surface   12 seconds   Change in Gait Speed Normal: Significantly changes walkling speed without imbalance    Walk with head turns - Horizontal Moderate: performs head turns with reduction in gait speed.    Walk with pivot turns Moderate:Turns with feet close SLOW (>4 steps) with good balance.    Step over obstacles Moderate: Steps over box but touches box OR displays cautious behavior by slowing gait.    Timed UP & GO with Dual Task Moderate: Dual  Task affects either counting OR walking (>10%) when compared to the TUG without Dual Task.    Mini-BEST total score 17      Timed Up and Go Test   Normal TUG (seconds) 11.1   small steps when turning   Cognitive TUG (seconds) 16.8   starting at 77, backwards by 3   TUG Comments Scores >10% difference indicate increased fall risk/difficulty with dual tasking             TODAY'S TREATMENT:                                                                                                                             N/A during eval    PATIENT EDUCATION: Education details: Clinical findings, POC Person educated: Patient Education method: Explanation Education comprehension: verbalized understanding  HOME EXERCISE PROGRAM: From previous bout of therapy: Standing PWR Moves, [id number]   GOALS: Goals reviewed with patient? Yes  SHORT TERM GOALS: Target date: 04/14/2023   Pt will be independent with initial HEP for improved balance, posture, functional mobility.  Baseline: Goal status: INITIAL    LONG TERM GOALS: Target date: 05/12/2023  Pt will be independent with final PD specific HEP for improved balance, posture, functional mobility.  Baseline:  Goal status: INITIAL  2.  Pt will improve posterior step and release test to 2 steps or less with independent balance recovery, to demonstrate improved balance recovery in posterior direction.  Baseline: multiple steps, needs to grab onto // bars for balance  Goal status: INITIAL  3.  Pt will improve miniBEST to at least a 21/28 in order to demo decr fall risk. Baseline: 17/28 Goal status: INITIAL  4.  Pt will improve gait speed with no AD to at least 2.8 ft/sec in order to demo improved community mobility.  Baseline: 12.87 seconds = 2.55 ft/sec, limited community ambulator with no AD Goal status: INITIAL  5.  Pt will be able to ambulate at least 500' outdoors with supervision and bilateral trekking poles for exercising in the  community.  Baseline:  Goal status: INITIAL    ASSESSMENT:  CLINICAL IMPRESSION: Patient is a 77 year old male referred to Neuro OPPT for PD. Pt with worsening balance and impaired timing/coordination of gait.   Pt's PMH is significant for: PD, MCI, CLL, basal cell carcinoma, Ankle/feet paresthesias, CVA with field cut (09/2022) . The following deficits were present during the exam: gait abnormalities, impaired balance, decr functional strength, abnormal posture, postural instability, decr hamstring AROM. Based on mini BEST, pt is an incr risk for falls. Pt's gait speed indicates that pt is a limited community ambulator. Pt would benefit from skilled PT to address these impairments and functional limitations to maximize functional mobility independence and decr fall risk.    OBJECTIVE IMPAIRMENTS: Abnormal gait, decreased activity tolerance, decreased balance, decreased cognition, decreased coordination, difficulty walking, decreased ROM, decreased strength, impaired flexibility, impaired sensation, and postural dysfunction.   ACTIVITY LIMITATIONS: lifting, bending, stairs, transfers, and locomotion level  PARTICIPATION LIMITATIONS: community activity  PERSONAL FACTORS: Age, Behavior pattern, Past/current experiences, Time since onset of injury/illness/exacerbation, and 3+ comorbidities: PD, MCI, CLL, basal cell carcinoma, Ankle/feet paresthesias, CVA with field cut (09/2022)   are also affecting patient's functional outcome.   REHAB POTENTIAL: Good  CLINICAL DECISION MAKING: Evolving/moderate complexity  EVALUATION COMPLEXITY: Moderate  PLAN:  PT FREQUENCY: 2x/week  PT DURATION: 8 weeks  PLANNED INTERVENTIONS: Therapeutic exercises, Therapeutic activity, Neuromuscular re-education, Balance training, Gait training, Patient/Family education, Self Care, Joint mobilization, Stair training, Vestibular training, DME instructions, Manual therapy, and Re-evaluation  PLAN FOR NEXT SESSION:  review previous HEP and update as appropriate. L hip strengthening. Balance with vision removed, SLS. TURNING. STEPPING STRATEGIES - esp L lateral and posteriorly. work on walking with trekking poles    [place], [place], DPT 03/17/2023, 4:22 PM

## 2023-03-27 ENCOUNTER — Encounter: Payer: Self-pay | Admitting: Neurology

## 2023-03-27 ENCOUNTER — Telehealth: Payer: Self-pay

## 2023-03-27 DIAGNOSIS — R519 Headache, unspecified: Secondary | ICD-10-CM | POA: Diagnosis not present

## 2023-03-27 NOTE — Telephone Encounter (Signed)
Pt called he was not able to get to the phone spoke with his wife who is on the DPR she was advised that Dr Tat received the [employer] and Dr Tat stated that the Patient should go to His PCP to be evaluated. Pt wife stated that they will call the PCP to try to get in,

## 2023-03-29 ENCOUNTER — Emergency Department (HOSPITAL_BASED_OUTPATIENT_CLINIC_OR_DEPARTMENT_OTHER)
Admission: EM | Admit: 2023-03-29 | Discharge: 2023-03-29 | Disposition: A | Discharge status: Home / Self Care | Payer: Medicare Other | Attending: Emergency Medicine | Admitting: Emergency Medicine

## 2023-03-29 ENCOUNTER — Emergency Department (HOSPITAL_BASED_OUTPATIENT_CLINIC_OR_DEPARTMENT_OTHER): Payer: Medicare Other

## 2023-03-29 ENCOUNTER — Encounter (HOSPITAL_BASED_OUTPATIENT_CLINIC_OR_DEPARTMENT_OTHER): Payer: Self-pay | Admitting: Emergency Medicine

## 2023-03-29 ENCOUNTER — Other Ambulatory Visit: Payer: Self-pay

## 2023-03-29 ENCOUNTER — Other Ambulatory Visit (HOSPITAL_BASED_OUTPATIENT_CLINIC_OR_DEPARTMENT_OTHER): Payer: Self-pay

## 2023-03-29 DIAGNOSIS — R519 Headache, unspecified: Secondary | ICD-10-CM | POA: Insufficient documentation

## 2023-03-29 DIAGNOSIS — G9389 Other specified disorders of brain: Secondary | ICD-10-CM | POA: Diagnosis not present

## 2023-03-29 DIAGNOSIS — Z7982 Long term (current) use of aspirin: Secondary | ICD-10-CM | POA: Diagnosis not present

## 2023-03-29 DIAGNOSIS — G20C Parkinsonism, unspecified: Secondary | ICD-10-CM | POA: Diagnosis not present

## 2023-03-29 DIAGNOSIS — Z8673 Personal history of transient ischemic attack (TIA), and cerebral infarction without residual deficits: Secondary | ICD-10-CM | POA: Insufficient documentation

## 2023-03-29 DIAGNOSIS — I6782 Cerebral ischemia: Secondary | ICD-10-CM | POA: Diagnosis not present

## 2023-03-29 DIAGNOSIS — R42 Dizziness and giddiness: Secondary | ICD-10-CM | POA: Diagnosis not present

## 2023-03-29 HISTORY — DX: Cerebral infarction, unspecified: I63.9

## 2023-03-29 LAB — BASIC METABOLIC PANEL
Anion gap: 8 (ref 5–15)
BUN: 18 mg/dL (ref 8–23)
CO2: 29 mmol/L (ref 22–32)
Calcium: 9.6 mg/dL (ref 8.9–10.3)
Chloride: 106 mmol/L (ref 98–111)
Creatinine, Ser: 0.94 mg/dL (ref 0.61–1.24)
GFR, Estimated: >60 mL/min (ref >60)
Glucose, Bld: 87 mg/dL (ref 70–99)
Potassium: 4 mmol/L (ref 3.5–5.1)
Sodium: 143 mmol/L (ref 135–145)

## 2023-03-29 LAB — CBC
HCT: 41.6 % (ref 39.0–52.0)
Hemoglobin: 13.8 g/dL (ref 13.0–17.0)
MCH: 31.3 pg (ref 26.0–34.0)
MCHC: 33.2 g/dL (ref 30.0–36.0)
MCV: 94.3 fL (ref 80.0–100.0)
Platelets: 156 10*3/uL (ref 150–400)
RBC: 4.41 MIL/uL (ref 4.22–5.81)
RDW: 13.6 % (ref 11.5–15.5)
WBC: 8.7 10*3/uL (ref 4.0–10.5)
nRBC: 0 % (ref 0.0–0.2)

## 2023-03-29 LAB — CBG MONITORING, ED: Glucose-Capillary: 82 mg/dL (ref 70–99)

## 2023-03-29 MED ORDER — ACETAMINOPHEN 500 MG PO TABS
500.0000 mg | ORAL_TABLET | Freq: Four times a day (QID) | ORAL | 0 refills | Status: AC | PRN
Start: 2023-03-29 — End: ?
  Filled 2023-03-29: qty 30, 8d supply, fill #0

## 2023-03-29 MED ORDER — GABAPENTIN 100 MG PO CAPS
100.0000 mg | ORAL_CAPSULE | Freq: Three times a day (TID) | ORAL | 0 refills | Status: DC | PRN
Start: 2023-03-29 — End: 2023-04-27
  Filled 2023-03-29: qty 20, 7d supply, fill #0

## 2023-03-29 NOTE — ED Provider Notes (Signed)
I provided a substantive portion of the care of this patient.  I personally made/approved the management plan for this patient and take responsibility for the patient management.  EKG Interpretation Date/Time:  Wednesday March 29 2023 14:30:35 EDT Ventricular Rate:  69 PR Interval:  172 QRS Duration:  100 QT Interval:  418 QTC Calculation: 447 R Axis:   -18  Text Interpretation: Normal sinus rhythm Incomplete right bundle branch block Minimal voltage criteria for LVH, may be normal variant ( R in aVL ) Septal infarct , age undetermined Abnormal ECG When compared with ECG of 14-Jul-2000 11:25, Incomplete right bundle branch block is now Present Septal infarct is now Present Confirmed by Vonita Moss [id number]) on 03/29/2023 3:08:12 PM   Patient has history of Parkinson's disease.  He was doing Parkinson's rehab therapy today.  He felt more unsteady than usual.  Also somewhat less cognitively clear than baseline.  There has been some stuttering of the symptoms over the past couple of days.  Hard to say there was one immediate onset but the patient and his wife definitely noticed that it seemed worse today when they were going to the rehab.  Patient's wife reports that normally he walks independently up to the counter to sign in and today he seemed unsteady and needed some extra assistance that was not typical.  At baseline patient has some prior stroke deficits with left-sided weakness but functionality.  Patient is alert and nontoxic.  No respiratory distress.  Heart regular.  Lungs grossly clear.  Patient speech is clear with normal content and no sign of aphasia.  He follows commands appropriately.  No evidently localizing weakness although at baseline he reports left side is weaker than right.  Patient is compliant with daily aspirin with prior history of stroke.  EMR reviewed, patient has had stroke workup earlier this year does not have any significant carotid disease and does not have history  of atrial fibrillation.  I agree with plan of management.   Arby Barrette, MD 03/29/23 1623

## 2023-03-29 NOTE — ED Notes (Signed)
Pt verbalized understanding of d/c instructions, meds, and followup care. Denies questions. VSS, no distress noted. Steady gait to exit with all belongings.  ?

## 2023-03-29 NOTE — ED Triage Notes (Signed)
Pt presents from "the parkinsons move class upstairs" for intermittent R sided HA since Monday. Today he is having trouble with balance and seems less cognitively clear per wife. He is able to answer all questions appropriately.  Pt denies dizziness in the ER triage room.   NIH 0 in triage. Previous CVA caused vision changes and confusion.

## 2023-03-29 NOTE — ED Provider Notes (Signed)
[place] EMERGENCY DEPARTMENT AT [facility] Provider Note   CSN: [id number] Arrival date & time: 03/29/23  1418     History  Chief Complaint  Patient presents with   Headache    Joshua Browning is a 77 y.o. male.  The history is provided by the patient, the spouse and medical records. No language interpreter was used.  Headache    77 year old male prior history of CVA, Parkinson disease, presenting today with complaint of headache.  Patient report for the past 3 days he has had recurrent headache.  He describes headache as a sharp shooting sensation to his right side of head that happens sporadically.  He also states he does not feel his normal self.  States that everything seems much slower for him he has trouble processing things and having trouble with keeping up with his exercise while he was at the [facility] clinic today.  Patient also states he felt unsteady on his feet.  States headache is mild but it does concerns him.  He did reach out to his neurologist initially who told him to follow-up with his PCP.  He follow-up with her PCP but was told that his headache is likely due to some nerve pain.  Due to worsening symptoms he decided come here for further assessment.  Does not endorse any fever or chills no runny nose sneezing or coughing no ear pain or loss of hearing or ringing sounds in his ear no chest pain or shortness of breath no abdominal pain no nausea vomiting diarrhea or dysuria.  No recent medication changes no recent sickness.  Home Medications Prior to Admission medications   Medication Sig Start Date End Date Taking? Authorizing Provider  aspirin EC 81 MG tablet Take 81 mg by mouth daily. Swallow whole.    [provider]  atorvastatin (LIPITOR) 20 MG tablet Take 1 tablet (20 mg total) by mouth daily. 09/26/22   Tat, Octaviano Batty, DO  carbidopa-levodopa (SINEMET CR) 50-200 MG tablet TAKE 1 TABLET BY MOUTH EVERYDAY AT BEDTIME 01/17/23   Tat,  Rebecca S, DO  carbidopa-levodopa (SINEMET IR) 25-100 MG tablet TAKE 2 TABLETS BY MOUTH 3 TIMES DAILY. 01/17/23   Tat, Octaviano Batty, DO  Cholecalciferol (VITAMIN D3) 1000 UNITS CAPS Take 1 capsule by mouth every evening.    [provider]  clonazePAM (KLONOPIN) 0.5 MG tablet TAKE 1/2 TABLET BY MOUTH EVERY DAY AT BEDTIME 03/06/23   Tat, Octaviano Batty, DO  cyanocobalamin 1000 MCG tablet Take 1,000 mcg by mouth daily.    [provider]  famotidine (PEPCID) 20 MG tablet Take 20 mg by mouth at bedtime. Takes Art gallery manager, Historical, MD  Glucosamine Sulfate 1000 MG CAPS Take by mouth daily.     [provider]  Magnesium 100 MG CAPS Take 1 capsule by mouth every evening.    [provider]  mirtazapine (REMERON) 15 MG tablet TAKE 1 TABLET BY MOUTH EVERYDAY AT BEDTIME 02/10/23   Tat, Octaviano Batty, DO  pneumococcal 20-valent conjugate vaccine (PREVNAR 20) 0.5 ML injection Inject 0.5 mLs into the muscle. 12/06/22     Potassium 99 MG TABS Take by mouth daily.    [provider]      Allergies    Patient has no known allergies.    Review of Systems   Review of Systems  Neurological:  Positive for headaches.  All other systems reviewed and are negative.   Physical Exam Updated Vital Signs BP Marland Kitchen)  144/81 (BP Location: Right Arm)   Pulse 71   Temp (!) 97.5 F (36.4 C) (Oral)   Resp 18   Wt 67.1 kg   SpO2 100%   BMI 21.24 kg/m  Physical Exam Vitals and nursing note reviewed.  Constitutional:      General: He is not in acute distress.    Appearance: He is well-developed.  HENT:     Head: Atraumatic.  Eyes:     Extraocular Movements: Extraocular movements intact.     Conjunctiva/sclera: Conjunctivae normal.  Cardiovascular:     Rate and Rhythm: Normal rate and regular rhythm.  Pulmonary:     Effort: Pulmonary effort is normal.     Breath sounds: Normal breath sounds.  Abdominal:     Palpations: Abdomen is soft.  Musculoskeletal:         General: Normal range of motion.     Cervical back: Normal range of motion and neck supple.  Skin:    General: Skin is warm.     Findings: No rash.  Neurological:     Mental Status: He is alert and oriented to person, place, and time.     GCS: GCS eye subscore is 4. GCS verbal subscore is 5. GCS motor subscore is 6.     Comments: Neurologic exam:  Speech clear, pupils equal round reactive to light, extraocular movements intact  Normal peripheral visual fields Cranial nerves III through XII normal including no facial droop Follows commands, moves all extremities x4, normal strength to bilateral upper and lower extremities at all major muscle groups including grip Sensation normal to light touch  Coordination intact, no limb ataxia, finger-nose-finger normal Rapid alternating movements normal No pronator drift Gait normal       ED Results / Procedures / Treatments   Labs (all labs ordered are listed, but only abnormal results are displayed) Labs Reviewed  BASIC METABOLIC PANEL  CBC  URINALYSIS, ROUTINE W REFLEX MICROSCOPIC  CBG MONITORING, ED    EKG EKG Interpretation Date/Time:  Wednesday March 29 2023 14:30:35 EDT Ventricular Rate:  69 PR Interval:  172 QRS Duration:  100 QT Interval:  418 QTC Calculation: 447 R Axis:   -18  Text Interpretation: Normal sinus rhythm Incomplete right bundle branch block Minimal voltage criteria for LVH, may be normal variant ( R in aVL ) Septal infarct , age undetermined Abnormal ECG When compared with ECG of 14-Jul-2000 11:25, Incomplete right bundle branch block is now Present Septal infarct is now Present Confirmed by Vonita Moss [id number]) on 03/29/2023 3:08:12 PM  Radiology CT HEAD WO CONTRAST  Result Date: 03/29/2023 CLINICAL DATA:  Intermittent right-sided headache since Monday difficulty with balance less cognitively clear EXAM: CT HEAD WITHOUT CONTRAST TECHNIQUE: Contiguous axial images were obtained from the base of the  skull through the vertex without intravenous contrast. RADIATION DOSE REDUCTION: This exam was performed according to the departmental dose-optimization program which includes automated exposure control, adjustment of the mA and/or kV according to patient size and/or use of iterative reconstruction technique. COMPARISON:  MRI 09/17/2022 FINDINGS: Brain: No acute territorial infarction, hemorrhage or intracranial mass. Atrophy. Extensive white matter hypodensity consistent with chronic small vessel disease. Encephalomalacia within the right temporal occipital lobe consistent with chronic infarct. Chronic lacunar infarcts in the right thalamus and left basal ganglia. Stable ventricle size. Vascular: No hyperdense vessels.  No unexpected calcification Skull: Normal. Negative for fracture or focal lesion. Sinuses/Orbits: No acute finding. Other: None IMPRESSION: 1. No CT evidence for acute intracranial abnormality.  2. Atrophy and extensive chronic small vessel ischemic changes of the white matter. Chronic right temporal occipital infarct. Chronic lacunar infarcts in the right thalamus and left basal ganglia. Electronically Signed   By: Jasmine Pang M.D.   On: 03/29/2023 16:07    Procedures Procedures    Medications Ordered in ED Medications - No data to display  ED Course/ Medical Decision Making/ A&P                                 Medical Decision Making Amount and/or Complexity of Data Reviewed Labs: ordered. Radiology: ordered.   BP (!) 144/81 (BP Location: Right Arm)   Pulse 71   Temp (!) 97.5 F (36.4 C) (Oral)   Resp 18   Wt 67.1 kg   SpO2 100%   BMI 21.24 kg/m   56:26 PM  77 year old male prior history of CVA, Parkinson disease, presenting today with complaint of headache.  Patient report for the past 3 days he has had recurrent headache.  He describes headache as a sharp shooting sensation to his right side of head that happens sporadically.  He also states he does not feel his  normal self.  States that everything seems much slower for him he has trouble processing things and having trouble with keeping up with his exercise while he was at the [facility] clinic today.  Patient also states he felt unsteady on his feet.  States headache is mild but it does concerns him.  He did reach out to his neurologist initially who told him to follow-up with his PCP.  He follow-up with her PCP but was told that his headache is likely due to some nerve pain.  Due to worsening symptoms he decided come here for further assessment.  Does not endorse any fever or chills no runny nose sneezing or coughing no ear pain or loss of hearing or ringing sounds in his ear no chest pain or shortness of breath no abdominal pain no nausea vomiting diarrhea or dysuria.  No recent medication changes no recent sickness.  On exam, patient is resting comfortably in bed appears to be in no acute discomfort.  Although his movement is slow consistent with his Parkinson he does not have any focal neurodeficit on exam.  He is able to have a steady albeit slow gait.  Strength is equal throughout.  He is mentating appropriately.  Vital sign review and overall reassuring.  4:02 PM I have reached out to neurologist Dr. Don Perking office and spoke with her partner Dr. Karel Jarvis who felt noncontrast head CT scan at this time should be sufficient and if negative and patient does not have any focal neurodeficit then patient should call the office tomorrow for close follow-up.  -Labs ordered, independently viewed and interpreted by me.  Labs remarkable for reassuring labs.   -The patient was maintained on a cardiac monitor.  I personally viewed and interpreted the cardiac monitored which showed an underlying rhythm of: NSR -Imaging independently viewed and interpreted by me and I agree with radiologist's interpretation.  Result remarkable for head CT without acute intracranial abnormality -This patient presents to the ED for  concern of headache, this involves an extensive number of treatment options, and is a complaint that carries with it a high risk of complications and morbidity.  The differential diagnosis includes tension headache, trigeminal neuralgia, migraine, stroke, electrolytes imbalance, parkinson -Co morbidities that complicate the patient evaluation  includes parkinson, stroke -Treatment includes reassurance -Reevaluation of the patient after these medicines showed that the patient stayed the same -PCP office notes or outside notes reviewed -Discussion with specialist neurologist Dr. Karel Jarvis -Escalation to admission/observation considered: patients feels much better, is comfortable with discharge, and will follow up with PCP -Prescription medication considered, patient comfortable with tylenol and gabapentin -Social Determinant of Health considered         Final Clinical Impression(s) / ED Diagnoses Final diagnoses:  Bad headache  Dizzy spells    Rx / DC Orders ED Discharge Orders          Ordered    acetaminophen (TYLENOL) 500 MG tablet  Every 6 hours PRN        03/29/23 1651    gabapentin (NEURONTIN) 100 MG capsule  3 times daily PRN        03/29/23 1651              Fayrene Helper, PA-C 03/29/23 1652    Arby Barrette, MD 04/02/23 [phone]

## 2023-03-29 NOTE — Discharge Instructions (Signed)
Please take Tylenol as needed for headache.  If Tylenol does not help, you may consider taking gabapentin as prescribed.  Please call and follow-up closely with your neurologist for outpatient evaluation of your condition.  Return to the ER if you have any concern.

## 2023-03-30 NOTE — Telephone Encounter (Signed)
Patients wife called stating that the patient has had recent ER visit for pain in his head, does not feel like a headache, it is sharp stabbing nerve pain. Patient is needing to be seen. Next available appointment in 9/16.

## 2023-04-11 ENCOUNTER — Encounter: Payer: Self-pay | Admitting: Physical Therapy

## 2023-04-11 ENCOUNTER — Ambulatory Visit: Payer: Medicare Other | Admitting: Physical Therapy

## 2023-04-11 VITALS — BP 132/74 | HR 63

## 2023-04-11 DIAGNOSIS — M6281 Muscle weakness (generalized): Secondary | ICD-10-CM | POA: Diagnosis not present

## 2023-04-11 DIAGNOSIS — R2689 Other abnormalities of gait and mobility: Secondary | ICD-10-CM | POA: Diagnosis not present

## 2023-04-11 DIAGNOSIS — G20A1 Parkinson's disease without dyskinesia, without mention of fluctuations: Secondary | ICD-10-CM | POA: Diagnosis not present

## 2023-04-11 DIAGNOSIS — R278 Other lack of coordination: Secondary | ICD-10-CM

## 2023-04-11 DIAGNOSIS — R2681 Unsteadiness on feet: Secondary | ICD-10-CM

## 2023-04-11 DIAGNOSIS — R293 Abnormal posture: Secondary | ICD-10-CM | POA: Diagnosis not present

## 2023-04-11 DIAGNOSIS — R29818 Other symptoms and signs involving the nervous system: Secondary | ICD-10-CM
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
## 2023-04-14 ENCOUNTER — Encounter: Payer: Self-pay | Admitting: Physical Therapy

## 2023-04-14 ENCOUNTER — Ambulatory Visit: Payer: Medicare Other | Admitting: Physical Therapy

## 2023-04-14 VITALS — BP 129/83 | HR 73

## 2023-04-14 DIAGNOSIS — R293 Abnormal posture: Secondary | ICD-10-CM | POA: Diagnosis not present

## 2023-04-14 DIAGNOSIS — R2689 Other abnormalities of gait and mobility: Secondary | ICD-10-CM

## 2023-04-14 DIAGNOSIS — R278 Other lack of coordination: Secondary | ICD-10-CM | POA: Diagnosis not present

## 2023-04-14 DIAGNOSIS — R2681 Unsteadiness on feet: Secondary | ICD-10-CM | POA: Diagnosis not present

## 2023-04-14 DIAGNOSIS — M6281 Muscle weakness (generalized): Secondary | ICD-10-CM

## 2023-04-14 DIAGNOSIS — G20A1 Parkinson's disease without dyskinesia, without mention of fluctuations: Secondary | ICD-10-CM | POA: Diagnosis not present

## 2023-04-14 DIAGNOSIS — R29818 Other symptoms and signs involving the nervous system: Secondary | ICD-10-CM | POA: Diagnosis not present

## 2023-04-14 NOTE — Therapy (Signed)
OUTPATIENT PHYSICAL THERAPY NEURO TREATMENT   Patient Name: KIJUAN FULLMER MRN: [id number] DOB:1946-02-02, 77 y.o., male Today's Date: 04/14/2023   PCP: Daisy Floro, MD REFERRING PROVIDER: Tat, Octaviano Batty, DO  END OF SESSION:  PT End of Session - 04/14/23 1234     Visit Number 3    Number of Visits 13    Date for PT Re-Evaluation 06/15/23    Authorization Type [employer] Medicare    PT Start Time 1231    PT Stop Time 1315    PT Time Calculation (min) 44 min    Equipment Utilized During Treatment Gait belt    Activity Tolerance Patient tolerated treatment well    Behavior During Therapy WFL for tasks assessed/performed             Past Medical History:  Diagnosis Date   BPH (benign prostatic hyperplasia)    CVA (cerebral vascular accident) (HCC)    Foley catheter in place    removed after turp   GERD (gastroesophageal reflux disease)    Nocturnal leg cramps    occ   Parkinson's disease    Urinary retention    Wears glasses    Past Surgical History:  Procedure Laterality Date   CATARACT EXTRACTION W/ INTRAOCULAR LENS  IMPLANT, BILATERAL Bilateral    INGUINAL HERNIA REPAIR Bilateral RIGHT x 2 1984 umbilical done with last right   left last one done 10 yrs ago   RE-DO RIGHT INGUINAL HERNIA REPAIR AND UMBILICAL HERNIA REPAIR  2001   REMOVAL LEFT EYE INTRAOCULAR LENS AND REPLACEMENT   08-29-2013   TRANSURETHRAL RESECTION OF PROSTATE N/A 09/23/2013   Procedure: TRANSURETHRAL RESECTION OF THE PROSTATE (TURP) WITH GYRUS;  Surgeon: Garnett Farm, MD;  Location: [facility] ;  Service: Urology;  Laterality: N/A;   VARICOCELECTOMY Left 07/02/2018   Procedure: VARICOCELECTOMY;  Surgeon: Ihor Gully, MD;  Location: [facility];  Service: Urology;  Laterality: Left;   Patient Active Problem List   Diagnosis Date Noted   H/O: stroke 10/27/2022   Cerebrovascular accident (CVA) (HCC) 09/28/2022   Exertional dyspnea 09/28/2022   Parkinson's  disease without dyskinesia, with fluctuating manifestations 09/09/2022   BPH (benign prostatic hypertrophy) with urinary retention 09/23/2013    ONSET DATE: 03/14/2023   REFERRING DIAG: G20.A1 (ICD-10-CM) - Parkinson's disease without dyskinesia or fluctuating manifestations   THERAPY DIAG:  Unsteadiness on feet  Abnormal posture  Other abnormalities of gait and mobility  Muscle weakness (generalized)  Rationale for Evaluation and Treatment: Rehabilitation  SUBJECTIVE:  SUBJECTIVE STATEMENT: Patient reports that he is wanting to work on swinging his leg out less when he walks. Denies falls/near falls.   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 No  PRECAUTIONS: Fall   WEIGHT BEARING RESTRICTIONS: No  FALLS: Has patient fallen in last 6 months? No  LIVING ENVIRONMENT: Lives with: lives with their spouse Lives in: House/apartment Stairs: Yes: Internal: 12 steps; on left going up and External: 4 steps; can reach both Has following equipment at home: Grab bars and trekking poles recommended by Dr. Arbutus Leas, has not bought them but wants to work on them   PLOF: Independent  PATIENT GOALS: Wants to improve his gait/balance, learn to use trekking poles   OBJECTIVE:   DIAGNOSTIC FINDINGS: MRI brain 09/18/22:  IMPRESSION: 1. Recent right occipital and posterior temporal cortex infarct. 2. Subacute lacunar infarct in the deep left cerebral white matter. 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 3. Chronic cervical adenopathy also seen on neck CT from 2022, question low-grade lymphoma such as CLL.  MRI lumbar spine with evidence of left S1  radiculopathy. 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     COGNITION: Overall cognitive status: Dr. Alinda Dooms: Repeat testing in 10/2022 demonstrated MCI again, although potentially a bit worse (but he had had a stroke not long prior)    SENSATION: Light touch: WFL  COORDINATION: Heel to shin: WNL  POSTURE: rounded shoulders, forward head, increased thoracic kyphosis, and posterior pelvic tilt  LOWER EXTREMITY ROM:     Decr AROM of knee extension LLE> RLE   LOWER EXTREMITY MMT:    MMT Right Eval Left Eval  Hip flexion 4+ 4  Hip extension    Hip abduction 4 4  Hip adduction 4+ 4+  Hip internal rotation    Hip external rotation    Knee flexion 4+ 4-  Knee extension 4 4  Ankle dorsiflexion 5 4+  Ankle plantarflexion    Ankle inversion    Ankle eversion    (Blank rows = not tested)  All tested in sitting   BED MOBILITY:  Pt reports independence   TRANSFERS: Assistive device utilized: None  Sit to stand: SBA Stand to sit: SBA  Decr forward lean at times   GAIT: Gait pattern: decreased arm swing- Left, decreased stride length, decreased hip/knee flexion- Right, decreased hip/knee flexion- Left, circumduction- Left, Right foot flat, Left foot flat, trunk flexed, narrow BOS, and poor foot clearance- Left Distance walked: Clinic distances  Assistive device utilized: None Level of assistance: SBA Comments: Pt with LLE circumduction and narrow BOS    FUNCTIONAL TESTS:  5 times sit to stand: 11.59 seconds, last rep decr eccentric control back down to chair with no UE support 10 meter walk test: 12.87 seconds = 2.55 ft/sec, limited community ambulator with no AD     TODAY'S TREATMENT:  Vitals:   04/14/23 1238  BP: 129/83  Pulse: 73    Gait:  GAIT: Gait pattern: step through  pattern, decreased hip/knee flexion- Right, decreased hip/knee flexion- Left, decreased ankle dorsiflexion- Right, decreased ankle dorsiflexion- Left, poor foot clearance- Right, and poor foot clearance- Left Distance walked: 1 x 130 feet indoors, 1 x 250 feet through grass Assistive device utilized: trekking Level of assistance: SBA Comments: Patient overall sequenced appropriately and demonstrated improved stability, no festination or catching toes on trekking poles in today's session, appropriate arm swing  NMR:   4 Blaze pods on random setting for improved lateral stepping reaching and reaching as well as balance on compliant surface.  Performed on one minute intervals with 30 second rest periods.  Pt requires CGA guarding. Round 1:  standing on blue foam at mirror with foam blocks on side of foam 2 blaze pod on ground for LE tapping and two on mirror for UE reaching setup.  9 hits. Round 2:  standing on blue foam at mirror with foam blocks on side of foam 2 blaze pod on ground for LE tapping and two on mirror for UE reaching  setup.  13 hits. Round 3:  standing on blue foam at mirror with foam blocks on side of foam 2 blaze pod on ground for LE tapping and two on mirror for UE reaching  setup.  17 hits. Notable errors/deficits: knocked over foam blocks 1x otherwise performed well, increasing speed and intensity with repititions    4 Blaze pods on random setting for improved turning, stepping reaction, and large amplitude movements.  Performed on one minute intervals with 30 second rest periods.  Pt requires CGA guarding. Round 1:  quarter turns between foam blocks with step overs and 2 pods on mirrors for UE tapping setup.  9 hits. Round 2:  quarter turns between foam blocks with step overs and 2 pods on mirrors for UE tapping setup.  12 hits. Round 3:  quarter turns between foam blocks with step overs and 2 pods on mirrors for UE tapping setup.  13 hits. Notable errors/deficits:  required  education on quarter turning amplitude of movements  Approach to chair practicing turning from second blaze pod exercise, improved stability noted from pre-activity    PATIENT EDUCATION: Education details: Continue HEP + quarter turning Person educated: Patient Education method: Programmer, multimedia, Demonstration, Verbal cues, and Handouts Education comprehension: verbalized understanding and returned demonstration  HOME EXERCISE PROGRAM: Standing PWR Moves  Access Code: [email or web address] URL: [email or web address] Date: 04/11/2023 Prepared by: Sherlie Ban  Exercises - Staggered Sit-to-Stand (Mirrored)  - 1 x daily - 5 x weekly - 1-2 sets - 5 reps - Staggered Stance Forward Backward Weight Shift with Counter Support  - 1 x daily - 5 x weekly - 2 sets - 10 reps - Standing Marching  - 1 x daily - 5 x weekly - 2 sets - 10 reps - Forward Step Up  - 1 x daily - 5 x weekly - 2 sets - 10 reps - Seated Hamstring Stretch  - 1 x daily - 5 x weekly - 1 sets - 3 reps - 30 sec hold - Alternating Step Backward with Support  - 1 x daily - 5 x weekly - 1-2 sets - 10 reps - Side Stepping with Resistance at Thighs and Counter Support  - 1 x daily - 5 x weekly - 3 sets  GOALS: Goals reviewed with patient? Yes  SHORT TERM GOALS: Target date: 04/14/2023  Pt will be independent with initial HEP for improved balance, posture, functional mobility.    Baseline: Goal status: INITIAL    LONG TERM GOALS: Target date: 05/12/2023  Pt will be independent with final PD specific HEP for improved balance, posture, functional mobility.  Baseline:  Goal status: INITIAL  2.  Pt will improve posterior step and release test to 2 steps or less with independent balance recovery, to demonstrate improved balance recovery in posterior direction.  Baseline: multiple steps, needs to grab onto // bars for balance  Goal status: INITIAL  3.  Pt will improve miniBEST to at least a 21/28 in order to demo decr fall  risk. Baseline: 17/28 Goal status: INITIAL  4.  Pt will improve gait speed with no AD to at least 2.8 ft/sec in order to demo improved community mobility.  Baseline: 12.87 seconds = 2.55 ft/sec, limited community ambulator with no AD Goal status: INITIAL  5.  Pt will be able to ambulate at least 500' outdoors with supervision and bilateral trekking poles for exercising in the community.  Baseline: 250' with SBA with bilateral trekking poles Goal status: IN PROGRESS    ASSESSMENT:  CLINICAL IMPRESSION: Today's skilled session focused on large amplitude stepping reaction tasks with balance on compliant surface, quarter turning exercise with carryover to transfers, and trekking pole trial with indoor and outdoor ambulation. Patient tolerated session well and trekking poles appropriate during today's session but recommend further trial in future to assess appropriateness.  Will continue per POC.   OBJECTIVE IMPAIRMENTS: Abnormal gait, decreased activity tolerance, decreased balance, decreased cognition, decreased coordination, difficulty walking, decreased ROM, decreased strength, impaired flexibility, impaired sensation, and postural dysfunction.   ACTIVITY LIMITATIONS: lifting, bending, stairs, transfers, and locomotion level  PARTICIPATION LIMITATIONS: community activity  PERSONAL FACTORS: Age, Behavior pattern, Past/current experiences, Time since onset of injury/illness/exacerbation, and 3+ comorbidities: PD, MCI, CLL, basal cell carcinoma, Ankle/feet paresthesias, CVA with field cut (09/2022)   are also affecting patient's functional outcome.   REHAB POTENTIAL: Good  CLINICAL DECISION MAKING: Evolving/moderate complexity  EVALUATION COMPLEXITY: Moderate  PLAN:  PT FREQUENCY: 2x/week  PT DURATION: 8 weeks  PLANNED INTERVENTIONS: Therapeutic exercises, Therapeutic activity, Neuromuscular re-education, Balance training, Gait training, Patient/Family education, Self Care, Joint  mobilization, Stair training, Vestibular training, DME instructions, Manual therapy, and Re-evaluation  PLAN FOR NEXT SESSION: L hip strengthening. Balance with vision removed, SLS. TURNING. STEPPING STRATEGIES - esp L lateral and posteriorly. work on walking with trekking poles particular outdoors over curbs, review turning with chair transfers   Carmelia Bake, PT, DPT 04/14/2023, 4:43 PM

## 2023-04-18 ENCOUNTER — Ambulatory Visit: Payer: Medicare Other | Attending: Family Medicine | Admitting: Physical Therapy

## 2023-04-18 ENCOUNTER — Encounter: Payer: Self-pay | Admitting: Physical Therapy

## 2023-04-18 DIAGNOSIS — R2681 Unsteadiness on feet: Secondary | ICD-10-CM | POA: Insufficient documentation

## 2023-04-18 DIAGNOSIS — R2689 Other abnormalities of gait and mobility: Secondary | ICD-10-CM | POA: Diagnosis not present

## 2023-04-18 DIAGNOSIS — R293 Abnormal posture: Secondary | ICD-10-CM | POA: Insufficient documentation

## 2023-04-18 DIAGNOSIS — M6281 Muscle weakness (generalized): Secondary | ICD-10-CM | POA: Insufficient documentation

## 2023-04-18 NOTE — Therapy (Signed)
OUTPATIENT PHYSICAL THERAPY NEURO TREATMENT   Patient Name: Joshua Browning MRN: [id number] DOB:1946-07-02, 77 y.o., male Today's Date: 04/18/2023   PCP: Daisy Floro, MD REFERRING PROVIDER: Tat, Octaviano Batty, DO  END OF SESSION:  PT End of Session - 04/18/23 1447     Visit Number 4    Number of Visits 13    Date for PT Re-Evaluation 06/15/23    Authorization Type [employer] Medicare    PT Start Time 1445    PT Stop Time 1527    PT Time Calculation (min) 42 min    Equipment Utilized During Treatment Gait belt    Activity Tolerance Patient tolerated treatment well    Behavior During Therapy WFL for tasks assessed/performed             Past Medical History:  Diagnosis Date   BPH (benign prostatic hyperplasia)    CVA (cerebral vascular accident) (HCC)    Foley catheter in place    removed after turp   GERD (gastroesophageal reflux disease)    Nocturnal leg cramps    occ   Parkinson's disease    Urinary retention    Wears glasses    Past Surgical History:  Procedure Laterality Date   CATARACT EXTRACTION W/ INTRAOCULAR LENS  IMPLANT, BILATERAL Bilateral    INGUINAL HERNIA REPAIR Bilateral RIGHT x 2 1984 umbilical done with last right   left last one done 10 yrs ago   RE-DO RIGHT INGUINAL HERNIA REPAIR AND UMBILICAL HERNIA REPAIR  2001   REMOVAL LEFT EYE INTRAOCULAR LENS AND REPLACEMENT   08-29-2013   TRANSURETHRAL RESECTION OF PROSTATE N/A 09/23/2013   Procedure: TRANSURETHRAL RESECTION OF THE PROSTATE (TURP) WITH GYRUS;  Surgeon: Garnett Farm, MD;  Location: [facility];  Service: Urology;  Laterality: N/A;   VARICOCELECTOMY Left 07/02/2018   Procedure: VARICOCELECTOMY;  Surgeon: Ihor Gully, MD;  Location: [facility];  Service: Urology;  Laterality: Left;   Patient Active Problem List   Diagnosis Date Noted   H/O: stroke 10/27/2022   Cerebrovascular accident (CVA) (HCC) 09/28/2022   Exertional dyspnea 09/28/2022   Parkinson's  disease without dyskinesia, with fluctuating manifestations 09/09/2022   BPH (benign prostatic hypertrophy) with urinary retention 09/23/2013    ONSET DATE: 03/14/2023   REFERRING DIAG: G20.A1 (ICD-10-CM) - Parkinson's disease without dyskinesia or fluctuating manifestations   THERAPY DIAG:  Unsteadiness on feet  Abnormal posture  Other abnormalities of gait and mobility  Muscle weakness (generalized)  Rationale for Evaluation and Treatment: Rehabilitation  SUBJECTIVE:  SUBJECTIVE STATEMENT: Tried the walking poles and felt a little awkward with the sequencing. Has poles for home, but didn't bring them in.   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 No  PRECAUTIONS: Fall   WEIGHT BEARING RESTRICTIONS: No  FALLS: Has patient fallen in last 6 months? No  LIVING ENVIRONMENT: Lives with: lives with their spouse Lives in: House/apartment Stairs: Yes: Internal: 12 steps; on left going up and External: 4 steps; can reach both Has following equipment at home: Grab bars and trekking poles recommended by Dr. Arbutus Leas, has not bought them but wants to work on them   PLOF: Independent  PATIENT GOALS: Wants to improve his gait/balance, learn to use trekking poles   OBJECTIVE:   DIAGNOSTIC FINDINGS: MRI brain 09/18/22:  IMPRESSION: 1. Recent right occipital and posterior temporal cortex infarct. 2. Subacute lacunar infarct in the deep left cerebral white matter. 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 3. Chronic cervical adenopathy also seen on neck CT from 2022, question low-grade lymphoma such as CLL.  MRI lumbar spine with evidence of left S1  radiculopathy. 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     COGNITION: Overall cognitive status: Dr. Alinda Dooms: Repeat testing in 10/2022 demonstrated MCI again, although potentially a bit worse (but he had had a stroke not long prior)    SENSATION: Light touch: WFL  COORDINATION: Heel to shin: WNL  POSTURE: rounded shoulders, forward head, increased thoracic kyphosis, and posterior pelvic tilt  LOWER EXTREMITY ROM:     Decr AROM of knee extension LLE> RLE   LOWER EXTREMITY MMT:    MMT Right Eval Left Eval  Hip flexion 4+ 4  Hip extension    Hip abduction 4 4  Hip adduction 4+ 4+  Hip internal rotation    Hip external rotation    Knee flexion 4+ 4-  Knee extension 4 4  Ankle dorsiflexion 5 4+  Ankle plantarflexion    Ankle inversion    Ankle eversion    (Blank rows = not tested)  All tested in sitting   BED MOBILITY:  Pt reports independence   TRANSFERS: Assistive device utilized: None  Sit to stand: SBA Stand to sit: SBA  Decr forward lean at times   GAIT: Gait pattern: decreased arm swing- Left, decreased stride length, decreased hip/knee flexion- Right, decreased hip/knee flexion- Left, circumduction- Left, Right foot flat, Left foot flat, trunk flexed, narrow BOS, and poor foot clearance- Left Distance walked: Clinic distances  Assistive device utilized: None Level of assistance: SBA Comments: Pt with LLE circumduction and narrow BOS    FUNCTIONAL TESTS:  5 times sit to stand: 11.59 seconds, last rep decr eccentric control back down to chair with no UE support 10 meter walk test: 12.87 seconds = 2.55 ft/sec, limited community ambulator with no AD     TODAY'S TREATMENT:  There were no vitals filed for this visit.   Gait:  GAIT: Gait pattern: step through pattern,  decreased hip/knee flexion- Right, decreased hip/knee flexion- Left, decreased ankle dorsiflexion- Right, decreased ankle dorsiflexion- Left, poor foot clearance- Right, and poor foot clearance- Left Distance walked: 800' x 1 outdoors total  Assistive device utilized: bilateral trekking poles, single trekking pole in RUE  Level of assistance: SBA  Comments: With bilateral trekking poles, reviewed initial sequencing and placement of poles. Pt more challenged with this today and maintaining reciprocal pattern with 2 point gait pattern. Pt with tendency to perform with a 3 or 4 point gait pattern with poles, and frequently getting off sequence and performing with ipsilateral pattern instead of reciprocal. Pt also looking down at the ground more. Cues to focus primarily on the task of walking, as pt with tendency to converse with therapist and pt would get more off sequence when dual tasking.   Tried single trekking pole in RUE with pt doing much better with sequencing with single pole compared to bilateral, intermittent cues for incr step length with RLE and looking ahead for posture. Pt with no LOB and did have better sequencing and able to re-set when needed without verbal cues. Discussed will need further with bilateral poles after today and to practice with single walking pole when ambulating outdoors with spouse at home.     NMR:   With 4 orange obstacles on level ground for hip flexor strengthening, SLS, foot clearance, performed with step-to pattern and trying to perform without UE support 2 reps leading with RLE, 2 reps leading with LLE, when performing with SLS, cues to hold for a few seconds for SLS before stepping over obstacle  Performed an additional 3 reps leading with RLE,3 reps leading with LLE with 4# ankle weight on both legs, pt much more challenged with this and needing intermittent UE support for balance, pt more challenged with SLS on LLE. Pt did well with LLE foot clearance tasks  and performing with hip flexion instead of circumduction    PATIENT EDUCATION: Education details: Printed out information and waiver sheet for PWR moves classes starting back tomorrow, trying to walk with single walking pole when going on walks outdoors with pt's spouse vs. Both walking poles at this time as it was much harder to coordinate  Person educated: Patient Education method: Explanation, Demonstration, Verbal cues, and Handouts Education comprehension: verbalized understanding and returned demonstration  HOME EXERCISE PROGRAM: Standing PWR Moves  Access Code: [email or web address] URL: [email or web address] Date: 04/11/2023 Prepared by: Sherlie Ban  Exercises - Staggered Sit-to-Stand (Mirrored)  - 1 x daily - 5 x weekly - 1-2 sets - 5 reps - Staggered Stance Forward Backward Weight Shift with Counter Support  - 1 x daily - 5 x weekly - 2 sets - 10 reps - Standing Marching  - 1 x daily - 5 x weekly - 2 sets - 10 reps - Forward Step Up  - 1 x daily - 5 x weekly - 2 sets - 10 reps - Seated Hamstring Stretch  - 1 x daily - 5 x weekly - 1 sets - 3 reps - 30 sec hold - Alternating Step Backward with Support  - 1 x daily - 5 x weekly - 1-2 sets - 10 reps - Side Stepping with Resistance at Thighs and Counter Support  - 1 x daily - 5 x weekly - 3 sets  GOALS: Goals reviewed with patient? Yes  SHORT TERM GOALS: Target date: 04/14/2023  Pt will be independent with initial HEP for improved balance, posture, functional mobility.    Baseline: Goal status: MET     LONG TERM GOALS: Target date: 05/12/2023  Pt will be independent with final PD specific HEP for improved balance, posture, functional mobility.  Baseline:  Goal status: INITIAL  2.  Pt will improve posterior step and release test to 2 steps or less with independent balance recovery, to demonstrate improved balance recovery in posterior direction.  Baseline: multiple steps, needs to grab onto // bars for balance   Goal status: INITIAL  3.  Pt will improve miniBEST to at least a 21/28 in order to demo decr fall risk. Baseline: 17/28 Goal status: INITIAL  4.  Pt will improve gait speed with no AD to at least 2.8 ft/sec in order to demo improved community mobility.  Baseline: 12.87 seconds = 2.55 ft/sec, limited community ambulator with no AD Goal status: INITIAL  5.  Pt will be able to ambulate at least 500' outdoors with supervision and bilateral trekking poles for exercising in the community.  Baseline: 250' with SBA with bilateral trekking poles Goal status: IN PROGRESS    ASSESSMENT:  CLINICAL IMPRESSION: Today's skilled session continued to focus on reviewing gait with bilateral trekking poles on outdoor surfaces. Pt with more difficulty today and had problems with sequencing despite multi-modal cues. Instead, trialed a single trekking pole in RUE, with pt with significantly improved sequencing and able to look ahead when walking for posture instead of down at ground when using two poles. Pt also reporting feeling more steady with single walking pole.Discussed using just one walking pole at this time when ambulating outdoors with spouse as will need continued practice with both. Remainder of session focused on obstacle negotiation and foot clearance, with pt having no episodes of LLE circumduction.  Will continue per POC.   OBJECTIVE IMPAIRMENTS: Abnormal gait, decreased activity tolerance, decreased balance, decreased cognition, decreased coordination, difficulty walking, decreased ROM, decreased strength, impaired flexibility, impaired sensation, and postural dysfunction.   ACTIVITY LIMITATIONS: lifting, bending, stairs, transfers, and locomotion level  PARTICIPATION LIMITATIONS: community activity  PERSONAL FACTORS: Age, Behavior pattern, Past/current experiences, Time since onset of injury/illness/exacerbation, and 3+ comorbidities: PD, MCI, CLL, basal cell carcinoma, Ankle/feet  paresthesias, CVA with field cut (09/2022)   are also affecting patient's functional outcome.   REHAB POTENTIAL: Good  CLINICAL DECISION MAKING: Evolving/moderate complexity  EVALUATION COMPLEXITY: Moderate  PLAN:  PT FREQUENCY: 2x/week  PT DURATION: 8 weeks  PLANNED INTERVENTIONS: Therapeutic exercises, Therapeutic activity, Neuromuscular re-education, Balance training, Gait training, Patient/Family education, Self Care, Joint mobilization, Stair training, Vestibular training, DME instructions, Manual therapy, and Re-evaluation  PLAN FOR NEXT SESSION: L hip strengthening. Balance with vision removed, SLS. TURNING. STEPPING STRATEGIES - esp L lateral and posteriorly. work on walking with trekking poles particular outdoors over curbs (either single waking pole or double), review turning with chair transfers   Drake Leach, PT, DPT 04/18/2023, 3:32 PM

## 2023-04-21 ENCOUNTER — Encounter: Payer: Self-pay | Admitting: Physical Therapy

## 2023-04-21 ENCOUNTER — Ambulatory Visit: Payer: Medicare Other | Admitting: Physical Therapy

## 2023-04-21 VITALS — BP 127/76 | HR 85

## 2023-04-21 DIAGNOSIS — R293 Abnormal posture: Secondary | ICD-10-CM

## 2023-04-21 DIAGNOSIS — R2689 Other abnormalities of gait and mobility: Secondary | ICD-10-CM | POA: Diagnosis not present

## 2023-04-21 DIAGNOSIS — M6281 Muscle weakness (generalized): Secondary | ICD-10-CM

## 2023-04-21 DIAGNOSIS — R2681 Unsteadiness on feet: Secondary | ICD-10-CM | POA: Diagnosis not present

## 2023-04-21 NOTE — Therapy (Signed)
OUTPATIENT PHYSICAL THERAPY NEURO TREATMENT   Patient Name: Joshua Browning MRN: [id number] DOB:1946/07/25, 77 y.o., male Today's Date: 04/21/2023   PCP: Daisy Floro, MD REFERRING PROVIDER: Tat, Octaviano Batty, DO  END OF SESSION:  PT End of Session - 04/21/23 1236     Visit Number 5    Number of Visits 13    Date for PT Re-Evaluation 06/15/23    Authorization Type [employer] Medicare    PT Start Time 1235    PT Stop Time 1315    PT Time Calculation (min) 40 min    Equipment Utilized During Treatment Gait belt    Activity Tolerance Patient tolerated treatment well    Behavior During Therapy WFL for tasks assessed/performed             Past Medical History:  Diagnosis Date   BPH (benign prostatic hyperplasia)    CVA (cerebral vascular accident) (HCC)    Foley catheter in place    removed after turp   GERD (gastroesophageal reflux disease)    Nocturnal leg cramps    occ   Parkinson's disease    Urinary retention    Wears glasses    Past Surgical History:  Procedure Laterality Date   CATARACT EXTRACTION W/ INTRAOCULAR LENS  IMPLANT, BILATERAL Bilateral    INGUINAL HERNIA REPAIR Bilateral RIGHT x 2 1984 umbilical done with last right   left last one done 10 yrs ago   RE-DO RIGHT INGUINAL HERNIA REPAIR AND UMBILICAL HERNIA REPAIR  2001   REMOVAL LEFT EYE INTRAOCULAR LENS AND REPLACEMENT   08-29-2013   TRANSURETHRAL RESECTION OF PROSTATE N/A 09/23/2013   Procedure: TRANSURETHRAL RESECTION OF THE PROSTATE (TURP) WITH GYRUS;  Surgeon: Garnett Farm, MD;  Location: [facility];  Service: Urology;  Laterality: N/A;   VARICOCELECTOMY Left 07/02/2018   Procedure: VARICOCELECTOMY;  Surgeon: Ihor Gully, MD;  Location: [facility];  Service: Urology;  Laterality: Left;   Patient Active Problem List   Diagnosis Date Noted   H/O: stroke 10/27/2022   Cerebrovascular accident (CVA) (HCC) 09/28/2022   Exertional dyspnea 09/28/2022   Parkinson's  disease without dyskinesia, with fluctuating manifestations 09/09/2022   BPH (benign prostatic hypertrophy) with urinary retention 09/23/2013    ONSET DATE: 03/14/2023   REFERRING DIAG: G20.A1 (ICD-10-CM) - Parkinson's disease without dyskinesia or fluctuating manifestations   THERAPY DIAG:  Unsteadiness on feet  Abnormal posture  Other abnormalities of gait and mobility  Muscle weakness (generalized)  Rationale for Evaluation and Treatment: Rehabilitation  SUBJECTIVE:  SUBJECTIVE STATEMENT: Patient reports that he is feeling extra shaky today. States he took his PD medication per usual but doesn't seem to be working as well. Denies falls/near falls. Arrives to session with bilateral trekking poles carrying them in.   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 No  PRECAUTIONS: Fall   WEIGHT BEARING RESTRICTIONS: No  FALLS: Has patient fallen in last 6 months? No  LIVING ENVIRONMENT: Lives with: lives with their spouse Lives in: House/apartment Stairs: Yes: Internal: 12 steps; on left going up and External: 4 steps; can reach both Has following equipment at home: Grab bars and trekking poles recommended by Dr. Arbutus Leas, has not bought them but wants to work on them   PLOF: Independent  PATIENT GOALS: Wants to improve his gait/balance, learn to use trekking poles   OBJECTIVE:   DIAGNOSTIC FINDINGS: MRI brain 09/18/22:  IMPRESSION: 1. Recent right occipital and posterior temporal cortex infarct. 2. Subacute lacunar infarct in the deep left cerebral white matter. 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 3. Chronic cervical adenopathy also seen on  neck CT from 2022, question low-grade lymphoma such as CLL.  MRI lumbar spine with evidence of left S1 radiculopathy. 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     COGNITION: Overall cognitive status: Dr. Alinda Dooms: Repeat testing in 10/2022 demonstrated MCI again, although potentially a bit worse (but he had had a stroke not long prior)    SENSATION: Light touch: WFL  COORDINATION: Heel to shin: WNL  POSTURE: rounded shoulders, forward head, increased thoracic kyphosis, and posterior pelvic tilt  LOWER EXTREMITY ROM:     Decr AROM of knee extension LLE> RLE   LOWER EXTREMITY MMT:    MMT Right Eval Left Eval  Hip flexion 4+ 4  Hip extension    Hip abduction 4 4  Hip adduction 4+ 4+  Hip internal rotation    Hip external rotation    Knee flexion 4+ 4-  Knee extension 4 4  Ankle dorsiflexion 5 4+  Ankle plantarflexion    Ankle inversion    Ankle eversion    (Blank rows = not tested)  All tested in sitting   BED MOBILITY:  Pt reports independence   TRANSFERS: Assistive device utilized: None  Sit to stand: SBA Stand to sit: SBA  Decr forward lean at times   GAIT: Gait pattern: decreased arm swing- Left, decreased stride length, decreased hip/knee flexion- Right, decreased hip/knee flexion- Left, circumduction- Left, Right foot flat, Left foot flat, trunk flexed, narrow BOS, and poor foot clearance- Left Distance walked: Clinic distances  Assistive device utilized: None Level of assistance: SBA Comments: Pt with LLE circumduction and narrow BOS  FUNCTIONAL TESTS:  5 times sit to stand: 11.59 seconds, last rep decr eccentric control back down to chair with no UE support 10 meter walk test: 12.87 seconds = 2.55 ft/sec, limited community ambulator with no AD   TODAY'S TREATMENT:  Vitals:   04/21/23 1241 04/21/23 1242  BP: 107/77 127/76  Pulse:  85   Patient wanting to prioritize walker sequencing in today's session.   Gait:  GAIT: Gait pattern: step through pattern, decreased hip/knee flexion- Right, decreased hip/knee flexion- Left, decreased ankle dorsiflexion- Right, decreased ankle dorsiflexion- Left, poor foot clearance- Right, and poor foot clearance- Left Distance walked: 1 x 130 feet indoors, 1 x 800 feet up and down low grade ramps on side walks Assistive device utilized: trekking (trialed single versus double intermittently)  Level of assistance: SBA Comments: Patient able to sequence and walk with single trekking pole. Patient demonstrates poor sequencing in today's session when attempting to self correct sequencing with two trekking poles, improved safety when educated to focus less on sequencing but still continues to demonstrate increased anxiety around proper coordination   PATIENT EDUCATION: Education details: Continue HEP Person educated: Patient Education method: Programmer, multimedia, Facilities manager, Verbal cues, and Handouts Education comprehension: verbalized understanding and returned demonstration  HOME EXERCISE PROGRAM: Standing PWR Moves  Access Code: [email or web address] URL: [email or web address] Date: 04/11/2023 Prepared by: Sherlie Ban  Exercises - Staggered Sit-to-Stand (Mirrored)  - 1 x daily - 5 x weekly - 1-2 sets - 5 reps - Staggered Stance Forward Backward Weight Shift with Counter Support  - 1 x daily - 5 x weekly - 2 sets - 10 reps - Standing Marching  - 1 x daily - 5 x weekly - 2 sets - 10 reps - Forward Step Up  - 1 x daily - 5 x weekly - 2 sets - 10 reps - Seated Hamstring Stretch  - 1 x daily - 5 x weekly - 1 sets - 3 reps - 30 sec hold - Alternating Step Backward with Support  - 1 x daily - 5 x weekly - 1-2 sets - 10 reps - Side Stepping with Resistance at Thighs and Counter Support  - 1 x daily - 5 x weekly - 3  sets  GOALS: Goals reviewed with patient? Yes  SHORT TERM GOALS: Target date: 04/14/2023   Pt will be independent with initial HEP for improved balance, posture, functional mobility.    Baseline: Goal status: INITIAL    LONG TERM GOALS: Target date: 05/12/2023  Pt will be independent with final PD specific HEP for improved balance, posture, functional mobility.  Baseline:  Goal status: INITIAL  2.  Pt will improve posterior step and release test to 2 steps or less with independent balance recovery, to demonstrate improved balance recovery in posterior direction.  Baseline: multiple steps, needs to grab onto // bars for balance  Goal status: INITIAL  3.  Pt will improve miniBEST to at least a 21/28 in order to demo decr fall risk. Baseline: 17/28 Goal status: INITIAL  4.  Pt will improve gait speed with no AD to at least 2.8 ft/sec in order to demo improved community mobility.  Baseline: 12.87 seconds = 2.55 ft/sec, limited community ambulator with no AD Goal status: INITIAL  5.  Pt will be able to ambulate at least 500' outdoors with supervision and bilateral trekking poles for exercising in the community.  Baseline: 250' with SBA with bilateral trekking poles Goal status: IN PROGRESS    ASSESSMENT:  CLINICAL IMPRESSION: Today's skilled session focused on continued comparison of single versus double trekking pole. Patient able to sequence single trekking pole well but demonstrates poor sequencing with bilateral trekking pole particularly when over thinking sequence pattern. Given difficulty with dual task with double trekking poles, single  trekking pole will likely be more functional but will continue to assess and consider other  AD as needed. Continue POC.   OBJECTIVE IMPAIRMENTS: Abnormal gait, decreased activity tolerance, decreased balance, decreased cognition, decreased coordination, difficulty walking, decreased ROM, decreased strength, impaired flexibility, impaired  sensation, and postural dysfunction.   ACTIVITY LIMITATIONS: lifting, bending, stairs, transfers, and locomotion level  PARTICIPATION LIMITATIONS: community activity  PERSONAL FACTORS: Age, Behavior pattern, Past/current experiences, Time since onset of injury/illness/exacerbation, and 3+ comorbidities: PD, MCI, CLL, basal cell carcinoma, Ankle/feet paresthesias, CVA with field cut (09/2022)   are also affecting patient's functional outcome.   REHAB POTENTIAL: Good  CLINICAL DECISION MAKING: Evolving/moderate complexity  EVALUATION COMPLEXITY: Moderate  PLAN:  PT FREQUENCY: 2x/week  PT DURATION: 8 weeks  PLANNED INTERVENTIONS: Therapeutic exercises, Therapeutic activity, Neuromuscular re-education, Balance training, Gait training, Patient/Family education, Self Care, Joint mobilization, Stair training, Vestibular training, DME instructions, Manual therapy, and Re-evaluation  PLAN FOR NEXT SESSION: L hip strengthening. Balance with vision removed, SLS. TURNING. STEPPING STRATEGIES - esp L lateral and posteriorly. work on walking with trekking poles particular outdoors over curbs, review turning with chair transfers, how was beach trip?   Carmelia Bake, PT, DPT 04/21/2023, 2:51 PM

## 2023-04-25 ENCOUNTER — Ambulatory Visit: Payer: Medicare Other | Admitting: Physical Therapy

## 2023-04-27 ENCOUNTER — Ambulatory Visit: Payer: Medicare Other | Admitting: Neurology

## 2023-04-27 ENCOUNTER — Encounter: Payer: Self-pay | Admitting: Neurology

## 2023-04-27 ENCOUNTER — Other Ambulatory Visit (INDEPENDENT_AMBULATORY_CARE_PROVIDER_SITE_OTHER): Payer: Medicare Other

## 2023-04-27 VITALS — BP 124/78 | HR 64 | Ht 70.0 in | Wt 141.1 lb

## 2023-04-27 DIAGNOSIS — R519 Headache, unspecified: Secondary | ICD-10-CM

## 2023-04-27 DIAGNOSIS — G20A1 Parkinson's disease without dyskinesia, without mention of fluctuations: Secondary | ICD-10-CM | POA: Diagnosis not present

## 2023-04-27 DIAGNOSIS — R251 Tremor, unspecified: Secondary | ICD-10-CM

## 2023-04-27 LAB — SEDIMENTATION RATE: Sed Rate: 1 mm/h (ref 0–20)

## 2023-04-27 MED ORDER — MIRTAZAPINE 15 MG PO TABS
15.0000 mg | ORAL_TABLET | Freq: Every day | ORAL | 1 refills | Status: DC
Start: 2023-04-27 — End: 2023-10-26

## 2023-04-27 NOTE — Progress Notes (Signed)
Assessment/Plan:   1.  Parkinsons Disease   -Continue carbidopa/levodopa 25/100, 2 tablets 3 times per day.    -Continue carbidopa/levodopa 50/200 at bedtime  -Continue clonazepam 0.5 mg, half tablet at bedtime for cramping and sleep.  Start taking that 30 min before bedtime to get it to kick in when gets into bed.  -He is working on utilizing trekking poles   2.  MCI   -Patient had neurocognitive testing with Dr. Alinda Dooms in 2017.  Repeat testing in 10/2022 demonstrated MCI again, although potentially a bit worse (but he had had a stroke not long prior).  Wife notes memory struggles but discussed that AChI are not for MCI.  We discussed the newer generation medications for Alzheimer's, but I do not think he has Alzheimer's.  We did discuss lumbar puncture for Alzheimer's and the newer medicines for this, but he is not interested in those, nor did I recommend them.  -OT driving eval in 10/863 at [facility] was good   3.  Mild depression/insomnia  -Doing well with mirtazapine, 15 mg at bedtime.  Refill today.  4.  B12 deficiency  -On oral supplementation  -recheck level as has been few years  5.  Diplopia  -This was due to dislocated intraocular lens implant.  Resolved after sx  6.  CLL  -Diagnosed September, 2022.  -Following with oncology.  7.  Basal cell carcinoma  -Following with dermatology.  8.  Weight loss  -Patient previously with extensive negative workup.              -he is already taking in extra protein  -not due to Parkinsons Disease  -weight is stable today             -on mirtazapine from me, which should, in theory, cause weight gain  9.  Ankle/feet paresthesias  -EMG negative for large fiber neuropathy.  -MRI lumbar spine with evidence of left S1 radiculopathy.  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  Patient without back pain.  10.  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  -Talked about importance of blood pressure control with a goal <130/80 mm Hg.   - Goal LDL less than 70.  On Lipitor and they report that they are at goal now.  11.  Headache  -possible ice pick headache  -resolved now  -check esr and make sure not over 100    Subjective:   Joshua Browning was seen today in follow up for Parkinsons disease.  My previous records were reviewed prior to todays visit as well as outside records available to me.  Pt with wife who supplements hx. patient was last seen about 6 weeks ago.  Patient seen as a work in today.  I did not change any of his medications last visit.  Shortly after last visit  he went to the emergency room with a headache.  Notes are reviewed.  He states that he woke up in the middle of the night with 3 fast but painful pains over the R temporal region (rates 7/10).  It scared him b/c it was "over the area of my stroke."  He had a few more minor episodes in the morning after he woke up.   The headache was described in the emergency room records as a sharp shooting sensation.  On Monday, he went to the a provider at his pcp.  On wed, he went to [employer] class and he was more unsteady on the way into the class.  He had some trouble keeping up in the class but he noted their was a Technical brewer and felt that this may be the reason.  He felt a little foggy and called the PCP, and they instructed him to go to the ER.    Emergency room records stated that "headache is mild but it does concern him."  Pt states it "wasn't a headache but a sharp pain."  By the time he left the ER, he was clear and it was ok and it has not recurred since.   CT brain was unremarkable for anything acute.  Wife notes continued memory loss.   Current prescribed movement disorder medications: Carbidopa/levodopa 25/100, 2 tablets 3 times per day  Carbidopa/levodopa 50/200 at bedtime Mirtazapine, 15 mg at bedtime Clonazepam 0.5  mg, half a tablet at bedtime  B12 supplement    ALLERGIES:  No Known Allergies  CURRENT MEDICATIONS:  Outpatient Encounter Medications as of 04/27/2023  Medication Sig   acetaminophen (TYLENOL) 500 MG tablet Take 1 tablet (500 mg total) by mouth every 6 (six) hours as needed.   aspirin EC 81 MG tablet Take 81 mg by mouth daily. Swallow whole.   atorvastatin (LIPITOR) 20 MG tablet Take 1 tablet (20 mg total) by mouth daily.   carbidopa-levodopa (SINEMET CR) 50-200 MG tablet TAKE 1 TABLET BY MOUTH EVERYDAY AT BEDTIME   carbidopa-levodopa (SINEMET IR) 25-100 MG tablet TAKE 2 TABLETS BY MOUTH 3 TIMES DAILY.   Cholecalciferol (VITAMIN D3) 1000 UNITS CAPS Take 1 capsule by mouth every evening.   clonazePAM (KLONOPIN) 0.5 MG tablet TAKE 1/2 TABLET BY MOUTH EVERY DAY AT BEDTIME   cyanocobalamin 1000 MCG tablet Take 1,000 mcg by mouth daily.   famotidine (PEPCID) 20 MG tablet Take 20 mg by mouth at bedtime. Takes pepcid ac   Glucosamine Sulfate 1000 MG CAPS Take by mouth daily.    Magnesium 100 MG CAPS Take 1 capsule by mouth every evening.   mirtazapine (REMERON) 15 MG tablet TAKE 1 TABLET BY MOUTH EVERYDAY AT BEDTIME   pneumococcal 20-valent conjugate vaccine (PREVNAR 20) 0.5 ML injection Inject 0.5 mLs into the muscle.   Potassium 99 MG TABS Take by mouth daily.   [DISCONTINUED] gabapentin (NEURONTIN) 100 MG capsule Take 1 capsule (100 mg total) by mouth 3 (three) times daily as needed (headache). (Patient not taking: Reported on 04/27/2023)   No facility-administered encounter medications on file as of 04/27/2023.    Objective:   PHYSICAL EXAMINATION:    VITALS:   Vitals:   04/27/23 1253  BP: 124/78  Pulse: 64  SpO2: 98%  Weight: 141 lb 1.6 oz (64 kg)  Height: 5\' 10"  (1.778 m)   Wt Readings from Last 3 Encounters:  04/27/23 141 lb 1.6 oz (64 kg)  03/29/23 148 lb (67.1 kg)  03/14/23 142 lb (64.4 kg)   GEN:  The patient appears stated age and is in NAD. HEENT:  Normocephalic,  atraumatic.  The mucous membranes are moist.  Cv:  rrr Lungs:  ctab   Neurological examination:   Orientation: The patient is alert and oriented x3.  Supplies own hx without trouble Cranial nerves: There is good facial symmetry with minimal facial hypomimia. The speech is fluent and clear. Soft palate rises symmetrically and there is no tongue deviation. Hearing is intact to conversational tone. Sensation: Sensation is intact to light touch throughout.  Motor: Strength is at least antigravity x4.     Movement examination: Tone: There is nl tone in the bilateral UE Abnormal movements: no tremor today Coordination:  There is no decremation with any form of RAMS, including alternating supination and pronation of the forearm, hand opening and closing, finger taps, heel taps and toe taps.  Gait and Station: The patient arises without using his hands.  He is just slightly forward flexed today.  Pt walks well today  I have reviewed and interpreted the following labs independently    Chemistry      Component Value Date/Time   NA 143 03/29/2023 1505   K 4.0 03/29/2023 1505   CL 106 03/29/2023 1505   CO2 29 03/29/2023 1505   BUN 18 03/29/2023 1505   CREATININE 0.94 03/29/2023 1505   CREATININE 0.89 05/13/2021 1512   CREATININE 0.86 06/25/2019 1049      Component Value Date/Time   CALCIUM 9.6 03/29/2023 1505   ALKPHOS 46 09/09/2022 0900   AST 15 09/09/2022 0900   AST 16 05/13/2021 1512   ALT 4 09/09/2022 0900   ALT 5 05/13/2021 1512   BILITOT 0.6 09/09/2022 0900   BILITOT 0.5 05/13/2021 1512       Lab Results  Component Value Date   WBC 8.7 03/29/2023   HGB 13.8 03/29/2023   HCT 41.6 03/29/2023   MCV 94.3 03/29/2023   PLT 156 03/29/2023    Lab Results  Component Value Date   TSH 1.49 06/25/2019   Lab Results  Component Value Date   VITAMINB12 >1500 (H) 09/09/2022      Total time spent on today's visit was 40 minutes, including both face-to-face time and  nonface-to-face time.  Time included that spent on review of records (prior notes available to me/labs/imaging if pertinent), discussing treatment and goals, answering patient's questions and coordinating care.  Cc:  Daisy Floro, MD

## 2023-04-27 NOTE — Patient Instructions (Addendum)
Your provider has requested that you have labwork completed today. The lab is located on the Second floor at [address], within the [facility] Endocrinology office. When you get off the elevator, turn right and go in the [facility] Endocrinology [address]; the first brown door on the left.  Tell the ladies behind the desk that you are there for lab work. If you are not called within 15 minutes please check with the front desk.   Once you complete your labs you are free to go. You will receive a call or message via MyChart with your lab results.     SAVE THE DATE!  We are planning a Parkinsons Disease educational symposium at [facility] in [place] on October 11.  More details to come!  If you would like to be added to our email list to get further information, email [email or web address].  To sign up, you can email [email or web address].  We hope to see you there!  Your provider has requested that you have labwork completed today. The lab is located on the Second floor at [address], within the [facility] Endocrinology office. When you get off the elevator, turn right and go in the [facility] Endocrinology [address]; the first brown door on the left.  Tell the ladies behind the desk that you are there for lab work. If you are not called within 15 minutes please check with the front desk.   Once you complete your labs you are free to go. You will receive a call or message via MyChart with your lab results.

## 2023-04-28 ENCOUNTER — Encounter: Payer: Self-pay | Admitting: Physical Therapy

## 2023-04-28 ENCOUNTER — Ambulatory Visit: Payer: Medicare Other | Admitting: Physical Therapy

## 2023-04-28 VITALS — BP 124/78 | HR 64

## 2023-04-28 DIAGNOSIS — M6281 Muscle weakness (generalized): Secondary | ICD-10-CM

## 2023-04-28 DIAGNOSIS — R293 Abnormal posture: Secondary | ICD-10-CM | POA: Diagnosis not present

## 2023-04-28 DIAGNOSIS — R2689 Other abnormalities of gait and mobility: Secondary | ICD-10-CM | POA: Diagnosis not present

## 2023-04-28 DIAGNOSIS — R2681 Unsteadiness on feet: Secondary | ICD-10-CM | POA: Diagnosis not present

## 2023-04-28 NOTE — Therapy (Signed)
OUTPATIENT PHYSICAL THERAPY NEURO TREATMENT   Patient Name: Joshua Browning MRN: [id number] DOB:28-Apr-1946, 77 y.o., male Today's Date: 04/28/2023   PCP: Daisy Floro, MD REFERRING PROVIDER: Tat, Octaviano Batty, DO  END OF SESSION:  PT End of Session - 04/28/23 1152     Visit Number 6    Number of Visits 13    Date for PT Re-Evaluation 06/15/23    Authorization Type [employer] Medicare    PT Start Time 1147    PT Stop Time 1230    PT Time Calculation (min) 43 min    Equipment Utilized During Treatment Gait belt    Activity Tolerance Patient tolerated treatment well             Past Medical History:  Diagnosis Date   BPH (benign prostatic hyperplasia)    CVA (cerebral vascular accident) (HCC)    Foley catheter in place    removed after turp   GERD (gastroesophageal reflux disease)    Nocturnal leg cramps    occ   Parkinson's disease    Urinary retention    Wears glasses    Past Surgical History:  Procedure Laterality Date   CATARACT EXTRACTION W/ INTRAOCULAR LENS  IMPLANT, BILATERAL Bilateral    INGUINAL HERNIA REPAIR Bilateral RIGHT x 2 1984 umbilical done with last right   left last one done 10 yrs ago   RE-DO RIGHT INGUINAL HERNIA REPAIR AND UMBILICAL HERNIA REPAIR  2001   REMOVAL LEFT EYE INTRAOCULAR LENS AND REPLACEMENT   08-29-2013   TRANSURETHRAL RESECTION OF PROSTATE N/A 09/23/2013   Procedure: TRANSURETHRAL RESECTION OF THE PROSTATE (TURP) WITH GYRUS;  Surgeon: Garnett Farm, MD;  Location: [facility] [place];  Service: Urology;  Laterality: N/A;   VARICOCELECTOMY Left 07/02/2018   Procedure: VARICOCELECTOMY;  Surgeon: Ihor Gully, MD;  Location: [facility];  Service: Urology;  Laterality: Left;   Patient Active Problem List   Diagnosis Date Noted   H/O: stroke 10/27/2022   Cerebrovascular accident (CVA) (HCC) 09/28/2022   Exertional dyspnea 09/28/2022   Parkinson's disease without dyskinesia, with fluctuating manifestations  09/09/2022   BPH (benign prostatic hypertrophy) with urinary retention 09/23/2013    ONSET DATE: 03/14/2023   REFERRING DIAG: G20.A1 (ICD-10-CM) - Parkinson's disease without dyskinesia or fluctuating manifestations   THERAPY DIAG:  Unsteadiness on feet  Muscle weakness (generalized)  Rationale for Evaluation and Treatment: Rehabilitation  SUBJECTIVE:  SUBJECTIVE STATEMENT: Patient arrives to session with 2 walking sticks and reporting some difficulty navigating with trekking poles. Denies falls/near falls. States having a hard time getting the sequence.   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 No  PRECAUTIONS: Fall   WEIGHT BEARING RESTRICTIONS: No  FALLS: Has patient fallen in last 6 months? No  LIVING ENVIRONMENT: Lives with: lives with their spouse Lives in: House/apartment Stairs: Yes: Internal: 12 steps; on left going up and External: 4 steps; can reach both Has following equipment at home: Grab bars and trekking poles recommended by Dr. Arbutus Leas, has not bought them but wants to work on them   PLOF: Independent  PATIENT GOALS: Wants to improve his gait/balance, learn to use trekking poles   OBJECTIVE:   DIAGNOSTIC FINDINGS: MRI brain 09/18/22:  IMPRESSION: 1. Recent right occipital and posterior temporal cortex infarct. 2. Subacute lacunar infarct in the deep left cerebral white matter. 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 3. Chronic cervical adenopathy also seen on neck CT from 2022, question low-grade lymphoma such as CLL.  MRI lumbar spine with evidence of left S1 radiculopathy. 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     COGNITION: Overall cognitive status: Dr. Alinda Dooms: Repeat testing in 10/2022 demonstrated MCI again, although potentially a bit worse (but he had had a stroke not long prior)    SENSATION: Light touch: WFL  COORDINATION: Heel to shin: WNL  POSTURE: rounded shoulders, forward head, increased thoracic kyphosis, and posterior pelvic tilt  LOWER EXTREMITY ROM:     Decr AROM of knee extension LLE> RLE   LOWER EXTREMITY MMT:    MMT Right Eval Left Eval  Hip flexion 4+ 4  Hip extension    Hip abduction 4 4  Hip adduction 4+ 4+  Hip internal rotation    Hip external rotation    Knee flexion 4+ 4-  Knee extension 4 4  Ankle dorsiflexion 5 4+  Ankle plantarflexion    Ankle inversion    Ankle eversion    (Blank rows = not tested)  All tested in sitting   BED MOBILITY:  Pt reports independence   TRANSFERS: Assistive device utilized: None  Sit to stand: SBA Stand to sit: SBA  Decr forward lean at times   GAIT: Gait pattern: decreased arm swing- Left, decreased stride length, decreased hip/knee flexion- Right, decreased hip/knee flexion- Left, circumduction- Left, Right foot flat, Left foot flat, trunk flexed, narrow BOS, and poor foot clearance- Left Distance walked: Clinic distances  Assistive device utilized: None Level of assistance: SBA Comments: Pt with LLE circumduction and narrow BOS  FUNCTIONAL TESTS:  5 times sit to stand: 11.59 seconds, last rep decr eccentric control back down to chair with no UE support 10 meter walk test: 12.87 seconds = 2.55 ft/sec, limited community ambulator with no AD   TODAY'S TREATMENT:  Vitals:   04/28/23 1156  BP: 124/78  Pulse: 64   Gait:  GAIT: Gait pattern: step through pattern, decreased hip/knee flexion- Right, decreased hip/knee  flexion- Left, decreased ankle dorsiflexion- Right, and decreased ankle dorsiflexion- Left Distance walked: 2 x 230 feet Assistive device utilized: rollator Level of assistance: SBA Comments: Trialed rollator as patient demonstrates ongoing difficulties with sequencing trekking poles likely due to congitive changes; with ease of rollator patient demonstrates improved upright posture and increased pace of walking, demonstrates appropriate understanding of use of breaks with minimal education during session  NMR: [employer] navigation weaving with rollator with start and finish in chair for safety with transfers and complex environments 2 x 20 feet (SBA) [employer] navigation weaving without AD with start and finish in chair for safety with transfers and complex environments 2 x 20 feet (SBA-CGA) Improved amplitude of steps with turns noted from prior session, appropriate use of rollator breaks, and patient fully turning to chair with transfer  Between // bars with mix of CGA-minA  Large amplitude reaching with cross body reaching standing on rocker board with beanbag toss to airex pad 1 x 8 each (increased challege with posterior LOB) EC on rocker board 5 x 5-20" trials (tendency towards posterior LOB, but demonstrates appropriate ankle strategy intermittently)   PATIENT EDUCATION: Education details: Continue HEP Person educated: Patient Education method: Programmer, multimedia, Demonstration, Verbal cues, and Handouts Education comprehension: verbalized understanding and returned demonstration  HOME EXERCISE PROGRAM: Standing PWR Moves  Access Code: [email or web address] URL: [email or web address] Date: 04/11/2023 Prepared by: Sherlie Ban  Exercises - Staggered Sit-to-Stand (Mirrored)  - 1 x daily - 5 x weekly - 1-2 sets - 5 reps - Staggered Stance Forward Backward Weight Shift with Counter Support  - 1 x daily - 5 x weekly - 2 sets - 10 reps - Standing Marching  - 1 x daily - 5 x weekly - 2 sets - 10  reps - Forward Step Up  - 1 x daily - 5 x weekly - 2 sets - 10 reps - Seated Hamstring Stretch  - 1 x daily - 5 x weekly - 1 sets - 3 reps - 30 sec hold - Alternating Step Backward with Support  - 1 x daily - 5 x weekly - 1-2 sets - 10 reps - Side Stepping with Resistance at Thighs and Counter Support  - 1 x daily - 5 x weekly - 3 sets  GOALS: Goals reviewed with patient? Yes  SHORT TERM GOALS: Target date: 04/14/2023   Pt will be independent with initial HEP for improved balance, posture, functional mobility.    Baseline: Goal status: INITIAL    LONG TERM GOALS: Target date: 05/12/2023  Pt will be independent with final PD specific HEP for improved balance, posture, functional mobility.  Baseline:  Goal status: INITIAL  2.  Pt will improve posterior step and release test to 2 steps or less with independent balance recovery, to demonstrate improved balance recovery in posterior direction.  Baseline: multiple steps, needs to grab onto // bars for balance  Goal status: INITIAL  3.  Pt will improve miniBEST to at least a 21/28 in order to demo decr fall risk. Baseline: 17/28 Goal status: INITIAL  4.  Pt will improve gait speed with no AD to at least 2.8 ft/sec in order to demo improved community mobility.  Baseline: 12.87 seconds = 2.55 ft/sec, limited community ambulator with no AD Goal status: INITIAL  5.  Pt will be able to ambulate at least  500' outdoors with supervision and bilateral trekking poles for exercising in the community.  Baseline: 250' with SBA with bilateral trekking poles Goal status: IN PROGRESS    ASSESSMENT:  CLINICAL IMPRESSION: Patient demonstrates ongoing difficulty sequencing trekking poles walking into session and is not using them outside of PT but carries them in connected; when prompted to use a single pole, patient pauses for ~60 seconds to try to recall sequence pattern and stil cannot appropriately sequence and pauses repeatedly to reset. Given  extent of difficulty sequencing despite multiple sessions trialing, recommend rollator trial set to appropriate height to minimize postural compensation. Patient demonstrates improved fluidity of gait and appropriate use of beaks by end of session recommend further trial in outdoor/complex environment to determine appropriateness. Discussed with patient and spouse briefly at end of session though they have ongoing concern that will impair patient's posture further. Therapist educated on how proper height setting and education on distance from body would help minimize. Ended session with large amplitude reaching task on rocker baord for anterior posterior balance challenge. Patient completes with CGA-minA. Continue POC.   OBJECTIVE IMPAIRMENTS: Abnormal gait, decreased activity tolerance, decreased balance, decreased cognition, decreased coordination, difficulty walking, decreased ROM, decreased strength, impaired flexibility, impaired sensation, and postural dysfunction.   ACTIVITY LIMITATIONS: lifting, bending, stairs, transfers, and locomotion level  PARTICIPATION LIMITATIONS: community activity  PERSONAL FACTORS: Age, Behavior pattern, Past/current experiences, Time since onset of injury/illness/exacerbation, and 3+ comorbidities: PD, MCI, CLL, basal cell carcinoma, Ankle/feet paresthesias, CVA with field cut (09/2022)   are also affecting patient's functional outcome.   REHAB POTENTIAL: Good  CLINICAL DECISION MAKING: Evolving/moderate complexity  EVALUATION COMPLEXITY: Moderate  PLAN:  PT FREQUENCY: 2x/week  PT DURATION: 8 weeks  PLANNED INTERVENTIONS: Therapeutic exercises, Therapeutic activity, Neuromuscular re-education, Balance training, Gait training, Patient/Family education, Self Care, Joint mobilization, Stair training, Vestibular training, DME instructions, Manual therapy, and Re-evaluation  PLAN FOR NEXT SESSION: L hip strengthening. Balance with vision removed, SLS. TURNING.  STEPPING STRATEGIES - esp L lateral and posteriorly, rollator versus trekking poles for gait   Carmelia Bake, PT, DPT 04/28/2023, 1:48 PM

## 2023-05-02 ENCOUNTER — Ambulatory Visit: Payer: Medicare Other | Admitting: Physical Therapy

## 2023-05-02 ENCOUNTER — Encounter: Payer: Self-pay | Admitting: Physical Therapy

## 2023-05-02 DIAGNOSIS — R2689 Other abnormalities of gait and mobility: Secondary | ICD-10-CM

## 2023-05-02 DIAGNOSIS — R2681 Unsteadiness on feet: Secondary | ICD-10-CM | POA: Diagnosis not present

## 2023-05-02 DIAGNOSIS — R293 Abnormal posture: Secondary | ICD-10-CM | POA: Diagnosis not present

## 2023-05-02 DIAGNOSIS — M6281 Muscle weakness (generalized): Secondary | ICD-10-CM | POA: Diagnosis not present

## 2023-05-02 NOTE — Therapy (Signed)
OUTPATIENT PHYSICAL THERAPY NEURO TREATMENT   Patient Name: Joshua Browning MRN: [id number] DOB:01-20-1946, 77 y.o., male Today's Date: 05/02/2023   PCP: Daisy Floro, MD REFERRING PROVIDER: Tat, Octaviano Batty, DO  END OF SESSION:  PT End of Session - 05/02/23 1156     Visit Number 7    Number of Visits 13    Date for PT Re-Evaluation 06/15/23    Authorization Type [employer] Medicare    PT Start Time 1154   pt was not checked in on time   PT Stop Time 1234    PT Time Calculation (min) 40 min    Equipment Utilized During Treatment Gait belt    Activity Tolerance Patient tolerated treatment well    Behavior During Therapy WFL for tasks assessed/performed             Past Medical History:  Diagnosis Date   BPH (benign prostatic hyperplasia)    CVA (cerebral vascular accident) (HCC)    Foley catheter in place    removed after turp   GERD (gastroesophageal reflux disease)    Nocturnal leg cramps    occ   Parkinson's disease    Urinary retention    Wears glasses    Past Surgical History:  Procedure Laterality Date   CATARACT EXTRACTION W/ INTRAOCULAR LENS  IMPLANT, BILATERAL Bilateral    INGUINAL HERNIA REPAIR Bilateral RIGHT x 2 1984 umbilical done with last right   left last one done 10 yrs ago   RE-DO RIGHT INGUINAL HERNIA REPAIR AND UMBILICAL HERNIA REPAIR  2001   REMOVAL LEFT EYE INTRAOCULAR LENS AND REPLACEMENT   08-29-2013   TRANSURETHRAL RESECTION OF PROSTATE N/A 09/23/2013   Procedure: TRANSURETHRAL RESECTION OF THE PROSTATE (TURP) WITH GYRUS;  Surgeon: Garnett Farm, MD;  Location: [facility] [place];  Service: Urology;  Laterality: N/A;   VARICOCELECTOMY Left 07/02/2018   Procedure: VARICOCELECTOMY;  Surgeon: Ihor Gully, MD;  Location: [facility];  Service: Urology;  Laterality: Left;   Patient Active Problem List   Diagnosis Date Noted   H/O: stroke 10/27/2022   Cerebrovascular accident (CVA) (HCC) 09/28/2022   Exertional dyspnea  09/28/2022   Parkinson's disease without dyskinesia, with fluctuating manifestations 09/09/2022   BPH (benign prostatic hypertrophy) with urinary retention 09/23/2013    ONSET DATE: 03/14/2023   REFERRING DIAG: G20.A1 (ICD-10-CM) - Parkinson's disease without dyskinesia or fluctuating manifestations   THERAPY DIAG:  Unsteadiness on feet  Muscle weakness (generalized)  Abnormal posture  Other abnormalities of gait and mobility  Rationale for Evaluation and Treatment: Rehabilitation  SUBJECTIVE:  SUBJECTIVE STATEMENT: Nothing new since he was last here. No falls. Did enjoy the rollator last time.   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 No  PRECAUTIONS: Fall   WEIGHT BEARING RESTRICTIONS: No  FALLS: Has patient fallen in last 6 months? No  LIVING ENVIRONMENT: Lives with: lives with their spouse Lives in: House/apartment Stairs: Yes: Internal: 12 steps; on left going up and External: 4 steps; can reach both Has following equipment at home: Grab bars and trekking poles recommended by Dr. Arbutus Leas, has not bought them but wants to work on them   PLOF: Independent  PATIENT GOALS: Wants to improve his gait/balance, learn to use trekking poles   OBJECTIVE:   DIAGNOSTIC FINDINGS: MRI brain 09/18/22:  IMPRESSION: 1. Recent right occipital and posterior temporal cortex infarct. 2. Subacute lacunar infarct in the deep left cerebral white matter. 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 3. Chronic cervical adenopathy also seen on neck CT from 2022, question low-grade lymphoma such as CLL.  MRI lumbar spine with evidence of left S1  radiculopathy. 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     COGNITION: Overall cognitive status: Dr. Alinda Dooms: Repeat testing in 10/2022 demonstrated MCI again, although potentially a bit worse (but he had had a stroke not long prior)    SENSATION: Light touch: WFL  COORDINATION: Heel to shin: WNL  POSTURE: rounded shoulders, forward head, increased thoracic kyphosis, and posterior pelvic tilt  LOWER EXTREMITY ROM:     Decr AROM of knee extension LLE> RLE   LOWER EXTREMITY MMT:    MMT Right Eval Left Eval  Hip flexion 4+ 4  Hip extension    Hip abduction 4 4  Hip adduction 4+ 4+  Hip internal rotation    Hip external rotation    Knee flexion 4+ 4-  Knee extension 4 4  Ankle dorsiflexion 5 4+  Ankle plantarflexion    Ankle inversion    Ankle eversion    (Blank rows = not tested)  All tested in sitting   BED MOBILITY:  Pt reports independence   TRANSFERS: Assistive device utilized: None  Sit to stand: SBA Stand to sit: SBA  Decr forward lean at times   GAIT: Gait pattern: decreased arm swing- Left, decreased stride length, decreased hip/knee flexion- Right, decreased hip/knee flexion- Left, circumduction- Left, Right foot flat, Left foot flat, trunk flexed, narrow BOS, and poor foot clearance- Left Distance walked: Clinic distances  Assistive device utilized: None Level of assistance: SBA Comments: Pt with LLE circumduction and narrow BOS  FUNCTIONAL TESTS:  5 times sit to stand: 11.59 seconds, last rep decr eccentric control back down to chair with no UE support 10 meter walk test: 12.87 seconds = 2.55 ft/sec, limited community ambulator with no AD   TODAY'S TREATMENT:  There were no vitals filed for this visit.  Gait:  GAIT: Gait pattern: step through pattern, decreased  hip/knee flexion- Right, decreased hip/knee flexion- Left, decreased ankle dorsiflexion- Right, and decreased ankle dorsiflexion- Left, circumduction with LLE  Distance walked: Clinic distances  Assistive device utilized: None Level of assistance: SBA Comments:In and out of session and clinic distances with no AD   Therapeutic Exercise: SciFit with BUE/BLE at gear 4.0 for 8 minutes for reciprocal movement patterns, ROM, neural priming, incr amplitude of stepping. Educated on RPE and purpose of moderate intensity exercise with PD and aerobic exercise. Pt reporting RPE 6-7/10    NMR: With 5 blaze pods on mirror on random hit setting for lateral stepping, posture, weight shifting, visual scanning, reaction times, with pt standing on blue foam beam for compliant surface. Had pt alternate between hands when tapping 3 bouts of 1 minute: 17 hits, 14 hits, 23 hits 1 bout of 1 minute with cog dual task with pt naming animals: 14 hits, pt more challenged with cog dual task  Pt intermittently losing balance posteriorly at times and regains by using a stepping strategy or grabbing onto bar.  On blue foam beam: Alternating posterior step and weight shift x10 reps each leg, progressed difficulty by PT putting blue resistance band around pt's pelvis and giving resistance in the posterior direction, pt then having to control forward step back onto beam. Pt needing UE support at first to regain balance, but then progressing to none and then when pt would get back on the beam, would take a reactive posterior step to maintain balance, performed an additional x15 reps each side  10 reps sit <> stands from elevated mat surface with PWR Up in standing and holding for 5 seconds, performed from a lower surface for an additional 5 reps, progressed difficulty for an additional 5 reps with PT student providing resistance posteriorly with blue resistance band, pt with much more difficulty with this in keeping balance, but  improved with incr practice    PATIENT EDUCATION: Education details: Continue HEP, can try rollator outside at next session if weather permits  Person educated: Patient Education method: Explanation Education comprehension: verbalized understanding  HOME EXERCISE PROGRAM: Standing PWR Moves  Access Code: [email or web address] URL: [email or web address] Date: 04/11/2023 Prepared by: Sherlie Ban  Exercises - Staggered Sit-to-Stand (Mirrored)  - 1 x daily - 5 x weekly - 1-2 sets - 5 reps - Staggered Stance Forward Backward Weight Shift with Counter Support  - 1 x daily - 5 x weekly - 2 sets - 10 reps - Standing Marching  - 1 x daily - 5 x weekly - 2 sets - 10 reps - Forward Step Up  - 1 x daily - 5 x weekly - 2 sets - 10 reps - Seated Hamstring Stretch  - 1 x daily - 5 x weekly - 1 sets - 3 reps - 30 sec hold - Alternating Step Backward with Support  - 1 x daily - 5 x weekly - 1-2 sets - 10 reps - Side Stepping with Resistance at Thighs and Counter Support  - 1 x daily - 5 x weekly - 3 sets  GOALS: Goals reviewed with patient? Yes  SHORT TERM GOALS: Target date: 04/14/2023   Pt will be independent with initial HEP for improved balance, posture, functional mobility.    Baseline: Goal status: MET   LONG TERM GOALS: Target date: 05/12/2023  Pt will be independent with final PD specific HEP for improved balance, posture,  functional mobility.  Baseline:  Goal status: INITIAL  2.  Pt will improve posterior step and release test to 2 steps or less with independent balance recovery, to demonstrate improved balance recovery in posterior direction.  Baseline: multiple steps, needs to grab onto // bars for balance  Goal status: INITIAL  3.  Pt will improve miniBEST to at least a 21/28 in order to demo decr fall risk. Baseline: 17/28 Goal status: INITIAL  4.  Pt will improve gait speed with no AD to at least 2.8 ft/sec in order to demo improved community mobility.  Baseline:  12.87 seconds = 2.55 ft/sec, limited community ambulator with no AD Goal status: INITIAL  5.  Pt will be able to ambulate at least 500' outdoors with supervision and bilateral trekking poles for exercising in the community.  Baseline: 250' with SBA with bilateral trekking poles Goal status: IN PROGRESS    ASSESSMENT:  CLINICAL IMPRESSION: Weather was rainy today, did not get to continue practice with rollator on unlevel outdoor surfaces. Will perform at future session when the weather is better. Today's skilled session focused on SciFit for neural priming and reciprocal movement patterns, balance tasks on compliant surfaces with sit <> stands, weight shifting, and posterior stepping. Pt challenged with blaze pod task when adding cognitive dual task. Will continue per POC.   OBJECTIVE IMPAIRMENTS: Abnormal gait, decreased activity tolerance, decreased balance, decreased cognition, decreased coordination, difficulty walking, decreased ROM, decreased strength, impaired flexibility, impaired sensation, and postural dysfunction.   ACTIVITY LIMITATIONS: lifting, bending, stairs, transfers, and locomotion level  PARTICIPATION LIMITATIONS: community activity  PERSONAL FACTORS: Age, Behavior pattern, Past/current experiences, Time since onset of injury/illness/exacerbation, and 3+ comorbidities: PD, MCI, CLL, basal cell carcinoma, Ankle/feet paresthesias, CVA with field cut (09/2022)   are also affecting patient's functional outcome.   REHAB POTENTIAL: Good  CLINICAL DECISION MAKING: Evolving/moderate complexity  EVALUATION COMPLEXITY: Moderate  PLAN:  PT FREQUENCY: 2x/week  PT DURATION: 8 weeks  PLANNED INTERVENTIONS: Therapeutic exercises, Therapeutic activity, Neuromuscular re-education, Balance training, Gait training, Patient/Family education, Self Care, Joint mobilization, Stair training, Vestibular training, DME instructions, Manual therapy, and Re-evaluation  PLAN FOR NEXT SESSION:  L hip strengthening. Balance with vision removed, SLS. TURNING. STEPPING STRATEGIES - esp L lateral and posteriorly, rollator versus trekking poles for gait (try rollator if weather is nice)   Drake Leach, PT, DPT 05/02/2023, 1:03 PM

## 2023-05-05 ENCOUNTER — Ambulatory Visit: Payer: Medicare Other | Admitting: Physical Therapy

## 2023-05-05 ENCOUNTER — Encounter: Payer: Self-pay | Admitting: Physical Therapy

## 2023-05-05 VITALS — BP 154/83 | HR 66

## 2023-05-05 DIAGNOSIS — R2681 Unsteadiness on feet: Secondary | ICD-10-CM

## 2023-05-05 DIAGNOSIS — R293 Abnormal posture: Secondary | ICD-10-CM | POA: Diagnosis not present

## 2023-05-05 DIAGNOSIS — R2689 Other abnormalities of gait and mobility: Secondary | ICD-10-CM

## 2023-05-05 DIAGNOSIS — M6281 Muscle weakness (generalized): Secondary | ICD-10-CM

## 2023-05-05 NOTE — Therapy (Signed)
OUTPATIENT PHYSICAL THERAPY NEURO TREATMENT   Patient Name: Joshua Browning MRN: [id number] DOB:08-24-1945, 77 y.o., male Today's Date: 05/05/2023   PCP: Daisy Floro, MD REFERRING PROVIDER: Tat, Octaviano Batty, DO  END OF SESSION:  PT End of Session - 05/05/23 1149     Visit Number 8    Number of Visits 13    Date for PT Re-Evaluation 06/15/23    Authorization Type [employer] Medicare    PT Start Time 1149    PT Stop Time 1230    PT Time Calculation (min) 41 min    Equipment Utilized During Treatment Gait belt    Activity Tolerance Patient tolerated treatment well    Behavior During Therapy WFL for tasks assessed/performed             Past Medical History:  Diagnosis Date   BPH (benign prostatic hyperplasia)    CVA (cerebral vascular accident) (HCC)    Foley catheter in place    removed after turp   GERD (gastroesophageal reflux disease)    Nocturnal leg cramps    occ   Parkinson's disease    Urinary retention    Wears glasses    Past Surgical History:  Procedure Laterality Date   CATARACT EXTRACTION W/ INTRAOCULAR LENS  IMPLANT, BILATERAL Bilateral    INGUINAL HERNIA REPAIR Bilateral RIGHT x 2 1984 umbilical done with last right   left last one done 10 yrs ago   RE-DO RIGHT INGUINAL HERNIA REPAIR AND UMBILICAL HERNIA REPAIR  2001   REMOVAL LEFT EYE INTRAOCULAR LENS AND REPLACEMENT   08-29-2013   TRANSURETHRAL RESECTION OF PROSTATE N/A 09/23/2013   Procedure: TRANSURETHRAL RESECTION OF THE PROSTATE (TURP) WITH GYRUS;  Surgeon: Garnett Farm, MD;  Location: [facility];  Service: Urology;  Laterality: N/A;   VARICOCELECTOMY Left 07/02/2018   Procedure: VARICOCELECTOMY;  Surgeon: Ihor Gully, MD;  Location: [facility];  Service: Urology;  Laterality: Left;   Patient Active Problem List   Diagnosis Date Noted   H/O: stroke 10/27/2022   Cerebrovascular accident (CVA) (HCC) 09/28/2022   Exertional dyspnea 09/28/2022   Parkinson's  disease without dyskinesia, with fluctuating manifestations 09/09/2022   BPH (benign prostatic hypertrophy) with urinary retention 09/23/2013    ONSET DATE: 03/14/2023   REFERRING DIAG: G20.A1 (ICD-10-CM) - Parkinson's disease without dyskinesia or fluctuating manifestations   THERAPY DIAG:  Unsteadiness on feet  Muscle weakness (generalized)  Other abnormalities of gait and mobility  Rationale for Evaluation and Treatment: Rehabilitation  SUBJECTIVE:  SUBJECTIVE STATEMENT: Patient reporting that he wants to work on balance. Denies falls/near falls.    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 No  PRECAUTIONS: Fall   WEIGHT BEARING RESTRICTIONS: No  FALLS: Has patient fallen in last 6 months? No  LIVING ENVIRONMENT: Lives with: lives with their spouse Lives in: House/apartment Stairs: Yes: Internal: 12 steps; on left going up and External: 4 steps; can reach both Has following equipment at home: Grab bars and trekking poles recommended by Dr. Arbutus Leas, has not bought them but wants to work on them   PLOF: Independent  PATIENT GOALS: Wants to improve his gait/balance, learn to use trekking poles   OBJECTIVE:   DIAGNOSTIC FINDINGS: MRI brain 09/18/22:  IMPRESSION: 1. Recent right occipital and posterior temporal cortex infarct. 2. Subacute lacunar infarct in the deep left cerebral white matter. 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 3. Chronic cervical adenopathy also seen on neck CT from 2022, question low-grade lymphoma such as CLL.  MRI lumbar spine with evidence of left S1 radiculopathy. 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     COGNITION: Overall cognitive status: Dr. Alinda Dooms: Repeat testing in 10/2022 demonstrated MCI again, although potentially a bit worse (but he had had a stroke not long prior)    SENSATION: Light touch: WFL  COORDINATION: Heel to shin: WNL  POSTURE: rounded shoulders, forward head, increased thoracic kyphosis, and posterior pelvic tilt  LOWER EXTREMITY ROM:     Decr AROM of knee extension LLE> RLE   LOWER EXTREMITY MMT:    MMT Right Eval Left Eval  Hip flexion 4+ 4  Hip extension    Hip abduction 4 4  Hip adduction 4+ 4+  Hip internal rotation    Hip external rotation    Knee flexion 4+ 4-  Knee extension 4 4  Ankle dorsiflexion 5 4+  Ankle plantarflexion    Ankle inversion    Ankle eversion    (Blank rows = not tested)  All tested in sitting   BED MOBILITY:  Pt reports independence   TRANSFERS: Assistive device utilized: None  Sit to stand: SBA Stand to sit: SBA  Decr forward lean at times   GAIT: Gait pattern: decreased arm swing- Left, decreased stride length, decreased hip/knee flexion- Right, decreased hip/knee flexion- Left, circumduction- Left, Right foot flat, Left foot flat, trunk flexed, narrow BOS, and poor foot clearance- Left Distance walked: Clinic distances  Assistive device utilized: None Level of assistance: SBA Comments: Pt with LLE circumduction and narrow BOS  FUNCTIONAL TESTS:  5 times sit to stand: 11.59 seconds, last rep decr eccentric control back down to chair with no UE support 10 meter walk test: 12.87 seconds = 2.55 ft/sec, limited community ambulator with no AD   TODAY'S TREATMENT:  Vitals:   05/05/23 1153  BP: (!) 154/83  Pulse: 66    Gait:  GAIT: Gait pattern: step through pattern, decreased hip/knee flexion- Right, decreased  hip/knee flexion- Left, decreased ankle dorsiflexion- Right, and decreased ankle dorsiflexion- Left, circumduction with LLE  Distance walked: Clinic distances  Assistive device utilized: None Level of assistance: SBA Comments: In and out of session and clinic distances with no AD; demonstrates initial instability with first steps and with fatigue   GAIT: Gait pattern: step through pattern, decreased hip/knee flexion- Right, decreased hip/knee flexion- Left, decreased ankle dorsiflexion- Right, and decreased ankle dorsiflexion- Left, circumduction with LLE  Distance walked: 1300 feet outdoors on sidewalk + 40 feet through grass Assistive device utilized: Rollator Level of assistance: SBA with CGA progressing to SBA through grass Comments: min cues for upright posture, improves step length, and helps offload L hip, appropriate use across terrain and up and down low grad ramps  NMR:  5 Blaze pods on random setting for improved stepping strategy, reaching large amplitude, quarter turns, and single leg stability.  Performed on 1 minute intervals with 30 rest periods.  Pt requires CGA guarding. Round 1:  4" and 6" step set at angles requiring turn with 3 additional blaze pods on mirror for visual scanning setup.  17 hits. Round 2:   4" and 6" step set at angles requiring turn with 3 additional blaze pods on mirror for visual scanning  setup.  17 hits. Round 3:   4" and 6" step set at angles requiring turn with 3 additional blaze pods on mirror for visual scanning  setup.  15 hits. Notable errors/deficits:  increased challenge with L visual scanning  Restited stepping with large amplitude UE clapping and horizontal abduction with use of orange theraband in // bars and use of sustained amplitude with voice 1 x 20 forward, laterally bidirectional, and backwards  Use of dots for external target to sustain amplitude, greatest challenge with backwards stepping  PATIENT EDUCATION: Education details:  Continue HEP, can try rollator outside at next session if weather permits  Person educated: Patient Education method: Explanation Education comprehension: verbalized understanding  HOME EXERCISE PROGRAM: Standing PWR Moves  Access Code: [email or web address] URL: [email or web address] Date: 04/11/2023 Prepared by: Sherlie Ban  Exercises - Staggered Sit-to-Stand (Mirrored)  - 1 x daily - 5 x weekly - 1-2 sets - 5 reps - Staggered Stance Forward Backward Weight Shift with Counter Support  - 1 x daily - 5 x weekly - 2 sets - 10 reps - Standing Marching  - 1 x daily - 5 x weekly - 2 sets - 10 reps - Forward Step Up  - 1 x daily - 5 x weekly - 2 sets - 10 reps - Seated Hamstring Stretch  - 1 x daily - 5 x weekly - 1 sets - 3 reps - 30 sec hold - Alternating Step Backward with Support  - 1 x daily - 5 x weekly - 1-2 sets - 10 reps - Side Stepping with Resistance at Thighs and Counter Support  - 1 x daily - 5 x weekly - 3 sets  GOALS: Goals reviewed with patient? Yes  SHORT TERM GOALS: Target date: 04/14/2023   Pt will be independent with initial HEP for improved balance, posture, functional mobility.    Baseline: Goal status: MET   LONG TERM GOALS: Target date: 05/12/2023  Pt will be independent with final PD specific HEP for improved balance, posture, functional mobility.  Baseline:  Goal status: INITIAL  2.  Pt will improve posterior step and release test to 2 steps or less with independent balance recovery, to demonstrate improved balance recovery in posterior direction.  Baseline: multiple steps, needs to grab onto // bars for balance  Goal status: INITIAL  3.  Pt will improve miniBEST to at least a 21/28 in order to demo decr fall risk. Baseline: 17/28 Goal status: INITIAL  4.  Pt will improve gait speed with no AD to at least 2.8 ft/sec in order to demo improved community mobility.  Baseline: 12.87 seconds = 2.55 ft/sec, limited community ambulator with no AD Goal  status: INITIAL  5.  Pt will be able to ambulate at least 500' outdoors with supervision and bilateral trekking poles for exercising in the community.  Baseline: 250' with SBA with bilateral trekking poles Goal status: IN PROGRESS    ASSESSMENT:  CLINICAL IMPRESSION: Skilled session emphasized trial of rollator outdoors. Patient appropriate across terrain and demonstrated appropriate ability to dual task and could carry conversation with therapist throughout use. Recommend at this time though patient states unsure if he is ready for one at this time due to concern of looking more impaired that he is. May require further reinforcement in future sessions. Ended session with large amplitude reaching, stepping reaching, and SLS. Continues to demonstrate greatest instability with posterior LOB but improves with practice and external cues. Will continue per POC.   OBJECTIVE IMPAIRMENTS: Abnormal gait, decreased activity tolerance, decreased balance, decreased cognition, decreased coordination, difficulty walking, decreased ROM, decreased strength, impaired flexibility, impaired sensation, and postural dysfunction.   ACTIVITY LIMITATIONS: lifting, bending, stairs, transfers, and locomotion level  PARTICIPATION LIMITATIONS: community activity  PERSONAL FACTORS: Age, Behavior pattern, Past/current experiences, Time since onset of injury/illness/exacerbation, and 3+ comorbidities: PD, MCI, CLL, basal cell carcinoma, Ankle/feet paresthesias, CVA with field cut (09/2022)   are also affecting patient's functional outcome.   REHAB POTENTIAL: Good  CLINICAL DECISION MAKING: Evolving/moderate complexity  EVALUATION COMPLEXITY: Moderate  PLAN:  PT FREQUENCY: 2x/week  PT DURATION: 8 weeks  PLANNED INTERVENTIONS: Therapeutic exercises, Therapeutic activity, Neuromuscular re-education, Balance training, Gait training, Patient/Family education, Self Care, Joint mobilization, Stair training, Vestibular  training, DME instructions, Manual therapy, and Re-evaluation  PLAN FOR NEXT SESSION: L hip strengthening. Balance with vision removed, TURNING. STEPPING STRATEGIES - esp L lateral and posteriorly, work on single leg stance tasks   Carmelia Bake, PT, DPT 05/05/2023, 3:32 PM

## 2023-05-09 ENCOUNTER — Ambulatory Visit: Payer: Medicare Other | Admitting: Physical Therapy

## 2023-05-09 ENCOUNTER — Encounter: Payer: Self-pay | Admitting: Physical Therapy

## 2023-05-09 DIAGNOSIS — R2689 Other abnormalities of gait and mobility: Secondary | ICD-10-CM | POA: Diagnosis not present

## 2023-05-09 DIAGNOSIS — R293 Abnormal posture: Secondary | ICD-10-CM

## 2023-05-09 DIAGNOSIS — M6281 Muscle weakness (generalized): Secondary | ICD-10-CM

## 2023-05-09 DIAGNOSIS — R2681 Unsteadiness on feet: Secondary | ICD-10-CM | POA: Diagnosis not present

## 2023-05-09 NOTE — Therapy (Signed)
OUTPATIENT PHYSICAL THERAPY NEURO TREATMENT   Patient Name: BLYTHE CLOUGH MRN: [id number] DOB:06/11/1946, 77 y.o., male Today's Date: 05/09/2023   PCP: Daisy Floro, MD REFERRING PROVIDER: Tat, Octaviano Batty, DO  END OF SESSION:  PT End of Session - 05/09/23 1149     Visit Number 9    Number of Visits 13    Date for PT Re-Evaluation 06/15/23    Authorization Type [employer] Medicare    PT Start Time 1147    PT Stop Time 1229    PT Time Calculation (min) 42 min    Equipment Utilized During Treatment Gait belt    Activity Tolerance Patient tolerated treatment well    Behavior During Therapy WFL for tasks assessed/performed             Past Medical History:  Diagnosis Date   BPH (benign prostatic hyperplasia)    CVA (cerebral vascular accident) (HCC)    Foley catheter in place    removed after turp   GERD (gastroesophageal reflux disease)    Nocturnal leg cramps    occ   Parkinson's disease    Urinary retention    Wears glasses    Past Surgical History:  Procedure Laterality Date   CATARACT EXTRACTION W/ INTRAOCULAR LENS  IMPLANT, BILATERAL Bilateral    INGUINAL HERNIA REPAIR Bilateral RIGHT x 2 1984 umbilical done with last right   left last one done 10 yrs ago   RE-DO RIGHT INGUINAL HERNIA REPAIR AND UMBILICAL HERNIA REPAIR  2001   REMOVAL LEFT EYE INTRAOCULAR LENS AND REPLACEMENT   08-29-2013   TRANSURETHRAL RESECTION OF PROSTATE N/A 09/23/2013   Procedure: TRANSURETHRAL RESECTION OF THE PROSTATE (TURP) WITH GYRUS;  Surgeon: Garnett Farm, MD;  Location: [facility] ;  Service: Urology;  Laterality: N/A;   VARICOCELECTOMY Left 07/02/2018   Procedure: VARICOCELECTOMY;  Surgeon: Ihor Gully, MD;  Location: [facility];  Service: Urology;  Laterality: Left;   Patient Active Problem List   Diagnosis Date Noted   H/O: stroke 10/27/2022   Cerebrovascular accident (CVA) (HCC) 09/28/2022   Exertional dyspnea 09/28/2022   Parkinson's  disease without dyskinesia, with fluctuating manifestations 09/09/2022   BPH (benign prostatic hypertrophy) with urinary retention 09/23/2013    ONSET DATE: 03/14/2023   REFERRING DIAG: G20.A1 (ICD-10-CM) - Parkinson's disease without dyskinesia or fluctuating manifestations   THERAPY DIAG:  Unsteadiness on feet  Muscle weakness (generalized)  Other abnormalities of gait and mobility  Abnormal posture  Rationale for Evaluation and Treatment: Rehabilitation  SUBJECTIVE:  SUBJECTIVE STATEMENT: No changes since he was last here. Did some mowing on Saturday.   Pt accompanied by: self and    PERTINENT HISTORY: PMH: PD, MCI, CLL, basal cell carcinoma, Ankle/feet paresthesias, CVA with field cut (09/2022)   PAIN:  Are you having pain? No  PRECAUTIONS: Fall   WEIGHT BEARING RESTRICTIONS: No  FALLS: Has patient fallen in last 6 months? No  LIVING ENVIRONMENT: Lives with: lives with their spouse Lives in: House/apartment Stairs: Yes: Internal: 12 steps; on left going up and External: 4 steps; can reach both Has following equipment at home: Grab bars and trekking poles recommended by Dr. Arbutus Leas, has not bought them but wants to work on them   PLOF: Independent  PATIENT GOALS: Wants to improve his gait/balance, learn to use trekking poles   OBJECTIVE:   DIAGNOSTIC FINDINGS: MRI brain 09/18/22:  IMPRESSION: 1. Recent right occipital and posterior temporal cortex infarct. 2. Subacute lacunar infarct in the deep left cerebral white matter. 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 3. Chronic cervical adenopathy also seen on neck CT from 2022, question low-grade lymphoma such as CLL.  MRI lumbar spine with evidence of left S1 radiculopathy. EMG demonstrated chronic multilevel radiculopathies  affecting L3-L5 nerve root segments bilaterally.     COGNITION: Overall cognitive status: Dr. Alinda Dooms: Repeat testing in 10/2022 demonstrated MCI again, although potentially a bit worse (but he had had a stroke not long prior)    SENSATION: Light touch: WFL  COORDINATION: Heel to shin: WNL  POSTURE: rounded shoulders, forward head, increased thoracic kyphosis, and posterior pelvic tilt  LOWER EXTREMITY ROM:     Decr AROM of knee extension LLE> RLE   LOWER EXTREMITY MMT:    MMT Right Eval Left Eval  Hip flexion 4+ 4  Hip extension    Hip abduction 4 4  Hip adduction 4+ 4+  Hip internal rotation    Hip external rotation    Knee flexion 4+ 4-  Knee extension 4 4  Ankle dorsiflexion 5 4+  Ankle plantarflexion    Ankle inversion    Ankle eversion    (Blank rows = not tested)  All tested in sitting   BED MOBILITY:  Pt reports independence   TRANSFERS: Assistive device utilized: None  Sit to stand: SBA Stand to sit: SBA  Decr forward lean at times   GAIT: Gait pattern: decreased arm swing- Left, decreased stride length, decreased hip/knee flexion- Right, decreased hip/knee flexion- Left, circumduction- Left, Right foot flat, Left foot flat, trunk flexed, narrow BOS, and poor foot clearance- Left Distance walked: Clinic distances  Assistive device utilized: None Level of assistance: SBA Comments: Pt with LLE circumduction and narrow BOS  FUNCTIONAL TESTS:  5 times sit to stand: 11.59 seconds, last rep decr eccentric control back down to chair with no UE support 10 meter walk test: 12.87 seconds = 2.55 ft/sec, limited community ambulator with no AD   TODAY'S TREATMENT:  There were no vitals filed for this visit.   Gait:  GAIT: Gait pattern: step through pattern, decreased hip/knee flexion- Right, decreased hip/knee flexion- Left, decreased  ankle dorsiflexion- Right, and decreased ankle dorsiflexion- Left, circumduction with LLE  Distance walked: Clinic distances  Assistive device utilized: None Level of assistance: SBA Comments: In and out of session and clinic distances with no AD; demonstrates initial instability with first steps and with fatigue   GAIT: Gait pattern: step through pattern, decreased hip/knee flexion- Right, decreased hip/knee flexion- Left, decreased ankle dorsiflexion- Right, and decreased ankle dorsiflexion- Left, circumduction with LLE  Distance walked: 1300 feet outdoors on sidewalk + 40 feet through grass Assistive device utilized: Rollator Level of assistance: SBA with CGA progressing to SBA through grass Comments: min cues for upright posture, improves step length, and helps offload L hip, appropriate use across terrain and up and down low grad ramps  NMR:  10 reps sit <> stands without UE support on air ex, initial cues for incr forward lean and tall posture in standing Performed an additional 10 reps sit <> stands from a 14" box to simulate getting up from lower surfaces such as the couch. Cues for incr forward lean and using rocking/momentum and counting to 3 to come to stand. Pt reporting this felt easier with rocking, discussed using this technique when getting up from couch at home.   Pt performs PWR! Moves in Standing position on air ex:   PWR! Up for improved posture x10 reps, performed an additional 10 reps with pt performing heel raise for anterior weight shift, pt unable to keep heels lifted for a few seconds   PWR! Rock for improved weight shifting x20 reps, cues to look up at hands   PWR! Twist for improved trunk rotation x10 reps with cues to reset in the middle   PWR! Step for improved step initiation x20 reps   Pt intermittently unsteady on air ex, sometimes needing intermittent UE support    With 5 blaze pods with 2 on 6" step and 3 on 2nd step for improved visual scanning,  weight shifting, SLS, and hip flexion. Pt standing on air ex for compliant surface, performing without UE support. CGA as needed for balance Performed 2 bouts of 1 minute: 28 hits, 28 hits (cued to alternate between R and L when tapping) Performed 3 bouts of 1 minute with added cognitive challenge: first 2 reps: 13, 9 hits (naming foods in alphabetical order), 16 hits (naming animals)  Retro stepping 3 x 30' with ball toss for coordination with 2nd PT, with primary PT providing CGA for balance, initial cues for incr step length backwards, pt did well incr step length and was able to maintain his balance   Throughout session, with gait between tasks, pt needing cues for tall posture, arm swing, and incr stride length with heel strike   PATIENT EDUCATION: Education details: Continue HEP, pt needing to cancel appt next week as pt will be out of town, discussed re-scheduling this appt for either another day next week or the following week. Pt will need to chest with his wife and the calendar to get scheduled, will plan to call back or get scheduled when pt is here on Friday.  Person educated: Patient Education method: Explanation Education comprehension: verbalized understanding  HOME EXERCISE PROGRAM: Standing PWR Moves  Access Code: [email or web address] URL: [email or web address] Date: 04/11/2023 Prepared by: Sherlie Ban  Exercises - Staggered Sit-to-Stand (Mirrored)  - 1 x daily - 5 x weekly -  1-2 sets - 5 reps - Staggered Stance Forward Backward Weight Shift with Counter Support  - 1 x daily - 5 x weekly - 2 sets - 10 reps - Standing Marching  - 1 x daily - 5 x weekly - 2 sets - 10 reps - Forward Step Up  - 1 x daily - 5 x weekly - 2 sets - 10 reps - Seated Hamstring Stretch  - 1 x daily - 5 x weekly - 1 sets - 3 reps - 30 sec hold - Alternating Step Backward with Support  - 1 x daily - 5 x weekly - 1-2 sets - 10 reps - Side Stepping with Resistance at Thighs and Counter Support  -  1 x daily - 5 x weekly - 3 sets  GOALS: Goals reviewed with patient? Yes  SHORT TERM GOALS: Target date: 04/14/2023   Pt will be independent with initial HEP for improved balance, posture, functional mobility.    Baseline: Goal status: MET   LONG TERM GOALS: Target date: 05/12/2023  Pt will be independent with final PD specific HEP for improved balance, posture, functional mobility.  Baseline:  Goal status: INITIAL  2.  Pt will improve posterior step and release test to 2 steps or less with independent balance recovery, to demonstrate improved balance recovery in posterior direction.  Baseline: multiple steps, needs to grab onto // bars for balance  Goal status: INITIAL  3.  Pt will improve miniBEST to at least a 21/28 in order to demo decr fall risk. Baseline: 17/28 Goal status: INITIAL  4.  Pt will improve gait speed with no AD to at least 2.8 ft/sec in order to demo improved community mobility.  Baseline: 12.87 seconds = 2.55 ft/sec, limited community ambulator with no AD Goal status: INITIAL  5.  Pt will be able to ambulate at least 500' outdoors with supervision and bilateral trekking poles for exercising in the community.  Baseline: 250' with SBA with bilateral trekking poles Goal status: IN PROGRESS    ASSESSMENT:  CLINICAL IMPRESSION: Today's skilled session focused on balance on unlevel surfaces, SLS tasks, cognitive dual tasking, and retro stepping. Pt challenged by cognitive dual tasking with SLS exercises with blaze pods. Pt takes incr time to perform balance tasks and PT needing to provide cues to pt to think of an object. Pt did well with retro gait tasks with ball toss and coordination. Pt able to maintain incr step length posterior with no LOB. Will continue per POC.    OBJECTIVE IMPAIRMENTS: Abnormal gait, decreased activity tolerance, decreased balance, decreased cognition, decreased coordination, difficulty walking, decreased ROM, decreased strength,  impaired flexibility, impaired sensation, and postural dysfunction.   ACTIVITY LIMITATIONS: lifting, bending, stairs, transfers, and locomotion level  PARTICIPATION LIMITATIONS: community activity  PERSONAL FACTORS: Age, Behavior pattern, Past/current experiences, Time since onset of injury/illness/exacerbation, and 3+ comorbidities: PD, MCI, CLL, basal cell carcinoma, Ankle/feet paresthesias, CVA with field cut (09/2022)   are also affecting patient's functional outcome.   REHAB POTENTIAL: Good  CLINICAL DECISION MAKING: Evolving/moderate complexity  EVALUATION COMPLEXITY: Moderate  PLAN:  PT FREQUENCY: 2x/week  PT DURATION: 8 weeks  PLANNED INTERVENTIONS: Therapeutic exercises, Therapeutic activity, Neuromuscular re-education, Balance training, Gait training, Patient/Family education, Self Care, Joint mobilization, Stair training, Vestibular training, DME instructions, Manual therapy, and Re-evaluation  PLAN FOR NEXT SESSION: check goals and update goal date, will need 10th visit PN    L hip strengthening. Balance with vision removed, TURNING. STEPPING STRATEGIES - esp L lateral and  posteriorly, work on single leg stance tasks   Drake Leach, PT, DPT 05/09/2023, 1:18 PM

## 2023-05-10 ENCOUNTER — Other Ambulatory Visit: Payer: Self-pay | Admitting: Neurology

## 2023-05-10 DIAGNOSIS — G20A1 Parkinson's disease without dyskinesia, without mention of fluctuations: Secondary | ICD-10-CM

## 2023-05-10 DIAGNOSIS — R251 Tremor, unspecified: Secondary | ICD-10-CM

## 2023-05-10 DIAGNOSIS — G2581 Restless legs syndrome: Secondary | ICD-10-CM

## 2023-05-12 ENCOUNTER — Encounter: Payer: Self-pay | Admitting: Physical Therapy

## 2023-05-12 ENCOUNTER — Ambulatory Visit: Payer: Medicare Other | Admitting: Physical Therapy

## 2023-05-12 VITALS — BP 130/69 | HR 72

## 2023-05-12 DIAGNOSIS — R293 Abnormal posture: Secondary | ICD-10-CM | POA: Diagnosis not present

## 2023-05-12 DIAGNOSIS — R2681 Unsteadiness on feet: Secondary | ICD-10-CM

## 2023-05-12 DIAGNOSIS — R2689 Other abnormalities of gait and mobility: Secondary | ICD-10-CM | POA: Diagnosis not present

## 2023-05-12 DIAGNOSIS — M6281 Muscle weakness (generalized): Secondary | ICD-10-CM | POA: Diagnosis not present

## 2023-05-12 NOTE — Therapy (Signed)
OUTPATIENT PHYSICAL THERAPY NEURO TREATMENT / 10th VISIT PROGRESS NOTE   Patient Name: Joshua Browning MRN: [id number] DOB:[birth year], 77 y.o., male Today's Date: 05/12/2023   PCP: Daisy Floro, MD REFERRING PROVIDER: Tat, Octaviano Batty, DO  END OF SESSION:  PT End of Session - 05/12/23 1148     Visit Number 10    Number of Visits 13    Date for PT Re-Evaluation 06/15/23    Authorization Type [employer]    PT Start Time 1145    PT Stop Time 1232    PT Time Calculation (min) 47 min    Equipment Utilized During Treatment Gait belt    Activity Tolerance Patient tolerated treatment well    Behavior During Therapy WFL for tasks assessed/performed             Past Medical History:  Diagnosis Date   BPH (benign prostatic hyperplasia)    CVA (cerebral vascular accident) (HCC)    Foley catheter in place    removed after turp   GERD (gastroesophageal reflux disease)    Nocturnal leg cramps    occ   Parkinson's disease    Urinary retention    Wears glasses    Past Surgical History:  Procedure Laterality Date   CATARACT EXTRACTION W/ INTRAOCULAR LENS  IMPLANT, BILATERAL Bilateral    INGUINAL HERNIA REPAIR Bilateral RIGHT x 2 1984 umbilical done with last right   left last one done 10 yrs ago   RE-DO RIGHT INGUINAL HERNIA REPAIR AND UMBILICAL HERNIA REPAIR  2001   REMOVAL LEFT EYE INTRAOCULAR LENS AND REPLACEMENT   08-29-2013   TRANSURETHRAL RESECTION OF PROSTATE N/A 09/23/2013   Procedure: TRANSURETHRAL RESECTION OF THE PROSTATE (TURP) WITH GYRUS;  Surgeon: Garnett Farm, MD;  Location: [facility];  Service: Urology;  Laterality: N/A;   VARICOCELECTOMY Left 07/02/2018   Procedure: VARICOCELECTOMY;  Surgeon: Ihor Gully, MD;  Location: [facility];  Service: Urology;  Laterality: Left;   Patient Active Problem List   Diagnosis Date Noted   H/O: stroke 10/27/2022   Cerebrovascular accident (CVA) (HCC) 09/28/2022   Exertional dyspnea  09/28/2022   Parkinson's disease without dyskinesia, with fluctuating manifestations 09/09/2022   BPH (benign prostatic hypertrophy) with urinary retention 09/23/2013    ONSET DATE: 03/14/2023   REFERRING DIAG: G20.A1 (ICD-10-CM) - Parkinson's disease without dyskinesia or fluctuating manifestations   THERAPY DIAG:  Unsteadiness on feet  Muscle weakness (generalized)  Other abnormalities of gait and mobility  Rationale for Evaluation and Treatment: Rehabilitation  SUBJECTIVE:  SUBJECTIVE STATEMENT: Patient denies falls/near falls since last here. Denies changes to medications.   Pt accompanied by: self and    PERTINENT HISTORY: PMH: PD, MCI, CLL, basal cell carcinoma, Ankle/feet paresthesias, CVA with field cut (09/2022)   PAIN:  Are you having pain? No  PRECAUTIONS: Fall   WEIGHT BEARING RESTRICTIONS: No  FALLS: Has patient fallen in last 6 months? No  LIVING ENVIRONMENT: Lives with: lives with their spouse Lives in: House/apartment Stairs: Yes: Internal: 12 steps; on left going up and External: 4 steps; can reach both Has following equipment at home: Grab bars and trekking poles recommended by Dr. Arbutus Leas, has not bought them but wants to work on them   PLOF: Independent  PATIENT GOALS: Wants to improve his gait/balance, learn to use trekking poles   OBJECTIVE:   DIAGNOSTIC FINDINGS: MRI brain 09/18/22:  IMPRESSION: 1. Recent right occipital and posterior temporal cortex infarct. 2. Subacute lacunar infarct in the deep left cerebral white matter. 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 3. Chronic cervical adenopathy also seen on neck CT from 2022, question low-grade lymphoma such as CLL.  MRI lumbar spine with evidence of left S1 radiculopathy. 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     COGNITION: Overall cognitive status: Dr. Alinda Dooms: Repeat testing in 10/2022 demonstrated MCI again, although potentially a bit worse (but he had had a stroke not long prior)    SENSATION: Light touch: WFL  COORDINATION: Heel to shin: WNL  POSTURE: rounded shoulders, forward head, increased thoracic kyphosis, and posterior pelvic tilt  LOWER EXTREMITY ROM:     Decr AROM of knee extension LLE> RLE   LOWER EXTREMITY MMT:    MMT Right Eval Left Eval  Hip flexion 4+ 4  Hip extension    Hip abduction 4 4  Hip adduction 4+ 4+  Hip internal rotation    Hip external rotation    Knee flexion 4+ 4-  Knee extension 4 4  Ankle dorsiflexion 5 4+  Ankle plantarflexion    Ankle inversion    Ankle eversion    (Blank rows = not tested)  All tested in sitting   BED MOBILITY:  Pt reports independence   TRANSFERS: Assistive device utilized: None  Sit to stand: SBA Stand to sit: SBA  Decr forward lean at times   GAIT: Gait pattern: decreased arm swing- Left, decreased stride length, decreased hip/knee flexion- Right, decreased hip/knee flexion- Left, circumduction- Left, Right foot flat, Left foot flat, trunk flexed, narrow BOS, and poor foot clearance- Left Distance walked: Clinic distances  Assistive device utilized: None Level of assistance: SBA Comments: Pt with LLE circumduction and narrow BOS  FUNCTIONAL TESTS:  5 times sit to stand: 11.59 seconds, last rep decr eccentric control back down to chair with no UE support 10 meter walk test: 12.87 seconds = 2.55 ft/sec, limited community ambulator with no AD   [facility] PT Assessment - 05/12/23 0001       Standardized Balance Assessment   Standardized Balance Assessment 10 meter walk test    10 Meter Walk 3.05   ft/sec (SBA without AD)     Mini-BESTest   Sit To Stand Normal: Comes to stand without use of hands and stabilizes independently.    Rise to  Toes Normal: Stable for 3 s with maximum height.   max height for patient's available ROM   Stand on one leg (left) Moderate: < 20 s   10 seconds   Stand on  one leg (right) Moderate: < 20 s   7 seconds   Stand on one leg - lowest score 1    Compensatory Stepping Correction - Forward Normal: Recovers independently with a single, large step (second realignement is allowed).    Compensatory Stepping Correction - Backward Normal: Recovers independently with a single, large step   1 large step   Compensatory Stepping Correction - Left Lateral Normal: Recovers independently with 1 step (crossover or lateral OK)    Compensatory Stepping Correction - Right Lateral Normal: Recovers independently with 1 step (crossover or lateral OK)    Stepping Corredtion Lateral - lowest score 2    Stance - Feet together, eyes open, firm surface  Normal: 30s    Stance - Feet together, eyes closed, foam surface  Normal: 30s    Incline - Eyes Closed Normal: Stands independently 30s and aligns with gravity    Change in Gait Speed Normal: Significantly changes walkling speed without imbalance    Walk with head turns - Horizontal Moderate: performs head turns with reduction in gait speed.   reduces pace   Walk with pivot turns Moderate:Turns with feet close SLOW (>4 steps) with good balance.   mild reduction in speed   Step over obstacles Moderate: Steps over box but touches box OR displays cautious behavior by slowing gait.   reduces pace but otherwise clears well   Timed UP & GO with Dual Task Moderate: Dual Task affects either counting OR walking (>10%) when compared to the TUG without Dual Task.   see below   Mini-BEST total score 23      Timed Up and Go Test   Normal TUG (seconds) 11.2   seconds without AD (SBA - full turns)   Cognitive TUG (seconds) 15.01   seconds without AD (counting backwards by 3 by 50)   TUG Comments Score > 10% difference            TODAY'S TREATMENT:                                                                                                                               Vitals:   05/12/23 1201  BP: 130/69  Pulse: 72     [facility] PT Assessment - 05/12/23 0001       Standardized Balance Assessment   Standardized Balance Assessment 10 meter walk test    10 Meter Walk 3.05   ft/sec (SBA without AD)     Mini-BESTest   Sit To Stand Normal: Comes to stand without use of hands and stabilizes independently.    Rise to Toes Normal: Stable for 3 s with maximum height.   max height for patient's available ROM   Stand on one leg (left) Moderate: < 20 s   10 seconds   Stand on one leg (right) Moderate: < 20 s   7 seconds   Stand on one leg - lowest score 1  Compensatory Stepping Correction - Forward Normal: Recovers independently with a single, large step (second realignement is allowed).    Compensatory Stepping Correction - Backward Normal: Recovers independently with a single, large step   1 large step   Compensatory Stepping Correction - Left Lateral Normal: Recovers independently with 1 step (crossover or lateral OK)    Compensatory Stepping Correction - Right Lateral Normal: Recovers independently with 1 step (crossover or lateral OK)    Stepping Corredtion Lateral - lowest score 2    Stance - Feet together, eyes open, firm surface  Normal: 30s    Stance - Feet together, eyes closed, foam surface  Normal: 30s    Incline - Eyes Closed Normal: Stands independently 30s and aligns with gravity    Change in Gait Speed Normal: Significantly changes walkling speed without imbalance    Walk with head turns - Horizontal Moderate: performs head turns with reduction in gait speed.   reduces pace   Walk with pivot turns Moderate:Turns with feet close SLOW (>4 steps) with good balance.   mild reduction in speed   Step over obstacles Moderate: Steps over box but touches box OR displays cautious behavior by slowing gait.   reduces pace but otherwise clears well   Timed UP & GO with Dual  Task Moderate: Dual Task affects either counting OR walking (>10%) when compared to the TUG without Dual Task.   see below   Mini-BEST total score 23      Timed Up and Go Test   Normal TUG (seconds) 11.2   seconds without AD (SBA - full turns)   Cognitive TUG (seconds) 15.01   seconds without AD (counting backwards by 3 by 50)   TUG Comments Score > 10% difference            NMR: Cross body reaching diagonals initially with same side arm to target with weight shift and twist 1 x 10, progressed to cross body reaching with full body twist to external targets on counters 1 x 10 Posterior large amplitude stepping off ramp to floor with target of getting full foot of ramp x 10 with no LOB and maintained amplitude of steps   PATIENT EDUCATION: Education details: Continue HEP + progress towards goals Person educated: Patient Education method: Explanation Education comprehension: verbalized understanding  HOME EXERCISE PROGRAM: Standing PWR Moves  Access Code: [email or web address] URL: [email or web address] Date: 04/11/2023 Prepared by: Sherlie Ban  Exercises - Staggered Sit-to-Stand (Mirrored)  - 1 x daily - 5 x weekly - 1-2 sets - 5 reps - Staggered Stance Forward Backward Weight Shift with Counter Support  - 1 x daily - 5 x weekly - 2 sets - 10 reps - Standing Marching  - 1 x daily - 5 x weekly - 2 sets - 10 reps - Forward Step Up  - 1 x daily - 5 x weekly - 2 sets - 10 reps - Seated Hamstring Stretch  - 1 x daily - 5 x weekly - 1 sets - 3 reps - 30 sec hold - Alternating Step Backward with Support  - 1 x daily - 5 x weekly - 1-2 sets - 10 reps - Side Stepping with Resistance at Thighs and Counter Support  - 1 x daily - 5 x weekly - 3 sets  GOALS: Goals reviewed with patient? Yes  SHORT TERM GOALS: Target date: 04/14/2023   Pt will be independent with initial HEP for improved balance, posture, functional mobility.    Baseline: Goal status: MET  LONG TERM GOALS:  Target date: 05/29/2023 (date updated to complete missed visits)  Pt will be independent with final PD specific HEP for improved balance, posture, functional mobility.  Baseline: Reports exercises are going well Goal status: Progressing  2.  Pt will improve posterior step and release test to 2 steps or less with independent balance recovery, to demonstrate improved balance recovery in posterior direction.  Baseline: multiple steps, needs to grab onto // bars for balance, able to perform only completing with 1 large step Goal status: MET  3.  Pt will improve miniBEST to at least a 21/28 in order to demo decr fall risk. Baseline: 17/28; improved to 23/28 Goal status: MET  4.  Pt will improve gait speed with no AD to at least 2.8 ft/sec in order to demo improved community mobility.  Baseline: 12.87 seconds = 2.55 ft/sec, limited 2 community ambulator with no AD; 3.05 ft/sec without AD  Goal status: MET  5.  Pt will be able to ambulate at least 500' outdoors with supervision and bilateral trekking poles for exercising in the community.  Baseline: 250' with SBA with bilateral trekking poles Goal status: IN PROGRESS    ASSESSMENT:  CLINICAL IMPRESSION: Today's skilled session focused on 10th visit progress note, assessing patient's LTGs. Patient progressing excellently. Plan to finish remaining visits to work on final two goals that are still in progress. Consider other AD beside trekking pole as needed for goal 5. Patient still within falls risk in miniBEST but notably improved particularly on static balance and stepping reactions. Patient completed all stepping reaction tasks with only 1 step for each, receiving full marks. Will continue per POC.    OBJECTIVE IMPAIRMENTS: Abnormal gait, decreased activity tolerance, decreased balance, decreased cognition, decreased coordination, difficulty walking, decreased ROM, decreased strength, impaired flexibility, impaired sensation, and postural  dysfunction.   ACTIVITY LIMITATIONS: lifting, bending, stairs, transfers, and locomotion level  PARTICIPATION LIMITATIONS: community activity  PERSONAL FACTORS: Age, Behavior pattern, Past/current experiences, Time since onset of injury/illness/exacerbation, and 3+ comorbidities: PD, MCI, CLL, basal cell carcinoma, Ankle/feet paresthesias, CVA with field cut (09/2022)   are also affecting patient's functional outcome.   REHAB POTENTIAL: Good  CLINICAL DECISION MAKING: Evolving/moderate complexity  EVALUATION COMPLEXITY: Moderate  PLAN:  PT FREQUENCY: 2x/week  PT DURATION: 8 weeks  PLANNED INTERVENTIONS: Therapeutic exercises, Therapeutic activity, Neuromuscular re-education, Balance training, Gait training, Patient/Family education, Self Care, Joint mobilization, Stair training, Vestibular training, DME instructions, Manual therapy, and Re-evaluation  PLAN FOR NEXT SESSION: check goals and update goal date, will need 10th visit PN    L hip strengthening. Balance with vision removed, TURNING. STEPPING STRATEGIES - esp L lateral and posteriorly, work on single leg stance tasks, resisted side stepping with pertubations/resistance as tolerated, weight shift on uneven surface and balance static/dynamic with head turns  *Chloe patient had missed a few visits and requested to have them added on; still within cert date   Carmelia Bake, PT, DPT 05/12/2023, 12:42 PM

## 2023-05-16 ENCOUNTER — Ambulatory Visit: Payer: Medicare Other | Admitting: Physical Therapy

## 2023-05-19 ENCOUNTER — Encounter: Payer: Self-pay | Admitting: Physical Therapy

## 2023-05-19 ENCOUNTER — Ambulatory Visit: Payer: Medicare Other | Attending: Family Medicine | Admitting: Physical Therapy

## 2023-05-19 DIAGNOSIS — R41841 Cognitive communication deficit: Secondary | ICD-10-CM | POA: Diagnosis present

## 2023-05-19 DIAGNOSIS — M6281 Muscle weakness (generalized): Secondary | ICD-10-CM | POA: Diagnosis not present

## 2023-05-19 DIAGNOSIS — R471 Dysarthria and anarthria: Secondary | ICD-10-CM | POA: Insufficient documentation

## 2023-05-19 DIAGNOSIS — R2689 Other abnormalities of gait and mobility: Secondary | ICD-10-CM | POA: Diagnosis not present

## 2023-05-19 DIAGNOSIS — R293 Abnormal posture: Secondary | ICD-10-CM | POA: Diagnosis not present

## 2023-05-19 DIAGNOSIS — R2681 Unsteadiness on feet: Secondary | ICD-10-CM | POA: Diagnosis not present

## 2023-05-19 NOTE — Therapy (Signed)
OUTPATIENT PHYSICAL THERAPY NEURO TREATMENT   Patient Name: Joshua Browning MRN: [id number] DOB:10-21-1945, 77 y.o., male Today's Date: 05/19/2023   PCP: Daisy Floro, MD REFERRING PROVIDER: Tat, Octaviano Batty, DO  END OF SESSION:  PT End of Session - 05/19/23 1019     Visit Number 11    Number of Visits 13    Date for PT Re-Evaluation 06/15/23    Authorization Type [employer] Medicare    PT Start Time 1017    PT Stop Time 1058    PT Time Calculation (min) 41 min    Equipment Utilized During Treatment Gait belt    Activity Tolerance Patient tolerated treatment well    Behavior During Therapy WFL for tasks assessed/performed             Past Medical History:  Diagnosis Date   BPH (benign prostatic hyperplasia)    CVA (cerebral vascular accident) (HCC)    Foley catheter in place    removed after turp   GERD (gastroesophageal reflux disease)    Nocturnal leg cramps    occ   Parkinson's disease (HCC)    Urinary retention    Wears glasses    Past Surgical History:  Procedure Laterality Date   CATARACT EXTRACTION W/ INTRAOCULAR LENS  IMPLANT, BILATERAL Bilateral    INGUINAL HERNIA REPAIR Bilateral RIGHT x 2 1984 umbilical done with last right   left last one done 10 yrs ago   RE-DO RIGHT INGUINAL HERNIA REPAIR AND UMBILICAL HERNIA REPAIR  2001   REMOVAL LEFT EYE INTRAOCULAR LENS AND REPLACEMENT   08-29-2013   TRANSURETHRAL RESECTION OF PROSTATE N/A 09/23/2013   Procedure: TRANSURETHRAL RESECTION OF THE PROSTATE (TURP) WITH GYRUS;  Surgeon: Garnett Farm, MD;  Location: [facility];  Service: Urology;  Laterality: N/A;   VARICOCELECTOMY Left 07/02/2018   Procedure: VARICOCELECTOMY;  Surgeon: Ihor Gully, MD;  Location: [facility];  Service: Urology;  Laterality: Left;   Patient Active Problem List   Diagnosis Date Noted   H/O: stroke 10/27/2022   Cerebrovascular accident (CVA) (HCC) 09/28/2022   Exertional dyspnea 09/28/2022    Parkinson's disease without dyskinesia, with fluctuating manifestations (HCC) 09/09/2022   Benign prostatic hyperplasia with urinary retention 09/23/2013    ONSET DATE: 03/14/2023   REFERRING DIAG: G20.A1 (ICD-10-CM) - Parkinson's disease without dyskinesia or fluctuating manifestations   THERAPY DIAG:  Unsteadiness on feet  Muscle weakness (generalized)  Other abnormalities of gait and mobility  Abnormal posture  Rationale for Evaluation and Treatment: Rehabilitation  SUBJECTIVE:  SUBJECTIVE STATEMENT: Feeling more hoarse today. Moving around the house is getting easier. Wants to work more on his balance   Pt accompanied by: self and    PERTINENT HISTORY: PMH: PD, MCI, CLL, basal cell carcinoma, Ankle/feet paresthesias, CVA with field cut (09/2022)   PAIN:  Are you having pain? No  PRECAUTIONS: Fall   WEIGHT BEARING RESTRICTIONS: No  FALLS: Has patient fallen in last 6 months? No  LIVING ENVIRONMENT: Lives with: lives with their spouse Lives in: House/apartment Stairs: Yes: Internal: 12 steps; on left going up and External: 4 steps; can reach both Has following equipment at home: Grab bars and trekking poles recommended by Dr. Arbutus Leas, has not bought them but wants to work on them   PLOF: Independent  PATIENT GOALS: Wants to improve his gait/balance, learn to use trekking poles   OBJECTIVE:   DIAGNOSTIC FINDINGS: MRI brain 09/18/22:  IMPRESSION: 1. Recent right occipital and posterior temporal cortex infarct. 2. Subacute lacunar infarct in the deep left cerebral white matter. 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 3. Chronic cervical adenopathy also seen on neck CT from 2022, question low-grade lymphoma such as CLL.  MRI lumbar spine with evidence of left S1  radiculopathy. 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     COGNITION: Overall cognitive status: Dr. Alinda Dooms: Repeat testing in 10/2022 demonstrated MCI again, although potentially a bit worse (but he had had a stroke not long prior)    SENSATION: Light touch: WFL  COORDINATION: Heel to shin: WNL  POSTURE: rounded shoulders, forward head, increased thoracic kyphosis, and posterior pelvic tilt  LOWER EXTREMITY ROM:     Decr AROM of knee extension LLE> RLE   LOWER EXTREMITY MMT:    MMT Right Eval Left Eval  Hip flexion 4+ 4  Hip extension    Hip abduction 4 4  Hip adduction 4+ 4+  Hip internal rotation    Hip external rotation    Knee flexion 4+ 4-  Knee extension 4 4  Ankle dorsiflexion 5 4+  Ankle plantarflexion    Ankle inversion    Ankle eversion    (Blank rows = not tested)  All tested in sitting   BED MOBILITY:  Pt reports independence   TRANSFERS: Assistive device utilized: None  Sit to stand: SBA Stand to sit: SBA  Decr forward lean at times   GAIT: Gait pattern: decreased arm swing- Left, decreased stride length, decreased hip/knee flexion- Right, decreased hip/knee flexion- Left, circumduction- Left, Right foot flat, Left foot flat, trunk flexed, narrow BOS, and poor foot clearance- Left Distance walked: Clinic distances  Assistive device utilized: None Level of assistance: SBA Comments: Pt with LLE circumduction and narrow BOS  FUNCTIONAL TESTS:  5 times sit to stand: 11.59 seconds, last rep decr eccentric control back down to chair with no UE support 10 meter walk test: 12.87 seconds = 2.55 ft/sec, limited community ambulator with no AD    TODAY'S TREATMENT:       Therapeutic Exercise: SciFit with BUE/BLE at gear 4.5 for 8 minutes for reciprocal movement patterns, ROM, neural priming, incr amplitude of stepping. Educated on RPE and purpose of moderate intensity exercise with PD and  aerobic exercise. Pt reporting RPE 6-7/10  NMR:  GAIT: Gait pattern: decreased arm swing- Left, decreased stride length, decreased hip/knee flexion- Right, decreased hip/knee flexion- Left, circumduction- Left, Right foot flat, Left foot flat, trunk flexed, narrow BOS, and poor foot clearance- Left Distance walked: 50' x1, plus additional distances during clinic Assistive device utilized: None Level of assistance: SBA Comments: Pt with LLE circumduction and narrow BOS, but has improved  With gait, cued for posture and thinking about PWRing Up, stride length/heel strike, and reciprocal arm swing. Cued to stop and reset and PWR Up if pt notices posture gets more forwards. Needs intermittent cues for this during session.   Resisted gait with blue t-band resistance around pelvis (one PT providing resistance and 2nd PT providing CGA/min A as needed), 115' of gait with steady resistance with reminder cues for posture and stride length,, 230' with varying resistance with pt able to use appropriate stepping strategy to maintain balance  On blue foam beam: Alternating forward step and weight shift and stepping back on x10 reps each side, then adding in head turns when stepping to R/L x10 reps each side, needing intermittent cues for incr foot clearance with LLE  Alternating lateral step over smaller orange obstacle x10 reps each side for improved foot clearance/SLS, then progressed to PWR step when reaching and cued to look at hands x10 reps each side   PATIENT EDUCATION: Education details: Continue HEP, working on gait with more upright posture, stride length, and heel strike.  Person educated: Patient Education method: Explanation, Demonstration, and Verbal cues Education comprehension: verbalized understanding and returned demonstration  HOME EXERCISE PROGRAM: Standing PWR  Moves  Access Code: [email or web address] URL: [email or web address] Date: 04/11/2023 Prepared by: Sherlie Ban  Exercises - Staggered Sit-to-Stand (Mirrored)  - 1 x daily - 5 x weekly - 1-2 sets - 5 reps - Staggered Stance Forward Backward Weight Shift with Counter Support  - 1 x daily - 5 x weekly - 2 sets - 10 reps - Standing Marching  - 1 x daily - 5 x weekly - 2 sets - 10 reps - Forward Step Up  - 1 x daily - 5 x weekly - 2 sets - 10 reps - Seated Hamstring Stretch  - 1 x daily - 5 x weekly - 1 sets - 3 reps - 30 sec hold - Alternating Step Backward with Support  - 1 x daily - 5 x weekly - 1-2 sets - 10 reps - Side Stepping with Resistance at Thighs and Counter Support  - 1 x daily - 5 x weekly - 3 sets  GOALS: Goals reviewed with patient? Yes  SHORT TERM GOALS: Target date: 04/14/2023   Pt will be independent with initial HEP for improved balance, posture, functional mobility.    Baseline: Goal status: MET   LONG TERM GOALS: Target date: 05/29/2023 (date updated to complete missed visits)  Pt will be independent with final PD specific HEP for improved balance, posture, functional mobility.  Baseline: Reports exercises are going well Goal status: Progressing  2.  Pt will improve posterior step and release test to 2 steps or less with independent balance recovery, to demonstrate improved balance recovery in posterior direction.  Baseline: multiple steps, needs to grab onto // bars for balance, able to perform only completing with 1 large step Goal status: MET  3.  Pt will improve miniBEST to at least a 21/28 in order to demo decr fall risk. Baseline: 17/28; improved to 23/28 Goal status: MET  4.  Pt will improve gait speed  with no AD to at least 2.8 ft/sec in order to demo improved community mobility.  Baseline: 12.87 seconds = 2.55 ft/sec, limited 2 community ambulator with no AD; 3.05 ft/sec without AD  Goal status: MET  5.  Pt will be able to ambulate at least  500' outdoors with supervision and bilateral trekking poles for exercising in the community.  Baseline: 250' with SBA with bilateral trekking poles Goal status: IN PROGRESS    ASSESSMENT:  CLINICAL IMPRESSION: Today's skilled session focused on working on gait with posture/stride length, SciFit for larger amplitude movements, and balance strategies on compliant surfaces with foot clearance/stepping. Pt needing reminder cues to 'PWR Up' during gait for posture and to focus on incr stride length and heel strike. With resisted gait, pt able to appropriately maintain balance with stepping strategy. Pt did well with balance tasks on compliant surfaces with foot clearance with LLE. Will continue to progress towards LTGs.    OBJECTIVE IMPAIRMENTS: Abnormal gait, decreased activity tolerance, decreased balance, decreased cognition, decreased coordination, difficulty walking, decreased ROM, decreased strength, impaired flexibility, impaired sensation, and postural dysfunction.   ACTIVITY LIMITATIONS: lifting, bending, stairs, transfers, and locomotion level  PARTICIPATION LIMITATIONS: community activity  PERSONAL FACTORS: Age, Behavior pattern, Past/current experiences, Time since onset of injury/illness/exacerbation, and 3+ comorbidities: PD, MCI, CLL, basal cell carcinoma, Ankle/feet paresthesias, CVA with field cut (09/2022)   are also affecting patient's functional outcome.   REHAB POTENTIAL: Good  CLINICAL DECISION MAKING: Evolving/moderate complexity  EVALUATION COMPLEXITY: Moderate  PLAN:  PT FREQUENCY: 2x/week  PT DURATION: 8 weeks  PLANNED INTERVENTIONS: Therapeutic exercises, Therapeutic activity, Neuromuscular re-education, Balance training, Gait training, Patient/Family education, Self Care, Joint mobilization, Stair training, Vestibular training, DME instructions, Manual therapy, and Re-evaluation  PLAN FOR NEXT SESSION: check goals and update goal date, will need 10th visit PN     L hip strengthening. Balance with vision removed, TURNING. STEPPING STRATEGIES - esp L lateral and posteriorly, work on single leg stance tasks, resisted side stepping with pertubations/resistance as tolerated, weight shift on uneven surface and balance static/dynamic with head turns   Drake Leach, PT, DPT 05/19/2023, 12:15 PM

## 2023-05-23 ENCOUNTER — Encounter: Payer: Self-pay | Admitting: Physical Therapy

## 2023-05-23 ENCOUNTER — Ambulatory Visit: Payer: Medicare Other | Admitting: Physical Therapy

## 2023-05-23 DIAGNOSIS — R293 Abnormal posture: Secondary | ICD-10-CM | POA: Diagnosis not present

## 2023-05-23 DIAGNOSIS — R2689 Other abnormalities of gait and mobility: Secondary | ICD-10-CM

## 2023-05-23 DIAGNOSIS — R2681 Unsteadiness on feet: Secondary | ICD-10-CM | POA: Diagnosis not present

## 2023-05-23 DIAGNOSIS — R471 Dysarthria and anarthria: Secondary | ICD-10-CM | POA: Diagnosis not present

## 2023-05-23 DIAGNOSIS — M6281 Muscle weakness (generalized): Secondary | ICD-10-CM

## 2023-05-23 NOTE — Therapy (Signed)
OUTPATIENT PHYSICAL THERAPY NEURO TREATMENT   Patient Name: Joshua Browning MRN: [id number] DOB:Mar 02, 1946, 77 y.o., male Today's Date: 05/23/2023   PCP: Daisy Floro, MD REFERRING PROVIDER: Tat, Octaviano Batty, DO  END OF SESSION:  PT End of Session - 05/23/23 1318     Visit Number 12    Number of Visits 13    Date for PT Re-Evaluation 06/15/23    Authorization Type [employer] Medicare    PT Start Time 1317    PT Stop Time 1400    PT Time Calculation (min) 43 min    Equipment Utilized During Treatment Gait belt    Activity Tolerance Patient tolerated treatment well    Behavior During Therapy WFL for tasks assessed/performed             Past Medical History:  Diagnosis Date   BPH (benign prostatic hyperplasia)    CVA (cerebral vascular accident) (HCC)    Foley catheter in place    removed after turp   GERD (gastroesophageal reflux disease)    Nocturnal leg cramps    occ   Parkinson's disease (HCC)    Urinary retention    Wears glasses    Past Surgical History:  Procedure Laterality Date   CATARACT EXTRACTION W/ INTRAOCULAR LENS  IMPLANT, BILATERAL Bilateral    INGUINAL HERNIA REPAIR Bilateral RIGHT x 2 1984 umbilical done with last right   left last one done 10 yrs ago   RE-DO RIGHT INGUINAL HERNIA REPAIR AND UMBILICAL HERNIA REPAIR  2001   REMOVAL LEFT EYE INTRAOCULAR LENS AND REPLACEMENT   08-29-2013   TRANSURETHRAL RESECTION OF PROSTATE N/A 09/23/2013   Procedure: TRANSURETHRAL RESECTION OF THE PROSTATE (TURP) WITH GYRUS;  Surgeon: Garnett Farm, MD;  Location: [facility];  Service: Urology;  Laterality: N/A;   VARICOCELECTOMY Left 07/02/2018   Procedure: VARICOCELECTOMY;  Surgeon: Ihor Gully, MD;  Location: [facility];  Service: Urology;  Laterality: Left;   Patient Active Problem List   Diagnosis Date Noted   H/O: stroke 10/27/2022   Cerebrovascular accident (CVA) (HCC) 09/28/2022   Exertional dyspnea 09/28/2022    Parkinson's disease without dyskinesia, with fluctuating manifestations (HCC) 09/09/2022   Benign prostatic hyperplasia with urinary retention 09/23/2013    ONSET DATE: 03/14/2023   REFERRING DIAG: G20.A1 (ICD-10-CM) - Parkinson's disease without dyskinesia or fluctuating manifestations   THERAPY DIAG:  Unsteadiness on feet  Muscle weakness (generalized)  Other abnormalities of gait and mobility  Abnormal posture  Rationale for Evaluation and Treatment: Rehabilitation  SUBJECTIVE:  SUBJECTIVE STATEMENT: Reports continued hoarseness with his voice. No falls. Wants to work on his balance and walking.   Pt accompanied by: self and    PERTINENT HISTORY: PMH: PD, MCI, CLL, basal cell carcinoma, Ankle/feet paresthesias, CVA with field cut (09/2022)   PAIN:  Are you having pain? No  PRECAUTIONS: Fall   WEIGHT BEARING RESTRICTIONS: No  FALLS: Has patient fallen in last 6 months? No  LIVING ENVIRONMENT: Lives with: lives with their spouse Lives in: House/apartment Stairs: Yes: Internal: 12 steps; on left going up and External: 4 steps; can reach both Has following equipment at home: Grab bars and trekking poles recommended by Dr. Arbutus Leas, has not bought them but wants to work on them   PLOF: Independent  PATIENT GOALS: Wants to improve his gait/balance, learn to use trekking poles   OBJECTIVE:   DIAGNOSTIC FINDINGS: MRI brain 09/18/22:  IMPRESSION: 1. Recent right occipital and posterior temporal cortex infarct. 2. Subacute lacunar infarct in the deep left cerebral white matter. 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 3. Chronic cervical adenopathy also seen on neck CT from 2022, question low-grade lymphoma such as CLL.  MRI lumbar spine with evidence of left S1 radiculopathy. EMG  demonstrated chronic multilevel radiculopathies affecting L3-L5 nerve root segments bilaterally.     COGNITION: Overall cognitive status: Dr. Alinda Dooms: Repeat testing in 10/2022 demonstrated MCI again, although potentially a bit worse (but he had had a stroke not long prior)    SENSATION: Light touch: WFL  COORDINATION: Heel to shin: WNL  POSTURE: rounded shoulders, forward head, increased thoracic kyphosis, and posterior pelvic tilt  LOWER EXTREMITY ROM:     Decr AROM of knee extension LLE> RLE   LOWER EXTREMITY MMT:    MMT Right Eval Left Eval  Hip flexion 4+ 4  Hip extension    Hip abduction 4 4  Hip adduction 4+ 4+  Hip internal rotation    Hip external rotation    Knee flexion 4+ 4-  Knee extension 4 4  Ankle dorsiflexion 5 4+  Ankle plantarflexion    Ankle inversion    Ankle eversion    (Blank rows = not tested)  All tested in sitting   BED MOBILITY:  Pt reports independence   TRANSFERS: Assistive device utilized: None  Sit to stand: SBA Stand to sit: SBA  Decr forward lean at times   GAIT: Gait pattern: decreased arm swing- Left, decreased stride length, decreased hip/knee flexion- Right, decreased hip/knee flexion- Left, circumduction- Left, Right foot flat, Left foot flat, trunk flexed, narrow BOS, and poor foot clearance- Left Distance walked: Clinic distances  Assistive device utilized: None Level of assistance: SBA Comments: Pt with LLE circumduction and narrow BOS  FUNCTIONAL TESTS:  5 times sit to stand: 11.59 seconds, last rep decr eccentric control back down to chair with no UE support 10 meter walk test: 12.87 seconds = 2.55 ft/sec, limited community ambulator with no AD    TODAY'S TREATMENT:       Therapeutic Activity: Had discussion regarding speech therapy as pt reports worsening hoarseness in his voice. Pt reports that he will just wait until his screen for speech that is scheduled in January. Educated with PD, not waiting for things to  get worse and being proactive and working on deficits before they get worse. Prior speech screen also recommended pt to be seen due to dysarthria, but pt declined at that time. Discussed it would be best for pt to get referral now to go ahead  and work with speech therapy. Pt in agreement with this plan.                                                                                                                     NMR: Marching down and back at countertop x4 reps with green t-band around thighs for improved hip flexion, needing UE support for balance, pt needs cues for knee extension in stance as pt performing it in knee flexion, additional cues for looking straight ahead  Multi-directional stepping sheet with upright posture with looking straight ahead, pt needing max verbal/demo cues for technique and instructions on how to perform  Following directions with stepping only, cued to also say directions out loud when stepping, x1 rep Then adding in coordinated arm movements with stepping, x4 reps (arms out in PWR Up with forward/lateral steps and arms forward when stepping posteriorly), needing max cues for coordinated UE movements and tall posture, pt improved with incr reps  With 4 blaze pods in a square, working on turning, weight shifting, and visual scanning. Performed on random hit setting. Worked with turning with picking up feet big and leading with outside foot, pt needs reminder cues for this intermittently during activity, intermittent CGA for balance  Performed 3 bouts of 1 minute: 8 hits, 9 hits, 11 hits, 10 hits  Gait 230' x 1 with purple self ball toss for manual dual task with focus on tall posture, stride length, and heel strike, no LOB noted   GAIT: Gait pattern: decreased arm swing- Left, decreased stride length, decreased hip/knee flexion- Right, decreased hip/knee flexion- Left, circumduction- Left, Right foot flat, Left foot flat, trunk flexed, narrow BOS, and poor foot clearance-  Left Distance walked: Clinic distances  Assistive device utilized: None Level of assistance: SBA Comments: With gait, cued for posture and thinking about PWRing Up, stride length/heel strike, and reciprocal arm swing. Cued to stop and reset and PWR Up if pt notices posture gets more forwards. Needs intermittent cues for this during session.     PATIENT EDUCATION: Education details: Continue HEP, adding green t-band resistance for marching at [employer], speech therapy referral  Person educated: Patient Education method: Explanation, Demonstration, and Verbal cues Education comprehension: verbalized understanding and returned demonstration  HOME EXERCISE PROGRAM: Standing PWR Moves  Access Code: [email or web address] URL: [email or web address] Date: 04/11/2023 Prepared by: Sherlie Ban  Exercises - Staggered Sit-to-Stand (Mirrored)  - 1 x daily - 5 x weekly - 1-2 sets - 5 reps - Staggered Stance Forward Backward Weight Shift with Counter Support  - 1 x daily - 5 x weekly - 2 sets - 10 reps - Standing Marching  - 1 x daily - 5 x weekly - 2 sets - 10 reps - with use of green tband around thighs  - Forward Step Up  - 1 x daily - 5 x weekly - 2 sets - 10 reps - Seated Hamstring Stretch  - 1 x daily - 5 x weekly - 1 sets - 3 reps -  30 sec hold - Alternating Step Backward with Support  - 1 x daily - 5 x weekly - 1-2 sets - 10 reps - Side Stepping with Resistance at Thighs and Counter Support  - 1 x daily - 5 x weekly - 3 sets  GOALS: Goals reviewed with patient? Yes  SHORT TERM GOALS: Target date: 04/14/2023   Pt will be independent with initial HEP for improved balance, posture, functional mobility.    Baseline: Goal status: MET   LONG TERM GOALS: Target date: 05/29/2023 (date updated to complete missed visits)  Pt will be independent with final PD specific HEP for improved balance, posture, functional mobility.  Baseline: Reports exercises are going well Goal status:  Progressing  2.  Pt will improve posterior step and release test to 2 steps or less with independent balance recovery, to demonstrate improved balance recovery in posterior direction.  Baseline: multiple steps, needs to grab onto // bars for balance, able to perform only completing with 1 large step Goal status: MET  3.  Pt will improve miniBEST to at least a 21/28 in order to demo decr fall risk. Baseline: 17/28; improved to 23/28 Goal status: MET  4.  Pt will improve gait speed with no AD to at least 2.8 ft/sec in order to demo improved community mobility.  Baseline: 12.87 seconds = 2.55 ft/sec, limited 2 community ambulator with no AD; 3.05 ft/sec without AD  Goal status: MET  5.  Pt will be able to ambulate at least 500' outdoors with supervision and bilateral trekking poles for exercising in the community.  Baseline: 250' with SBA with bilateral trekking poles Goal status: IN PROGRESS    ASSESSMENT:  CLINICAL IMPRESSION:  Today's skilled session focused on multi-directional stepping, gait with dual tasking, SLS, and turning with blaze pods. With turns in a small area, pt needing cues to take a big step leading with outside foot. With multi-directional stepping with coordination with UE movement, pt needing max cues for sequencing. Did improve with incr reps. Also discussed speech therapy due to pt reporting incr hoarseness with pt in agreement with speech therapy referral at this time. Will continue per POC.    OBJECTIVE IMPAIRMENTS: Abnormal gait, decreased activity tolerance, decreased balance, decreased cognition, decreased coordination, difficulty walking, decreased ROM, decreased strength, impaired flexibility, impaired sensation, and postural dysfunction.   ACTIVITY LIMITATIONS: lifting, bending, stairs, transfers, and locomotion level  PARTICIPATION LIMITATIONS: community activity  PERSONAL FACTORS: Age, Behavior pattern, Past/current experiences, Time since onset of  injury/illness/exacerbation, and 3+ comorbidities: PD, MCI, CLL, basal cell carcinoma, Ankle/feet paresthesias, CVA with field cut (09/2022)   are also affecting patient's functional outcome.   REHAB POTENTIAL: Good  CLINICAL DECISION MAKING: Evolving/moderate complexity  EVALUATION COMPLEXITY: Moderate  PLAN:  PT FREQUENCY: 2x/week  PT DURATION: 8 weeks  PLANNED INTERVENTIONS: Therapeutic exercises, Therapeutic activity, Neuromuscular re-education, Balance training, Gait training, Patient/Family education, Self Care, Joint mobilization, Stair training, Vestibular training, DME instructions, Manual therapy, and Re-evaluation  PLAN FOR NEXT SESSION: look at goals, finalize HEP and plan for D/C  Drake Leach, PT, DPT 05/23/2023, 3:43 PM

## 2023-05-25 ENCOUNTER — Telehealth: Payer: Self-pay | Admitting: Physical Therapy

## 2023-05-25 DIAGNOSIS — R471 Dysarthria and anarthria: Secondary | ICD-10-CM

## 2023-05-25 NOTE — Telephone Encounter (Signed)
Dr. Arbutus Leas,  Darrick Grinder have been seen for PT at [facility] for PD. The patient would benefit from a ST evaluation for worsening hoarseness.   If you agree, please place an order in [facility]que in [facility] or fax the order to [phone]. Please send to [address] location.   Thank you, Sherlie Ban, PT, DPT 05/25/23 9:40 AM    [facility] [address] [place], [place]  [address] Phone:  [phone] Fax:  [phone]

## 2023-05-29 ENCOUNTER — Ambulatory Visit: Payer: Medicare Other | Admitting: Physical Therapy

## 2023-05-29 ENCOUNTER — Other Ambulatory Visit: Payer: Self-pay | Admitting: Neurology

## 2023-05-29 ENCOUNTER — Encounter: Payer: Self-pay | Admitting: Physical Therapy

## 2023-05-29 DIAGNOSIS — R2681 Unsteadiness on feet: Secondary | ICD-10-CM | POA: Diagnosis not present

## 2023-05-29 DIAGNOSIS — R2689 Other abnormalities of gait and mobility: Secondary | ICD-10-CM

## 2023-05-29 DIAGNOSIS — G2581 Restless legs syndrome: Secondary | ICD-10-CM

## 2023-05-29 DIAGNOSIS — R293 Abnormal posture: Secondary | ICD-10-CM | POA: Diagnosis not present

## 2023-05-29 DIAGNOSIS — G20A2 Parkinson's disease without dyskinesia, with fluctuations: Secondary | ICD-10-CM

## 2023-05-29 DIAGNOSIS — G20A1 Parkinson's disease without dyskinesia, without mention of fluctuations: Secondary | ICD-10-CM

## 2023-05-29 DIAGNOSIS — R471 Dysarthria and anarthria: Secondary | ICD-10-CM | POA: Diagnosis not present

## 2023-05-29 DIAGNOSIS — M6281 Muscle weakness (generalized): Secondary | ICD-10-CM

## 2023-05-29 DIAGNOSIS — R251 Tremor, unspecified: Secondary | ICD-10-CM

## 2023-05-29 NOTE — Therapy (Signed)
OUTPATIENT PHYSICAL THERAPY NEURO TREATMENT/DISCHARGE SUMMARY   Patient Name: Joshua Browning MRN: [id number] DOB:1946/03/17, 77 y.o., male Today's Date: 05/29/2023   PCP: Joshua Floro, MD REFERRING PROVIDER: Tat, Joshua Batty, DO  END OF SESSION:  PT End of Session - 05/29/23 1319     Visit Number 13    Number of Visits 13    Date for PT Re-Evaluation 06/15/23    Authorization Type [employer] Medicare    PT Start Time 1317    PT Stop Time 1359    PT Time Calculation (min) 42 min    Equipment Utilized During Treatment --    Activity Tolerance Patient tolerated treatment well    Behavior During Therapy WFL for tasks assessed/performed             Past Medical History:  Diagnosis Date   BPH (benign prostatic hyperplasia)    CVA (cerebral vascular accident) (HCC)    Foley catheter in place    removed after turp   GERD (gastroesophageal reflux disease)    Nocturnal leg cramps    occ   Parkinson's disease (HCC)    Urinary retention    Wears glasses    Past Surgical History:  Procedure Laterality Date   CATARACT EXTRACTION W/ INTRAOCULAR LENS  IMPLANT, BILATERAL Bilateral    INGUINAL HERNIA REPAIR Bilateral RIGHT x 2 1984 umbilical done with last right   left last one done 10 yrs ago   RE-DO RIGHT INGUINAL HERNIA REPAIR AND UMBILICAL HERNIA REPAIR  2001   REMOVAL LEFT EYE INTRAOCULAR LENS AND REPLACEMENT   08-29-2013   TRANSURETHRAL RESECTION OF PROSTATE N/A 09/23/2013   Procedure: TRANSURETHRAL RESECTION OF THE PROSTATE (TURP) WITH GYRUS;  Surgeon: Garnett Farm, MD;  Location: [facility];  Service: Urology;  Laterality: N/A;   VARICOCELECTOMY Left 07/02/2018   Procedure: VARICOCELECTOMY;  Surgeon: Ihor Gully, MD;  Location: [facility];  Service: Urology;  Laterality: Left;   Patient Active Problem List   Diagnosis Date Noted   H/O: stroke 10/27/2022   Cerebrovascular accident (CVA) (HCC) 09/28/2022   Exertional dyspnea 09/28/2022    Parkinson's disease without dyskinesia, with fluctuating manifestations (HCC) 09/09/2022   Benign prostatic hyperplasia with urinary retention 09/23/2013    ONSET DATE: 03/14/2023   REFERRING DIAG: G20.A1 (ICD-10-CM) - Parkinson's disease without dyskinesia or fluctuating manifestations   THERAPY DIAG:  Unsteadiness on feet  Muscle weakness (generalized)  Other abnormalities of gait and mobility  Abnormal posture  Rationale for Evaluation and Treatment: Rehabilitation  SUBJECTIVE:  SUBJECTIVE STATEMENT: Joshua Browning to the PD symposium last Friday and reports that it went well. Got scheduled for ST eval at [place], reports it is closer to his house. No falls. Feels like his walking is doing better.   Pt accompanied by: self and    PERTINENT HISTORY: PMH: PD, MCI, CLL, basal cell carcinoma, Ankle/feet paresthesias, CVA with field cut (09/2022)   PAIN:  Are you having pain? No  PRECAUTIONS: Fall   WEIGHT BEARING RESTRICTIONS: No  FALLS: Has patient fallen in last 6 months? No  LIVING ENVIRONMENT: Lives with: lives with their spouse Lives in: House/apartment Stairs: Yes: Internal: 12 steps; on left going up and External: 4 steps; can reach both Has following equipment at home: Grab bars and trekking poles recommended by Dr. Arbutus Leas, has not bought them but wants to work on them   PLOF: Independent  PATIENT GOALS: Wants to improve his gait/balance, learn to use trekking poles   OBJECTIVE:   DIAGNOSTIC FINDINGS: MRI brain 09/18/22:  IMPRESSION: 1. Recent right occipital and posterior temporal cortex infarct. 2. Subacute lacunar infarct in the deep left cerebral white matter. 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 3. Chronic cervical adenopathy also seen on neck CT from 2022, question  low-grade lymphoma such as CLL.  MRI lumbar spine with evidence of left S1 radiculopathy. 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     COGNITION: Overall cognitive status: Dr. Alinda Dooms: Repeat testing in 10/2022 demonstrated MCI again, although potentially a bit worse (but he had had a stroke not long prior)    SENSATION: Light touch: WFL  COORDINATION: Heel to shin: WNL  POSTURE: rounded shoulders, forward head, increased thoracic kyphosis, and posterior pelvic tilt  LOWER EXTREMITY ROM:     Decr AROM of knee extension LLE> RLE   LOWER EXTREMITY MMT:    MMT Right Eval Left Eval  Hip flexion 4+ 4  Hip extension    Hip abduction 4 4  Hip adduction 4+ 4+  Hip internal rotation    Hip external rotation    Knee flexion 4+ 4-  Knee extension 4 4  Ankle dorsiflexion 5 4+  Ankle plantarflexion    Ankle inversion    Ankle eversion    (Blank rows = not tested)  All tested in sitting   BED MOBILITY:  Pt reports independence   TRANSFERS: Assistive device utilized: None  Sit to stand: SBA Stand to sit: SBA  Decr forward lean at times   GAIT: Gait pattern: decreased arm swing- Left, decreased stride length, decreased hip/knee flexion- Right, decreased hip/knee flexion- Left, circumduction- Left, Right foot flat, Left foot flat, trunk flexed, narrow BOS, and poor foot clearance- Left Distance walked: Clinic distances  Assistive device utilized: None Level of assistance: SBA Comments: Pt with LLE circumduction and narrow BOS  FUNCTIONAL TESTS:  5 times sit to stand: 11.59 seconds, last rep decr eccentric control back down to chair with no UE support 10 meter walk test: 12.87 seconds = 2.55 ft/sec, limited community ambulator with no AD    TODAY'S TREATMENT:       Therapeutic Activity:  Access Code: [email or web address] URL:  [email or web address] Date: 04/11/2023 Prepared by: Sherlie Ban  Reviewed final HEP below, see MedBridge for further details   Exercises - Staggered Sit-to-Stand (Mirrored)  - 1 x daily - 5 x weekly - 1-2 sets - 5 reps - Staggered Stance Forward Backward Weight Shift with Counter Support  - 1 x daily - 5 x weekly - 2 sets - 10 reps - Standing Marching  - 1 x daily - 5 x weekly - 2 sets - 10 reps - with use of green tband around thighs  - Forward Step Up  - 1 x daily - 5 x weekly - 2 sets - 10 reps - Seated Hamstring Stretch  - 1 x daily - 5 x weekly - 1 sets - 3 reps - 30 sec hold - Alternating Step Backward with Support  - 1 x daily - 5 x weekly - 1-2 sets - 10 reps - Side Stepping with Resistance at Thighs and Counter Support  - 1 x daily - 5 x weekly - 3 sets   Reviewed turning techniques Gave updated PD resources community resources  Discussed pt currently scheduled for [facility] and will still stay scheduled for screens in January at this time   GAIT: Gait pattern: decreased arm swing- Left, decreased stride length, decreased hip/knee flexion- Right, decreased hip/knee flexion- Left, circumduction- Left, Right foot flat, Left foot flat, trunk flexed, narrow BOS, and poor foot clearance- Left Distance walked: Clinic distances  Assistive device utilized: None Level of assistance: SBA Comments: With gait, cued for posture and thinking about PWRing Up, stride length/heel strike, and reciprocal arm swing. Cued to stop and reset and PWR Up if pt notices posture gets more forwards.   PATIENT EDUCATION: Education details: Review of HEP and continuing to perform, D/C from therapy at this time (pt had already had screens scheduled in January, so will keep those at this time) Person educated: Patient Education method: Explanation, Demonstration, and Verbal cues Education comprehension: verbalized understanding and returned demonstration  HOME EXERCISE  PROGRAM: Standing PWR Moves  Access Code: [email or web address] URL: [email or web address] Date: 04/11/2023 Prepared by: Sherlie Ban  Exercises - Staggered Sit-to-Stand (Mirrored)  - 1 x daily - 5 x weekly - 1-2 sets - 5 reps - Staggered Stance Forward Backward Weight Shift with Counter Support  - 1 x daily - 5 x weekly - 2 sets - 10 reps - Standing Marching  - 1 x daily - 5 x weekly - 2 sets - 10 reps - with use of green tband around thighs  - Forward Step Up  - 1 x daily - 5 x weekly - 2 sets - 10 reps - Seated Hamstring Stretch  - 1 x daily - 5 x weekly - 1 sets - 3 reps - 30 sec hold - Alternating Step Backward with Support  - 1 x daily - 5 x weekly - 1-2 sets - 10 reps - Side Stepping with Resistance at Thighs and Counter Support  - 1 x daily - 5 x weekly - 3 sets    PHYSICAL THERAPY DISCHARGE SUMMARY  Visits from Start of Care: 13  Current functional level related to goals / functional outcomes: See LTGs/Clinical Assessment Statement   Remaining deficits: Impaired timing/coordination of gait, impaired posture, impaired balance, decr LLE strength, bradykinesia   Education / Equipment: HEP, updated PD community resources   Patient agrees to discharge. Patient goals were met. Patient is being discharged due to meeting the stated rehab goals.  GOALS: Goals reviewed with patient? Yes  SHORT TERM GOALS: Target date: 04/14/2023   Pt will be independent with initial HEP for improved balance, posture, functional mobility.    Baseline: Goal status: MET   LONG TERM GOALS: Target date: 05/29/2023 (date updated to complete missed visits)  Pt will be independent with final PD specific HEP for improved balance, posture, functional mobility.  Baseline: Reports exercises are going well Goal status: MET  2.  Pt will improve posterior step and release test to 2 steps or less with independent balance recovery, to demonstrate improved balance recovery in posterior  direction.  Baseline: multiple steps, needs to grab onto // bars for balance, able to perform only completing with 1 large step Goal status: MET  3.  Pt will improve miniBEST to at least a 21/28 in order to demo decr fall risk. Baseline: 17/28; improved to 23/28 Goal status: MET  4.  Pt will improve gait speed with no AD to at least 2.8 ft/sec in order to demo improved community mobility.  Baseline: 12.87 seconds = 2.55 ft/sec, limited 2 community ambulator with no AD; 3.05 ft/sec without AD  Goal status: MET  5.  Pt will be able to ambulate at least 500' outdoors with supervision and bilateral trekking poles for exercising in the community.  Baseline: 250' with SBA with bilateral trekking poles  Due to cognition and difficulty sequencing, pt unable to safely use bilateral or single trekking pole  Goal status: IN PROGRESS    ASSESSMENT:  CLINICAL IMPRESSION:  Today's skilled session focused on multi-directional stepping, gait with dual tasking, SLS, and turning with blaze pods. With turns in a small area, pt needing cues to take a big step leading with outside foot. With multi-directional stepping with coordination with UE movement, pt needing max cues for sequencing. Did improve with incr reps. Also discussed speech therapy due to pt reporting incr hoarseness with pt in agreement with speech therapy referral at this time. Will continue per POC.    OBJECTIVE IMPAIRMENTS: Abnormal gait, decreased activity tolerance, decreased balance, decreased cognition, decreased coordination, difficulty walking, decreased ROM, decreased strength, impaired flexibility, impaired sensation, and postural dysfunction.   ACTIVITY LIMITATIONS: lifting, bending, stairs, transfers, and locomotion level  PARTICIPATION LIMITATIONS: community activity  PERSONAL FACTORS: Age, Behavior pattern, Past/current experiences, Time since onset of injury/illness/exacerbation, and 3+ comorbidities: PD, MCI, CLL, basal  cell carcinoma, Ankle/feet paresthesias, CVA with field cut (09/2022)   are also affecting patient's functional outcome.   REHAB POTENTIAL: Good  CLINICAL DECISION MAKING: Evolving/moderate complexity  EVALUATION COMPLEXITY: Moderate  PLAN:  PT FREQUENCY: 2x/week  PT DURATION: 8 weeks  PLANNED INTERVENTIONS: Therapeutic exercises, Therapeutic activity, Neuromuscular re-education, Balance training, Gait training, Patient/Family education, Self Care, Joint mobilization, Stair training, Vestibular training, DME instructions, Manual therapy, and Re-evaluation  PLAN FOR NEXT SESSION: D/C from PT   Drake Leach, PT, DPT 05/29/2023, 2:01 PM

## 2023-05-31 ENCOUNTER — Telehealth: Payer: Self-pay | Admitting: Cardiology

## 2023-05-31 ENCOUNTER — Ambulatory Visit: Payer: Medicare Other

## 2023-05-31 ENCOUNTER — Other Ambulatory Visit: Payer: Self-pay

## 2023-05-31 DIAGNOSIS — R471 Dysarthria and anarthria: Secondary | ICD-10-CM | POA: Diagnosis not present

## 2023-05-31 DIAGNOSIS — R41841 Cognitive communication deficit: Secondary | ICD-10-CM

## 2023-05-31 DIAGNOSIS — R2681 Unsteadiness on feet: Secondary | ICD-10-CM | POA: Diagnosis not present

## 2023-05-31 DIAGNOSIS — R2689 Other abnormalities of gait and mobility: Secondary | ICD-10-CM | POA: Diagnosis not present

## 2023-05-31 DIAGNOSIS — M6281 Muscle weakness (generalized): Secondary | ICD-10-CM | POA: Diagnosis not present

## 2023-05-31 DIAGNOSIS — R293 Abnormal posture: Secondary | ICD-10-CM | POA: Diagnosis not present

## 2023-05-31 NOTE — Patient Instructions (Signed)
   Do 5 loud "AHHHHHHHHHHHHHH" twice a day  Read out loud for 60 seconds, 5 times

## 2023-05-31 NOTE — Therapy (Signed)
OUTPATIENT SPEECH LANGUAGE PATHOLOGY PARKINSON'S EVALUATION   Patient Name: Joshua Browning MRN: [id number] DOB:1945-12-04, 77 y.o., male Today's Date: 05/31/2023  PCP: Daisy Floro, MD REFERRING PROVIDER: Kerin Salen, DO  END OF SESSION:  End of Session - 05/31/23 1739     Visit Number 1    Number of Visits 17    Date for SLP Re-Evaluation 08/11/23    SLP Start Time 1450    SLP Stop Time  1530    SLP Time Calculation (min) 40 min    Activity Tolerance Patient tolerated treatment well             Past Medical History:  Diagnosis Date   BPH (benign prostatic hyperplasia)    CVA (cerebral vascular accident) (HCC)    Foley catheter in place    removed after turp   GERD (gastroesophageal reflux disease)    Nocturnal leg cramps    occ   Parkinson's disease (HCC)    Urinary retention    Wears glasses    Past Surgical History:  Procedure Laterality Date   CATARACT EXTRACTION W/ INTRAOCULAR LENS  IMPLANT, BILATERAL Bilateral    INGUINAL HERNIA REPAIR Bilateral RIGHT x 2 1984 umbilical done with last right   left last one done 10 yrs ago   RE-DO RIGHT INGUINAL HERNIA REPAIR AND UMBILICAL HERNIA REPAIR  2001   REMOVAL LEFT EYE INTRAOCULAR LENS AND REPLACEMENT   08-29-2013   TRANSURETHRAL RESECTION OF PROSTATE N/A 09/23/2013   Procedure: TRANSURETHRAL RESECTION OF THE PROSTATE (TURP) WITH GYRUS;  Surgeon: Garnett Farm, MD;  Location: [facility];  Service: Urology;  Laterality: N/A;   VARICOCELECTOMY Left 07/02/2018   Procedure: VARICOCELECTOMY;  Surgeon: Ihor Gully, MD;  [facility];  Service: Urology;  Laterality: Left;   Patient Active Problem List   Diagnosis Date Noted   H/O: stroke 10/27/2022   Cerebrovascular accident (CVA) (HCC) 09/28/2022   Exertional dyspnea 09/28/2022   Parkinson's disease without dyskinesia, with fluctuating manifestations (HCC) 09/09/2022   Benign prostatic hyperplasia with urinary retention  09/23/2013    ONSET DATE: Script dated 05-25-23  REFERRING DIAG:  R47.1 (ICD-10-CM) - Dysarthria    THERAPY DIAG:  Dysarthria and anarthria  Cognitive communication deficit  Rationale for Evaluation and Treatment: Rehabilitation  SUBJECTIVE:   SUBJECTIVE STATEMENT: "I notice that she does ask me to speak up." Pt accompanied by: self  PERTINENT HISTORY:  PD, MCI, CLL, basal cell carcinoma, Ankle/feet paresthesias, CVA with field cut (09/2022)   PAIN:  Are you having pain? No  FALLS: Has patient fallen in last 6 months?  No  LIVING ENVIRONMENT: Lives with: lives with their spouse Lives in: House/apartment  PLOF:  Level of assistance: Independent with ADLs, Independent with IADLs Employment: Retired  PATIENT GOALS: "Speak more clearly where I don't have that hoarseness, and better volume so that people don't ask me to repeat."  OBJECTIVE:  Note: Objective measures were completed at Evaluation unless otherwise noted.  DIAGNOSTIC FINDINGS:  Speech Therapy Parkinson's Disease Screen  May 2024   Decibel Level today: 66 dB  (WNL=70-72 dB) with sound level meter 30cm away from pt's mouth. Pt's conversational volume has decreased since last treatment course. Patient mentions that his communication is going well at home, with no overt communication challenges.  Pt does not report difficulty with swallowing, reporting improved swallow function since esophageal dilation. Ongoing challenges with street names and people's names as primary concern re: cognitive skills.  Pt would  benefit from skilled ST to address dysarthria, however pt declines at this time. Recommend ST screen in another six months   COGNITION: Overall cognitive status: Within functional limits for tasks assessed Areas of impairment: Memory (pt reports visual memory appears decr'd compared to last year at this time). Pt no longer driving (recommendation from Dr. Arbutus Leas)  MOTOR SPEECH: Overall motor speech:  impaired Level of impairment: Word, Phrase, Sentence, and Conversation Respiration: thoracic breathing Phonation: hoarse and low vocal intensity (intermittently hoarse) Resonance: WFL Articulation: Appears intact with silibants slightly distorted as can occur in pts with high frequency hearing loss Intelligibility: Intelligible Interfering components: hearing loss ? (Silibants) Effective technique: increased vocal intensity  ORAL MOTOR EXAMINATION: Overall status: Impaired: Labial: Bilateral (Coordination) Lingual: Bilateral (Coordination) Comments: Strength appeared mostly WNL, definitely WFL   OBJECTIVE VOICE ASSESSMENT: Sustained "ah" maximum phonation time: 9 seconds with cues for length Sustained "ah" loudness average: 83 dB with consistent cues for loudness Oral reading (passage) loudness average: 67 dB Oral reading loudness range: 60-70 dB Conversational loudness average: 65 dB Conversational loudness range: <69-69 dB Voice quality:  rarely minimally hoarse Stimulability trials: Given SLP modeling and occasional min cues, loudness average increased to 70dB (range of 63 to 74) at reading level.  Comments: Pt self corrected during Q and A when his volume decr'd to 66 dB. He increased to 69dB average.  Completed audio recording of patients baseline voice without cueing from SLP: No  Pt does not report difficulty with swallowing which does not warrant further evaluation.  PATIENT REPORTED OUTCOME MEASURES (PROM): Communication Effectiveness Survey: to be provided in first 2 sessions  TODAY'S TREATMENT:                                                                                                                                         DATE:  05/31/23: SLP shaped pt's loud /a/ over 7 minutes, and educated about the rationale for this activity for homework. SLP used digital recording to assist with this shaping, and the explanation for rationale of loud /a/. SLP educated pt about  he will feel like he is talking TOO loudly and this is actually indication he is speaking at WNL volume.   PATIENT EDUCATION: Education details: see "today's treatment" Person educated: Patient Education method: Explanation, Demonstration, Verbal cues, and Handouts Education comprehension: verbalized understanding, returned demonstration, verbal cues required, and needs further education  HOME EXERCISE PROGRAM: 5 loud /a/ BID, to incr to 10 asap   GOALS: Goals reviewed with patient? Yes  SHORT TERM GOALS: Target date: 07/07/23  Pt will demo loud /a/ average mid 80s dB in 3 sessions Baseline: Goal status: INITIAL  2.  Pt will respond with sentences with average 70dB over 2 sessions Baseline:  Goal status: INITIAL  3.  Pt will produce 3 minutes simple conversation with average 70dB with nonverbal cues over three sessions  Baseline:  Goal  status: INITIAL   LONG TERM GOALS: Target date: 08/11/23  Pt will maintain loud /a/ average in mid 80s dB in 3 sessions after 07/07/23 Baseline:  Goal status: INITIAL  2.  Pt will produce 10 minutes simple-mod complex conversation with average 70dB over three sessions  Baseline:  Goal status: INITIAL  3.  Pt will track appointment schedule and/or to-do lists via external cues during 3 sessions Baseline:  Goal status: INITIAL  4.  Pt will improve PROM score compared to initial administration Baseline:  Goal status: INITIAL   ASSESSMENT:  CLINICAL IMPRESSION: Patient is a 77 y.o. M who was seen today for assessment of speech clarity in light of PD. He tells SLP that his wife asks him to repeat and he notices when his voice gets hoarse and quiet but he is unable to modify it (see "patient goals", above). He also reports he is having difficulty with memory (more visual memory) compared to a year ago.   OBJECTIVE IMPAIRMENTS: Objective impairments include memory and dysarthria. These impairments are limiting patient from household  responsibilities, ADLs/IADLs, and effectively communicating at home and in community.Factors affecting potential to achieve goals and functional outcome are  none noted today .Marland Kitchen Patient will benefit from skilled SLP services to address above impairments and improve overall function.  REHAB POTENTIAL: Good  PLAN:  SLP FREQUENCY: 2x/week  SLP DURATION: 8 weeks  PLANNED INTERVENTIONS: Environmental controls, Functional tasks, Multimodal communication approach, SLP instruction and feedback, Compensatory strategies, and Patient/family education    [facility], CCC-SLP 05/31/2023, 5:40 PM

## 2023-05-31 NOTE — Telephone Encounter (Signed)
Provided 3 diagnosis codes for ZIO XT monitor from 10/17/2022.  They were I63.10 ,(original given dx code), I63.9 and R06.09.

## 2023-05-31 NOTE — Telephone Encounter (Signed)
Caller (Tiffany) wants a call back to confirm patient's Zio heart monitor updated diagnosis code.

## 2023-06-05 ENCOUNTER — Ambulatory Visit: Payer: Medicare Other

## 2023-06-05 DIAGNOSIS — R471 Dysarthria and anarthria: Secondary | ICD-10-CM | POA: Diagnosis not present

## 2023-06-05 DIAGNOSIS — R41841 Cognitive communication deficit: Secondary | ICD-10-CM

## 2023-06-05 DIAGNOSIS — R293 Abnormal posture: Secondary | ICD-10-CM | POA: Diagnosis not present

## 2023-06-05 DIAGNOSIS — R2689 Other abnormalities of gait and mobility: Secondary | ICD-10-CM | POA: Diagnosis not present

## 2023-06-05 DIAGNOSIS — M6281 Muscle weakness (generalized): Secondary | ICD-10-CM | POA: Diagnosis not present

## 2023-06-05 DIAGNOSIS — R2681 Unsteadiness on feet: Secondary | ICD-10-CM | POA: Diagnosis not present

## 2023-06-05 NOTE — Therapy (Signed)
OUTPATIENT SPEECH LANGUAGE PATHOLOGY [facility]   Patient Name: Joshua Browning MRN: [id number] DOB:1945/08/18, 77 y.o., male Today's Date: 06/05/2023  PCP: Daisy Floro, MD REFERRING PROVIDER: Kerin Salen, DO  END OF SESSION:  End of Session - 06/05/23 1452     Visit Number 2    Number of Visits 17    Date for SLP Re-Evaluation 08/11/23    SLP Start Time 1450    SLP Stop Time  1530    SLP Time Calculation (min) 40 min    Activity Tolerance Patient tolerated treatment well             Past Medical History:  Diagnosis Date   BPH (benign prostatic hyperplasia)    CVA (cerebral vascular accident) (HCC)    Foley catheter in place    removed after turp   GERD (gastroesophageal reflux disease)    Nocturnal leg cramps    occ   Parkinson's disease (HCC)    Urinary retention    Wears glasses    Past Surgical History:  Procedure Laterality Date   CATARACT EXTRACTION W/ INTRAOCULAR LENS  IMPLANT, BILATERAL Bilateral    INGUINAL HERNIA REPAIR Bilateral RIGHT x 2 1984 umbilical done with last right   left last one done 10 yrs ago   RE-DO RIGHT INGUINAL HERNIA REPAIR AND UMBILICAL HERNIA REPAIR  2001   REMOVAL LEFT EYE INTRAOCULAR LENS AND REPLACEMENT   08-29-2013   TRANSURETHRAL RESECTION OF PROSTATE N/A 09/23/2013   Procedure: TRANSURETHRAL RESECTION OF THE PROSTATE (TURP) WITH GYRUS;  Surgeon: Garnett Farm, MD;  Location: [facility] [place];  Service: Urology;  Laterality: N/A;   VARICOCELECTOMY Left 07/02/2018   Procedure: VARICOCELECTOMY;  Surgeon: Ihor Gully, MD;  Location: [facility];  Service: Urology;  Laterality: Left;   Patient Active Problem List   Diagnosis Date Noted   H/O: stroke 10/27/2022   Cerebrovascular accident (CVA) (HCC) 09/28/2022   Exertional dyspnea 09/28/2022   Parkinson's disease without dyskinesia, with fluctuating manifestations (HCC) 09/09/2022   Benign prostatic hyperplasia with urinary retention  09/23/2013    ONSET DATE: Script dated 05-25-23  REFERRING DIAG:  R47.1 (ICD-10-CM) - Dysarthria    THERAPY DIAG:  Dysarthria and anarthria  Cognitive communication deficit  Rationale for Evaluation and Treatment: Rehabilitation  SUBJECTIVE:   SUBJECTIVE STATEMENT: "It's a drastic change (between the sentences pt used louder speech and his talking)." Pt accompanied by: self  PERTINENT HISTORY:  PD, MCI, CLL, basal cell carcinoma, Ankle/feet paresthesias, CVA with field cut (09/2022)   PAIN:  Are you having pain? No  FALLS: Has patient fallen in last 6 months?  No   PATIENT GOALS: "Speak more clearly where I don't have that hoarseness, and better volume so that people don't ask me to repeat."  OBJECTIVE:  Note: Objective measures were completed at Evaluation unless otherwise noted.  DIAGNOSTIC FINDINGS:  Speech Therapy Parkinson's Disease Screen  May 2024   Decibel Level today: 66 dB  (WNL=70-72 dB) with sound level meter 30cm away from pt's mouth. Pt's conversational volume has decreased since last treatment course. Patient mentions that his communication is going well at home, with no overt communication challenges.  Pt does not report difficulty with swallowing, reporting improved swallow function since esophageal dilation. Ongoing challenges with street names and people's names as primary concern re: cognitive skills.  Pt would benefit from skilled ST to address dysarthria, however pt declines at this time. Recommend ST screen in another six months  PATIENT REPORTED OUTCOME MEASURES (PROM): Communication Effectiveness Survey: provided in first session (05/2123) - pt scored  himself 23/32, with lower scores demonstrating greater impact of speech deficit on effectiveness of communication. Very effective was conversation in a quiet place and for conversing with a familiar person on the telephone. Not at all effective was communicating in a noisy environment.   TODAY'S  TREATMENT:                                                                                                                                         DATE:  06/05/23: SLP provided CES today with pt scoring as above. SLP worked with pt again shAPing the loudness and mouth position (open mouth) with "ah". After 4 reps pt with "ah" with adequate volume and mouth posture at average 85 dB.  SLP instructed pt to read sentences using louder speech, and pt consistently not loud enough. When SLP utilized demo cues, pt attained 70dB, then SLP used digital recorder to incr pt's awareness of his softer than WNL volume and pt demonstrated he understood the difference by listening to recording (see "S" statement).  05/31/23: SLP shaped pt's loud /a/ over 7 minutes, and educated about the rationale for this activity for homework. SLP used digital recording to assist with this shaping, and the explanation for rationale of loud /a/. SLP educated pt about he will feel like he is talking TOO loudly and this is actually indication he is speaking at WNL volume.   PATIENT EDUCATION: Education details: see "today's treatment" Person educated: Patient Education method: Explanation, Demonstration, Verbal cues, and Handouts Education comprehension: verbalized understanding, returned demonstration, verbal cues required, and needs further education  HOME EXERCISE PROGRAM: 5 loud /a/ BID, to incr to 10 asap   GOALS: Goals reviewed with patient? Yes  SHORT TERM GOALS: Target date: 07/07/23  Pt will demo loud /a/ average mid 80s dB in 3 sessions Baseline: Goal status: INITIAL  2.  Pt will respond with sentences with average 70dB over 2 sessions Baseline:  Goal status: INITIAL  3.  Pt will produce 3 minutes simple conversation with average 70dB with nonverbal cues over three sessions  Baseline:  Goal status: INITIAL   LONG TERM GOALS: Target date: 08/11/23  Pt will maintain loud /a/ average in mid 80s dB in 3  sessions after 07/07/23 Baseline:  Goal status: INITIAL  2.  Pt will produce 10 minutes simple-mod complex conversation with average 70dB over three sessions  Baseline:  Goal status: INITIAL  3.  Pt will track appointment schedule and/or to-do lists via external cues during 3 sessions Baseline:  Goal status: INITIAL  4.  Pt will improve PROM score compared to initial administration Baseline:  Goal status: INITIAL   ASSESSMENT:  CLINICAL IMPRESSION: Patient is a 77 y.o. M who was seen today for treatment of speech clarity in light of PD. See "today's treatment"  for details.  He demonstrated understanding of the difference between his WNL (sub-WNL dB) speech and his normally loud speech with use of digital recorder. He tells SLP that his wife asks him to repeat and he notices when his voice gets hoarse and quiet but he is unable to modify it (see "patient goals", above). He also reports he is having difficulty with memory (more visual memory) compared to a year ago.   OBJECTIVE IMPAIRMENTS: Objective impairments include memory and dysarthria. These impairments are limiting patient from household responsibilities, ADLs/IADLs, and effectively communicating at home and in community.Factors affecting potential to achieve goals and functional outcome are  none noted today .Marland Kitchen Patient will benefit from skilled SLP services to address above impairments and improve overall function.  REHAB POTENTIAL: Good  PLAN:  SLP FREQUENCY: 2x/week  SLP DURATION: 8 weeks  PLANNED INTERVENTIONS: Environmental controls, Functional tasks, Multimodal communication approach, SLP instruction and feedback, Compensatory strategies, and Patient/family education    [facility], CCC-SLP 06/05/2023, 2:52 PM

## 2023-06-08 ENCOUNTER — Ambulatory Visit: Payer: Medicare Other

## 2023-06-08 DIAGNOSIS — R2689 Other abnormalities of gait and mobility: Secondary | ICD-10-CM | POA: Diagnosis not present

## 2023-06-08 DIAGNOSIS — R41841 Cognitive communication deficit: Secondary | ICD-10-CM

## 2023-06-08 DIAGNOSIS — R293 Abnormal posture: Secondary | ICD-10-CM | POA: Diagnosis not present

## 2023-06-08 DIAGNOSIS — R471 Dysarthria and anarthria: Secondary | ICD-10-CM | POA: Diagnosis not present

## 2023-06-08 DIAGNOSIS — M6281 Muscle weakness (generalized): Secondary | ICD-10-CM | POA: Diagnosis not present

## 2023-06-08 DIAGNOSIS — R2681 Unsteadiness on feet: Secondary | ICD-10-CM | POA: Diagnosis not present

## 2023-06-08 NOTE — Therapy (Signed)
OUTPATIENT SPEECH LANGUAGE PATHOLOGY [facility]   Patient Name: Joshua Browning MRN: [id number] DOB:05/23/1946, 77 y.o., male Today's Date: 06/08/2023  PCP: Daisy Floro, MD REFERRING PROVIDER: Kerin Salen, DO  END OF SESSION:  End of Session - 06/08/23 1440     Visit Number 3    Number of Visits 17    Date for SLP Re-Evaluation 08/11/23    SLP Start Time 1449    SLP Stop Time  1530    SLP Time Calculation (min) 41 min    Activity Tolerance Patient tolerated treatment well              Past Medical History:  Diagnosis Date   BPH (benign prostatic hyperplasia)    CVA (cerebral vascular accident) (HCC)    Foley catheter in place    removed after turp   GERD (gastroesophageal reflux disease)    Nocturnal leg cramps    occ   Parkinson's disease (HCC)    Urinary retention    Wears glasses    Past Surgical History:  Procedure Laterality Date   CATARACT EXTRACTION W/ INTRAOCULAR LENS  IMPLANT, BILATERAL Bilateral    INGUINAL HERNIA REPAIR Bilateral RIGHT x 2 1984 umbilical done with last right   left last one done 10 yrs ago   RE-DO RIGHT INGUINAL HERNIA REPAIR AND UMBILICAL HERNIA REPAIR  2001   REMOVAL LEFT EYE INTRAOCULAR LENS AND REPLACEMENT   08-29-2013   TRANSURETHRAL RESECTION OF PROSTATE N/A 09/23/2013   Procedure: TRANSURETHRAL RESECTION OF THE PROSTATE (TURP) WITH GYRUS;  Surgeon: Garnett Farm, MD;  Location: [facility] [place];  Service: Urology;  Laterality: N/A;   VARICOCELECTOMY Left 07/02/2018   Procedure: VARICOCELECTOMY;  Surgeon: Ihor Gully, MD;  Location: [facility];  Service: Urology;  Laterality: Left;   Patient Active Problem List   Diagnosis Date Noted   H/O: stroke 10/27/2022   Cerebrovascular accident (CVA) (HCC) 09/28/2022   Exertional dyspnea 09/28/2022   Parkinson's disease without dyskinesia, with fluctuating manifestations (HCC) 09/09/2022   Benign prostatic hyperplasia with urinary  retention 09/23/2013    ONSET DATE: Script dated 05-25-23  REFERRING DIAG:  R47.1 (ICD-10-CM) - Dysarthria    THERAPY DIAG:  Dysarthria and anarthria  Cognitive communication deficit  Rationale for Evaluation and Treatment: Rehabilitation  SUBJECTIVE:   SUBJECTIVE STATEMENT: "When I get to talking (in conversation) I don't feel louder anymore." Pt accompanied by: self  PERTINENT HISTORY:  PD, MCI, CLL, basal cell carcinoma, Ankle/feet paresthesias, CVA with field cut (09/2022)   PAIN:  Are you having pain? No  FALLS: Has patient fallen in last 6 months?  No   PATIENT GOALS: "Speak more clearly where I don't have that hoarseness, and better volume so that people don't ask me to repeat."  OBJECTIVE:  Note: Objective measures were completed at Evaluation unless otherwise noted.  DIAGNOSTIC FINDINGS:  Speech Therapy Parkinson's Disease Screen  May 2024   Decibel Level today: 66 dB  (WNL=70-72 dB) with sound level meter 30cm away from pt's mouth. Pt's conversational volume has decreased since last treatment course. Patient mentions that his communication is going well at home, with no overt communication challenges.  Pt does not report difficulty with swallowing, reporting improved swallow function since esophageal dilation. Ongoing challenges with street names and people's names as primary concern re: cognitive skills.  Pt would benefit from skilled ST to address dysarthria, however pt declines at this time. Recommend ST screen in another six months  PATIENT REPORTED OUTCOME MEASURES (PROM): Communication Effectiveness Survey: provided in first session (05/2123) - pt scored  himself 23/32, with lower scores demonstrating greater impact of speech deficit on effectiveness of communication. Very effective was conversation in a quiet place and for conversing with a familiar person on the telephone. Not at all effective was communicating in a noisy environment.   TODAY'S  TREATMENT:                                                                                                                                         DATE:  06/08/23: Loud /a/ completed with average 86dB with cues to extend /a/ length initially. Conversation after loud /a/ started at average 69dB then after 30 seconds decr'd to average 65dB. SLP worked with pt reading sentences with WNL volume. He req'd digital recorder cues to improve his loudness but was average 71 dB following this. In sentence completion tasks he was producing speech with average 68 dB with incr'd cognitive load necessary to complete sentences.   06/05/23: SLP provided CES today with pt scoring as above. SLP worked with pt again shAPing the loudness and mouth position (open mouth) with "ah". After 4 reps pt with "ah" with adequate volume and mouth posture at average 85 dB.  SLP instructed pt to read sentences using louder speech, and pt consistently not loud enough. When SLP utilized demo cues, pt attained 70dB, then SLP used digital recorder to incr pt's awareness of his softer than WNL volume and pt demonstrated he understood the difference by listening to recording (see "S" statement).  05/31/23: SLP shaped pt's loud /a/ over 7 minutes, and educated about the rationale for this activity for homework. SLP used digital recording to assist with this shaping, and the explanation for rationale of loud /a/. SLP educated pt about he will feel like he is talking TOO loudly and this is actually indication he is speaking at WNL volume.   PATIENT EDUCATION: Education details: see "today's treatment" Person educated: Patient Education method: Explanation, Demonstration, Verbal cues, and Handouts Education comprehension: verbalized understanding, returned demonstration, verbal cues required, and needs further education  HOME EXERCISE PROGRAM: 10 loud /a/, read 15 sentences, complete one worksheet with loud/WNL loud voice   GOALS: Goals  reviewed with patient? Yes  SHORT TERM GOALS: Target date: 07/07/23  Pt will demo loud /a/ average mid 80s dB in 3 sessions Baseline:06/05/23, 06/08/23 Goal status: INITIAL  2.  Pt will respond with sentences with average 70dB over 2 sessions Baseline:  Goal status: INITIAL  3.  Pt will produce 3 minutes simple conversation with average 70dB with nonverbal cues over three sessions  Baseline:  Goal status: INITIAL   LONG TERM GOALS: Target date: 08/11/23  Pt will maintain loud /a/ average in mid 80s dB in 3 sessions after 07/07/23 Baseline:  Goal status: INITIAL  2.  Pt will produce 10 minutes  simple-mod complex conversation with average 70dB over three sessions  Baseline:  Goal status: INITIAL  3.  Pt will track appointment schedule and/or to-do lists via external cues during 3 sessions Baseline:  Goal status: INITIAL  4.  Pt will improve PROM score compared to initial administration Baseline:  Goal status: INITIAL   ASSESSMENT:  CLINICAL IMPRESSION: Patient is a 77 y.o. M who was seen today for treatment of speech clarity in light of PD. See "today's treatment" for details.  He demonstrated understanding of the difference between his WNL (sub-WNL dB) speech and his normally loud speech with use of digital recorder. He tells SLP that his wife asks him to repeat and he notices when his voice gets hoarse and quiet but he is unable to modify it (see "patient goals", above). He also reports he is having difficulty with memory (more visual memory) compared to a year ago.   OBJECTIVE IMPAIRMENTS: Objective impairments include memory and dysarthria. These impairments are limiting patient from household responsibilities, ADLs/IADLs, and effectively communicating at home and in community.Factors affecting potential to achieve goals and functional outcome are  none noted today .Marland Kitchen Patient will benefit from skilled SLP services to address above impairments and improve overall  function.  REHAB POTENTIAL: Good  PLAN:  SLP FREQUENCY: 2x/week  SLP DURATION: 8 weeks  PLANNED INTERVENTIONS: Environmental controls, Functional tasks, Multimodal communication approach, SLP instruction and feedback, Compensatory strategies, and Patient/family education    [facility], CCC-SLP 06/08/2023, 3:17 PM

## 2023-06-12 ENCOUNTER — Ambulatory Visit: Payer: Medicare Other | Attending: Neurology

## 2023-06-12 DIAGNOSIS — R471 Dysarthria and anarthria: Secondary | ICD-10-CM | POA: Diagnosis not present

## 2023-06-12 DIAGNOSIS — R41841 Cognitive communication deficit: Secondary | ICD-10-CM | POA: Diagnosis present

## 2023-06-12 NOTE — Therapy (Signed)
OUTPATIENT SPEECH LANGUAGE PATHOLOGY [facility]   Patient Name: Joshua Browning MRN: [id number] DOB:04-Nov-1945, 77 y.o., male Today's Date: 06/12/2023  PCP: Daisy Floro, MD REFERRING PROVIDER: Kerin Salen, DO  END OF SESSION:  End of Session - 06/12/23 1447     Visit Number 4    Number of Visits 17    Date for SLP Re-Evaluation 08/11/23    SLP Start Time 1405    SLP Stop Time  1445    SLP Time Calculation (min) 40 min    Activity Tolerance Patient tolerated treatment well               Past Medical History:  Diagnosis Date   BPH (benign prostatic hyperplasia)    CVA (cerebral vascular accident) (HCC)    Foley catheter in place    removed after turp   GERD (gastroesophageal reflux disease)    Nocturnal leg cramps    occ   Parkinson's disease (HCC)    Urinary retention    Wears glasses    Past Surgical History:  Procedure Laterality Date   CATARACT EXTRACTION W/ INTRAOCULAR LENS  IMPLANT, BILATERAL Bilateral    INGUINAL HERNIA REPAIR Bilateral RIGHT x 2 1984 umbilical done with last right   left last one done 10 yrs ago   RE-DO RIGHT INGUINAL HERNIA REPAIR AND UMBILICAL HERNIA REPAIR  2001   REMOVAL LEFT EYE INTRAOCULAR LENS AND REPLACEMENT   08-29-2013   TRANSURETHRAL RESECTION OF PROSTATE N/A 09/23/2013   Procedure: TRANSURETHRAL RESECTION OF THE PROSTATE (TURP) WITH GYRUS;  Surgeon: Garnett Farm, MD;  Location: [facility] [place];  Service: Urology;  Laterality: N/A;   VARICOCELECTOMY Left 07/02/2018   Procedure: VARICOCELECTOMY;  Surgeon: Ihor Gully, MD;  Location: [facility];  Service: Urology;  Laterality: Left;   Patient Active Problem List   Diagnosis Date Noted   H/O: stroke 10/27/2022   Cerebrovascular accident (CVA) (HCC) 09/28/2022   Exertional dyspnea 09/28/2022   Parkinson's disease without dyskinesia, with fluctuating manifestations (HCC) 09/09/2022   Benign prostatic hyperplasia with urinary  retention 09/23/2013    ONSET DATE: Script dated 05-25-23  REFERRING DIAG:  R47.1 (ICD-10-CM) - Dysarthria    THERAPY DIAG:  Dysarthria and anarthria  Cognitive communication deficit  Rationale for Evaluation and Treatment: Rehabilitation  SUBJECTIVE:   SUBJECTIVE STATEMENT: "I'm using the things you taught me." Pt accompanied by: self  PERTINENT HISTORY:  PD, MCI, CLL, basal cell carcinoma, Ankle/feet paresthesias, CVA with field cut (09/2022)   PAIN:  Are you having pain? No  FALLS: Has patient fallen in last 6 months?  No   PATIENT GOALS: "Speak more clearly where I don't have that hoarseness, and better volume so that people don't ask me to repeat."  OBJECTIVE:  Note: Objective measures were completed at Evaluation unless otherwise noted.  DIAGNOSTIC FINDINGS:  Speech Therapy Parkinson's Disease Screen  May 2024   Decibel Level today: 66 dB  (WNL=70-72 dB) with sound level meter 30cm away from pt's mouth. Pt's conversational volume has decreased since last treatment course. Patient mentions that his communication is going well at home, with no overt communication challenges.  Pt does not report difficulty with swallowing, reporting improved swallow function since esophageal dilation. Ongoing challenges with street names and people's names as primary concern re: cognitive skills.  Pt would benefit from skilled ST to address dysarthria, however pt declines at this time. Recommend ST screen in another six months   PATIENT REPORTED OUTCOME MEASURES (  PROM): Communication Effectiveness Survey: provided in first session (06/05/23) - pt scored  himself 23/32, with lower scores demonstrating greater impact of speech deficit on effectiveness of communication. Very effective was conversation in a quiet place and for conversing with a familiar person on the telephone. Not at all effective was communicating in a noisy environment.   TODAY'S TREATMENT:                                                                                                                                          DATE:  06/12/23: Today SLP assessed pt's voice in conversation for 18 minutes; average 68 dB with occasional min A. Conversation for 14 minutes averaged 67dB with occasional min A for loudness. SLP stressed to pt to cont to practice at home.   06/08/23: Loud /a/ completed with average 86dB with cues to extend /a/ length initially. Conversation after loud /a/ started at average 69dB then after 30 seconds decr'd to average 65dB. SLP worked with pt reading sentences with WNL volume. He req'd digital recorder cues to improve his loudness but was average 71 dB following this. In sentence completion tasks he was producing speech with average 68 dB with incr'd cognitive load necessary to complete sentences.   06/05/23: SLP provided CES today with pt scoring as above. SLP worked with pt again shAPing the loudness and mouth position (open mouth) with "ah". After 4 reps pt with "ah" with adequate volume and mouth posture at average 85 dB.  SLP instructed pt to read sentences using louder speech, and pt consistently not loud enough. When SLP utilized demo cues, pt attained 70dB, then SLP used digital recorder to incr pt's awareness of his softer than WNL volume and pt demonstrated he understood the difference by listening to recording (see "S" statement).  05/31/23: SLP shaped pt's loud /a/ over 7 minutes, and educated about the rationale for this activity for homework. SLP used digital recording to assist with this shaping, and the explanation for rationale of loud /a/. SLP educated pt about he will feel like he is talking TOO loudly and this is actually indication he is speaking at WNL volume.   PATIENT EDUCATION: Education details: see "today's treatment" Person educated: Patient Education method: Explanation, Demonstration, Verbal cues, and Handouts Education comprehension: verbalized understanding,  returned demonstration, verbal cues required, and needs further education  HOME EXERCISE PROGRAM: 10 loud /a/, read 15 sentences, complete one worksheet with loud/WNL loud voice   GOALS: Goals reviewed with patient? Yes  SHORT TERM GOALS: Target date: 07/07/23  Pt will demo loud /a/ average mid 80s dB in 3 sessions Baseline:06/05/23, 06/08/23 Goal status: Met  2.  Pt will respond with sentences with average 70dB over 2 sessions Baseline:  Goal status: INITIAL  3.  Pt will produce 3 minutes simple conversation with average 70dB with nonverbal cues over three sessions  Baseline:  Goal status:  INITIAL   LONG TERM GOALS: Target date: 08/11/23  Pt will maintain loud /a/ average in mid 80s dB in 3 sessions after 07/07/23 Baseline:  Goal status: INITIAL  2.  Pt will produce 10 minutes simple-mod complex conversation with average 70dB over three sessions  Baseline:  Goal status: INITIAL  3.  Pt will track appointment schedule and/or to-do lists via external cues during 3 sessions Baseline:  Goal status: INITIAL  4.  Pt will improve PROM score compared to initial administration Baseline:  Goal status: INITIAL   ASSESSMENT:  CLINICAL IMPRESSION: Patient is a 77 y.o. M who was seen today for treatment of speech clarity in light of PD. See "today's treatment" for details.  He demonstrated understanding of the difference between his WNL (sub-WNL dB) speech and his normally loud speech with use of digital recorder. He tells SLP that his wife asks him to repeat and he notices when his voice gets hoarse and quiet but he is unable to modify it (see "patient goals", above). He also reports he is having difficulty with memory (more visual memory) compared to a year ago.   OBJECTIVE IMPAIRMENTS: Objective impairments include memory and dysarthria. These impairments are limiting patient from household responsibilities, ADLs/IADLs, and effectively communicating at home and in  community.Factors affecting potential to achieve goals and functional outcome are  none noted today .Marland Kitchen Patient will benefit from skilled SLP services to address above impairments and improve overall function.  REHAB POTENTIAL: Good  PLAN:  SLP FREQUENCY: 2x/week  SLP DURATION: 8 weeks  PLANNED INTERVENTIONS: Environmental controls, Functional tasks, Multimodal communication approach, SLP instruction and feedback, Compensatory strategies, and Patient/family education    [facility], CCC-SLP 06/12/2023, 2:47 PM

## 2023-06-13 ENCOUNTER — Other Ambulatory Visit (HOSPITAL_BASED_OUTPATIENT_CLINIC_OR_DEPARTMENT_OTHER): Payer: Self-pay

## 2023-06-13 MED ORDER — INFLUENZA VAC A&B SURF ANT ADJ 0.5 ML IM SUSY
0.5000 mL | PREFILLED_SYRINGE | Freq: Once | INTRAMUSCULAR | 0 refills | Status: AC
  Filled 2023-06-13: qty 0.5, 1d supply, fill #0

## 2023-06-15 ENCOUNTER — Ambulatory Visit: Payer: Medicare Other

## 2023-06-15 DIAGNOSIS — R41841 Cognitive communication deficit: Secondary | ICD-10-CM

## 2023-06-15 DIAGNOSIS — R471 Dysarthria and anarthria: Secondary | ICD-10-CM

## 2023-06-15 NOTE — Patient Instructions (Signed)
Everyday sentences::  1)_   2)   3)   4)   5)   6)   7)   8)   9)   10)

## 2023-06-26 ENCOUNTER — Ambulatory Visit: Payer: Medicare Other | Attending: Neurology

## 2023-06-26 DIAGNOSIS — Z79899 Other long term (current) drug therapy: Secondary | ICD-10-CM | POA: Diagnosis not present

## 2023-06-26 DIAGNOSIS — R471 Dysarthria and anarthria: Secondary | ICD-10-CM | POA: Insufficient documentation

## 2023-06-26 DIAGNOSIS — R41841 Cognitive communication deficit: Secondary | ICD-10-CM | POA: Insufficient documentation

## 2023-06-26 DIAGNOSIS — E7800 Pure hypercholesterolemia, unspecified: Secondary | ICD-10-CM | POA: Diagnosis not present

## 2023-06-26 NOTE — Therapy (Signed)
OUTPATIENT SPEECH LANGUAGE PATHOLOGY [facility]   Patient Name: Joshua Browning MRN: [id number] DOB:[birth year], 77 y.o., male Today's Date: 06/26/2023  PCP: Daisy Floro, MD REFERRING PROVIDER: Kerin Salen, DO  END OF SESSION:  End of Session - 06/26/23 1536     Visit Number 6    Number of Visits 17    Date for SLP Re-Evaluation 08/11/23    SLP Start Time 1531    SLP Stop Time  1612    SLP Time Calculation (min) 41 min    Activity Tolerance Patient tolerated treatment well               Past Medical History:  Diagnosis Date   BPH (benign prostatic hyperplasia)    CVA (cerebral vascular accident) (HCC)    Foley catheter in place    removed after turp   GERD (gastroesophageal reflux disease)    Nocturnal leg cramps    occ   Parkinson's disease (HCC)    Urinary retention    Wears glasses    Past Surgical History:  Procedure Laterality Date   CATARACT EXTRACTION W/ INTRAOCULAR LENS  IMPLANT, BILATERAL Bilateral    INGUINAL HERNIA REPAIR Bilateral RIGHT x 2 1984 umbilical done with last right   left last one done 10 yrs ago   RE-DO RIGHT INGUINAL HERNIA REPAIR AND UMBILICAL HERNIA REPAIR  2001   REMOVAL LEFT EYE INTRAOCULAR LENS AND REPLACEMENT   08-29-2013   TRANSURETHRAL RESECTION OF PROSTATE N/A 09/23/2013   Procedure: TRANSURETHRAL RESECTION OF THE PROSTATE (TURP) WITH GYRUS;  Surgeon: Garnett Farm, MD;  Location: [facility];  Service: Urology;  Laterality: N/A;   VARICOCELECTOMY Left 07/02/2018   Procedure: VARICOCELECTOMY;  Surgeon: Ihor Gully, MD;  Location: [facility];  Service: Urology;  Laterality: Left;   Patient Active Problem List   Diagnosis Date Noted   H/O: stroke 10/27/2022   Cerebrovascular accident (CVA) (HCC) 09/28/2022   Exertional dyspnea 09/28/2022   Parkinson's disease without dyskinesia, with fluctuating manifestations (HCC) 09/09/2022   Benign prostatic hyperplasia with urinary  retention 09/23/2013    ONSET DATE: Script dated 05-25-23  REFERRING DIAG:  R47.1 (ICD-10-CM) - Dysarthria    THERAPY DIAG:  Dysarthria and anarthria  Cognitive communication deficit  Rationale for Evaluation and Treatment: Rehabilitation  SUBJECTIVE:   SUBJECTIVE STATEMENT: "There were so many people that visited (on vacation)." Pt accompanied by: self  PERTINENT HISTORY:  PD, MCI, CLL, basal cell carcinoma, Ankle/feet paresthesias, CVA with field cut (09/2022)   PAIN:  Are you having pain? No  FALLS: Has patient fallen in last 6 months?  No   PATIENT GOALS: "Speak more clearly where I don't have that hoarseness, and better volume so that people don't ask me to repeat."  OBJECTIVE:  Note: Objective measures were completed at Evaluation unless otherwise noted.  DIAGNOSTIC FINDINGS:  Speech Therapy Parkinson's Disease Screen  May 2024   Decibel Level today: 66 dB  (WNL=70-72 dB) with sound level meter 30cm away from pt's mouth. Pt's conversational volume has decreased since last treatment course. Patient mentions that his communication is going well at home, with no overt communication challenges.  Pt does not report difficulty with swallowing, reporting improved swallow function since esophageal dilation. Ongoing challenges with street names and people's names as primary concern re: cognitive skills.  Pt would benefit from skilled ST to address dysarthria, however pt declines at this time. Recommend ST screen in another six months   PATIENT REPORTED  OUTCOME MEASURES (PROM): Communication Effectiveness Survey: provided in first session (06/05/23) - pt scored  himself 23/32, with lower scores demonstrating greater impact of speech deficit on effectiveness of communication. Very effective was conversation in a quiet place and for conversing with a familiar person on the telephone. Not at all effective was communicating in a noisy environment.   TODAY'S TREATMENT:                                                                                                                                          DATE:  06/26/23: Since last session, pt completed loud /a/ three times. SLP targeted loud /a/ to improve pt's muscle memory for louder speech in conversation. Average was 87dB, with initial cue to extend production. Everyday sentences were brought today but pt will have to revamp 4 of them as they are not utterances he would say everyday. Today his average was 78dB with these sentences. In 9 minutes of simple conversation pt maintained loudness average of 68dB. SLP provided pt with homework for 15-20 minutes twice a day after loud /a/ BID and everyday sentences BID.  06/15/23: SLP targeted loud /a/ to increase muscle memory for louder speech in conversation. Average was 87dB, with initial cue to extend production. In 20 minutes of conversation pt averaged 68dB, with rare min A for incr'd abdominal recruitment and effort/loudness. SLP reiterated pt's need to practice with louder voice at home.  06/12/23: Today SLP assessed pt's voice in conversation for 18 minutes; average 68 dB with occasional min A. Conversation for 14 minutes averaged 67dB with occasional min A for loudness. SLP stressed to pt to cont to practice at home.   06/08/23: Loud /a/ completed with average 86dB with cues to extend /a/ length initially. Conversation after loud /a/ started at average 69dB then after 30 seconds decr'd to average 65dB. SLP worked with pt reading sentences with WNL volume. He req'd digital recorder cues to improve his loudness but was average 71 dB following this. In sentence completion tasks he was producing speech with average 68 dB with incr'd cognitive load necessary to complete sentences.   06/05/23: SLP provided CES today with pt scoring as above. SLP worked with pt again shAPing the loudness and mouth position (open mouth) with "ah". After 4 reps pt with "ah" with adequate volume and  mouth posture at average 85 dB.  SLP instructed pt to read sentences using louder speech, and pt consistently not loud enough. When SLP utilized demo cues, pt attained 70dB, then SLP used digital recorder to incr pt's awareness of his softer than WNL volume and pt demonstrated he understood the difference by listening to recording (see "S" statement).  05/31/23: SLP shaped pt's loud /a/ over 7 minutes, and educated about the rationale for this activity for homework. SLP used digital recording to assist with this shaping, and the explanation for rationale of loud /  a/. SLP educated pt about he will feel like he is talking TOO loudly and this is actually indication he is speaking at [facility] volume.   PATIENT EDUCATION: Education details: see "today's treatment" Person educated: Patient Education method: Explanation, Demonstration, Verbal cues, and Handouts Education comprehension: verbalized understanding, returned demonstration, verbal cues required, and needs further education  HOME EXERCISE PROGRAM: 10 loud /a/, read 15 sentences, complete one worksheet with loud/WNL loud voice   GOALS: Goals reviewed with patient? Yes  SHORT TERM GOALS: Target date: 07/07/23  Pt will demo loud /a/ average mid 80s dB in 3 sessions Baseline:06/05/23, 06/08/23 Goal status: Met  2.  Pt will respond with sentences with average 70dB over 2 sessions Baseline: 06/15/23 Goal status: INITIAL  3.  Pt will produce 3 minutes simple conversation with average 70dB with nonverbal cues over three sessions  Baseline:  Goal status: INITIAL   LONG TERM GOALS: Target date: 08/11/23  Pt will maintain loud /a/ average in mid 80s dB in 3 sessions after 07/07/23 Baseline:  Goal status: INITIAL  2.  Pt will produce 10 minutes simple-mod complex conversation with average 70dB over three sessions  Baseline:  Goal status: INITIAL  3.  Pt will track appointment schedule and/or to-do lists via external cues during 3  sessions Baseline:  Goal status: INITIAL  4.  Pt will improve PROM score compared to initial administration Baseline:  Goal status: INITIAL  5.  Pt will successfully use memory strategy between 3 sesions Baseline:  Goal status: INITIAL   ASSESSMENT:  CLINICAL IMPRESSION: Patient is a 77 y.o. M who was seen today for treatment of speech clarity in light of PD. See "today's treatment" for details.  He cont to tell SLP that his wife asks him to repeat and he notices when his voice gets hoarse and quiet. He also reports he is having difficulty with memory (more visual memory) compared to a year ago.   OBJECTIVE IMPAIRMENTS: Objective impairments include memory and dysarthria. These impairments are limiting patient from household responsibilities, ADLs/IADLs, and effectively communicating at home and in community.Factors affecting potential to achieve goals and functional outcome are  none noted today .Marland Kitchen Patient will benefit from skilled SLP services to address above impairments and improve overall function.  REHAB POTENTIAL: Good  PLAN:  SLP FREQUENCY: 2x/week  SLP DURATION: 8 weeks  PLANNED INTERVENTIONS: Environmental controls, Functional tasks, Multimodal communication approach, SLP instruction and feedback, Compensatory strategies, and Patient/family education    [facility], CCC-SLP 06/26/2023, 3:37 PM

## 2023-06-29 ENCOUNTER — Ambulatory Visit: Payer: Medicare Other

## 2023-06-29 DIAGNOSIS — R471 Dysarthria and anarthria: Secondary | ICD-10-CM

## 2023-06-29 DIAGNOSIS — R41841 Cognitive communication deficit: Secondary | ICD-10-CM

## 2023-06-29 NOTE — Therapy (Signed)
OUTPATIENT SPEECH LANGUAGE PATHOLOGY [facility]   Patient Name: Joshua Browning MRN: [id number] DOB:1946-03-10, 77 y.o., male Today's Date: 06/29/2023  PCP: Daisy Floro, MD REFERRING PROVIDER: Kerin Salen, DO  END OF SESSION:  End of Session - 06/29/23 1540     Visit Number 7    Number of Visits 17    Date for SLP Re-Evaluation 08/11/23    SLP Start Time 1525    SLP Stop Time  1608    SLP Time Calculation (min) 43 min    Activity Tolerance Patient tolerated treatment well               Past Medical History:  Diagnosis Date   BPH (benign prostatic hyperplasia)    CVA (cerebral vascular accident) (HCC)    Foley catheter in place    removed after turp   GERD (gastroesophageal reflux disease)    Nocturnal leg cramps    occ   Parkinson's disease (HCC)    Urinary retention    Wears glasses    Past Surgical History:  Procedure Laterality Date   CATARACT EXTRACTION W/ INTRAOCULAR LENS  IMPLANT, BILATERAL Bilateral    INGUINAL HERNIA REPAIR Bilateral RIGHT x 2 1984 umbilical done with last right   left last one done 10 yrs ago   RE-DO RIGHT INGUINAL HERNIA REPAIR AND UMBILICAL HERNIA REPAIR  2001   REMOVAL LEFT EYE INTRAOCULAR LENS AND REPLACEMENT   08-29-2013   TRANSURETHRAL RESECTION OF PROSTATE N/A 09/23/2013   Procedure: TRANSURETHRAL RESECTION OF THE PROSTATE (TURP) WITH GYRUS;  Surgeon: Garnett Farm, MD;  Location: [facility] ;  Service: Urology;  Laterality: N/A;   VARICOCELECTOMY Left 07/02/2018   Procedure: VARICOCELECTOMY;  Surgeon: Ihor Gully, MD;  Location: [facility];  Service: Urology;  Laterality: Left;   Patient Active Problem List   Diagnosis Date Noted   H/O: stroke 10/27/2022   Cerebrovascular accident (CVA) (HCC) 09/28/2022   Exertional dyspnea 09/28/2022   Parkinson's disease without dyskinesia, with fluctuating manifestations (HCC) 09/09/2022   Benign prostatic hyperplasia with urinary  retention 09/23/2013    ONSET DATE: Script dated 05-25-23  REFERRING DIAG:  R47.1 (ICD-10-CM) - Dysarthria    THERAPY DIAG:  Dysarthria and anarthria  Cognitive communication deficit  Rationale for Evaluation and Treatment: Rehabilitation  SUBJECTIVE:   SUBJECTIVE STATEMENT: "There were so many people that visited (on vacation)." Pt accompanied by: self  PERTINENT HISTORY:  PD, MCI, CLL, basal cell carcinoma, Ankle/feet paresthesias, CVA with field cut (09/2022)   PAIN:  Are you having pain? No  FALLS: Has patient fallen in last 6 months?  No   PATIENT GOALS: "Speak more clearly where I don't have that hoarseness, and better volume so that people don't ask me to repeat."  OBJECTIVE:  Note: Objective measures were completed at Evaluation unless otherwise noted.  DIAGNOSTIC FINDINGS:  Speech Therapy Parkinson's Disease Screen  May 2024   Decibel Level today: 66 dB  (WNL=70-72 dB) with sound level meter 30cm away from pt's mouth. Pt's conversational volume has decreased since last treatment course. Patient mentions that his communication is going well at home, with no overt communication challenges.  Pt does not report difficulty with swallowing, reporting improved swallow function since esophageal dilation. Ongoing challenges with street names and people's names as primary concern re: cognitive skills.  Pt would benefit from skilled ST to address dysarthria, however pt declines at this time. Recommend ST screen in another six months   PATIENT REPORTED  OUTCOME MEASURES (PROM): Communication Effectiveness Survey: provided in first session (06/05/23) - pt scored  himself 23/32, with lower scores demonstrating greater impact of speech deficit on effectiveness of communication. Very effective was conversation in a quiet place and for conversing with a familiar person on the telephone. Not at all effective was communicating in a noisy environment.   TODAY'S TREATMENT:                                                                                                                                          DATE:  06/29/23:Loud /a/ and everyday sentences have been completed 4-5 times since last session. Pt now has all sentences those that he may say everyday. SLP targeted loud /a/ to increase muscle memory for louder speech in conversation. Average was 87dB, with initial cue to improve loudness. Pt counted first 6 reps on fingers, which assisted his length of production. Sentences were produced with average 82 dB. In 15 minutes of conversation pt req'd occasional min A for increasing volume to WNL.   06/26/23: Since last session, pt completed loud /a/ three times. SLP targeted loud /a/ to improve pt's muscle memory for louder speech in conversation. Average was 87dB, with initial cue to extend production. Everyday sentences were brought today but pt will have to revamp 4 of them as they are not utterances he would say everyday. Today his average was 78dB with these sentences. In 9 minutes of simple conversation pt maintained loudness average of 68dB. SLP provided pt with homework for 15-20 minutes twice a day after loud /a/ BID and everyday sentences BID.  06/15/23: SLP targeted loud /a/ to increase muscle memory for louder speech in conversation. Average was 87dB, with initial cue to extend production. In 20 minutes of conversation pt averaged 68dB, with rare min A for incr'd abdominal recruitment and effort/loudness. SLP reiterated pt's need to practice with louder voice at home.  06/12/23: Today SLP assessed pt's voice in conversation for 18 minutes; average 68 dB with occasional min A. Conversation for 14 minutes averaged 67dB with occasional min A for loudness. SLP stressed to pt to cont to practice at home.   06/08/23: Loud /a/ completed with average 86dB with cues to extend /a/ length initially. Conversation after loud /a/ started at average 69dB then after 30 seconds decr'd to  average 65dB. SLP worked with pt reading sentences with WNL volume. He req'd digital recorder cues to improve his loudness but was average 71 dB following this. In sentence completion tasks he was producing speech with average 68 dB with incr'd cognitive load necessary to complete sentences.   06/05/23: SLP provided CES today with pt scoring as above. SLP worked with pt again shAPing the loudness and mouth position (open mouth) with "ah". After 4 reps pt with "ah" with adequate volume and mouth posture at average 85 dB.  SLP instructed pt to read  sentences using louder speech, and pt consistently not loud enough. When SLP utilized demo cues, pt attained 70dB, then SLP used digital recorder to incr pt's awareness of his softer than WNL volume and pt demonstrated he understood the difference by listening to recording (see "S" statement).  05/31/23: SLP shaped pt's loud /a/ over 7 minutes, and educated about the rationale for this activity for homework. SLP used digital recording to assist with this shaping, and the explanation for rationale of loud /a/. SLP educated pt about he will feel like he is talking TOO loudly and this is actually indication he is speaking at WNL volume.   PATIENT EDUCATION: Education details: see "today's treatment" Person educated: Patient Education method: Explanation, Demonstration, and Verbal cues Education comprehension: verbalized understanding, returned demonstration, verbal cues required, and needs further education  HOME EXERCISE PROGRAM: 10 loud /a/, read 15 sentences, complete one worksheet with loud/WNL loud voice   GOALS: Goals reviewed with patient? Yes  SHORT TERM GOALS: Target date: 07/07/23  Pt will demo loud /a/ average mid 80s dB in 3 sessions Baseline:06/05/23, 06/08/23 Goal status: Met  2.  Pt will respond with sentences with average 70dB over 2 sessions Baseline: 06/15/23 Goal status: met  3.  Pt will produce 3 minutes simple conversation  with average 70dB with nonverbal cues over three sessions  Baseline: 06/29/23 Goal status: INITIAL   LONG TERM GOALS: Target date: 08/11/23  Pt will maintain loud /a/ average in mid 80s dB in 3 sessions after 07/07/23 Baseline:  Goal status: INITIAL  2.  Pt will produce 10 minutes simple-mod complex conversation with average 70dB over three sessions  Baseline: 06/29/23 Goal status: INITIAL  3.  Pt will track appointment schedule and/or to-do lists via external cues during 3 sessions Baseline:  Goal status: INITIAL  4.  Pt will improve PROM score compared to initial administration Baseline:  Goal status: INITIAL  5.  Pt will successfully use memory strategy between 3 sesions Baseline:  Goal status: INITIAL   ASSESSMENT:  CLINICAL IMPRESSION: Patient is a 77 y.o. M who was seen today for treatment of speech clarity in light of PD. See "today's treatment" for details.  He states now his wife is no longer asking him to repeat at the same frequency as prior to ST. He also reports he is having difficulty with memory (more visual memory) compared to a year ago.   OBJECTIVE IMPAIRMENTS: Objective impairments include memory and dysarthria. These impairments are limiting patient from household responsibilities, ADLs/IADLs, and effectively communicating at home and in community.Factors affecting potential to achieve goals and functional outcome are  none noted today .Marland Kitchen Patient will benefit from skilled SLP services to address above impairments and improve overall function.  REHAB POTENTIAL: Good  PLAN:  SLP FREQUENCY: 2x/week  SLP DURATION: 8 weeks  PLANNED INTERVENTIONS: Environmental controls, Functional tasks, Multimodal communication approach, SLP instruction and feedback, Compensatory strategies, and Patient/family education    [facility], CCC-SLP 06/29/2023, 3:42 PM

## 2023-07-03 ENCOUNTER — Ambulatory Visit: Payer: Medicare Other

## 2023-07-03 DIAGNOSIS — R41841 Cognitive communication deficit: Secondary | ICD-10-CM

## 2023-07-03 DIAGNOSIS — R471 Dysarthria and anarthria: Secondary | ICD-10-CM | POA: Diagnosis not present

## 2023-07-03 NOTE — Therapy (Signed)
OUTPATIENT SPEECH LANGUAGE PATHOLOGY [facility]   Patient Name: Joshua Browning MRN: [id number] DOB:06-Feb-1946, 77 y.o., male Today's Date: 07/03/2023  PCP: Daisy Floro, MD REFERRING PROVIDER: Kerin Salen, DO  END OF SESSION:  End of Session - 07/03/23 1333     Visit Number 8    Number of Visits 17    Date for SLP Re-Evaluation 08/11/23    SLP Start Time 1320    SLP Stop Time  1400    SLP Time Calculation (min) 40 min    Activity Tolerance Patient tolerated treatment well               Past Medical History:  Diagnosis Date   BPH (benign prostatic hyperplasia)    CVA (cerebral vascular accident) (HCC)    Foley catheter in place    removed after turp   GERD (gastroesophageal reflux disease)    Nocturnal leg cramps    occ   Parkinson's disease (HCC)    Urinary retention    Wears glasses    Past Surgical History:  Procedure Laterality Date   CATARACT EXTRACTION W/ INTRAOCULAR LENS  IMPLANT, BILATERAL Bilateral    INGUINAL HERNIA REPAIR Bilateral RIGHT x 2 1984 umbilical done with last right   left last one done 10 yrs ago   RE-DO RIGHT INGUINAL HERNIA REPAIR AND UMBILICAL HERNIA REPAIR  2001   REMOVAL LEFT EYE INTRAOCULAR LENS AND REPLACEMENT   08-29-2013   TRANSURETHRAL RESECTION OF PROSTATE N/A 09/23/2013   Procedure: TRANSURETHRAL RESECTION OF THE PROSTATE (TURP) WITH GYRUS;  Surgeon: Garnett Farm, MD;  Location: [facility];  Service: Urology;  Laterality: N/A;   VARICOCELECTOMY Left 07/02/2018   Procedure: VARICOCELECTOMY;  Surgeon: Ihor Gully, MD;  Location: [facility];  Service: Urology;  Laterality: Left;   Patient Active Problem List   Diagnosis Date Noted   H/O: stroke 10/27/2022   Cerebrovascular accident (CVA) (HCC) 09/28/2022   Exertional dyspnea 09/28/2022   Parkinson's disease without dyskinesia, with fluctuating manifestations (HCC) 09/09/2022   Benign prostatic hyperplasia with urinary  retention 09/23/2013    ONSET DATE: Script dated 05-25-23  REFERRING DIAG:  R47.1 (ICD-10-CM) - Dysarthria    THERAPY DIAG:  Dysarthria and anarthria  Cognitive communication deficit  Rationale for Evaluation and Treatment: Rehabilitation  SUBJECTIVE:   SUBJECTIVE STATEMENT: Pt arrives today with WNL volume voice Pt accompanied by: self  PERTINENT HISTORY:  PD, MCI, CLL, basal cell carcinoma, Ankle/feet paresthesias, CVA with field cut (09/2022)   PAIN:  Are you having pain? No  FALLS: Has patient fallen in last 6 months?  No   PATIENT GOALS: "I tried to work on it everyday (HEP)."  OBJECTIVE:  Note: Objective measures were completed at Evaluation unless otherwise noted.  DIAGNOSTIC FINDINGS:  Speech Therapy Parkinson's Disease Screen  May 2024   Decibel Level today: 66 dB  (WNL=70-72 dB) with sound level meter 30cm away from pt's mouth. Pt's conversational volume has decreased since last treatment course. Patient mentions that his communication is going well at home, with no overt communication challenges.  Pt does not report difficulty with swallowing, reporting improved swallow function since esophageal dilation. Ongoing challenges with street names and people's names as primary concern re: cognitive skills.  Pt would benefit from skilled ST to address dysarthria, however pt declines at this time. Recommend ST screen in another six months   PATIENT REPORTED OUTCOME MEASURES (PROM): Communication Effectiveness Survey: provided in first session (06/05/23) - pt scored  himself 23/32, with lower scores demonstrating greater impact of speech deficit on effectiveness of communication. Very effective was conversation in a quiet place and for conversing with a familiar person on the telephone. Not at all effective was communicating in a noisy environment.   TODAY'S TREATMENT:                                                                                                                                          DATE:  07/03/23: Pt has been practicing 10 minutes of louder speech practice each day as well. Joshua Browning's wife [name] did not ask him to repeat except once or twice since last session. In 21 minutes simple-mod complex conversation SLP cued pt x4 for louder speech - average 68dB. In 15 minutes conversation (mod complex) pt averaged 67dB - SLP cued pt x2 during this time for loudness. SLP provided memory strategies for pt. He said it was more for road names, restaurant names, etc and not necessarily memory for events. SLP provided memory strategies anyway.  06/29/23:Loud /a/ and everyday sentences have been completed 4-5 times since last session. Pt now has all sentences those that he may say everyday. SLP targeted loud /a/ to increase muscle memory for louder speech in conversation. Average was 87dB, with initial cue to improve loudness. Pt counted first 6 reps on fingers, which assisted his length of production. Sentences were produced with average 82 dB. In 15 minutes of conversation pt req'd occasional min A for increasing volume to WNL.   06/26/23: Since last session, pt completed loud /a/ three times. SLP targeted loud /a/ to improve pt's muscle memory for louder speech in conversation. Average was 87dB, with initial cue to extend production. Everyday sentences were brought today but pt will have to revamp 4 of them as they are not utterances he would say everyday. Today his average was 78dB with these sentences. In 9 minutes of simple conversation pt maintained loudness average of 68dB. SLP provided pt with homework for 15-20 minutes twice a day after loud /a/ BID and everyday sentences BID.  06/15/23: SLP targeted loud /a/ to increase muscle memory for louder speech in conversation. Average was 87dB, with initial cue to extend production. In 20 minutes of conversation pt averaged 68dB, with rare min A for incr'd abdominal recruitment and effort/loudness. SLP reiterated  pt's need to practice with louder voice at home.  06/12/23: Today SLP assessed pt's voice in conversation for 18 minutes; average 68 dB with occasional min A. Conversation for 14 minutes averaged 67dB with occasional min A for loudness. SLP stressed to pt to cont to practice at home.   06/08/23: Loud /a/ completed with average 86dB with cues to extend /a/ length initially. Conversation after loud /a/ started at average 69dB then after 30 seconds decr'd to average 65dB. SLP worked with pt reading sentences with WNL volume. He req'd digital recorder cues  to improve his loudness but was average 71 dB following this. In sentence completion tasks he was producing speech with average 68 dB with incr'd cognitive load necessary to complete sentences.   06/05/23: SLP provided CES today with pt scoring as above. SLP worked with pt again shAPing the loudness and mouth position (open mouth) with "ah". After 4 reps pt with "ah" with adequate volume and mouth posture at average 85 dB.  SLP instructed pt to read sentences using louder speech, and pt consistently not loud enough. When SLP utilized demo cues, pt attained 70dB, then SLP used digital recorder to incr pt's awareness of his softer than WNL volume and pt demonstrated he understood the difference by listening to recording (see "S" statement).  05/31/23: SLP shaped pt's loud /a/ over 7 minutes, and educated about the rationale for this activity for homework. SLP used digital recording to assist with this shaping, and the explanation for rationale of loud /a/. SLP educated pt about he will feel like he is talking TOO loudly and this is actually indication he is speaking at WNL volume.   PATIENT EDUCATION: Education details: see "today's treatment" Person educated: Patient Education method: Explanation, Demonstration, and Verbal cues Education comprehension: verbalized understanding, returned demonstration, verbal cues required, and needs further  education  HOME EXERCISE PROGRAM: 10 loud /a/, read 15 sentences, complete one worksheet with loud/WNL loud voice   GOALS: Goals reviewed with patient? Yes  SHORT TERM GOALS: Target date: 07/07/23  Pt will demo loud /a/ average mid 80s dB in 3 sessions Baseline:06/05/23, 06/08/23 Goal status: Met  2.  Pt will respond with sentences with average 70dB over 2 sessions Baseline: 06/15/23 Goal status: met  3.  Pt will produce 3 minutes simple conversation with average 70dB with nonverbal cues over three sessions  Baseline: 06/29/23, 07/03/23 Goal status: INITIAL   LONG TERM GOALS: Target date: 08/11/23  Pt will maintain loud /a/ average in mid 80s dB in 3 sessions after 07/07/23 Baseline:  Goal status: INITIAL  2.  Pt will produce 10 minutes simple-mod complex conversation with average 70dB over three sessions  Baseline: 06/29/23, 07/03/23 Goal status: INITIAL  3.  Pt will track appointment schedule and/or to-do lists via external cues during 3 sessions Baseline:  Goal status: INITIAL  4.  Pt will improve PROM score compared to initial administration Baseline:  Goal status: INITIAL  5.  Pt will successfully use memory strategy between 3 sesions Baseline:  Goal status: INITIAL   ASSESSMENT:  CLINICAL IMPRESSION: Patient is a 77 y.o. M who was seen today for treatment of speech clarity in light of PD. See "today's treatment" for details.  Pt is beginning to transfer skills to conversation. He states now his wife is no longer asking him to repeat at the same frequency as prior to ST. He also reports he is having difficulty with memory (more visual memory) compared to a year ago.   OBJECTIVE IMPAIRMENTS: Objective impairments include memory and dysarthria. These impairments are limiting patient from household responsibilities, ADLs/IADLs, and effectively communicating at home and in community.Factors affecting potential to achieve goals and functional outcome are  none  noted today .Marland Kitchen Patient will benefit from skilled SLP services to address above impairments and improve overall function.  REHAB POTENTIAL: Good  PLAN:  SLP FREQUENCY: 2x/week  SLP DURATION: 8 weeks  PLANNED INTERVENTIONS: Environmental controls, Functional tasks, Multimodal communication approach, SLP instruction and feedback, Compensatory strategies, and Patient/family education    [facility], CCC-SLP 07/03/2023, 1:34 PM

## 2023-07-03 NOTE — Patient Instructions (Signed)

## 2023-07-05 NOTE — Telephone Encounter (Signed)
Telephone call  

## 2023-07-06 ENCOUNTER — Ambulatory Visit: Payer: Medicare Other

## 2023-07-10 ENCOUNTER — Ambulatory Visit: Payer: Medicare Other

## 2023-07-10 DIAGNOSIS — R471 Dysarthria and anarthria: Secondary | ICD-10-CM

## 2023-07-10 DIAGNOSIS — R41841 Cognitive communication deficit: Secondary | ICD-10-CM

## 2023-07-10 NOTE — Therapy (Signed)
OUTPATIENT SPEECH LANGUAGE PATHOLOGY [facility]   Patient Name: Joshua Browning MRN: [id number] DOB:17-Dec-1945, 77 y.o., male Today's Date: 07/10/2023  PCP: Joshua Floro, MD REFERRING PROVIDER: Kerin Salen, DO  END OF SESSION:  End of Session - 07/10/23 1355     Visit Number 9    Number of Visits 17    Date for SLP Re-Evaluation 08/11/23    SLP Start Time 1325    SLP Stop Time  1404    SLP Time Calculation (min) 39 min    Activity Tolerance Patient tolerated treatment well               Past Medical History:  Diagnosis Date   BPH (benign prostatic hyperplasia)    CVA (cerebral vascular accident) (HCC)    Foley catheter in place    removed after turp   GERD (gastroesophageal reflux disease)    Nocturnal leg cramps    occ   Parkinson's disease (HCC)    Urinary retention    Wears glasses    Past Surgical History:  Procedure Laterality Date   CATARACT EXTRACTION W/ INTRAOCULAR LENS  IMPLANT, BILATERAL Bilateral    INGUINAL HERNIA REPAIR Bilateral RIGHT x 2 1984 umbilical done with last right   left last one done 10 yrs ago   RE-DO RIGHT INGUINAL HERNIA REPAIR AND UMBILICAL HERNIA REPAIR  2001   REMOVAL LEFT EYE INTRAOCULAR LENS AND REPLACEMENT   08-29-2013   TRANSURETHRAL RESECTION OF PROSTATE N/A 09/23/2013   Procedure: TRANSURETHRAL RESECTION OF THE PROSTATE (TURP) WITH GYRUS;  Surgeon: Joshua Farm, MD;  Location: [facility];  Service: Urology;  Laterality: N/A;   VARICOCELECTOMY Left 07/02/2018   Procedure: VARICOCELECTOMY;  Surgeon: Joshua Gully, MD;  Location: [facility];  Service: Urology;  Laterality: Left;   Patient Active Problem List   Diagnosis Date Noted   H/O: stroke 10/27/2022   Cerebrovascular accident (CVA) (HCC) 09/28/2022   Exertional dyspnea 09/28/2022   Parkinson'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 Pt accompanied by: self  PERTINENT HISTORY:  PD, MCI, CLL, basal cell carcinoma, Ankle/feet paresthesias, CVA with field cut (09/2022)   PAIN:  Are you having pain? No  FALLS: Has patient fallen in last 6 months?  No   PATIENT GOALS: "I tried to work on it everyday (HEP)."  OBJECTIVE:  Note: Objective measures were completed at Evaluation unless otherwise noted.  DIAGNOSTIC FINDINGS:  Speech Therapy Parkinson's Disease Screen  May 2024   Decibel Level today: 66 dB  (WNL=70-72 dB) with sound level meter 30cm away from pt's mouth. Pt's conversational volume has decreased since last treatment course. Patient mentions that his communication is going well at home, with no overt communication challenges.  Pt does not report difficulty with swallowing, reporting improved swallow function since esophageal dilation. Ongoing challenges with street names and people's names as primary concern re: cognitive skills.  Pt would benefit from skilled ST to address dysarthria, however pt declines at this time. Recommend ST screen in another six months   PATIENT REPORTED OUTCOME MEASURES (PROM): Communication Effectiveness Survey: provided in first session (06/05/23) - pt scored  himself 23/32, with lower scores demonstrating greater impact of speech deficit on effectiveness of communication. Very effective was conversation in a quiet place and for conversing with a familiar person on the telephone. Not at all effective was communicating in a noisy environment.   TODAY'S TREATMENT:                                                                                                                                          DATE:  07/10/23: Pt enters speaking with average 68dB, stating wife has not asked him to repeat  SLP lead pt in conversation for 25 minutes - one cue for increasing loudness and pt averaged 68dB with door open for ST room. SLP suggested pt decr to once/week for progress. Pt agreed with this.  "I'm using that calendar all the time" pt stated.   07/03/23: Pt has been practicing 10 minutes of louder speech practice each day as well. Joshua Browning's wife Joshua Browning did not ask him to repeat except once or twice since last session. In 21 minutes simple-mod complex conversation SLP cued pt x4 for louder speech - average 68dB. In 15 minutes conversation (mod complex) pt averaged 67dB - SLP cued pt x2 during this time for loudness. SLP provided memory strategies for pt. He said it was more for road names, restaurant names, etc and not necessarily memory for events. SLP provided memory strategies anyway.  06/29/23:Loud /a/ and everyday sentences have been completed 4-5 times since last session. Pt now has all sentences those that he may say everyday. SLP targeted loud /a/ to increase muscle memory for louder speech in conversation. Average was 87dB, with initial cue to improve loudness. Pt counted first 6 reps on fingers, which assisted his length of production. Sentences were produced with average 82 dB. In 15 minutes of conversation pt req'd occasional min A for increasing volume to WNL.   06/26/23: Since last session, pt completed loud /a/ three times. SLP targeted loud /a/ to improve pt's muscle memory for louder speech in conversation. Average was 87dB, with initial cue to extend production. Everyday sentences were brought today but pt will have to revamp 4 of them as they are not utterances he would say everyday. Today his average was 78dB with these sentences. In 9 minutes of simple conversation pt maintained loudness average of 68dB. SLP provided pt with homework for 15-20 minutes  twice a day after loud /a/ BID and everyday sentences BID.  06/15/23: SLP targeted loud /a/ to increase muscle memory for louder speech in conversation. Average was 87dB, with initial cue to extend production. In 20 minutes of conversation pt averaged 68dB, with rare min A for incr'd abdominal recruitment and effort/loudness. SLP reiterated pt's need to practice with louder voice at home.  06/12/23: Today SLP assessed pt's voice in conversation for 18 minutes; average 68 dB with occasional min A. Conversation for 14 minutes  averaged 67dB with occasional min A for loudness. SLP stressed to pt to cont to practice at home.   06/08/23: Loud /a/ completed with average 86dB with cues to extend /a/ length initially. Conversation after loud /a/ started at average 69dB then after 30 seconds decr'd to average 65dB. SLP worked with pt reading sentences with WNL volume. He req'd digital recorder cues to improve his loudness but was average 71 dB following this. In sentence completion tasks he was producing speech with average 68 dB with incr'd cognitive load necessary to complete sentences.   06/05/23: SLP provided CES today with pt scoring as above. SLP worked with pt again shAPing the loudness and mouth position (open mouth) with "ah". After 4 reps pt with "ah" with adequate volume and mouth posture at average 85 dB.  SLP instructed pt to read sentences using louder speech, and pt consistently not loud enough. When SLP utilized demo cues, pt attained 70dB, then SLP used digital recorder to incr pt's awareness of his softer than WNL volume and pt demonstrated he understood the difference by listening to recording (see "S" statement).  05/31/23: SLP shaped pt's loud /a/ over 7 minutes, and educated about the rationale for this activity for homework. SLP used digital recording to assist with this shaping, and the explanation for rationale of loud /a/. SLP educated pt about he will feel like he is talking TOO loudly  and this is actually indication he is speaking at WNL volume.   PATIENT EDUCATION: Education details: see "today's treatment" Person educated: Patient Education method: Explanation, Demonstration, and Verbal cues Education comprehension: verbalized understanding, returned demonstration, verbal cues required, and needs further education  HOME EXERCISE PROGRAM: 10 loud /a/, read 15 sentences, complete one worksheet with loud/WNL loud voice   GOALS: Goals reviewed with patient? Yes  SHORT TERM GOALS: Target date: 07/07/23  Pt will demo loud /a/ average mid 80s dB in 3 sessions Baseline:06/05/23, 06/08/23 Goal status: Met  2.  Pt will respond with sentences with average 70dB over 2 sessions Baseline: 06/15/23 Goal status: met  3.  Pt will produce 3 minutes simple conversation with average 70dB with nonverbal cues over three sessions  Baseline: 06/29/23, 07/03/23 Goal status: Met   LONG TERM GOALS: Target date: 08/11/23  Pt will maintain loud /a/ average in mid 80s dB in 3 sessions after 07/07/23 Baseline:  Goal status: INITIAL  2.  Pt will produce 10 minutes simple-mod complex conversation with average 70dB over three sessions  Baseline: 06/29/23, 07/03/23 Goal status: INITIAL  3.  Pt will track appointment schedule and/or to-do lists via external cues during 3 sessions Baseline:  Goal status: INITIAL  4.  Pt will improve PROM score compared to initial administration Baseline:  Goal status: INITIAL  5.  Pt will successfully use memory strategy between 3 sesions Baseline:  Goal status: DEFERRED    ASSESSMENT:  CLINICAL IMPRESSION: Patient is a 77 y.o. M who was seen today for treatment of speech clarity in light of PD. See "today's treatment" for details.  Pt is beginning to transfer skills to conversation. He states now his wife is no longer asking him to repeat at the same frequency as prior to ST. He also reports he is having difficulty with memory (more visual  memory) compared to a year ago.   OBJECTIVE IMPAIRMENTS: Objective impairments include memory and dysarthria. These impairments are limiting patient from household responsibilities, ADLs/IADLs, and effectively communicating at home and in community.Factors affecting potential to achieve goals and  functional outcome are  none noted today .Marland Kitchen Patient will benefit from skilled SLP services to address above impairments and improve overall function.  REHAB POTENTIAL: Good  PLAN:  SLP FREQUENCY: 1x/week  SLP DURATION: 8 weeks  PLANNED INTERVENTIONS: Environmental controls, Functional tasks, Multimodal communication approach, SLP instruction and feedback, Compensatory strategies, and Patient/family education    [facility], CCC-SLP 07/10/2023, 11:15 PM

## 2023-07-12 ENCOUNTER — Ambulatory Visit: Payer: Medicare Other

## 2023-07-17 ENCOUNTER — Ambulatory Visit: Payer: Medicare Other | Attending: Neurology

## 2023-07-17 DIAGNOSIS — R471 Dysarthria and anarthria: Secondary | ICD-10-CM | POA: Diagnosis not present

## 2023-07-17 DIAGNOSIS — R41841 Cognitive communication deficit: Secondary | ICD-10-CM | POA: Insufficient documentation

## 2023-07-17 NOTE — Therapy (Signed)
OUTPATIENT SPEECH LANGUAGE PATHOLOGY PARKINSON'S TREATMENT   Patient Name: Joshua Browning MRN: [id number] DOB:[birth year], 77 y.o., male Today's Date: 07/17/2023  PCP: Daisy Floro, MD REFERRING PROVIDER: Kerin Salen, DO  END OF SESSION:  End of Session - 07/17/23 1331     Visit Number 10    Number of Visits 17    Date for SLP Re-Evaluation 08/11/23    SLP Start Time 1322    SLP Stop Time  1400    SLP Time Calculation (min) 38 min    Activity Tolerance Patient tolerated treatment well               Past Medical History:  Diagnosis Date   BPH (benign prostatic hyperplasia)    CVA (cerebral vascular accident) (HCC)    Foley catheter in place    removed after turp   GERD (gastroesophageal reflux disease)    Nocturnal leg cramps    occ   Parkinson's disease (HCC)    Urinary retention    Wears glasses    Past Surgical History:  Procedure Laterality Date   CATARACT EXTRACTION W/ INTRAOCULAR LENS  IMPLANT, BILATERAL Bilateral    INGUINAL HERNIA REPAIR Bilateral RIGHT x 2 1984 umbilical done with last right   left last one done 10 yrs ago   RE-DO RIGHT INGUINAL HERNIA REPAIR AND UMBILICAL HERNIA REPAIR  2001   REMOVAL LEFT EYE INTRAOCULAR LENS AND REPLACEMENT   08-29-2013   TRANSURETHRAL RESECTION OF PROSTATE N/A 09/23/2013   Procedure: TRANSURETHRAL RESECTION OF THE PROSTATE (TURP) WITH GYRUS;  Surgeon: Garnett Farm, MD;  Location: [facility] [place];  Service: Urology;  Laterality: N/A;   VARICOCELECTOMY Left 07/02/2018   Procedure: VARICOCELECTOMY;  Surgeon: Ihor Gully, MD;  Location: [facility];  Service: Urology;  Laterality: Left;   Patient Active Problem List   Diagnosis Date Noted   H/O: stroke 10/27/2022   Cerebrovascular accident (CVA) (HCC) 09/28/2022   Exertional dyspnea 09/28/2022   Parkinson's disease without dyskinesia, with fluctuating manifestations (HCC) 09/09/2022   Benign prostatic hyperplasia with urinary  retention 09/23/2013    ONSET DATE: Script dated 05-25-23  REFERRING DIAG:  R47.1 (ICD-10-CM) - Dysarthria    THERAPY DIAG:  Dysarthria and anarthria  Cognitive communication deficit  Rationale for Evaluation and Treatment: Rehabilitation  SUBJECTIVE:   SUBJECTIVE STATEMENT: Pt reports when having meals nobody asked him to repeat- only few times asked to repeat was when kids were playing loudly nearby.  Pt accompanied by: self  PERTINENT HISTORY:  PD, MCI, CLL, basal cell carcinoma, Ankle/feet paresthesias, CVA with field cut (09/2022)   PAIN:  Are you having pain? No  FALLS: Has patient fallen in last 6 months?  No   PATIENT GOALS: "I tried to work on it everyday (HEP)."  OBJECTIVE:  Note: Objective measures were completed at Evaluation unless otherwise noted.  DIAGNOSTIC FINDINGS:  Speech Therapy Parkinson's Disease Screen  May 2024   Decibel Level today: 66 dB  (WNL=70-72 dB) with sound level meter 30cm away from pt's mouth. Pt's conversational volume has decreased since last treatment course. Patient mentions that his communication is going well at home, with no overt communication challenges.  Pt does not report difficulty with swallowing, reporting improved swallow function since esophageal dilation. Ongoing challenges with street names and people's names as primary concern re: cognitive skills.  Pt would benefit from skilled ST to address dysarthria, however pt declines at this time. Recommend ST screen in another six months  PATIENT REPORTED OUTCOME MEASURES (PROM): Communication Effectiveness Survey: provided in first session (06/05/23) - pt scored  himself 23/32, with lower scores demonstrating greater impact of speech deficit on effectiveness of communication. Very effective was conversation in a quiet place and for conversing with a familiar person on the telephone. Not at all effective was communicating in a noisy environment.   TODAY'S TREATMENT:                                                                                                                                          DATE:  07/17/23: Pt filled out CES today and answered with scores 27/32 which is improved from 23/32. Average speaking volume 69dB for 20 minutes in conversation. SLP and pt discussed pt's ability to compensate for memory with compensations that are working successfully. Pt and SLP agreed he could be seen next Monday 07/24/23.   07/10/23: Pt enters speaking with average 68dB, stating wife has not asked him to repeat  SLP lead pt in conversation for 25 minutes - one cue for increasing loudness and pt averaged 68dB with door open for ST room. SLP suggested pt decr to once/week for progress. Pt agreed with this.  "I'm using that calendar all the time" pt stated.   07/03/23: Pt has been practicing 10 minutes of louder speech practice each day as well. Ashleigh's wife [name] did not ask him to repeat except once or twice since last session. In 21 minutes simple-mod complex conversation SLP cued pt x4 for louder speech - average 68dB. In 15 minutes conversation (mod complex) pt averaged 67dB - SLP cued pt x2 during this time for loudness. SLP provided memory strategies for pt. He said it was more for road names, restaurant names, etc and not necessarily memory for events. SLP provided memory strategies anyway.  06/29/23:Loud /a/ and everyday sentences have been completed 4-5 times since last session. Pt now has all sentences those that he may say everyday. SLP targeted loud /a/ to increase muscle memory for louder speech in conversation. Average was 87dB, with initial cue to improve loudness. Pt counted first 6 reps on fingers, which assisted his length of production. Sentences were produced with average 82 dB. In 15 minutes of conversation pt req'd occasional min A for increasing volume to WNL.   06/26/23: Since last session, pt completed loud /a/ three times. SLP targeted loud /a/ to  improve pt's muscle memory for louder speech in conversation. Average was 87dB, with initial cue to extend production. Everyday sentences were brought today but pt will have to revamp 4 of them as they are not utterances he would say everyday. Today his average was 78dB with these sentences. In 9 minutes of simple conversation pt maintained loudness average of 68dB. SLP provided pt with homework for 15-20 minutes twice a day after loud /a/ BID and everyday sentences BID.  06/15/23: SLP targeted loud /  a/ to increase muscle memory for louder speech in conversation. Average was 87dB, with initial cue to extend production. In 20 minutes of conversation pt averaged 68dB, with rare min A for incr'd abdominal recruitment and effort/loudness. SLP reiterated pt's need to practice with louder voice at home.  06/12/23: Today SLP assessed pt's voice in conversation for 18 minutes; average 68 dB with occasional min A. Conversation for 14 minutes averaged 67dB with occasional min A for loudness. SLP stressed to pt to cont to practice at home.   06/08/23: Loud /a/ completed with average 86dB with cues to extend /a/ length initially. Conversation after loud /a/ started at average 69dB then after 30 seconds decr'd to average 65dB. SLP worked with pt reading sentences with WNL volume. He req'd digital recorder cues to improve his loudness but was average 71 dB following this. In sentence completion tasks he was producing speech with average 68 dB with incr'd cognitive load necessary to complete sentences.   06/05/23: SLP provided CES today with pt scoring as above. SLP worked with pt again shAPing the loudness and mouth position (open mouth) with "ah". After 4 reps pt with "ah" with adequate volume and mouth posture at average 85 dB.  SLP instructed pt to read sentences using louder speech, and pt consistently not loud enough. When SLP utilized demo cues, pt attained 70dB, then SLP used digital recorder to incr pt's  awareness of his softer than WNL volume and pt demonstrated he understood the difference by listening to recording (see "S" statement).  05/31/23: SLP shaped pt's loud /a/ over 7 minutes, and educated about the rationale for this activity for homework. SLP used digital recording to assist with this shaping, and the explanation for rationale of loud /a/. SLP educated pt about he will feel like he is talking TOO loudly and this is actually indication he is speaking at WNL volume.   PATIENT EDUCATION: Education details: see "today's treatment" Person educated: Patient Education method: Explanation, Demonstration, and Verbal cues Education comprehension: verbalized understanding, returned demonstration, verbal cues required, and needs further education  HOME EXERCISE PROGRAM: 10 loud /a/, read 15 sentences, complete one worksheet with loud/WNL loud voice   GOALS: Goals reviewed with patient? Yes  SHORT TERM GOALS: Target date: 07/07/23  Pt will demo loud /a/ average mid 80s dB in 3 sessions Baseline:06/05/23, 06/08/23 Goal status: Met  2.  Pt will respond with sentences with average 70dB over 2 sessions Baseline: 06/15/23 Goal status: met  3.  Pt will produce 3 minutes simple conversation with average 70dB with nonverbal cues over three sessions  Baseline: 06/29/23, 07/03/23 Goal status: Met   LONG TERM GOALS: Target date: 08/11/23  Pt will maintain loud /a/ average in mid 80s dB in 3 sessions after 07/07/23 Baseline:  Goal status: INITIAL  2.  Pt will produce 10 minutes simple-mod complex conversation with average 70dB over three sessions  Baseline: 06/29/23, 07/03/23 Goal status: INITIAL  3.  Pt will track appointment schedule and/or to-do lists via external cues during 3 sessions Baseline: 07/10/23, 07/17/23 Goal status: INITIAL  4.  Pt will improve PROM score compared to initial administration Baseline:  Goal status: Met  5.  Pt will successfully use memory strategy  between 3 sesions Baseline:  Goal status: DEFERRED    ASSESSMENT:  CLINICAL IMPRESSION: Patient is a 77 y.o. M who was seen today for treatment of speech clarity in light of PD. See "today's treatment" for details.  Pt is having more success with  transferring skills to conversation; no family asked him to repeat over the holiday. He states now his wife is no longer asking him to repeat at the same frequency as prior to ST. He also reports he is having difficulty with memory (more visual memory) compared to a year ago.   OBJECTIVE IMPAIRMENTS: Objective impairments include memory and dysarthria. These impairments are limiting patient from household responsibilities, ADLs/IADLs, and effectively communicating at home and in community.Factors affecting potential to achieve goals and functional outcome are  none noted today .Marland Kitchen Patient will benefit from skilled SLP services to address above impairments and improve overall function.  REHAB POTENTIAL: Good  PLAN:  SLP FREQUENCY: 1x/week  SLP DURATION: 8 weeks  PLANNED INTERVENTIONS: Environmental controls, Functional tasks, Multimodal communication approach, SLP instruction and feedback, Compensatory strategies, and Patient/family education    [facility], CCC-SLP 07/17/2023, 1:32 PM

## 2023-07-24 ENCOUNTER — Ambulatory Visit: Payer: Medicare Other

## 2023-07-24 DIAGNOSIS — R41841 Cognitive communication deficit: Secondary | ICD-10-CM

## 2023-07-24 DIAGNOSIS — R471 Dysarthria and anarthria: Secondary | ICD-10-CM | POA: Diagnosis not present

## 2023-07-24 NOTE — Therapy (Signed)
OUTPATIENT SPEECH LANGUAGE PATHOLOGY PARKINSON'S TREATMENT/RECERTIFICATION   Patient Name: Joshua Browning MRN: [id number] DOB:[birth year], 77 y.o., male Today's Date: 07/24/2023  PCP: Daisy Floro, MD REFERRING PROVIDER: Kerin Salen, DO  END OF SESSION:  End of Session - 07/24/23 1345     Visit Number 11    Number of Visits 17    Date for SLP Re-Evaluation 08/11/23    SLP Start Time 1320    SLP Stop Time  1400    SLP Time Calculation (min) 40 min    Activity Tolerance Patient tolerated treatment well                Past Medical History:  Diagnosis Date   BPH (benign prostatic hyperplasia)    CVA (cerebral vascular accident) (HCC)    Foley catheter in place    removed after turp   GERD (gastroesophageal reflux disease)    Nocturnal leg cramps    occ   Parkinson's disease (HCC)    Urinary retention    Wears glasses    Past Surgical History:  Procedure Laterality Date   CATARACT EXTRACTION W/ INTRAOCULAR LENS  IMPLANT, BILATERAL Bilateral    INGUINAL HERNIA REPAIR Bilateral RIGHT x 2 1984 umbilical done with last right   left last one done 10 yrs ago   RE-DO RIGHT INGUINAL HERNIA REPAIR AND UMBILICAL HERNIA REPAIR  2001   REMOVAL LEFT EYE INTRAOCULAR LENS AND REPLACEMENT   08-29-2013   TRANSURETHRAL RESECTION OF PROSTATE N/A 09/23/2013   Procedure: TRANSURETHRAL RESECTION OF THE PROSTATE (TURP) WITH GYRUS;  Surgeon: Garnett Farm, MD;  Location: [facility];  Service: Urology;  Laterality: N/A;   VARICOCELECTOMY Left 07/02/2018   Procedure: VARICOCELECTOMY;  Surgeon: Ihor Gully, MD;  Location: [facility];  Service: Urology;  Laterality: Left;   Patient Active Problem List   Diagnosis Date Noted   H/O: stroke 10/27/2022   Cerebrovascular accident (CVA) (HCC) 09/28/2022   Exertional dyspnea 09/28/2022   Parkinson's disease without dyskinesia, with fluctuating manifestations (HCC) 09/09/2022   Benign prostatic hyperplasia  with urinary retention 09/23/2013    ONSET DATE: Script dated 05-25-23  REFERRING DIAG:  R47.1 (ICD-10-CM) - Dysarthria   Speech Therapy Progress Note  Dates of Reporting Period: 05-31-23 to present  Subjective Statement: Pt has been seen for 10 therapy sessions (11 overall) focusing on speech  loudness and memory strategies.  Objective: see below  Goal Update: see below  Plan: Cont to see pt one more session  Reason Skilled Services are Required: Pt would like to verify progress over time as well as wrap up with a refresher session next Monday.   THERAPY DIAG:  Dysarthria and anarthria  Cognitive communication deficit  Rationale for Evaluation and Treatment: Rehabilitation  SUBJECTIVE:   SUBJECTIVE STATEMENT: Pt indicates wife has not been asked to repeat at family gathering, or church gathering.  Pt accompanied by: self  PERTINENT HISTORY:  PD, MCI, CLL, basal cell carcinoma, Ankle/feet paresthesias, CVA with field cut (09/2022)   PAIN:  Are you having pain? No  FALLS: Has patient fallen in last 6 months?  No   PATIENT GOALS: "I tried to work on it everyday (HEP)."  OBJECTIVE:  Note: Objective measures were completed at Evaluation unless otherwise noted.  DIAGNOSTIC FINDINGS:  Speech Therapy Parkinson's Disease Screen  May 2024   Decibel Level today: 66 dB  (WNL=70-72 dB) with sound level meter 30cm away from pt's mouth. Pt's conversational volume has decreased since last  treatment course. Patient mentions that his communication is going well at home, with no overt communication challenges.  Pt does not report difficulty with swallowing, reporting improved swallow function since esophageal dilation. Ongoing challenges with street names and people's names as primary concern re: cognitive skills.  Pt would benefit from skilled ST to address dysarthria, however pt declines at this time. Recommend ST screen in another six months   PATIENT REPORTED OUTCOME  MEASURES (PROM): Communication Effectiveness Survey: provided in first session (06/05/23) - pt scored  himself 23/32, with lower scores demonstrating greater impact of speech deficit on effectiveness of communication. Very effective was conversation in a quiet place and for conversing with a familiar person on the telephone. Not at all effective was communicating in a noisy environment.   TODAY'S TREATMENT:                                                                                                                                         DATE:  07/24/23: Joshua Browning has been completing /a/ with consistency Pt with conversation of 15 minutes with average 69dB, During the next 13 minutes his loudness average decreased to average 67dB. He stated he had a conversation in the car with 3 others driving to [place] last week and nobody asked him to repeat. Outdoors, pt remained intelligible 100% of the time for 9 minutes, with car/parking lot noise, playground noise in the distance, as well as airport traffic overhead. Pt thought one more session would give him opportunity to verify progress over time and to "wrap up" the exercises in session with SLP.  07/17/23: Pt filled out CES today and answered with scores 27/32 which is improved from 23/32. Average speaking volume 69dB for 20 minutes in conversation. SLP and pt discussed pt's ability to compensate for memory with compensations that are working successfully. Pt and SLP agreed he could be seen next Monday 07/24/23.   07/10/23: Pt enters speaking with average 68dB, stating wife has not asked him to repeat  SLP lead pt in conversation for 25 minutes - one cue for increasing loudness and pt averaged 68dB with door open for ST room. SLP suggested pt decr to once/week for progress. Pt agreed with this.  "I'm using that calendar all the time" pt stated.   07/03/23: Pt has been practicing 10 minutes of louder speech practice each day as well. Joshua Browning's wife [name]  did not ask him to repeat except once or twice since last session. In 21 minutes simple-mod complex conversation SLP cued pt x4 for louder speech - average 68dB. In 15 minutes conversation (mod complex) pt averaged 67dB - SLP cued pt x2 during this time for loudness. SLP provided memory strategies for pt. He said it was more for road names, restaurant names, etc and not necessarily memory for events. SLP provided memory strategies anyway.  06/29/23:Loud /a/ and everyday sentences have been completed 4-5  times since last session. Pt now has all sentences those that he may say everyday. SLP targeted loud /a/ to increase muscle memory for louder speech in conversation. Average was 87dB, with initial cue to improve loudness. Pt counted first 6 reps on fingers, which assisted his length of production. Sentences were produced with average 82 dB. In 15 minutes of conversation pt req'd occasional min A for increasing volume to WNL.   06/26/23: Since last session, pt completed loud /a/ three times. SLP targeted loud /a/ to improve pt's muscle memory for louder speech in conversation. Average was 87dB, with initial cue to extend production. Everyday sentences were brought today but pt will have to revamp 4 of them as they are not utterances he would say everyday. Today his average was 78dB with these sentences. In 9 minutes of simple conversation pt maintained loudness average of 68dB. SLP provided pt with homework for 15-20 minutes twice a day after loud /a/ BID and everyday sentences BID.  06/15/23: SLP targeted loud /a/ to increase muscle memory for louder speech in conversation. Average was 87dB, with initial cue to extend production. In 20 minutes of conversation pt averaged 68dB, with rare min A for incr'd abdominal recruitment and effort/loudness. SLP reiterated pt's need to practice with louder voice at home.  06/12/23: Today SLP assessed pt's voice in conversation for 18 minutes; average 68 dB with occasional  min A. Conversation for 14 minutes averaged 67dB with occasional min A for loudness. SLP stressed to pt to cont to practice at home.   06/08/23: Loud /a/ completed with average 86dB with cues to extend /a/ length initially. Conversation after loud /a/ started at average 69dB then after 30 seconds decr'd to average 65dB. SLP worked with pt reading sentences with WNL volume. He req'd digital recorder cues to improve his loudness but was average 71 dB following this. In sentence completion tasks he was producing speech with average 68 dB with incr'd cognitive load necessary to complete sentences.   06/05/23: SLP provided CES today with pt scoring as above. SLP worked with pt again shAPing the loudness and mouth position (open mouth) with "ah". After 4 reps pt with "ah" with adequate volume and mouth posture at average 85 dB.  SLP instructed pt to read sentences using louder speech, and pt consistently not loud enough. When SLP utilized demo cues, pt attained 70dB, then SLP used digital recorder to incr pt's awareness of his softer than WNL volume and pt demonstrated he understood the difference by listening to recording (see "S" statement).  05/31/23: SLP shaped pt's loud /a/ over 7 minutes, and educated about the rationale for this activity for homework. SLP used digital recording to assist with this shaping, and the explanation for rationale of loud /a/. SLP educated pt about he will feel like he is talking TOO loudly and this is actually indication he is speaking at WNL volume.   PATIENT EDUCATION: Education details: see "today's treatment" Person educated: Patient Education method: Explanation, Demonstration, and Verbal cues Education comprehension: verbalized understanding, returned demonstration, verbal cues required, and needs further education  HOME EXERCISE PROGRAM: 10 loud /a/, read 15 sentences, complete one worksheet with loud/WNL loud voice   GOALS: Goals reviewed with patient?  Yes  SHORT TERM GOALS: Target date: 07/07/23  Pt will demo loud /a/ average mid 80s dB in 3 sessions Baseline:06/05/23, 06/08/23 Goal status: Met  2.  Pt will respond with sentences with average 70dB over 2 sessions Baseline: 06/15/23 Goal status: met  3.  Pt will produce 3 minutes simple conversation with average 70dB with nonverbal cues over three sessions  Baseline: 06/29/23, 07/03/23 Goal status: Met   LONG TERM GOALS: Target date: 08/11/23  Pt will maintain loud /a/ average in mid 80s dB in 3 sessions after 07/07/23 Baseline:  Goal status: INITIAL  2.  Pt will produce 10 minutes simple-mod complex conversation with average 70dB over three sessions  Baseline: 06/29/23, 07/03/23 Goal status: INITIAL  3.  Pt will track appointment schedule and/or to-do lists via external cues during 3 sessions Baseline: 07/10/23, 07/17/23 Goal status: Met  4.  Pt will improve PROM score compared to initial administration Baseline:  Goal status: Met  5.  Pt will successfully use memory strategy between 3 sesions Baseline:  Goal status: DEFERRED    ASSESSMENT:  CLINICAL IMPRESSION: Patient is a 77 y.o. M who was seen today for treatment of speech clarity in light of PD. See "today's treatment" for details.  Pt is having more success with transferring skills to conversation; no family asked him to repeat over the holiday. He states now his wife is no longer asking him to repeat at the same frequency as prior to ST. He also reports he is having difficulty with memory (more visual memory) compared to a year ago.   OBJECTIVE IMPAIRMENTS: Objective impairments include memory and dysarthria. These impairments are limiting patient from household responsibilities, ADLs/IADLs, and effectively communicating at home and in community.Factors affecting potential to achieve goals and functional outcome are  none noted today .Marland Kitchen Patient will benefit from skilled SLP services to address above impairments  and improve overall function.  REHAB POTENTIAL: Good  PLAN:  SLP FREQUENCY: 1x/week  SLP DURATION: 8 weeks  PLANNED INTERVENTIONS: Environmental controls, Functional tasks, Multimodal communication approach, SLP instruction and feedback, Compensatory strategies, and Patient/family education    [facility], CCC-SLP 07/24/2023, 1:45 PM

## 2023-07-31 ENCOUNTER — Ambulatory Visit: Payer: Medicare Other

## 2023-07-31 DIAGNOSIS — R41841 Cognitive communication deficit: Secondary | ICD-10-CM

## 2023-07-31 DIAGNOSIS — R471 Dysarthria and anarthria: Secondary | ICD-10-CM | POA: Diagnosis not present

## 2023-07-31 NOTE — Therapy (Signed)
OUTPATIENT SPEECH LANGUAGE PATHOLOGY PARKINSON'S TREATMENT/DISCHARGE   Patient Name: Joshua Browning MRN: [id number] DOB:May 08, 1946, 77 y.o., male Today's Date: 07/31/2023  PCP: Joshua Floro, MD REFERRING PROVIDER: Kerin Salen, DO  END OF SESSION:  End of Session - 07/31/23 1332     Visit Number 12    Number of Visits 17    Date for SLP Re-Evaluation 08/11/23    SLP Start Time 1320    SLP Stop Time  1400    SLP Time Calculation (min) 40 min    Activity Tolerance Patient tolerated treatment well                Past Medical History:  Diagnosis Date   BPH (benign prostatic hyperplasia)    CVA (cerebral vascular accident) (HCC)    Foley catheter in place    removed after turp   GERD (gastroesophageal reflux disease)    Nocturnal leg cramps    occ   Parkinson's disease (HCC)    Urinary retention    Wears glasses    Past Surgical History:  Procedure Laterality Date   CATARACT EXTRACTION W/ INTRAOCULAR LENS  IMPLANT, BILATERAL Bilateral    INGUINAL HERNIA REPAIR Bilateral RIGHT x 2 1984 umbilical done with last right   left last one done 10 yrs ago   RE-DO RIGHT INGUINAL HERNIA REPAIR AND UMBILICAL HERNIA REPAIR  2001   REMOVAL LEFT EYE INTRAOCULAR LENS AND REPLACEMENT   08-29-2013   TRANSURETHRAL RESECTION OF PROSTATE N/A 09/23/2013   Procedure: TRANSURETHRAL RESECTION OF THE PROSTATE (TURP) WITH GYRUS;  Surgeon: Joshua Farm, MD;  Location: [facility];  Service: Urology;  Laterality: N/A;   VARICOCELECTOMY Left 07/02/2018   Procedure: VARICOCELECTOMY;  Surgeon: Joshua Gully, MD;  Location: [facility];  Service: Urology;  Laterality: Left;   Patient Active Problem List   Diagnosis Date Noted   H/O: stroke 10/27/2022   Cerebrovascular accident (CVA) (HCC) 09/28/2022   Exertional dyspnea 09/28/2022   Parkinson's disease without dyskinesia, with fluctuating manifestations (HCC) 09/09/2022   Benign prostatic hyperplasia with  urinary retention 09/23/2013    ONSET DATE: Script dated 05-25-23  REFERRING DIAG:  R47.1 (ICD-10-CM) - Dysarthria   SPEECH THERAPY DISCHARGE SUMMARY  Visits from Start of Care: 12  Current functional level related to goals / functional outcomes: Pt reports he is better heard now than prior to ST. See below.   Remaining deficits: Mild dysarthria resulting in decr'd voice volume, when especially fatigued or with excessive background noise.   Education / Equipment: See below.   Patient agrees to discharge. Patient goals were partially met. Patient is being discharged due to being pleased with the current functional level..      THERAPY DIAG:  Dysarthria and anarthria  Cognitive communication deficit  Rationale for Evaluation and Treatment: Rehabilitation  SUBJECTIVE:   SUBJECTIVE STATEMENT: Pt indicated that he had dinner last night and didn't have anyone ask him for repeats even across the length of the table.  Pt accompanied by: self  PERTINENT HISTORY:  PD, MCI, CLL, basal cell carcinoma, Ankle/feet paresthesias, CVA with field cut (09/2022)   PAIN:  Are you having pain? No  FALLS: Has patient fallen in last 6 months?  No   PATIENT GOALS: "I tried to work on it everyday (HEP)."  OBJECTIVE:  Note: Objective measures were completed at Evaluation unless otherwise noted.  DIAGNOSTIC FINDINGS:  Speech Therapy Parkinson's Disease Screen  May 2024   Decibel Level today: 66 dB  (  WNL=70-72 dB) with sound level meter 30cm away from pt's mouth. Pt's conversational volume has decreased since last treatment course. Patient mentions that his communication is going well at home, with no overt communication challenges.  Pt does not report difficulty with swallowing, reporting improved swallow function since esophageal dilation. Ongoing challenges with street names and people's names as primary concern re: cognitive skills.  Pt would benefit from skilled ST to address  dysarthria, however pt declines at this time. Recommend ST screen in another six months   PATIENT REPORTED OUTCOME MEASURES (PROM): Communication Effectiveness Survey: provided in first session (06/05/23) - pt scored  himself 23/32, with lower scores demonstrating greater impact of speech deficit on effectiveness of communication. Very effective was conversation in a quiet place and for conversing with a familiar person on the telephone. Not at all effective was communicating in a noisy environment.   TODAY'S TREATMENT:                                                                                                                                         DATE:  07/31/23: Loud /a/ targeted today to make louder speech a habit - pt with average 85dB loudness with initial cues for loudness. Everyday sentences produced with average 80dB with initial cue for loudness. SLP reiterated BID completion of loud /a/ and of everyday sentences. In conversation initially, (12 minutes)  pt held a conversational volume average of 70dB. Outdoors pt had 100% intelligibility with engine and parking lot noise. He will complete PT and OT screens in January at [facility] and further therapy (if recommended following those screens), and screens will be completed at this clinic.  07/24/23: Joshua Browning has been completing /a/ with consistency Pt with conversation of 15 minutes with average 69dB, During the next 13 minutes his loudness average decreased to average 67dB. He stated he had a conversation in the car with 3 others driving to [place] last week and nobody asked him to repeat. Outdoors, pt remained intelligible 100% of the time for 9 minutes, with car/parking lot noise, playground noise in the distance, as well as airport traffic overhead. Pt thought one more session would give him opportunity to verify progress over time and to "wrap up" the exercises in session with SLP.  07/17/23: Pt filled out CES today and answered with  scores 27/32 which is improved from 23/32. Average speaking volume 69dB for 20 minutes in conversation. SLP and pt discussed pt's ability to compensate for memory with compensations that are working successfully. Pt and SLP agreed he could be seen next Monday 07/24/23.   07/10/23: Pt enters speaking with average 68dB, stating wife has not asked him to repeat  SLP lead pt in conversation for 25 minutes - one cue for increasing loudness and pt averaged 68dB with door open for ST room. SLP suggested pt decr to once/week for progress. Pt agreed with this.  "  I'm using that calendar all the time" pt stated.   07/03/23: Pt has been practicing 10 minutes of louder speech practice each day as well. Loretta's wife [name] did not ask him to repeat except once or twice since last session. In 21 minutes simple-mod complex conversation SLP cued pt x4 for louder speech - average 68dB. In 15 minutes conversation (mod complex) pt averaged 67dB - SLP cued pt x2 during this time for loudness. SLP provided memory strategies for pt. He said it was more for road names, restaurant names, etc and not necessarily memory for events. SLP provided memory strategies anyway.  06/29/23:Loud /a/ and everyday sentences have been completed 4-5 times since last session. Pt now has all sentences those that he may say everyday. SLP targeted loud /a/ to increase muscle memory for louder speech in conversation. Average was 87dB, with initial cue to improve loudness. Pt counted first 6 reps on fingers, which assisted his length of production. Sentences were produced with average 82 dB. In 15 minutes of conversation pt req'd occasional min A for increasing volume to WNL.   06/26/23: Since last session, pt completed loud /a/ three times. SLP targeted loud /a/ to improve pt's muscle memory for louder speech in conversation. Average was 87dB, with initial cue to extend production. Everyday sentences were brought today but pt will have to revamp 4 of  them as they are not utterances he would say everyday. Today his average was 78dB with these sentences. In 9 minutes of simple conversation pt maintained loudness average of 68dB. SLP provided pt with homework for 15-20 minutes twice a day after loud /a/ BID and everyday sentences BID.  06/15/23: SLP targeted loud /a/ to increase muscle memory for louder speech in conversation. Average was 87dB, with initial cue to extend production. In 20 minutes of conversation pt averaged 68dB, with rare min A for incr'd abdominal recruitment and effort/loudness. SLP reiterated pt's need to practice with louder voice at home.  06/12/23: Today SLP assessed pt's voice in conversation for 18 minutes; average 68 dB with occasional min A. Conversation for 14 minutes averaged 67dB with occasional min A for loudness. SLP stressed to pt to cont to practice at home.   06/08/23: Loud /a/ completed with average 86dB with cues to extend /a/ length initially. Conversation after loud /a/ started at average 69dB then after 30 seconds decr'd to average 65dB. SLP worked with pt reading sentences with WNL volume. He req'd digital recorder cues to improve his loudness but was average 71 dB following this. In sentence completion tasks he was producing speech with average 68 dB with incr'd cognitive load necessary to complete sentences.   06/05/23: SLP provided CES today with pt scoring as above. SLP worked with pt again shAPing the loudness and mouth position (open mouth) with "ah". After 4 reps pt with "ah" with adequate volume and mouth posture at average 85 dB.  SLP instructed pt to read sentences using louder speech, and pt consistently not loud enough. When SLP utilized demo cues, pt attained 70dB, then SLP used digital recorder to incr pt's awareness of his softer than WNL volume and pt demonstrated he understood the difference by listening to recording (see "S" statement).  05/31/23: SLP shaped pt's loud /a/ over 7 minutes, and  educated about the rationale for this activity for homework. SLP used digital recording to assist with this shaping, and the explanation for rationale of loud /a/. SLP educated pt about he will feel like he is  talking TOO loudly and this is actually indication he is speaking at WNL volume.   PATIENT EDUCATION: Education details: see "today's treatment" Person educated: Patient Education method: Explanation, Demonstration, and Verbal cues Education comprehension: verbalized understanding, returned demonstration, verbal cues required, and needs further education  HOME EXERCISE PROGRAM: 10 loud /a/, read 15 sentences, complete one worksheet with loud/WNL loud voice   GOALS: Goals reviewed with patient? Yes  SHORT TERM GOALS: Target date: 07/07/23  Pt will demo loud /a/ average mid 80s dB in 3 sessions Baseline:06/05/23, 06/08/23 Goal status: Met  2.  Pt will respond with sentences with average 70dB over 2 sessions Baseline: 06/15/23 Goal status: met  3.  Pt will produce 3 minutes simple conversation with average 70dB with nonverbal cues over three sessions  Baseline: 06/29/23, 07/03/23 Goal status: Met   LONG TERM GOALS: Target date: 08/11/23  Pt will maintain loud /a/ average in mid 80s dB in 3 sessions after 07/07/23 Baseline: 07/31/23 Goal status: partially met  2.  Pt will produce 10 minutes simple-mod complex conversation with average 70dB over three sessions  Baseline: 06/29/23, 07/03/23 Goal status: met  3.  Pt will track appointment schedule and/or to-do lists via external cues during 3 sessions Baseline: 07/10/23, 07/17/23 Goal status: Met  4.  Pt will improve PROM score compared to initial administration Baseline:  Goal status: Met  5.  Pt will successfully use memory strategy between 3 sesions Baseline:  Goal status: DEFERRED    ASSESSMENT:  CLINICAL IMPRESSION: Patient is a 77 y.o. M who was seen today for treatment of speech clarity in light of PD. See  "today's treatment" for details.  Pt reported no issues this weekend with conversation with two sons and other family members. He continues to report wife is no longer asking him to repeat at the same frequency as prior to ST. He is using more memory strategies now than prior to this course of ST. Pt agrees with d/c today.    OBJECTIVE IMPAIRMENTS: Objective impairments include memory and dysarthria. These impairments are limiting patient from household responsibilities, ADLs/IADLs, and effectively communicating at home and in community.Factors affecting potential to achieve goals and functional outcome are  none noted today .Marland Kitchen Patient will benefit from skilled SLP services to address above impairments and improve overall function.  REHAB POTENTIAL: Good  PLAN: D/C today   PLANNED INTERVENTIONS: Environmental controls, Functional tasks, Multimodal communication approach, SLP instruction and feedback, Compensatory strategies, and Patient/family education    [facility], CCC-SLP 07/31/2023, 1:33 PM

## 2023-08-01 ENCOUNTER — Inpatient Hospital Stay: Payer: Medicare Other | Attending: Oncology | Admitting: Oncology

## 2023-08-01 ENCOUNTER — Inpatient Hospital Stay: Payer: Medicare Other

## 2023-08-01 VITALS — BP 121/77 | HR 79 | Temp 98.1°F | Resp 18 | Ht 70.0 in | Wt 140.0 lb

## 2023-08-01 DIAGNOSIS — G20A1 Parkinson's disease without dyskinesia, without mention of fluctuations: Secondary | ICD-10-CM | POA: Insufficient documentation

## 2023-08-01 DIAGNOSIS — C9110 Chronic lymphocytic leukemia of B-cell type not having achieved remission: Secondary | ICD-10-CM | POA: Diagnosis not present

## 2023-08-01 DIAGNOSIS — R1310 Dysphagia, unspecified: Secondary | ICD-10-CM | POA: Insufficient documentation

## 2023-08-01 DIAGNOSIS — R6881 Early satiety: Secondary | ICD-10-CM | POA: Diagnosis not present

## 2023-08-01 DIAGNOSIS — R634 Abnormal weight loss: Secondary | ICD-10-CM | POA: Diagnosis not present

## 2023-08-01 LAB — CBC WITH DIFFERENTIAL (CANCER CENTER ONLY)
Abs Immature Granulocytes: 0.02 10*3/uL (ref 0.00–0.07)
Basophils Absolute: 0.1 10*3/uL (ref 0.0–0.1)
Basophils Relative: 1 %
Eosinophils Absolute: 0.1 10*3/uL (ref 0.0–0.5)
Eosinophils Relative: 2 %
HCT: 40.2 % (ref 39.0–52.0)
Hemoglobin: 13.4 g/dL (ref 13.0–17.0)
Immature Granulocytes: 0 %
Lymphocytes Relative: 63 %
Lymphs Abs: 5.5 10*3/uL — ABNORMAL HIGH (ref 0.7–4.0)
MCH: 31.4 pg (ref 26.0–34.0)
MCHC: 33.3 g/dL (ref 30.0–36.0)
MCV: 94.1 fL (ref 80.0–100.0)
Monocytes Absolute: 0.5 10*3/uL (ref 0.1–1.0)
Monocytes Relative: 6 %
Neutro Abs: 2.4 10*3/uL (ref 1.7–7.7)
Neutrophils Relative %: 28 %
Platelet Count: 154 10*3/uL (ref 150–400)
RBC Morphology: NONE SEEN
RBC: 4.27 MIL/uL (ref 4.22–5.81)
RDW: 13.7 % (ref 11.5–15.5)
WBC Count: 8.6 10*3/uL (ref 4.0–10.5)
WBC Morphology: ABNORMAL
nRBC: 0 % (ref 0.0–0.2)

## 2023-08-01 NOTE — Progress Notes (Signed)
  Joshua Browning   Diagnosis: CLL  INTERVAL HISTORY:   Joshua Browning returns as scheduled.  He feels well.  Good appetite.  No fever or night sweats.  He feels there may be a new right cervical node.  No recent infection.  Objective:  Vital signs in last 24 hours:  Blood pressure 121/77, pulse 79, temperature 98.1 F (36.7 C), temperature source Temporal, resp. rate 18, height 5\' 10"  (1.778 m), weight 140 lb (63.5 kg), SpO2 100%.    HEENT: Neck without mass Lymphatics: Soft mobile 1/2-1 cm bilateral cervical, scalene, and axillary nodes.  Less than 1 cm submental node Resp: Lungs clear bilaterally Cardio: Regular rate and rhythm GI: No hepatosplenomegaly Vascular: No leg edema, left lower leg is larger than the right side  Lab Results:  Lab Results  Component Value Date   WBC 8.6 08/01/2023   HGB 13.4 08/01/2023   HCT 40.2 08/01/2023   MCV 94.1 08/01/2023   PLT 154 08/01/2023   NEUTROABS 2.4 08/01/2023    CMP  Lab Results  Component Value Date   NA 143 03/29/2023   K 4.0 03/29/2023   CL 106 03/29/2023   CO2 29 03/29/2023   GLUCOSE 87 03/29/2023   BUN 18 03/29/2023   CREATININE 0.94 03/29/2023   CALCIUM 9.6 03/29/2023   PROT 6.8 09/09/2022   ALBUMIN 4.5 09/09/2022   AST 15 09/09/2022   ALT 4 09/09/2022   ALKPHOS 46 09/09/2022   BILITOT 0.6 09/09/2022   GFRNONAA >60 03/29/2023    No results found for: "CEA1", "CEA", "UJW119", "CA125"  No results found for: "INR", "LABPROT"  Imaging:  No results found.  Medications: I have reviewed the patient's current medications.   Assessment/Plan: CLL Small bilateral cervical and axillary lymph nodes CT neck 04/29/2021-subcentimeter but abnormally numerous bilateral cervical lymph nodes.  Largest node 8 to 9 mm.  Small bilateral axillary lymph nodes.  Stable mediastinal lymph nodes appear relatively normal. Peripheral blood flow cytometry 05/13/2021-monoclonal lambda restricted B-cell  population with expression of CD20, CD5, and CD200 Weight loss Early satiety Mild dysphagia Parkinson's Right occipital/temporal and left lacunar CVAs January 2024     Disposition: Joshua Browning is stable from a hematologic standpoint.  He has stable small neck and axillary nodes.  He appears asymptomatic from CLL.  There is no indication for treating the CLL at present.  He will remain up-to-date on influenza and pneumonia vaccines.  He will seek medical attention for symptoms of an infection.  He will return for an office visit in 9 months.   Thornton Papas, MD  08/01/2023  3:21 PM

## 2023-08-19 ENCOUNTER — Other Ambulatory Visit: Payer: Self-pay | Admitting: Neurology

## 2023-08-19 DIAGNOSIS — G2581 Restless legs syndrome: Secondary | ICD-10-CM

## 2023-08-19 DIAGNOSIS — R251 Tremor, unspecified: Secondary | ICD-10-CM

## 2023-08-19 DIAGNOSIS — G20A1 Parkinson's disease without dyskinesia, without mention of fluctuations: Secondary | ICD-10-CM

## 2023-08-23 NOTE — Therapy (Signed)
 [facility] [address] [place], [place], [address] Phone: [phone]   Fax:  [phone]  Patient Details  Name: Joshua Browning MRN: [id number] Date of Birth: 1946-07-22 Referring Provider:  Evonnie Asberry RAMAN, DO  Encounter Date: 08/24/2023  Occupational Therapy Parkinson's Disease Screen  Hand dominance:  Rt handed   Physical Performance Test item #2 (simulated eating):  9.68 sec  Physical Performance Test item #4 (donning/doffing jacket):  13 sec (slightly slower)   Fastening/unfastening 3 buttons in:  30 sec (maintained)   9-hole peg test:    RUE  29 sec (slightly slower)    LUE  30 sec (slightly slower)  Change in ability to perform ADLs/IADLs:  denies changes, mild micrographia   Other Comments:  BUE ROM WFL's (improved in elbow ext and sh flex)   Pt does not require occupational therapy services at this time.  Recommended occupational therapy screen in  6 months (pt busy w/ wife's condition) - however would recommend more O.T. following next screen    Burnard JINNY Roads, OT 08/23/2023, 8:57 AM  [facility] [address] [place], [place], [address] Phone: [phone]   Fax:  [phone]

## 2023-08-24 ENCOUNTER — Ambulatory Visit: Payer: Medicare Other | Admitting: Occupational Therapy

## 2023-08-24 ENCOUNTER — Ambulatory Visit: Payer: Medicare Other | Attending: Family Medicine | Admitting: Physical Therapy

## 2023-08-24 ENCOUNTER — Ambulatory Visit: Payer: Medicare Other | Admitting: Speech Pathology

## 2023-08-24 DIAGNOSIS — R2681 Unsteadiness on feet: Secondary | ICD-10-CM | POA: Insufficient documentation

## 2023-08-24 DIAGNOSIS — R278 Other lack of coordination: Secondary | ICD-10-CM

## 2023-08-24 NOTE — Therapy (Signed)
 [facility] [address] [place], [place], [address] Phone: [phone]   Fax:  [phone]  Patient Details  Name: Joshua Browning MRN: [id number] Date of Birth: 1945/12/04 Referring Provider:  Okey Carlin Redbird, MD  Encounter Date: 08/24/2023  Physical Therapy Parkinson's Disease Screen   Timed Up and Go test: 10.6 seconds (previously 11.2 seconds)  10 meter walk test: 12.37 seconds = 2.65 ft/sec (previously 3.05 ft/sec, but pt more stiff in the morning)  5 time sit to stand test: 9.8 seconds (previously 11.6 seconds)  Pt discharged from PT recently in October 2024. No falls. Seems to being doing pretty good. Tries to go to the [facility] moves class every week.   Patient does not require Physical Therapy services at this time.  Recommend Physical Therapy screen in 6 months.     [place]ffield LOISE Senate, PT, DPT 08/24/2023, 8:04 AM   [facility] [address] [place], [place], [address] Phone: [phone]   Fax:  [phone]

## 2023-08-25 DIAGNOSIS — G43109 Migraine with aura, not intractable, without status migrainosus: Secondary | ICD-10-CM | POA: Diagnosis not present

## 2023-08-25 DIAGNOSIS — Z961 Presence of intraocular lens: Secondary | ICD-10-CM | POA: Diagnosis not present

## 2023-08-25 DIAGNOSIS — Z8673 Personal history of transient ischemic attack (TIA), and cerebral infarction without residual deficits: Secondary | ICD-10-CM | POA: Diagnosis not present

## 2023-08-29 ENCOUNTER — Other Ambulatory Visit: Payer: Self-pay | Admitting: Neurology

## 2023-08-29 DIAGNOSIS — G20A1 Parkinson's disease without dyskinesia, without mention of fluctuations: Secondary | ICD-10-CM

## 2023-09-06 ENCOUNTER — Ambulatory Visit (INDEPENDENT_AMBULATORY_CARE_PROVIDER_SITE_OTHER): Payer: Medicare Other

## 2023-09-06 ENCOUNTER — Encounter: Payer: Self-pay | Admitting: Cardiology

## 2023-09-06 ENCOUNTER — Ambulatory Visit: Payer: Medicare Other | Attending: Cardiology | Admitting: Cardiology

## 2023-09-06 VITALS — BP 130/62 | HR 72 | Ht 70.0 in | Wt 140.4 lb

## 2023-09-06 DIAGNOSIS — R0609 Other forms of dyspnea: Secondary | ICD-10-CM | POA: Diagnosis not present

## 2023-09-06 DIAGNOSIS — R600 Localized edema: Secondary | ICD-10-CM | POA: Diagnosis not present

## 2023-09-06 DIAGNOSIS — I4719 Other supraventricular tachycardia: Secondary | ICD-10-CM | POA: Diagnosis not present

## 2023-09-06 NOTE — Progress Notes (Unsigned)
Philips event monitor applied.

## 2023-09-06 NOTE — Progress Notes (Signed)
Cardiology Office Note:  .   Date:  09/06/2023  ID:  Joshua Browning, DOB 16-Feb-1946, MRN [id number] PCP: Daisy Floro, MD  [facility] Providers Cardiologist:  Truett Mainland, MD PCP: Daisy Floro, MD  Chief Complaint  Patient presents with   Exertional dyspnea      History of Present Illness: .    Joshua Browning is a 78 y.o. 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  He continues to cough currently, but does have dyspnea on exertion, unchanged compared to a year ago.  He has not had any new strokes.  He denies any palpitations.  Previously, 2-week Zio patch showed atrial tachycardia, but no atrial fibrillation.  He was lost to follow-up since his original consultation visit.  This may have had to do with scheduling error from our office, rather than patient noncompliance    Vitals:   09/06/23 1125  BP: 130/62  Pulse: 72  SpO2: 99%     ROS:  Review of Systems  Cardiovascular:  Positive for dyspnea on exertion. Negative for chest pain, leg swelling, palpitations and syncope.     Studies Reviewed: Marland Kitchen         Independently interpreted 06/2023: Chol 114, TG 53, HDL 45, LDL 57 HbA1C 5.5% Hb 13.4 Cr 0.9   Mobile cardiac telemetry 13 days 09/30/2022 - 10/14/2022: Dominant rhythm: Sinus. HR 45-134 bpm. Avg HR 69 bpm. 18 episodes of probable atrial tachycardia, fastest at 194 bpm for 8 beats, longest for 34.6 secs at 124 bpm. <1% isolated SVE, couplet/triplets. 0 episodes of VT. <1% isolated VE, couplet/triplets. No atrial fibrillation/atrial flutter/VT/high grade AV block, sinus pause >3sec noted. 4 patient triggered events correlated with sinus rhythm, occasional SVE.   Echocardiogram 09/30/2022:  Normal LV systolic function with visual EF 55-60%. Left ventricle cavity  is normal in size. Normal left ventricular wall thickness.  Normal global  wall motion. Normal diastolic filling pattern. Calculated EF 60%.  Negative bubble study.  Trace mitral regurgitation. Mild mitral valve leaflet calcification.  Structurally normal tricuspid valve.  Mild tricuspid regurgitation. No  evidence of pulmonary hypertension.  IVC is dilated with respiratory variation.  No prior available for comparison.   Regadenoson Nuclear stress test  10/17/2022: There is a fixed mild defect in the inferior and apical regions consistent with diaphragmatic attenuation. No ischemia or scar. .  Overall LV systolic function is normal without regional wall motion abnormalities. Stress LV EF: 62%.  Non-diagnostic ECG stress. The heart rate response was consistent with Regadenoson.  No previous exam available for comparison. Low risk.   Carotid duplex 09/2022: Color duplex indicates minimal homogeneous plaque, with no hemodynamically significant stenosis by duplex criteria in the extracranial cerebrovascular circulation.   MRI brain 09/2022: 1. Recent right occipital and posterior temporal cortex infarct. 2. Subacute lacunar infarct in the deep left cerebral white matter. 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 3. Chronic cervical adenopathy also seen on neck CT from 2022, question low-grade lymphoma such as CLL.      Physical Exam:   Physical Exam Vitals and nursing note reviewed.  Constitutional:      General: He is not in acute distress.    Appearance: He is underweight.  Neck:     Vascular: No JVD.  Cardiovascular:     Rate and Rhythm: Normal rate and regular rhythm.     Heart sounds: Normal heart sounds. No  murmur heard. Pulmonary:     Effort: Pulmonary effort is normal.     Breath sounds: Normal breath sounds. No wheezing or rales.  Musculoskeletal:     Right lower leg: No edema.     Left lower leg: No edema.      VISIT DIAGNOSES:   ICD-10-CM   1. Paroxysmal atrial tachycardia (HCC)  I47.19 Cardiac event monitor    2.  Dyspnea on exertion  R06.09 Pro b natriuretic peptide (BNP)    ECHOCARDIOGRAM COMPLETE    Pro b natriuretic peptide (BNP)    3. Leg edema  R60.0 ECHOCARDIOGRAM COMPLETE       ASSESSMENT AND PLAN: .    Joshua Browning is a 78 y.o. 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  He has had some leg edema recently.  Will repeat echocardiogram.  Stroke: Multifocal stroke, embolic cause was suspected.  Prior workup with carotid duplex, echocardiogram bubble study, and 2-week Zio patch monitor did not show any clear etiology.  His Zio patch monitor had shown atrial tachycardia, but not atrial fibrillation.  I will repeat 30-day event monitor at this time.  If event monitor shows A-fib, he will need anticoagulation.  If it does not show A-fib, consider loop recorder placement.  F/u in 6 months  Signed, Elder Negus, MD

## 2023-09-06 NOTE — Patient Instructions (Addendum)
Medication Instructions:  Your physician recommends that you continue on your current medications as directed. Please refer to the Current Medication list given to you today.  *If you need a refill on your cardiac medications before your next appointment, please call your pharmacy*   Lab Work: TODAY: Pro BNP If you have labs (blood work) drawn today and your tests are completely normal, you will receive your results only by: MyChart Message (if you have MyChart) OR A paper copy in the mail If you have any lab test that is abnormal or we need to change your treatment, we will call you to review the results.   Testing/Procedures: Your physician has requested that you have an echocardiogram. Echocardiography is a painless test that uses sound waves to create images of your heart. It provides your doctor with information about the size and shape of your heart and how well your heart's chambers and valves are working. This procedure takes approximately one hour. There are no restrictions for this procedure. Please do NOT wear cologne, perfume, aftershave, or lotions (deodorant is allowed). Please arrive 15 minutes prior to your appointment time.  Please note: We ask at that you not bring children with you during ultrasound (echo/ vascular) testing. Due to room size and safety concerns, children are not allowed in the ultrasound rooms during exams. Our front office staff cannot provide observation of children in our lobby area while testing is being conducted. An adult accompanying a patient to their appointment will only be allowed in the ultrasound room at the discretion of the ultrasound technician under special circumstances. We apologize for any inconvenience.   Your physician has recommended that you wear an event monitor. Event monitors are medical devices that record the heart's electrical activity. Doctors most often [place] these monitors to diagnose arrhythmias. Arrhythmias are problems with  the speed or rhythm of the heartbeat. The monitor is a small, portable device. You can wear one while you do your normal daily activities. This is usually used to diagnose what is causing palpitations/syncope (passing out).  SHELLY WILL PLACE MONITOR ON THE PATIENT TODAY WHILE IN CLINIC   Follow-Up: At [facility], you and your health needs are our priority.  As part of our continuing mission to provide you with exceptional heart care, we have created designated Provider Care Teams.  These Care Teams include your primary Cardiologist (physician) and Advanced Practice Providers (APPs -  Physician Assistants and Nurse Practitioners) who all work together to provide you with the care you need, when you need it.  We recommend signing up for the patient portal called "MyChart".  Sign up information is provided on this After Visit Summary.  MyChart is used to connect with patients for Virtual Visits (Telemedicine).  Patients are able to view lab/test results, encounter notes, upcoming appointments, etc.  Non-urgent messages can be sent to your provider as well.   To learn more about what you can do with MyChart, go to [email or web address].    Your next appointment:   6 MONTHS  Provider:   Dr. Rosemary Holms  Other Instructions   1st Floor: - Lobby - Registration  - Pharmacy  - Lab - Cafe  2nd Floor: - PV Lab - Diagnostic Testing (echo, CT, nuclear med)  3rd Floor: - Vacant  4th Floor: - TCTS (cardiothoracic surgery) - AFib Clinic - Structural Heart Clinic - Vascular Surgery  - Vascular Ultrasound  5th Floor: - HeartCare Cardiology (general and EP) - Clinical Pharmacy for coumadin, hypertension, lipid,  weight-loss medications, and med management appointments    Valet parking services will be available as well.

## 2023-09-07 ENCOUNTER — Encounter: Payer: Self-pay | Admitting: Cardiology

## 2023-09-07 LAB — PRO B NATRIURETIC PEPTIDE: NT-Pro BNP: 119 pg/mL (ref 0–486)

## 2023-09-12 NOTE — Progress Notes (Deleted)
 Assessment/Plan:   1.  Parkinsons Disease   -Continue carbidopa/levodopa 25/100, 2 tablets 3 times per day.    -Continue carbidopa/levodopa 50/200 at bedtime  -Continue clonazepam 0.5 mg, half tablet at bedtime for cramping and sleep.  Start taking that 30 min before bedtime to get it to kick in when gets into bed.  -He is working on utilizing trekking poles   2.  MCI   -Patient had neurocognitive testing with Dr. Alinda Dooms in 2017.  Repeat testing in 10/2022 demonstrated MCI again, although potentially a bit worse (but he had had a stroke not long prior).  Wife notes memory struggles but discussed that AChI are not for MCI.  We discussed the newer generation medications for Alzheimer's, but I do not think he has Alzheimer's.  We did discuss lumbar puncture for Alzheimer's and the newer medicines for this, but he is not interested in those, nor did I recommend them.  -OT driving eval in 10/863 at [facility] was good   3.  Mild depression/insomnia  -Doing well with mirtazapine, 15 mg at bedtime.  Refill today.  4.  B12 deficiency  -On oral supplementation  -recheck level as has been few years  5.  Diplopia  -This was due to dislocated intraocular lens implant.  Resolved after sx  6.  CLL  -Diagnosed September, 2022.  -Following with oncology.  7.  Basal cell carcinoma  -Following with dermatology.  8.  Weight loss  -Patient previously with extensive negative workup.              -he is already taking in extra protein  -not due to Parkinsons Disease  -weight is stable today             -on mirtazapine from me, which should, in theory, cause weight gain  9.  Ankle/feet paresthesias  -EMG negative for large fiber neuropathy.  -MRI lumbar spine with evidence of left S1 radiculopathy.  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  Patient without back pain.  10.  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  -Talked about importance of blood pressure control with a goal <130/80 mm Hg.   - Goal LDL less than 70.  On Lipitor and they report that they are at goal now.     Subjective:   Joshua Browning was seen today in follow up for Parkinsons disease.  My previous records were reviewed prior to todays visit as well as outside records available to me.  Patient is with his wife who helps to supplement the history.  Patient has been to speech-language pathology since our last visit as well as some physical therapy.  Occupational Therapy has just gotten started.  Notes are reviewed.  Patient just saw his cardiologist January 22.  Notes are reviewed.  He was seen for exertional dyspnea.  Workup was negative.  They are going to repeat his 30-day event monitor.  Current prescribed movement disorder medications: Carbidopa/levodopa 25/100, 2 tablets 3 times per day  Carbidopa/levodopa 50/200 at bedtime Mirtazapine, 15 mg at bedtime Clonazepam 0.5 mg, half a tablet at bedtime  B12 supplement    ALLERGIES:  No Known Allergies  CURRENT MEDICATIONS:  Outpatient Encounter Medications as of 09/14/2023  Medication Sig   acetaminophen (TYLENOL) 500 MG tablet Take 1 tablet (500 mg total) by mouth every 6 (six) hours as needed.   aspirin EC 81 MG  tablet Take 81 mg by mouth daily. Swallow whole.   atorvastatin (LIPITOR) 20 MG tablet Take 1 tablet (20 mg total) by mouth daily.   carbidopa-levodopa (SINEMET CR) 50-200 MG tablet TAKE 1 TABLET BY MOUTH EVERYDAY AT BEDTIME   carbidopa-levodopa (SINEMET IR) 25-100 MG tablet TAKE 2 TABLETS BY MOUTH 3 TIMES A DAY   Cholecalciferol (VITAMIN D3) 1000 UNITS CAPS Take 1 capsule by mouth every evening.   clonazePAM (KLONOPIN) 0.5 MG tablet TAKE 1/2 TABLET BY MOUTH EVERY DAY AT BEDTIME   cyanocobalamin 1000 MCG tablet Take 1,000 mcg by mouth daily.   famotidine (PEPCID) 20 MG tablet Take 20 mg by mouth at bedtime. Takes  pepcid ac   Glucosamine Sulfate 1000 MG CAPS Take by mouth daily.    Magnesium 100 MG CAPS Take 1 capsule by mouth every evening.   mirtazapine (REMERON) 15 MG tablet Take 1 tablet (15 mg total) by mouth at bedtime.   Potassium 99 MG TABS Take by mouth daily.   No facility-administered encounter medications on file as of 09/14/2023.    Objective:   PHYSICAL EXAMINATION:    VITALS:   There were no vitals filed for this visit.  Wt Readings from Last 3 Encounters:  09/06/23 140 lb 6.4 oz (63.7 kg)  08/01/23 140 lb (63.5 kg)  04/27/23 141 lb 1.6 oz (64 kg)   GEN:  The patient appears stated age and is in NAD. HEENT:  Normocephalic, atraumatic.  The mucous membranes are moist.  Cv:  rrr Lungs:  ctab   Neurological examination:   Orientation: The patient is alert and oriented x3.  Supplies own hx without trouble Cranial nerves: There is good facial symmetry with minimal facial hypomimia. The speech is fluent and clear. Soft palate rises symmetrically and there is no tongue deviation. Hearing is intact to conversational tone. Sensation: Sensation is intact to light touch throughout.   Motor: Strength is at least antigravity x4.     Movement examination: Tone: There is nl tone in the bilateral UE Abnormal movements: no tremor today Coordination:  There is no decremation with any form of RAMS, including alternating supination and pronation of the forearm, hand opening and closing, finger taps, heel taps and toe taps.  Gait and Station: The patient arises without using his hands.  He is just slightly forward flexed today.  Pt walks well today  I have reviewed and interpreted the following labs independently    Chemistry      Component Value Date/Time   NA 143 03/29/2023 1505   K 4.0 03/29/2023 1505   CL 106 03/29/2023 1505   CO2 29 03/29/2023 1505   BUN 18 03/29/2023 1505   CREATININE 0.94 03/29/2023 1505   CREATININE 0.89 05/13/2021 1512   CREATININE 0.86 06/25/2019 1049       Component Value Date/Time   CALCIUM 9.6 03/29/2023 1505   ALKPHOS 46 09/09/2022 0900   AST 15 09/09/2022 0900   AST 16 05/13/2021 1512   ALT 4 09/09/2022 0900   ALT 5 05/13/2021 1512   BILITOT 0.6 09/09/2022 0900   BILITOT 0.5 05/13/2021 1512       Lab Results  Component Value Date   WBC 8.6 08/01/2023   HGB 13.4 08/01/2023   HCT 40.2 08/01/2023   MCV 94.1 08/01/2023   PLT 154 08/01/2023    Lab Results  Component Value Date   TSH 1.49 06/25/2019   Lab Results  Component Value Date   VITAMINB12 >1500 (H) 09/09/2022  Total time spent on today's visit was *** minutes, including both face-to-face time and nonface-to-face time.  Time included that spent on review of records (prior notes available to me/labs/imaging if pertinent), discussing treatment and goals, answering patient's questions and coordinating care.  Cc:  Daisy Floro, MD

## 2023-09-14 ENCOUNTER — Ambulatory Visit: Payer: Self-pay | Admitting: Neurology

## 2023-09-18 NOTE — Progress Notes (Signed)
 Assessment/Plan:   1.  Parkinsons Disease   -Continue carbidopa /levodopa  25/100, 2 tablets 3 times per day.    -Continue carbidopa /levodopa  50/200 at bedtime  -Continue clonazepam  0.5 mg, half tablet at bedtime for cramping and sleep.  Start taking that 30 min before bedtime to get it to kick in when gets into bed.  -He is working on utilizing trekking poles   2.  MCI   -Patient had neurocognitive testing with Dr. Judeen in 2017.  Repeat testing in 10/2022 demonstrated MCI again, although potentially a bit worse (but he had had a stroke not long prior).  Wife notes memory struggles but discussed that AChI are not for MCI.  We discussed the newer generation medications for Alzheimer's, but I do not think he has Alzheimer's.  We did discuss lumbar puncture for Alzheimer's and the newer medicines for this, but he is not interested in those, nor did I recommend them.  -OT driving eval in 10/7973 at [facility] was good   3.  Mild depression/insomnia  -Doing well with mirtazapine , 15 mg at bedtime.  Refill today.  4.  B12 deficiency  -On oral supplementation  -recheck level as has been few years  5.  Diplopia  -This was due to dislocated intraocular lens implant.  Resolved after sx  6.  CLL  -Diagnosed September, 2022.  -Following with oncology.  7.  Basal cell carcinoma  -Following with dermatology.  8.  Ankle/feet paresthesias  -EMG negative for large fiber neuropathy.  -MRI lumbar spine with evidence of left S1 radiculopathy.  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  Patient without back pain.  10.  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  -Talked about importance of blood pressure control with a goal <130/80 mm Hg.   - Goal LDL less than 70.  On Lipitor and they report that they are at goal now.     Subjective:   Joshua Browning was seen  today in follow up for Parkinsons disease.  My previous records were reviewed prior to todays visit as well as outside records available to me.  Patient is with his wife who helps to supplement the history.  Patient has been to speech-language pathology since our last visit as well as some physical therapy.  Occupational Therapy screen was done.  Notes are reviewed.  Patient just saw his cardiologist January 22.  Notes are reviewed.  He was seen for exertional dyspnea.  Workup was negative.  They are going to repeat his 30-day event monitor.  He is wearing that now.  He is at [employer] and has just joined there and is started at the walking track.    Current prescribed movement disorder medications: Carbidopa /levodopa  25/100, 2 tablets 3 times per day  Carbidopa /levodopa  50/200 at bedtime Mirtazapine , 15 mg at bedtime Clonazepam  0.5 mg, half a tablet at bedtime  B12 supplement    ALLERGIES:  No Known Allergies  CURRENT MEDICATIONS:  Outpatient Encounter Medications as of 09/19/2023  Medication Sig   acetaminophen  (TYLENOL ) 500 MG tablet Take 1 tablet (500 mg total) by mouth every 6 (six) hours as needed.   aspirin EC 81 MG tablet Take 81 mg by mouth daily. Swallow whole.   atorvastatin  (LIPITOR) 20 MG tablet Take 1 tablet (20 mg total) by mouth daily.   carbidopa -levodopa  (SINEMET  CR) 50-200 MG tablet TAKE 1 TABLET BY MOUTH EVERYDAY AT BEDTIME  carbidopa -levodopa  (SINEMET  IR) 25-100 MG tablet TAKE 2 TABLETS BY MOUTH 3 TIMES A DAY   Cholecalciferol (VITAMIN D3) 1000 UNITS CAPS Take 1 capsule by mouth every evening.   clonazePAM  (KLONOPIN ) 0.5 MG tablet TAKE 1/2 TABLET BY MOUTH EVERY DAY AT BEDTIME   cyanocobalamin  1000 MCG tablet Take 1,000 mcg by mouth daily.   famotidine (PEPCID) 20 MG tablet Take 20 mg by mouth at bedtime. Takes pepcid ac   Glucosamine Sulfate 1000 MG CAPS Take by mouth daily.    Magnesium 100 MG CAPS Take 1 capsule by mouth every evening.   mirtazapine  (REMERON ) 15 MG  tablet Take 1 tablet (15 mg total) by mouth at bedtime.   Potassium 99 MG TABS Take by mouth daily.   No facility-administered encounter medications on file as of 09/19/2023.    Objective:   PHYSICAL EXAMINATION:    VITALS:   Vitals:   09/19/23 1517  BP: 130/68  Pulse: 71  SpO2: 98%  Weight: 140 lb (63.5 kg)  Height: 5' 10 (1.778 m)    Wt Readings from Last 3 Encounters:  09/19/23 140 lb (63.5 kg)  09/06/23 140 lb 6.4 oz (63.7 kg)  08/01/23 140 lb (63.5 kg)   GEN:  The patient appears stated age and is in NAD. HEENT:  Normocephalic, atraumatic.  The mucous membranes are moist.  Cv:  rrr Lungs:  ctab   Neurological examination:   Orientation: The patient is alert and oriented x3.  Supplies own hx without trouble Cranial nerves: There is good facial symmetry with minimal facial hypomimia. The speech is fluent and clear. Soft palate rises symmetrically and there is no tongue deviation. Hearing is intact to conversational tone. Sensation: Sensation is intact to light touch throughout.   Motor: Strength is at least antigravity x4.     Movement examination: Tone: There is nl tone in the bilateral UE Abnormal movements: no tremor today Coordination:  There is no decremation with any form of RAMS, including alternating supination and pronation of the forearm, hand opening and closing, finger taps, heel taps and toe taps.  Gait and Station: The patient pushes off to arise.  He is a bit unbalanced.  He flexes at the knees.  I have reviewed and interpreted the following labs independently    Chemistry      Component Value Date/Time   NA 143 03/29/2023 1505   K 4.0 03/29/2023 1505   CL 106 03/29/2023 1505   CO2 29 03/29/2023 1505   BUN 18 03/29/2023 1505   CREATININE 0.94 03/29/2023 1505   CREATININE 0.89 05/13/2021 1512   CREATININE 0.86 06/25/2019 1049      Component Value Date/Time   CALCIUM  9.6 03/29/2023 1505   ALKPHOS 46 09/09/2022 0900   AST 15 09/09/2022 0900    AST 16 05/13/2021 1512   ALT 4 09/09/2022 0900   ALT 5 05/13/2021 1512   BILITOT 0.6 09/09/2022 0900   BILITOT 0.5 05/13/2021 1512       Lab Results  Component Value Date   WBC 8.6 08/01/2023   HGB 13.4 08/01/2023   HCT 40.2 08/01/2023   MCV 94.1 08/01/2023   PLT 154 08/01/2023    Lab Results  Component Value Date   TSH 1.49 06/25/2019   Lab Results  Component Value Date   VITAMINB12 >1500 (H) 09/09/2022     Cc:  Okey Carlin Redbird, MD

## 2023-09-19 ENCOUNTER — Encounter: Payer: Self-pay | Admitting: Neurology

## 2023-09-19 ENCOUNTER — Ambulatory Visit: Payer: Medicare Other | Admitting: Neurology

## 2023-09-19 DIAGNOSIS — G20A2 Parkinson's disease without dyskinesia, with fluctuations: Secondary | ICD-10-CM | POA: Diagnosis not present

## 2023-09-19 MED ORDER — CARBIDOPA-LEVODOPA ER 50-200 MG PO TBCR
EXTENDED_RELEASE_TABLET | ORAL | 2 refills | Status: DC
Start: 2023-09-19 — End: 2024-05-23

## 2023-09-19 NOTE — Patient Instructions (Signed)
 Local and Online Resources for Power over Parkinson's Group?  January 2025 ?  LOCAL [place] PARKINSON'S GROUPS??  Power over Parkinson's Group:???  Upcoming Power over [employer] Meetings/Care Partner Support:? 2nd Wednesdays of the month at 2 pm:  January 8th, February 12th Contact Joshua Browning at [email or web address] or Joshua Browning at [email or web address] if interested in participating in this group?  ?  LOCAL EVENTS AND NEW OFFERINGS?  Dance Project Spring 2025:  January 14-May 20, Tuesdays 10-11 am.  All details on website: [email or web address] ACT FITNESS Chair Yoga classes "Train and Gain", Fridays 10 am, [employer].  Contact Joshua Browning at [phone].   PWR! Moves Paint class!  Wednesdays at 10 am.  Please contact Joshua Browning, PT at [email or web address] if interested. Health visitor Classes offering at [employer]!? Tuesdays (Chair Yoga)  and Wednesdays (PWR! Moves)  1:00 pm.?? Contact Joshua Browning [phone] or [email or web address] Drumming for Parkinson's will be held on 2nd and 4th Mondays at 11:00 am.?? Located at the [place] of the [place] ([address] [place]. [place].) *Next class is January 13th.? Contact Joshua Browning at [email or web address] or [phone]?  [employer] Parkinson's Tai Chi Class, Mondays at 11 am.  Call [phone] for details  [facility] at [facility]- [address], [place] Wednesdays- 4:00 - 5:00 PM - specifically for Parkinson's Disease.  Free!  Contact Joshua Browning, [place] - [phone] (clinic) or  [phone] (cell) or by email: [email or web address]   ?ONLINE EDUCATION AND SUPPORT?  Parkinson Foundation:? www.parkinson.org?  PD Health at Home continues:? Mindfulness Mondays, Wellness Wednesdays, Fitness Fridays??  Upcoming Education:??  Empowerment through Movement, Wednesday,  January 15th, 1-2 pm A Deep Dive into Deep Brain Stimulation (DBS), Wednesday, January 29th, 1-2 pm Expert Briefing:    Stay tuned Register for virtual education and expert briefings (webinars) at [email or web address]  Please check out their website to sign up for emails and see their full online offerings??  ?  [employer] Foundation:? www.michaeljfox.org??  Third Thursday Webinars:? On the third Thursday of every month at 12 p.m. ET, join our free live webinars to learn about various aspects of living with Parkinson's disease and our work to speed medical breakthroughs.?  Upcoming Webinar:? Managing the Hidden Symptoms:  Mood and Motivation Changes in Parkinson's.  Thursday, January 16th at 12 noon.  Check out additional information on their website to see their full online offerings?  ?  [employer]:? www.[email or web address].org?  Upcoming Webinar:   Stay tuned Series:? Living with Parkinson's Meetup.?? Third Thursdays each month, 3 pm?  Care Partner Monthly Meetup.? With Joshua Browning.? First Tuesday of each month, 2 pm?  Check out additional information to Live Well Today on their website?  ?  Parkinson and Movement Disorders (PMD) Alliance:? www.pmdalliance.org?  NeuroLife Online:? Online Education Events?  Sign up for emails, which are sent weekly to give you updates on programming and online offerings?  ?  Parkinson's Association of the [place]:? www.parkinsonassociation.org?  Information on online support groups, education events, and online exercises including Yoga, Parkinson's exercises and more-LOTS of information on links to PD resources and online events?  Virtual Support Group through [employer] of the [place]-First Wednesday of each month at 2 pm   MOVEMENT AND EXERCISE OPPORTUNITIES?  PWR! Moves [place] class has returned!  Wednesdays at 10 am.  Please contact Joshua Browning, PT at  [email or web address] if interested. Parkinson's Exercise Class offerings at [employer]. Tuesdays (Chair yoga) and Wednesdays (PWR! Moves)  1:00  pm.?  Contact Joshua Browning [phone] or [email or web address]  Parkinson's Wellness Recovery (PWR! Moves)? www.pwr4life.org?  Info on the PWR! Virtual Experience:? You will have access to our expertise?through self-assessment, guided plans that start with the PD-specific fundamentals, educational content, tips, Q&A with an expert, and a growing Engineering geologist of PD-specific pre-recorded and live exercise classes of varying types and intensity - both physical and cognitive! If that is not enough, we offer 1:1 wellness consultations (in-person or virtual) to personalize your PWR! Dance movement psychotherapist.??  Parkinson [employer] Fridays:??  As part of the PD Health @ Home program, this free video series focuses each week on one aspect of fitness designed to support people living with Parkinson's.? These weekly videos highlight the Parkinson Foundation fitness guidelines for people with Parkinson's disease.?  MenusLocal.com.[email or web address]?  Dance for PD website is offering free, live-stream classes throughout the week, as well as links to [employer] of classes:? https://[email or web address]/?  Virtual dance and Pilates for Parkinson's classes: Click on the Community Tab> Parkinson's Movement Initiative Tab.? To register for classes and for more information, visit www.[email or web address].co.[email or web address] and click the "community" tab.??  [employer] Parkinson's Cycling Classes??  [employer]:? Thursdays @ Noon-Live classes at [employer] ([employer] at [email or web address]?or [phone])?  [employer]: Classes Tuesday, Wednesday and Thursday (contact [place] at [email or web address] ?or [phone])?  [employer]?  Varied levels of classes are offered Tuesdays and  Thursdays at [facility]??  Stretching with Byrd Hesselbach weekly class is also offered for people with Parkinson's?  To observe a class or for more information, call [phone] or email Patricia Nettle at [email or web address]?    ADDITIONAL SUPPORT AND RESOURCES?  [employer]:  Chiropractor:? www.well-springsolutions.org/caregiver-education/caregiver-support-group.? You may also contact Loleta Chance at [facility] -spring.org or [phone].????  Well[employer]Spring Navigator:? Just1Navigator program, a?free service to help individuals and families through the journey of determining care for older adults.? The "Navigator" is a Child psychotherapist, Sidney Ace, who will speak with a prospective client and/or loved ones to provide an assessment of the situation and a set of recommendations for a personalized care plan -- all free of charge, and whether?[employer] offers the needed service or not. If the need is not a service we provide, we are well-connected with reputable programs in town that we can refer you to.? www.well-springsolutions.org or to speak with the Navigator, call [phone].?

## 2023-09-25 ENCOUNTER — Telehealth: Payer: Self-pay

## 2023-09-25 NOTE — Telephone Encounter (Signed)
   Cardiac Monitor Alert  Date of alert:  09/25/2023   Patient Name: Joshua Browning  DOB: 02/13/1946  MRN: [id number]   [facility] Cardiologist: None  [facility] EP:  None    Monitor Information: Cardiac Event Monitor [[employer]]  Reason:  SVT Ordering provider:  Patwardhan,MD   Alert Bradycardia - slowest HR: 30 with multiple pauses.  Longest pause 2.4 seconds and junctional rhythm This is the 1st alert for this rhythm.   Next Cardiology Appointment   Date:  09/27/2023  Provider:  Echocardiogram - Follow up with Dr Patwarhan 02/2024  The patient was contacted today.  He is asymptomatic.  Pt states he was watching TV Friday evening and it is possible that he dosed off but does not recall specifically. Arrhythmia, symptoms and history reviewed with Dr Filiberto Hug.   Plan:  Per Dr Filiberto Hug continue monitoring at this time.    Arva Lathe, RN  09/25/2023 8:08 AM

## 2023-09-27 ENCOUNTER — Ambulatory Visit (HOSPITAL_COMMUNITY): Payer: Medicare Other | Attending: Cardiology

## 2023-09-27 DIAGNOSIS — R600 Localized edema: Secondary | ICD-10-CM | POA: Diagnosis not present

## 2023-09-27 DIAGNOSIS — R0609 Other forms of dyspnea: Secondary | ICD-10-CM

## 2023-09-27 LAB — ECHOCARDIOGRAM COMPLETE
Area-P 1/2: 2.5 cm2
S' Lateral: 2.83 cm

## 2023-09-28 ENCOUNTER — Encounter: Payer: Self-pay | Admitting: Cardiology

## 2023-10-10 NOTE — Progress Notes (Signed)
 1 episode of sinus bradycardia and 26 bpm (09/22/2023) at 21: 18 EST., with no reported symptoms.  Is it possible that you could be sleeping at this time? No other arrhythmia noted.  No atrial fibrillation seen.  Thanks MJP

## 2023-10-11 ENCOUNTER — Telehealth: Payer: Self-pay | Admitting: Cardiology

## 2023-10-11 NOTE — Telephone Encounter (Signed)
 I think this has been addressed.  Thanks MJP

## 2023-10-11 NOTE — Progress Notes (Signed)
 No further action needed at this time.  Thanks MJP

## 2023-10-11 NOTE — Telephone Encounter (Signed)
 Pt returning call for monitor results, requesting cb

## 2023-10-11 NOTE — Telephone Encounter (Signed)
  Loa Socks, LPN 0/62/3762 83:15 AM EST     The patients wife [name] (on [place])  has been notified of the result and verbalized understanding.  All questions (if any) were answered.   Wife states the pt was sleeping during the episode of bradycardia.  She states he has no cardiac complaints and currently at his parkinson's class right now, as he attends several times weekly.   [name] is aware that I will let Dr. Rosemary Holms know he was sleeping at the time of the bradycardic episode, and was asymptomatic.   [name] verbalized understanding and agrees with this plan.   Loa Socks, LPN 1/76/1607  3:71 PM EST     Left message for the pt to call back for results.   Elder Negus, MD 10/10/2023  3:32 PM EST     1 episode of sinus bradycardia and 26 bpm (09/22/2023) at 21: 18 EST., with no reported symptoms.  Is it possible that you could be sleeping at this time? No other arrhythmia noted.  No atrial fibrillation seen.   Thanks MJP

## 2023-10-17 DIAGNOSIS — R350 Frequency of micturition: Secondary | ICD-10-CM | POA: Diagnosis not present

## 2023-10-26 ENCOUNTER — Other Ambulatory Visit: Payer: Self-pay | Admitting: Neurology

## 2023-10-26 DIAGNOSIS — R251 Tremor, unspecified: Secondary | ICD-10-CM

## 2023-10-26 DIAGNOSIS — G20A1 Parkinson's disease without dyskinesia, without mention of fluctuations: Secondary | ICD-10-CM

## 2023-11-10 ENCOUNTER — Other Ambulatory Visit: Payer: Self-pay | Admitting: Neurology

## 2023-11-10 DIAGNOSIS — R251 Tremor, unspecified: Secondary | ICD-10-CM

## 2023-11-10 DIAGNOSIS — G20A1 Parkinson's disease without dyskinesia, without mention of fluctuations: Secondary | ICD-10-CM

## 2023-11-10 DIAGNOSIS — G2581 Restless legs syndrome: Secondary | ICD-10-CM

## 2023-11-27 ENCOUNTER — Other Ambulatory Visit: Payer: Self-pay | Admitting: Neurology

## 2023-11-27 DIAGNOSIS — G20A1 Parkinson's disease without dyskinesia, without mention of fluctuations: Secondary | ICD-10-CM

## 2023-11-27 MED ORDER — CLONAZEPAM 0.5 MG PO TABS
ORAL_TABLET | ORAL | 1 refills | Status: DC
Start: 2023-11-27 — End: 2023-11-27

## 2023-11-27 MED ORDER — CLONAZEPAM 0.5 MG PO TABS
ORAL_TABLET | ORAL | 1 refills | Status: DC
Start: 2023-11-27 — End: 2024-05-27

## 2024-01-16 DIAGNOSIS — Z85828 Personal history of other malignant neoplasm of skin: Secondary | ICD-10-CM | POA: Diagnosis not present

## 2024-01-16 DIAGNOSIS — D1801 Hemangioma of skin and subcutaneous tissue: Secondary | ICD-10-CM | POA: Diagnosis not present

## 2024-01-16 DIAGNOSIS — D0422 Carcinoma in situ of skin of left ear and external auricular canal: Secondary | ICD-10-CM | POA: Diagnosis not present

## 2024-01-16 DIAGNOSIS — L570 Actinic keratosis: Secondary | ICD-10-CM | POA: Diagnosis not present

## 2024-01-16 DIAGNOSIS — L821 Other seborrheic keratosis: Secondary | ICD-10-CM | POA: Diagnosis not present

## 2024-02-07 ENCOUNTER — Other Ambulatory Visit: Payer: Self-pay | Admitting: Neurology

## 2024-02-07 DIAGNOSIS — R251 Tremor, unspecified: Secondary | ICD-10-CM

## 2024-02-07 DIAGNOSIS — G20A1 Parkinson's disease without dyskinesia, without mention of fluctuations: Secondary | ICD-10-CM

## 2024-02-07 DIAGNOSIS — G2581 Restless legs syndrome: Secondary | ICD-10-CM

## 2024-02-22 DIAGNOSIS — C9110 Chronic lymphocytic leukemia of B-cell type not having achieved remission: Secondary | ICD-10-CM | POA: Diagnosis not present

## 2024-02-22 DIAGNOSIS — E7800 Pure hypercholesterolemia, unspecified: Secondary | ICD-10-CM | POA: Diagnosis not present

## 2024-02-22 DIAGNOSIS — Z79899 Other long term (current) drug therapy: Secondary | ICD-10-CM | POA: Diagnosis not present

## 2024-02-23 DIAGNOSIS — Z961 Presence of intraocular lens: Secondary | ICD-10-CM | POA: Diagnosis not present

## 2024-02-23 DIAGNOSIS — Z8673 Personal history of transient ischemic attack (TIA), and cerebral infarction without residual deficits: Secondary | ICD-10-CM | POA: Diagnosis not present

## 2024-02-23 DIAGNOSIS — G43109 Migraine with aura, not intractable, without status migrainosus: Secondary | ICD-10-CM | POA: Diagnosis not present

## 2024-02-23 DIAGNOSIS — H35351 Cystoid macular degeneration, right eye: Secondary | ICD-10-CM | POA: Diagnosis not present

## 2024-02-27 DIAGNOSIS — G20A2 Parkinson's disease without dyskinesia, with fluctuations: Secondary | ICD-10-CM | POA: Diagnosis not present

## 2024-02-27 DIAGNOSIS — E7800 Pure hypercholesterolemia, unspecified: Secondary | ICD-10-CM | POA: Diagnosis not present

## 2024-02-27 DIAGNOSIS — Z0000 Encounter for general adult medical examination without abnormal findings: Secondary | ICD-10-CM | POA: Diagnosis not present

## 2024-02-27 DIAGNOSIS — K219 Gastro-esophageal reflux disease without esophagitis: Secondary | ICD-10-CM | POA: Diagnosis not present

## 2024-02-27 DIAGNOSIS — K5900 Constipation, unspecified: Secondary | ICD-10-CM | POA: Diagnosis not present

## 2024-02-27 DIAGNOSIS — C9110 Chronic lymphocytic leukemia of B-cell type not having achieved remission: Secondary | ICD-10-CM | POA: Diagnosis not present

## 2024-02-27 DIAGNOSIS — Z79899 Other long term (current) drug therapy: Secondary | ICD-10-CM | POA: Diagnosis not present

## 2024-03-04 NOTE — Therapy (Signed)
 [facility] [address] [place], [place], [address] Phone: [phone]   Fax:  [phone]  Patient Details  Name: Joshua Browning MRN: [id number] Date of Birth: 05/10/1946 Referring Provider:  Okey Carlin Redbird, MD  Encounter Date: 03/05/2024  Occupational Therapy Parkinson's Disease Screen  Hand dominance:  Rt handed   Physical Performance Test item #2 (simulated eating):  9.72 sec - maintained  Physical Performance Test item #4 (donning/doffing jacket):  13.77 sec - maintained  Fastening/unfastening 3 buttons in:  30 sec (maintained)   9-hole peg test:    RUE  29 sec  (maintained but would have been slightly faster - had to put one in twice d/t accidentally pulling out)      LUE  26.92 sec (faster)  Change in ability to perform ADLs/IADLs:  no changes reported   Other Comments:  BUE AROM WFL's - cues to fully extend Lt elbow   Pt does not require occupational therapy services at this time.  Recommended occupational therapy screen in 6 months     Burnard JINNY Roads, OT 03/04/2024, 1:36 PM  [facility] [address] [place], [place], [address] Phone: [phone]   Fax:  [phone]

## 2024-03-05 ENCOUNTER — Ambulatory Visit: Payer: Medicare Other | Attending: Neurology | Admitting: Occupational Therapy

## 2024-03-05 ENCOUNTER — Ambulatory Visit: Payer: Medicare Other | Admitting: Physical Therapy

## 2024-03-05 ENCOUNTER — Ambulatory Visit: Payer: Medicare Other | Admitting: Speech Pathology

## 2024-03-05 DIAGNOSIS — R471 Dysarthria and anarthria: Secondary | ICD-10-CM

## 2024-03-05 DIAGNOSIS — R278 Other lack of coordination: Secondary | ICD-10-CM | POA: Insufficient documentation

## 2024-03-05 DIAGNOSIS — R2681 Unsteadiness on feet: Secondary | ICD-10-CM

## 2024-03-05 NOTE — Therapy (Signed)
 [name] [address] [place], [place], [address] Phone: [phone]   Fax:  [phone]  Patient Details  Name: Joshua Browning MRN: [id number] Date of Birth: 12-30-1945 Referring Provider:  Okey Carlin Redbird, MD  Encounter Date: 03/05/2024  Physical Therapy Parkinson's Disease Screen   Timed Up and Go test: 10.4 seconds (previously 11.2 seconds)   10 meter walk test:12 seconds = 2.73 ft/sec (previously 2.65 ft/sec ), noted pt has some circumduction of LLE   5 time sit to stand test:10.8 seconds with some retropulsion (previously 9.8 seconds)  Has been going to the gym at [employer] and walking on the track. Does some of the machines. Also going to the [facility] moves classes consistently. And going to a balance and movement class. Not doing previous PT HEP as much as he should. No falls.   Discussed pt could benefit from a tune up to work on LLE strength and to further assess balance. Pt reports he wants to hold off on this time and will see Dr. Evonnie at the beginning of August and then decide if he follows up with therapy. Discussed performing HEP previously from therapy to work on leg strengthening with pt verbalizing understanding.   Will get pt scheduled for 6 month screens just in case.     Sheffield LOISE Senate, PT, DPT 03/05/2024, 2:06 PM  [facility] [address] [place], [place], [address] Phone: [phone]   Fax:  [phone]

## 2024-03-05 NOTE — Therapy (Signed)
 [facility] [address] [place], [place], [address] Phone: [phone]   Fax:  [phone]  Patient Details  Name: Joshua Browning MRN: [id number] Date of Birth: Mar 06, 1946 Referring Provider:  Okey Carlin Redbird, MD  Encounter Date: 03/05/2024  Speech Therapy Parkinson's Disease Screen   Decibel Level today: 72 dB  (WNL=70-72 dB) with sound level meter 30cm away from pt's mouth. Pt's conversational volume has maintained since last treatment course  Pt reports occasional with word finding, challenges with wayfinding when going unfamiliar places.Denies other cognitive deficits.   Pt denies difficulty with swallowing, which does not warrant further evaluation. He is using slow rate, take time, intentional swallow when eating.   Pt does does not require speech therapy services at this time. Recommend ST screen in another 6-8 months  Harlene LITTIE Ned, CCC-SLP 03/05/2024, 1:21 PM  [facility] [address] [place], [place], [address] Phone: [phone]   Fax:  [phone]

## 2024-03-06 ENCOUNTER — Ambulatory Visit: Attending: Cardiovascular Disease | Admitting: Cardiology

## 2024-03-06 ENCOUNTER — Encounter: Payer: Self-pay | Admitting: Cardiology

## 2024-03-06 VITALS — BP 126/72 | HR 78 | Ht 69.0 in | Wt 141.8 lb

## 2024-03-06 DIAGNOSIS — Z8673 Personal history of transient ischemic attack (TIA), and cerebral infarction without residual deficits: Secondary | ICD-10-CM

## 2024-03-06 DIAGNOSIS — R600 Localized edema: Secondary | ICD-10-CM

## 2024-03-06 DIAGNOSIS — R0609 Other forms of dyspnea: Secondary | ICD-10-CM

## 2024-03-06 MED ORDER — METOPROLOL TARTRATE 100 MG PO TABS: 100.0000 mg | ORAL_TABLET | Freq: Once | ORAL | 0 refills | Status: DC

## 2024-03-06 NOTE — Progress Notes (Signed)
 Cardiology Office Note:  .   Date:  03/06/2024  ID:  Joshua Browning, DOB 11/10/1945, MRN [id number] PCP: Okey Carlin Redbird, MD  [facility] Providers Cardiologist:  Newman Lawrence, MD PCP: Okey Carlin Redbird, MD  Chief Complaint  Patient presents with   Exertional dyspnea      History of Present Illness: .    Joshua Browning is a 78 y.o. 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  He has not had any further weight loss, but has not gained any weight either.  Continues to have bilateral leg swelling, and dyspnea on exertion while taking his trash can up the hill.  Reviewed recent echocardiogram and monitor results with the patient, details below.  Vitals:   03/06/24 0906  BP: 126/72  Pulse: 78  SpO2: 99%      ROS:  Review of Systems  Cardiovascular:  Positive for dyspnea on exertion. Negative for chest pain, leg swelling, palpitations and syncope.     Studies Reviewed: SABRA         Independently interpreted 06/2023: Chol 114, TG 53, HDL 45, LDL 57 HbA1C 5.5% Hb 13.4 Cr 0.9  Echocardiogram 09/2023:  1. Left ventricular ejection fraction, by estimation, is 60 to 65%. The  left ventricle has normal function. The left ventricle has no regional  wall motion abnormalities. Left ventricular diastolic parameters are  consistent with Grade I diastolic  dysfunction (impaired relaxation). The average left ventricular global  longitudinal strain is -20.0 %. The global longitudinal strain is normal.   2. Right ventricular systolic function is normal. The right ventricular  size is normal. There is normal pulmonary artery systolic pressure. The  estimated right ventricular systolic pressure is 23.2 mmHg.   3. The mitral valve is degenerative. No evidence of mitral valve  regurgitation.   4. The aortic valve is tricuspid. Aortic valve regurgitation is  not  visualized.   5. The inferior vena cava is normal in size with greater than 50%  respiratory variability, suggesting right atrial pressure of 3 mmHg.   Mobile cardiac outpatient telemetry 30 days 09/06/2023 - 10/05/2023: Dominant rhythm: Sinus. HR 26-122 bpm. Avg HR 71 bpm. No atrial fibrillation/atrial flutter/SVT/VT/high grade AV block, sinus pause >3sec noted. <1% PAC, PVC's noted. 3 patient triggered, 8 auto triggered events.  Sinus bradycardia with junctional rhythm onset noted on 09/22/2023 at 21: 18 EST.  No reported symptoms. Reported episodes of shortness of breath did not correlate with arrhythmia.       Regadenoson  Nuclear stress test  10/17/2022: There is a fixed mild defect in the inferior and apical regions consistent with diaphragmatic attenuation. No ischemia or scar. .  Overall LV systolic function is normal without regional wall motion abnormalities. Stress LV EF: 62%.  Non-diagnostic ECG stress. The heart rate response was consistent with Regadenoson .  No previous exam available for comparison. Low risk.   Carotid duplex 09/2022: Color duplex indicates minimal homogeneous plaque, with no hemodynamically significant stenosis by duplex criteria in the extracranial cerebrovascular circulation.   MRI brain 09/2022: 1. Recent right occipital and posterior temporal cortex infarct. 2. Subacute lacunar infarct in the deep left cerebral white matter. 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 3. Chronic cervical adenopathy also seen on neck CT from 2022, question low-grade lymphoma such as CLL.      Physical Exam:   Physical Exam Vitals and nursing note reviewed.  Constitutional:  General: He is not in acute distress.    Appearance: He is underweight.  Neck:     Vascular: No JVD.  Cardiovascular:     Rate and Rhythm: Normal rate and regular rhythm.     Heart sounds: Normal heart sounds. No murmur heard. Pulmonary:     Effort: Pulmonary effort is normal.      Breath sounds: Normal breath sounds. No wheezing or rales.  Musculoskeletal:     Right lower leg: Edema (1+, prominent varicosities) present.     Left lower leg: Edema (1+, prominent varicosities) present.      VISIT DIAGNOSES:   ICD-10-CM   1. Dyspnea on exertion  R06.09 Basic metabolic panel with GFR    2. H/O: stroke  Z86.73     3. Leg edema  R60.0         ASSESSMENT AND PLAN: .    Joshua Browning is a 78 y.o. 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  Exertional dyspnea not clearly explained by echocardiographic findings.  Stress test was also unremarkable in 10/2022, when checked for similar symptoms.  I recommend coronary CT angiogram to complete cardiac workup for etiology of exertional dyspnea, to rule out anginal equivalent as a cause.  Stroke: Multifocal stroke, embolic cause was suspected.  Prior workup with carotid duplex, echocardiogram bubble study did not show any clear etiology.  2 monitors year apart not showing A-fib.  It did show asymptomatic episodes of sinus bradycardia, but do not explain etiology for his stroke.  Patient is currently wearing a Fitbit.  I discussed loop recorder placement, but patient would like to avoid this.  Leg edema: Most likely due to venous insufficiency, he does have significant varicose veins and spider veins on bilateral legs.  Recommend wearing compression stockings regularly, keep leg elevated at night.  If symptoms do not improve, could consider formal venous duplex evaluation.  F/u as needed unless any significant abnormalities noted on coronary CTA.  Signed, Newman JINNY Lawrence, MD

## 2024-03-06 NOTE — Patient Instructions (Addendum)
 Lab Work: Bmp   If you have labs (blood work) drawn today and your tests are completely normal, you will receive your results only by: MyChart Message (if you have MyChart) OR A paper copy in the mail If you have any lab test that is abnormal or we need to change your treatment, we will call you to review the results.  Testing/Procedures: Coronary CTA   Your physician has requested that you have cardiac CT. Cardiac computed tomography (CT) is a painless test that uses an x-ray machine to take clear, detailed pictures of your heart. For further information please visit [email or web address] Please follow instruction sheet as given.    Follow-Up: At [facility], you and your health needs are our priority.  As part of our continuing mission to provide you with exceptional heart care, our providers are all part of one team.  This team includes your primary Cardiologist (physician) and Advanced Practice Providers or APPs (Physician Assistants and Nurse Practitioners) who all work together to provide you with the care you need, when you need it.  Your next appointment:   As needed   Provider:   Newman JINNY Lawrence, MD       Your cardiac CT will be scheduled at one of the below locations:   [facility] [address] [place], [place] [address] [phone]  OR   [facility] [address] [place], [place] [address] [phone]  OR   [facility] [address] [place], [place] [address] [phone]  OR   [facility] [address]  [place], [place] [address]  OR   [facility] [address] [place], [place]  If scheduled at [facility], please arrive at the [facility] and Children's Entrance (Entrance C2) of [facility] 30 minutes prior to test start time. You can use the FREE valet parking offered at entrance C (encouraged to control the heart rate for  the test)  Proceed to the [facility] Radiology Department (first floor) to check-in and test prep.  All radiology patients and guests should use entrance C2 at [facility], accessed from [facility], even though the hospital's physical address listed is [address].  If scheduled at the Heart and Vascular Tower at [address], please enter the parking lot using the [address] entrance and use the FREE valet service at the patient drop-off area. Enter the buidling and check-in with registration on the main floor.  If scheduled at [facility] or [facility], please arrive 15 mins early for check-in and test prep.  There is spacious parking and easy access to the radiology department from the [facility] Heart and Vascular entrance. Please enter here and check-in with the desk attendant.   If scheduled at [facility], please arrive 30 minutes early for check-in and test prep.  Please follow these instructions carefully (unless otherwise directed):  An IV will be required for this test and Nitroglycerin will be given.  Hold all erectile dysfunction medications at least 3 days (72 hrs) prior to test. (Ie viagra, cialis, sildenafil, tadalafil, etc)   On the Night Before the Test: Be sure to Drink plenty of water . Do not consume any caffeinated/decaffeinated beverages or chocolate 12 hours prior to your test. Do not take any antihistamines 12 hours prior to your test.  On the Day of the Test: Drink plenty of water  until  1 hour prior to the test. Do not eat any food 1 hour prior to test. You may take your regular medications prior to the test.  Take metoprolol  (Lopressor ) two hours prior to test. If you take Furosemide/Hydrochlorothiazide/Spironolactone/Chlorthalidone, please HOLD on the morning of the test. Patients who wear a continuous glucose monitor MUST remove the device prior to scanning.      After  the Test: Drink plenty of water . After receiving IV contrast, you may experience a mild flushed feeling. This is normal. On occasion, you may experience a mild rash up to 24 hours after the test. This is not dangerous. If this occurs, you can take Benadryl 25 mg, Zyrtec, Claritin, or Allegra and increase your fluid intake. (Patients taking Tikosyn should avoid Benadryl, and may take Zyrtec, Claritin, or Allegra) If you experience trouble breathing, this can be serious. If it is severe call 911 IMMEDIATELY. If it is mild, please call our office.  We will call to schedule your test 2-4 weeks out understanding that some insurance companies will need an authorization prior to the service being performed.   For more information and frequently asked questions, please visit our website : [email or web address]  For non-scheduling related questions, please contact the cardiac imaging nurse navigator should you have any questions/concerns: Cardiac Imaging Nurse Navigators Direct Office Dial: [phone]   For scheduling needs, including cancellations and rescheduling, please call [place], [phone].

## 2024-03-07 LAB — BASIC METABOLIC PANEL WITH GFR
BUN/Creatinine Ratio: 18 (ref 10–24)
BUN: 16 mg/dL (ref 8–27)
CO2: 23 mmol/L (ref 20–29)
Calcium: 9.4 mg/dL (ref 8.6–10.2)
Chloride: 105 mmol/L (ref 96–106)
Creatinine, Ser: 0.88 mg/dL (ref 0.76–1.27)
Glucose: 82 mg/dL (ref 70–99)
Potassium: 4.6 mmol/L (ref 3.5–5.2)
Sodium: 145 mmol/L — ABNORMAL HIGH (ref 134–144)
eGFR: 88 mL/min/1.73 (ref >59)

## 2024-03-08 ENCOUNTER — Ambulatory Visit: Payer: Self-pay | Admitting: Cardiology

## 2024-03-13 ENCOUNTER — Encounter (HOSPITAL_COMMUNITY): Payer: Self-pay

## 2024-03-14 DIAGNOSIS — Z961 Presence of intraocular lens: Secondary | ICD-10-CM | POA: Diagnosis not present

## 2024-03-14 DIAGNOSIS — H35351 Cystoid macular degeneration, right eye: Secondary | ICD-10-CM | POA: Diagnosis not present

## 2024-03-14 NOTE — Progress Notes (Signed)
 Assessment/Plan:   1.  Parkinsons Disease   -Continue carbidopa /levodopa  25/100, 2 tablets 3 times per day.    -Continue carbidopa /levodopa  50/200 at bedtime  -Continue clonazepam  0.5 mg, half tablet 30 mins prior to bedtime for cramping and sleep.    -he is getting back to exercise  2.  MCI   -Patient had neurocognitive testing with Dr. Judeen in 2017.  Repeat testing in 10/2022 demonstrated MCI again, although potentially a bit worse (but he had had a stroke not long prior).  Wife notes memory struggles but discussed that AChI are not for MCI.  They asked about worsening memory and discussed acetycholinesterase inhibs.  Discussed r/b/se.  He would like to trial.  Start donepezil  5 mg daily x 1 month and then 10 mg thereafter.  -OT driving eval in 10/7973 at [facility] was good   3.  Mild depression/insomnia  -Doing well with mirtazapine , 15 mg at bedtime.  Refill today.  4.  B12 deficiency  -On oral supplementation  5.  Diplopia  -This was due to dislocated intraocular lens implant.  Resolved after sx  6.  CLL  -Diagnosed September, 2022.  -Following with oncology.  7.  Basal cell carcinoma  -Following with dermatology.  8.  Ankle/feet paresthesias  -EMG negative for large fiber neuropathy.  -MRI lumbar spine with evidence of left S1 radiculopathy.  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  Patient without back pain.  10.  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  -Talked about importance of blood pressure control with a goal <130/80 mm Hg.   - On Lipitor   - Cardiology has discussed a loop recorder, but declined for now.     Subjective:   Joshua Browning was seen today in follow up for Parkinsons disease.  My previous records were reviewed prior to todays visit as well as outside records available to me.  Patient is with his wife who  helps to supplement the history.  Parkinsons wise, I am good.  Patient recently did physical, occupational, speech therapy screens and did well with those.  Therapies were not recommended at this point in time.  He has not had falls.  No near syncope.  He has continued decreased appetite.    He saw cardiology July 23 and those notes are reviewed. His coronary calcium  score is high at 957.   He continues to have some dyspnea on exertion.  He has been following with ophthalmology for retinal lesion - he was put on predforte and efudex and it helped.  He is exercising (less so in July).  Memory is more troublesome.  Has trouble with names and words.  Current prescribed movement disorder medications: Carbidopa /levodopa  25/100, 2 tablets 3 times per day  Carbidopa /levodopa  50/200 at bedtime Mirtazapine , 15 mg at bedtime Clonazepam  0.5 mg, half a tablet at bedtime  B12 supplement    ALLERGIES:  No Known Allergies  CURRENT MEDICATIONS:  Outpatient Encounter Medications as of 03/19/2024  Medication Sig   acetaminophen  (TYLENOL ) 500 MG tablet Take 1 tablet (500 mg total) by mouth every 6 (six) hours as needed.   aspirin EC 81 MG tablet Take 81 mg by mouth daily. Swallow whole.   atorvastatin  (LIPITOR) 20 MG tablet Take 1 tablet (20 mg total) by mouth daily.   carbidopa -levodopa  (SINEMET  CR) 50-200 MG tablet TAKE 1 TABLET BY MOUTH EVERYDAY AT BEDTIME   carbidopa -levodopa  (  SINEMET  IR) 25-100 MG tablet TAKE 2 TABLETS BY MOUTH 3 TIMES A DAY   Cholecalciferol (VITAMIN D3) 1000 UNITS CAPS Take 1 capsule by mouth every evening.   clonazePAM  (KLONOPIN ) 0.5 MG tablet TAKE 1/2 TABLET BY MOUTH EVERY DAY AT BEDTIME   cyanocobalamin  1000 MCG tablet Take 1,000 mcg by mouth daily.   famotidine (PEPCID) 20 MG tablet Take 20 mg by mouth at bedtime. Takes pepcid ac   Glucosamine Sulfate 1000 MG CAPS Take by mouth daily.    Magnesium 100 MG CAPS Take 1 capsule by mouth every evening.   metoprolol  tartrate (LOPRESSOR )  100 MG tablet Take 1 tablet (100 mg total) by mouth once for 1 dose. Take 90-120 minutes prior to scan. Hold for SBP less than 110.   mirtazapine  (REMERON ) 15 MG tablet TAKE 1 TABLET BY MOUTH EVERYDAY AT BEDTIME   Potassium 99 MG TABS Take by mouth daily.   No facility-administered encounter medications on file as of 03/19/2024.    Objective:   PHYSICAL EXAMINATION:    VITALS:   Vitals:   03/19/24 1522  BP: 124/76  Pulse: 61  SpO2: 96%  Weight: 138 lb (62.6 kg)  Height: 5' 10 (1.778 m)     Wt Readings from Last 3 Encounters:  03/19/24 138 lb (62.6 kg)  03/06/24 141 lb 12.8 oz (64.3 kg)  09/19/23 140 lb (63.5 kg)   GEN:  The patient appears stated age and is in NAD. HEENT:  Normocephalic, atraumatic.  The mucous membranes are moist.  Cv:  rrr Lungs:  ctab   Neurological examination:   Orientation: The patient is alert and oriented x3.   Cranial nerves: There is good facial symmetry with minimal facial hypomimia. The speech is fluent and clear. Soft palate rises symmetrically and there is no tongue deviation. Hearing is intact to conversational tone. Sensation: Sensation is intact to light touch throughout.   Motor: Strength is at least antigravity x4.     Movement examination: Tone: There is nl tone in the bilateral UE Abnormal movements: rare tremor of the L thumb Coordination:  There is no decremation with any form of RAMS, including alternating supination and pronation of the forearm, hand opening and closing, finger taps, heel taps and toe taps.  Gait and Station: The patient pushes off to arise.  He is a bit unbalanced.  He flexes at the knees.  This is similar to previous  I have reviewed and interpreted the following labs independently    Chemistry      Component Value Date/Time   NA 145 (H) 03/06/2024 1017   K 4.6 03/06/2024 1017   CL 105 03/06/2024 1017   CO2 23 03/06/2024 1017   BUN 16 03/06/2024 1017   CREATININE 0.88 03/06/2024 1017   CREATININE 0.89  05/13/2021 1512   CREATININE 0.86 06/25/2019 1049      Component Value Date/Time   CALCIUM  9.4 03/06/2024 1017   ALKPHOS 46 09/09/2022 0900   AST 15 09/09/2022 0900   AST 16 05/13/2021 1512   ALT 4 09/09/2022 0900   ALT 5 05/13/2021 1512   BILITOT 0.6 09/09/2022 0900   BILITOT 0.5 05/13/2021 1512       Lab Results  Component Value Date   WBC 8.6 08/01/2023   HGB 13.4 08/01/2023   HCT 40.2 08/01/2023   MCV 94.1 08/01/2023   PLT 154 08/01/2023    Lab Results  Component Value Date   TSH 1.49 06/25/2019   Lab Results  Component  Value Date   VITAMINB12 >1500 (H) 09/09/2022     Cc:  Okey Carlin Redbird, MD

## 2024-03-15 ENCOUNTER — Telehealth (HOSPITAL_COMMUNITY): Payer: Self-pay | Admitting: Emergency Medicine

## 2024-03-15 NOTE — Telephone Encounter (Signed)
 Attempted to call patient regarding upcoming cardiac CT appointment. Left message on voicemail with name and callback number Joshua Alexandria RN Navigator Cardiac Imaging [facility] [phone] Office [phone] Cell

## 2024-03-15 NOTE — Telephone Encounter (Signed)
 Reaching out to patient to offer assistance regarding upcoming cardiac imaging study; pt verbalizes understanding of appt date/time, parking situation and where to check in, pre-test NPO status and medications ordered, and verified current allergies; name and call back number provided for further questions should they arise Rockwell Alexandria RN Navigator Cardiac Imaging [facility] [phone] office [phone] cell

## 2024-03-18 ENCOUNTER — Ambulatory Visit (HOSPITAL_COMMUNITY)
Admission: RE | Admit: 2024-03-18 | Discharge: 2024-03-18 | Disposition: A | Discharge status: Home / Self Care | Source: Ambulatory Visit | Attending: Cardiology | Admitting: Cardiology

## 2024-03-18 VITALS — BP 117/74 | HR 60

## 2024-03-18 DIAGNOSIS — R918 Other nonspecific abnormal finding of lung field: Secondary | ICD-10-CM | POA: Insufficient documentation

## 2024-03-18 DIAGNOSIS — R931 Abnormal findings on diagnostic imaging of heart and coronary circulation: Secondary | ICD-10-CM | POA: Insufficient documentation

## 2024-03-18 DIAGNOSIS — J479 Bronchiectasis, uncomplicated: Secondary | ICD-10-CM | POA: Diagnosis not present

## 2024-03-18 DIAGNOSIS — I2510 Atherosclerotic heart disease of native coronary artery without angina pectoris: Secondary | ICD-10-CM | POA: Insufficient documentation

## 2024-03-18 DIAGNOSIS — R079 Chest pain, unspecified: Secondary | ICD-10-CM | POA: Diagnosis not present

## 2024-03-18 DIAGNOSIS — R0609 Other forms of dyspnea: Secondary | ICD-10-CM | POA: Insufficient documentation

## 2024-03-18 MED ORDER — NITROGLYCERIN 0.4 MG SL SUBL
0.8000 mg | SUBLINGUAL_TABLET | Freq: Once | SUBLINGUAL | Status: AC
  Administered 2024-03-18: 0.8 mg via SUBLINGUAL

## 2024-03-18 MED ORDER — IOHEXOL 350 MG/ML SOLN
100.0000 mL | Freq: Once | INTRAVENOUS | Status: AC | PRN
  Administered 2024-03-18: 100 mL via INTRAVENOUS

## 2024-03-19 ENCOUNTER — Ambulatory Visit (HOSPITAL_COMMUNITY)
Admission: RE | Admit: 2024-03-19 | Discharge: 2024-03-19 | Disposition: A | Discharge status: Home / Self Care | Source: Ambulatory Visit | Attending: Cardiology | Admitting: Cardiology

## 2024-03-19 ENCOUNTER — Ambulatory Visit: Payer: Medicare Other | Admitting: Neurology

## 2024-03-19 ENCOUNTER — Encounter: Payer: Self-pay | Admitting: Neurology

## 2024-03-19 VITALS — BP 124/76 | HR 61 | Ht 70.0 in | Wt 138.0 lb

## 2024-03-19 DIAGNOSIS — R0609 Other forms of dyspnea: Secondary | ICD-10-CM | POA: Diagnosis not present

## 2024-03-19 DIAGNOSIS — I2510 Atherosclerotic heart disease of native coronary artery without angina pectoris: Secondary | ICD-10-CM | POA: Diagnosis not present

## 2024-03-19 DIAGNOSIS — R079 Chest pain, unspecified: Secondary | ICD-10-CM | POA: Diagnosis not present

## 2024-03-19 DIAGNOSIS — G20A1 Parkinson's disease without dyskinesia, without mention of fluctuations: Secondary | ICD-10-CM | POA: Diagnosis not present

## 2024-03-19 DIAGNOSIS — R931 Abnormal findings on diagnostic imaging of heart and coronary circulation: Secondary | ICD-10-CM | POA: Diagnosis not present

## 2024-03-19 DIAGNOSIS — J479 Bronchiectasis, uncomplicated: Secondary | ICD-10-CM | POA: Diagnosis not present

## 2024-03-19 DIAGNOSIS — R413 Other amnesia: Secondary | ICD-10-CM | POA: Diagnosis not present

## 2024-03-19 DIAGNOSIS — R918 Other nonspecific abnormal finding of lung field: Secondary | ICD-10-CM | POA: Diagnosis not present

## 2024-03-19 MED ORDER — DONEPEZIL HCL 10 MG PO TABS: 10.0000 mg | ORAL_TABLET | Freq: Every day | ORAL | 1 refills | Status: DC

## 2024-03-19 MED ORDER — DONEPEZIL HCL 5 MG PO TABS: 5.0000 mg | ORAL_TABLET | Freq: Every day | ORAL | 0 refills | Status: DC

## 2024-03-19 NOTE — Progress Notes (Signed)
 Significant coronary calcification, without any obstructive coronary artery disease.  Continue aspirin and Lipitor, consider increasing to 40 mg daily.  LDL lower the better.  In future, prescription can be refilled by PCP.

## 2024-03-19 NOTE — Patient Instructions (Signed)

## 2024-03-20 NOTE — Telephone Encounter (Signed)
 FYI

## 2024-03-20 NOTE — Telephone Encounter (Signed)
 Sounds good.  Thanks MJP

## 2024-04-12 ENCOUNTER — Other Ambulatory Visit: Payer: Self-pay | Admitting: Neurology

## 2024-04-12 DIAGNOSIS — R413 Other amnesia: Secondary | ICD-10-CM

## 2024-04-12 DIAGNOSIS — G20A2 Parkinson's disease without dyskinesia, with fluctuations: Secondary | ICD-10-CM

## 2024-05-01 ENCOUNTER — Other Ambulatory Visit: Payer: Self-pay | Admitting: Neurology

## 2024-05-01 DIAGNOSIS — G20A1 Parkinson's disease without dyskinesia, without mention of fluctuations: Secondary | ICD-10-CM

## 2024-05-01 DIAGNOSIS — R251 Tremor, unspecified: Secondary | ICD-10-CM

## 2024-05-02 ENCOUNTER — Telehealth: Payer: Self-pay | Admitting: Oncology

## 2024-05-02 ENCOUNTER — Inpatient Hospital Stay: Payer: Medicare Other

## 2024-05-02 ENCOUNTER — Inpatient Hospital Stay: Payer: Medicare Other | Admitting: Oncology

## 2024-05-03 DIAGNOSIS — Z681 Body mass index (BMI) 19 or less, adult: Secondary | ICD-10-CM | POA: Diagnosis not present

## 2024-05-03 DIAGNOSIS — R634 Abnormal weight loss: Secondary | ICD-10-CM | POA: Diagnosis not present

## 2024-05-03 DIAGNOSIS — G20A2 Parkinson's disease without dyskinesia, with fluctuations: Secondary | ICD-10-CM | POA: Diagnosis not present

## 2024-05-03 DIAGNOSIS — E7800 Pure hypercholesterolemia, unspecified: Secondary | ICD-10-CM | POA: Diagnosis not present

## 2024-05-03 DIAGNOSIS — E46 Unspecified protein-calorie malnutrition: Secondary | ICD-10-CM | POA: Diagnosis not present

## 2024-05-03 DIAGNOSIS — R1110 Vomiting, unspecified: Secondary | ICD-10-CM | POA: Diagnosis not present

## 2024-05-03 DIAGNOSIS — R0982 Postnasal drip: Secondary | ICD-10-CM | POA: Diagnosis not present

## 2024-05-04 ENCOUNTER — Other Ambulatory Visit: Payer: Self-pay | Admitting: Neurology

## 2024-05-04 DIAGNOSIS — R251 Tremor, unspecified: Secondary | ICD-10-CM

## 2024-05-04 DIAGNOSIS — G20A1 Parkinson's disease without dyskinesia, without mention of fluctuations: Secondary | ICD-10-CM

## 2024-05-04 DIAGNOSIS — G2581 Restless legs syndrome: Secondary | ICD-10-CM

## 2024-05-06 ENCOUNTER — Telehealth: Payer: Self-pay | Admitting: Oncology

## 2024-05-06 ENCOUNTER — Ambulatory Visit (HOSPITAL_BASED_OUTPATIENT_CLINIC_OR_DEPARTMENT_OTHER)
Admission: RE | Admit: 2024-05-06 | Discharge: 2024-05-06 | Disposition: A | Discharge status: Home / Self Care | Source: Ambulatory Visit | Attending: Family Medicine | Admitting: Family Medicine

## 2024-05-06 ENCOUNTER — Other Ambulatory Visit (HOSPITAL_BASED_OUTPATIENT_CLINIC_OR_DEPARTMENT_OTHER): Payer: Self-pay | Admitting: Family Medicine

## 2024-05-06 DIAGNOSIS — R1110 Vomiting, unspecified: Secondary | ICD-10-CM

## 2024-05-06 NOTE — Telephone Encounter (Signed)
 WIFE CALLED TO RESCHEDULE APPT. PT WAS SICK, DAY AND TIME CONFIRMED.

## 2024-05-07 ENCOUNTER — Inpatient Hospital Stay

## 2024-05-07 ENCOUNTER — Inpatient Hospital Stay: Attending: Oncology

## 2024-05-07 ENCOUNTER — Inpatient Hospital Stay: Admitting: Oncology

## 2024-05-07 VITALS — BP 135/78 | HR 79 | Temp 98.3°F | Resp 18 | Ht 70.0 in | Wt 134.8 lb

## 2024-05-07 DIAGNOSIS — G20A1 Parkinson's disease without dyskinesia, without mention of fluctuations: Secondary | ICD-10-CM | POA: Diagnosis not present

## 2024-05-07 DIAGNOSIS — R1110 Vomiting, unspecified: Secondary | ICD-10-CM | POA: Insufficient documentation

## 2024-05-07 DIAGNOSIS — C9110 Chronic lymphocytic leukemia of B-cell type not having achieved remission: Secondary | ICD-10-CM

## 2024-05-07 DIAGNOSIS — Z23 Encounter for immunization: Secondary | ICD-10-CM | POA: Insufficient documentation

## 2024-05-07 LAB — CBC WITH DIFFERENTIAL (CANCER CENTER ONLY)
Abs Immature Granulocytes: 0.02 K/uL (ref 0.00–0.07)
Basophils Absolute: 0.1 K/uL (ref 0.0–0.1)
Basophils Relative: 1 %
Eosinophils Absolute: 0.2 K/uL (ref 0.0–0.5)
Eosinophils Relative: 2 %
HCT: 42.8 % (ref 39.0–52.0)
Hemoglobin: 14.2 g/dL (ref 13.0–17.0)
Immature Granulocytes: 0 %
Lymphocytes Relative: 61 %
Lymphs Abs: 6.8 K/uL — ABNORMAL HIGH (ref 0.7–4.0)
MCH: 31.3 pg (ref 26.0–34.0)
MCHC: 33.2 g/dL (ref 30.0–36.0)
MCV: 94.5 fL (ref 80.0–100.0)
Monocytes Absolute: 0.6 K/uL (ref 0.1–1.0)
Monocytes Relative: 6 %
Neutro Abs: 3.4 K/uL (ref 1.7–7.7)
Neutrophils Relative %: 30 %
Platelet Count: 156 K/uL (ref 150–400)
RBC: 4.53 MIL/uL (ref 4.22–5.81)
RDW: 13.3 % (ref 11.5–15.5)
WBC Count: 11.1 K/uL — ABNORMAL HIGH (ref 4.0–10.5)
nRBC: 0 % (ref 0.0–0.2)

## 2024-05-07 MED ORDER — INFLUENZA VAC SPLIT HIGH-DOSE 0.5 ML IM SUSY
0.5000 mL | PREFILLED_SYRINGE | Freq: Once | INTRAMUSCULAR | Status: AC
  Administered 2024-05-07: 0.5 mL via INTRAMUSCULAR
  Filled 2024-05-07: qty 0.5

## 2024-05-07 NOTE — Progress Notes (Signed)
  [facility] OFFICE PROGRESS NOTE   Diagnosis: Browning  INTERVAL HISTORY:  Joshua Browning reports intermittent episodes of vomiting since July.  He reports these episodes generally occur in the morning after eating.  They sometimes happen minutes after eating.  He saw Dr. Okey and is scheduled for an abdominal ultrasound.  He is scheduled to see Dr. Rosalie. His wife reports he had a normal chemistry panel at [facility] medicine last week. No fever, night sweats, or recent infections.  He has a good appetite.  Objective:  Vital signs in last 24 hours:  Blood pressure 135/78, pulse 79, temperature 98.3 F (36.8 C), temperature source Temporal, resp. rate 18, height 5' 10 (1.778 m), weight 134 lb 12.8 oz (61.1 kg), SpO2 100%.  Lymphatics: 1/2-1 cm soft mobile bilateral posterior cervical/scalene and axillary nodes.  1/2 cm submental node.  No inguinal nodes. Resp: Lungs clear bilaterally Cardio: Regular rate and rhythm GI: No hepatosplenomegaly, no mass Vascular: No leg edema   Lab Results:  Lab Results  Component Value Date   WBC 11.1 (H) 05/07/2024   HGB 14.2 05/07/2024   HCT 42.8 05/07/2024   MCV 94.5 05/07/2024   PLT 156 05/07/2024   NEUTROABS 3.4 05/07/2024    CMP  Lab Results  Component Value Date   NA 145 (H) 03/06/2024   K 4.6 03/06/2024   CL 105 03/06/2024   CO2 23 03/06/2024   GLUCOSE 82 03/06/2024   BUN 16 03/06/2024   CREATININE 0.88 03/06/2024   CALCIUM  9.4 03/06/2024   PROT 6.8 09/09/2022   ALBUMIN 4.5 09/09/2022   AST 15 09/09/2022   ALT 4 09/09/2022   ALKPHOS 46 09/09/2022   BILITOT 0.6 09/09/2022   GFRNONAA >60 03/29/2023    Medications: I have reviewed the patient's current medications.   Assessment/Plan: Browning Small bilateral cervical and axillary lymph nodes CT neck 04/29/2021-subcentimeter but abnormally numerous bilateral cervical lymph nodes.  Largest node 8 to 9 mm.  Small bilateral axillary lymph nodes.  Stable mediastinal lymph nodes  appear relatively normal. 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  He is asymptomatic from the Browning.  There is no indication for treating the Browning.  He will return for an office and lab visit in 9 months.  He will remain up-to-date on influenza, COVID-19, and pneumonia vaccines.  He received an influenza vaccine today.  Joshua Browning continue follow-up with Dr. Okey and Dr. Rosalie for evaluation of the intermittent emesis.  I doubt the GI symptoms are related to the Browning diagnosis.  I am available to see him as needed.  Arley Hof, MD  05/07/2024  8:43 AM

## 2024-05-08 ENCOUNTER — Ambulatory Visit (HOSPITAL_BASED_OUTPATIENT_CLINIC_OR_DEPARTMENT_OTHER)
Admission: RE | Admit: 2024-05-08 | Discharge: 2024-05-08 | Disposition: A | Discharge status: Home / Self Care | Source: Ambulatory Visit | Attending: Family Medicine | Admitting: Family Medicine

## 2024-05-08 DIAGNOSIS — N1330 Unspecified hydronephrosis: Secondary | ICD-10-CM | POA: Diagnosis not present

## 2024-05-08 DIAGNOSIS — R1110 Vomiting, unspecified: Secondary | ICD-10-CM | POA: Insufficient documentation

## 2024-05-08 DIAGNOSIS — K828 Other specified diseases of gallbladder: Secondary | ICD-10-CM | POA: Diagnosis not present

## 2024-05-08 DIAGNOSIS — N281 Cyst of kidney, acquired: Secondary | ICD-10-CM | POA: Diagnosis not present

## 2024-05-08 DIAGNOSIS — K8010 Calculus of gallbladder with chronic cholecystitis without obstruction: Secondary | ICD-10-CM | POA: Diagnosis not present

## 2024-05-14 ENCOUNTER — Other Ambulatory Visit (HOSPITAL_COMMUNITY): Payer: Self-pay | Admitting: Family Medicine

## 2024-05-14 DIAGNOSIS — R9389 Abnormal findings on diagnostic imaging of other specified body structures: Secondary | ICD-10-CM

## 2024-05-20 ENCOUNTER — Encounter: Payer: Self-pay | Admitting: Family Medicine

## 2024-05-21 ENCOUNTER — Encounter (HOSPITAL_COMMUNITY)
Admission: RE | Admit: 2024-05-21 | Discharge: 2024-05-21 | Disposition: A | Discharge status: Home / Self Care | Source: Ambulatory Visit | Attending: Family Medicine | Admitting: Family Medicine

## 2024-05-21 ENCOUNTER — Encounter (HOSPITAL_COMMUNITY): Payer: Self-pay

## 2024-05-21 DIAGNOSIS — R109 Unspecified abdominal pain: Secondary | ICD-10-CM | POA: Diagnosis not present

## 2024-05-21 DIAGNOSIS — R9389 Abnormal findings on diagnostic imaging of other specified body structures: Secondary | ICD-10-CM | POA: Diagnosis not present

## 2024-05-21 MED ORDER — TECHNETIUM TC 99M MEBROFENIN IV KIT
5.1900 | PACK | Freq: Once | INTRAVENOUS | Status: AC
  Administered 2024-05-21: 5.19 via INTRAVENOUS

## 2024-05-23 ENCOUNTER — Other Ambulatory Visit: Payer: Self-pay | Admitting: Neurology

## 2024-05-23 DIAGNOSIS — G20A2 Parkinson's disease without dyskinesia, with fluctuations: Secondary | ICD-10-CM

## 2024-05-27 ENCOUNTER — Other Ambulatory Visit: Payer: Self-pay | Admitting: Neurology

## 2024-05-27 DIAGNOSIS — G2581 Restless legs syndrome: Secondary | ICD-10-CM

## 2024-05-27 DIAGNOSIS — G20A2 Parkinson's disease without dyskinesia, with fluctuations: Secondary | ICD-10-CM

## 2024-05-27 DIAGNOSIS — G20A1 Parkinson's disease without dyskinesia, without mention of fluctuations: Secondary | ICD-10-CM

## 2024-05-29 ENCOUNTER — Other Ambulatory Visit: Payer: Self-pay | Admitting: Gastroenterology

## 2024-05-29 DIAGNOSIS — R634 Abnormal weight loss: Secondary | ICD-10-CM

## 2024-05-29 DIAGNOSIS — R1114 Bilious vomiting: Secondary | ICD-10-CM | POA: Diagnosis not present

## 2024-06-06 ENCOUNTER — Other Ambulatory Visit (HOSPITAL_BASED_OUTPATIENT_CLINIC_OR_DEPARTMENT_OTHER): Payer: Self-pay

## 2024-06-06 MED ORDER — COMIRNATY 30 MCG/0.3ML IM SUSY
0.3000 mL | PREFILLED_SYRINGE | Freq: Once | INTRAMUSCULAR | 0 refills | Status: AC
  Filled 2024-06-06: qty 0.3, 1d supply, fill #0

## 2024-06-07 ENCOUNTER — Ambulatory Visit
Admission: RE | Admit: 2024-06-07 | Discharge: 2024-06-07 | Disposition: A | Discharge status: Home / Self Care | Source: Ambulatory Visit | Attending: Gastroenterology | Admitting: Gastroenterology

## 2024-06-07 DIAGNOSIS — K5730 Diverticulosis of large intestine without perforation or abscess without bleeding: Secondary | ICD-10-CM | POA: Diagnosis not present

## 2024-06-07 DIAGNOSIS — R634 Abnormal weight loss: Secondary | ICD-10-CM

## 2024-06-07 DIAGNOSIS — K4090 Unilateral inguinal hernia, without obstruction or gangrene, not specified as recurrent: Secondary | ICD-10-CM | POA: Diagnosis not present

## 2024-06-07 MED ORDER — IOPAMIDOL (ISOVUE-370) INJECTION 76%
80.0000 mL | Freq: Once | INTRAVENOUS | Status: AC | PRN
  Administered 2024-06-07: 80 mL via INTRAVENOUS

## 2024-07-28 ENCOUNTER — Other Ambulatory Visit: Payer: Self-pay | Admitting: Neurology

## 2024-07-28 DIAGNOSIS — R251 Tremor, unspecified: Secondary | ICD-10-CM

## 2024-07-28 DIAGNOSIS — G20A1 Parkinson's disease without dyskinesia, without mention of fluctuations: Secondary | ICD-10-CM

## 2024-08-08 ENCOUNTER — Other Ambulatory Visit: Payer: Self-pay | Admitting: Neurology

## 2024-08-08 DIAGNOSIS — G20A1 Parkinson's disease without dyskinesia, without mention of fluctuations: Secondary | ICD-10-CM

## 2024-08-08 DIAGNOSIS — G2581 Restless legs syndrome: Secondary | ICD-10-CM

## 2024-08-08 DIAGNOSIS — R251 Tremor, unspecified: Secondary | ICD-10-CM

## 2024-08-12 ENCOUNTER — Other Ambulatory Visit: Payer: Self-pay | Admitting: Neurology

## 2024-08-12 DIAGNOSIS — G20A2 Parkinson's disease without dyskinesia, with fluctuations: Secondary | ICD-10-CM

## 2024-08-26 ENCOUNTER — Other Ambulatory Visit: Payer: Self-pay | Admitting: Neurology

## 2024-08-26 DIAGNOSIS — G20A2 Parkinson's disease without dyskinesia, with fluctuations: Secondary | ICD-10-CM

## 2024-08-26 DIAGNOSIS — G2581 Restless legs syndrome: Secondary | ICD-10-CM

## 2024-08-26 NOTE — Telephone Encounter (Signed)
 Last filled 05/27/24 by Dr . Tobie

## 2024-09-06 ENCOUNTER — Other Ambulatory Visit: Payer: Self-pay | Admitting: Neurology

## 2024-09-10 ENCOUNTER — Ambulatory Visit: Admitting: Physical Therapy

## 2024-09-10 ENCOUNTER — Ambulatory Visit: Payer: Self-pay | Admitting: Occupational Therapy

## 2024-09-12 NOTE — Progress Notes (Signed)
 "   Assessment/Plan:   1.  Parkinsons Disease   -Continue carbidopa /levodopa  25/100, 2 tablets 3 times per day.  Encouraged to take tid dosing.  Missing middle of the day dosage.  -Continue carbidopa /levodopa  50/200 at bedtime  -Continue clonazepam  0.5 mg, half tablet 30 mins prior to bedtime for cramping and sleep.    -discussed walker today.  He is more unbalanced today  -refer to PT  2.  MCI   -Patient had neurocognitive testing with Dr. Judeen in 2017.  Repeat testing in 10/2022 demonstrated MCI again, although potentially a bit worse (but he had had a stroke not long prior).  Wife notes memory struggles but discussed that AChI are not for MCI.  They asked about worsening memory and discussed acetycholinesterase inhibs.  Discussed r/b/se.  He would like to trial.  Start donepezil  5 mg daily x 1 month and then 10 mg thereafter.  -OT driving eval in 10/7973 at [facility] was good   3.  Mild depression/insomnia  -Doing well with mirtazapine , 15 mg at bedtime.  Refill today.  4.  B12 deficiency  -On oral supplementation  5.  Diplopia  -This was due to dislocated intraocular lens implant.  Resolved after sx  6.  CLL  -Diagnosed September, 2022.  -Following with oncology.  7.  Basal cell carcinoma  -Following with dermatology.  8.  Ankle/feet paresthesias  -EMG negative for large fiber neuropathy.  -MRI lumbar spine with evidence of left S1 radiculopathy.  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  Patient without back pain.  10.  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  -Talked about importance of blood pressure control with a goal <130/80 mm Hg.   - On Lipitor   - Cardiology has discussed a loop recorder, but declined for now.  11.  Weight loss  -stable currently   Subjective:   Joshua Browning was seen today in follow up for Parkinsons  disease.  My previous records were reviewed prior to todays visit as well as outside records available to me.  Patient is with his wife who helps to supplement the history.  Patient denies falls since our last visit.  He has had no syncopal episodes.  No near syncope.  No hallucinations.  He does forget the levodopa  some.  He had a period with projectile emesis and saw GI and they saw gallstone but didn't think it was the issue b/c of nl HIDA scan.  They told him that the levodopa  may be the issue but the patient noted he never had issues in the past.  He noted he has more trouble remembering it after the meal, which is what he was told to do by GI so he is missing it more, perhaps one time per day.  He saw Dr. Rosalie.  Pt has not had the N/V in weeks to months now.  Weight has actually increased some compared to when he was sick (mostly stable from last summer)  Current prescribed movement disorder medications: Carbidopa /levodopa  25/100, 2 tablets 3 times per day  Carbidopa /levodopa  50/200 at bedtime Mirtazapine , 15 mg at bedtime Clonazepam  0.5 mg, half a tablet at bedtime  B12 supplement    ALLERGIES:  No Known Allergies  CURRENT MEDICATIONS:  Outpatient Encounter Medications as of 09/13/2024  Medication Sig   acetaminophen  (TYLENOL ) 500 MG tablet Take 1 tablet (500 mg total) by mouth every 6 (six) hours  as needed.   aspirin EC 81 MG tablet Take 81 mg by mouth daily. Swallow whole.   atorvastatin  (LIPITOR) 20 MG tablet Take 1 tablet (20 mg total) by mouth daily.   carbidopa -levodopa  (SINEMET  CR) 50-200 MG tablet TAKE 1 TABLET BY MOUTH EVERYDAY AT BEDTIME   carbidopa -levodopa  (SINEMET  IR) 25-100 MG tablet TAKE 2 TABLETS BY MOUTH 3 TIMES A DAY   Cholecalciferol (VITAMIN D3) 1000 UNITS CAPS Take 1 capsule by mouth every evening.   clonazePAM  (KLONOPIN ) 0.5 MG tablet TAKE 1/2 TABLET BY MOUTH EVERY DAY AT BEDTIME   cyanocobalamin  1000 MCG tablet Take 1,000 mcg by mouth daily.   donepezil  (ARICEPT )  10 MG tablet TAKE 1 TABLET BY MOUTH EVERYDAY AT BEDTIME   famotidine (PEPCID) 20 MG tablet Take 20 mg by mouth at bedtime. Takes pepcid ac   Glucosamine Sulfate 1000 MG CAPS Take by mouth daily.    Magnesium 100 MG CAPS Take 1 capsule by mouth every evening.   mirtazapine  (REMERON ) 15 MG tablet TAKE 1 TABLET BY MOUTH EVERYDAY AT BEDTIME   Potassium 99 MG TABS Take by mouth daily.   No facility-administered encounter medications on file as of 09/13/2024.    Objective:   PHYSICAL EXAMINATION:    VITALS:   Vitals:   09/13/24 1513  BP: 110/70  Pulse: 73  SpO2: 99%  Weight: 136 lb (61.7 kg)      Wt Readings from Last 3 Encounters:  09/13/24 136 lb (61.7 kg)  05/07/24 134 lb 12.8 oz (61.1 kg)  03/19/24 138 lb (62.6 kg)   GEN:  The patient appears stated age and is in NAD. HEENT:  Normocephalic, atraumatic.  The mucous membranes are moist.  Cv:  rrr Lungs:  ctab   Neurological examination:   Orientation: The patient is alert and oriented x3.   Cranial nerves: There is good facial symmetry with minimal facial hypomimia. The speech is fluent and clear. Soft palate rises symmetrically and there is no tongue deviation. Hearing is intact to conversational tone. Sensation: Sensation is intact to light touch throughout.   Motor: Strength is at least antigravity x4.     Movement examination: Tone: There is nl tone in the bilateral UE Abnormal movements: rare tremor of the L thumb Coordination:  There is no decremation with any form of RAMS, including alternating supination and pronation of the forearm, hand opening and closing, finger taps, heel taps and toe taps.  Gait and Station: The patient pushes off to arise and takes a few times before he gets up.  He is quite unbalanced/unsteady today (worse).  He flexes at the knees.    I have reviewed and interpreted the following labs independently    Chemistry      Component Value Date/Time   NA 145 (H) 03/06/2024 1017   K 4.6  03/06/2024 1017   CL 105 03/06/2024 1017   CO2 23 03/06/2024 1017   BUN 16 03/06/2024 1017   CREATININE 0.88 03/06/2024 1017   CREATININE 0.89 05/13/2021 1512   CREATININE 0.86 06/25/2019 1049      Component Value Date/Time   CALCIUM  9.4 03/06/2024 1017   ALKPHOS 46 09/09/2022 0900   AST 15 09/09/2022 0900   AST 16 05/13/2021 1512   ALT 4 09/09/2022 0900   ALT 5 05/13/2021 1512   BILITOT 0.6 09/09/2022 0900   BILITOT 0.5 05/13/2021 1512       Lab Results  Component Value Date   WBC 11.1 (H) 05/07/2024   HGB  14.2 05/07/2024   HCT 42.8 05/07/2024   MCV 94.5 05/07/2024   PLT 156 05/07/2024    Lab Results  Component Value Date   TSH 1.49 06/25/2019   Lab Results  Component Value Date   VITAMINB12 >1500 (H) 09/09/2022   Total time spent on today's visit was 30 minutes, including both face-to-face time and nonface-to-face time.  Time included that spent on review of records (prior notes available to me/labs/imaging if pertinent), discussing treatment and goals, answering patient's questions and coordinating care.   Cc:  Okey Carlin Redbird, MD "

## 2024-09-13 ENCOUNTER — Ambulatory Visit: Admitting: Neurology

## 2024-09-13 VITALS — BP 110/70 | HR 73 | Wt 136.0 lb

## 2024-09-13 DIAGNOSIS — G20A1 Parkinson's disease without dyskinesia, without mention of fluctuations: Secondary | ICD-10-CM

## 2024-09-13 DIAGNOSIS — R634 Abnormal weight loss: Secondary | ICD-10-CM

## 2024-09-13 NOTE — Patient Instructions (Signed)
 I want you to use your walker! I sent a referral to [facility] for PT I want you to take your carbidopa /levodopa  THREE times per day plus the bedtime dosage  The physicians and staff at [facility] are committed to providing excellent care. You may receive a survey requesting feedback about your experience at our office. We strive to receive very good responses to the survey questions. If you feel that your experience would prevent you from giving the office a very good  response, please contact our office to try to remedy the situation. We may be reached at [phone]. Thank you for taking the time out of your busy day to complete the survey.

## 2024-09-23 ENCOUNTER — Ambulatory Visit: Admitting: Physical Therapy

## 2024-10-03 ENCOUNTER — Ambulatory Visit: Admitting: Neurology

## 2024-10-08 ENCOUNTER — Ambulatory Visit: Payer: Self-pay | Admitting: Physical Therapy

## 2024-10-08 ENCOUNTER — Ambulatory Visit: Payer: Self-pay | Admitting: Occupational Therapy

## 2025-02-04 ENCOUNTER — Other Ambulatory Visit

## 2025-02-04 ENCOUNTER — Ambulatory Visit: Admitting: Oncology

## 2025-03-20 ENCOUNTER — Ambulatory Visit: Payer: Self-pay | Admitting: Neurology
